# Patient Record
Sex: Female | Born: 1945 | Race: White | Hispanic: No | Marital: Married | Smoking: Never smoker
Health system: Southern US, Community
[De-identification: ages and names within clinical notes are randomized; demographics above are authoritative.]

## PROBLEM LIST (undated history)

## (undated) DIAGNOSIS — C50919 Malignant neoplasm of unspecified site of unspecified female breast: Secondary | ICD-10-CM

## (undated) DIAGNOSIS — Z803 Family history of malignant neoplasm of breast: Secondary | ICD-10-CM

## (undated) DIAGNOSIS — K219 Gastro-esophageal reflux disease without esophagitis: Secondary | ICD-10-CM

## (undated) DIAGNOSIS — M25569 Pain in unspecified knee: Secondary | ICD-10-CM

## (undated) DIAGNOSIS — Z8042 Family history of malignant neoplasm of prostate: Secondary | ICD-10-CM

## (undated) DIAGNOSIS — E785 Hyperlipidemia, unspecified: Secondary | ICD-10-CM

## (undated) DIAGNOSIS — E039 Hypothyroidism, unspecified: Secondary | ICD-10-CM

## (undated) DIAGNOSIS — C801 Malignant (primary) neoplasm, unspecified: Secondary | ICD-10-CM

## (undated) DIAGNOSIS — E559 Vitamin D deficiency, unspecified: Secondary | ICD-10-CM

## (undated) DIAGNOSIS — C4491 Basal cell carcinoma of skin, unspecified: Secondary | ICD-10-CM

## (undated) DIAGNOSIS — H269 Unspecified cataract: Secondary | ICD-10-CM

## (undated) DIAGNOSIS — D649 Anemia, unspecified: Secondary | ICD-10-CM

## (undated) DIAGNOSIS — M545 Low back pain: Secondary | ICD-10-CM

## (undated) DIAGNOSIS — C439 Malignant melanoma of skin, unspecified: Secondary | ICD-10-CM

## (undated) DIAGNOSIS — E079 Disorder of thyroid, unspecified: Secondary | ICD-10-CM

## (undated) DIAGNOSIS — Z0000 Encounter for general adult medical examination without abnormal findings: Secondary | ICD-10-CM

## (undated) DIAGNOSIS — R112 Nausea with vomiting, unspecified: Secondary | ICD-10-CM

## (undated) DIAGNOSIS — T7840XA Allergy, unspecified, initial encounter: Secondary | ICD-10-CM

## (undated) DIAGNOSIS — Z8052 Family history of malignant neoplasm of bladder: Secondary | ICD-10-CM

## (undated) DIAGNOSIS — M5030 Other cervical disc degeneration, unspecified cervical region: Secondary | ICD-10-CM

## (undated) DIAGNOSIS — Z9889 Other specified postprocedural states: Secondary | ICD-10-CM

## (undated) DIAGNOSIS — Z800 Family history of malignant neoplasm of digestive organs: Secondary | ICD-10-CM

## (undated) DIAGNOSIS — R739 Hyperglycemia, unspecified: Secondary | ICD-10-CM

## (undated) DIAGNOSIS — Z8041 Family history of malignant neoplasm of ovary: Secondary | ICD-10-CM

## (undated) DIAGNOSIS — I10 Essential (primary) hypertension: Principal | ICD-10-CM

## (undated) DIAGNOSIS — Z124 Encounter for screening for malignant neoplasm of cervix: Secondary | ICD-10-CM

## (undated) HISTORY — DX: Family history of malignant neoplasm of digestive organs: Z80.0

## (undated) HISTORY — DX: Encounter for general adult medical examination without abnormal findings: Z00.00

## (undated) HISTORY — DX: Malignant melanoma of skin, unspecified: C43.9

## (undated) HISTORY — DX: Anemia, unspecified: D64.9

## (undated) HISTORY — DX: Family history of malignant neoplasm of prostate: Z80.42

## (undated) HISTORY — DX: Hyperlipidemia, unspecified: E78.5

## (undated) HISTORY — DX: Disorder of thyroid, unspecified: E07.9

## (undated) HISTORY — DX: Malignant neoplasm of unspecified site of unspecified female breast: C50.919

## (undated) HISTORY — DX: Pain in unspecified knee: M25.569

## (undated) HISTORY — DX: Family history of malignant neoplasm of ovary: Z80.41

## (undated) HISTORY — DX: Other cervical disc degeneration, unspecified cervical region: M50.30

## (undated) HISTORY — DX: Low back pain: M54.5

## (undated) HISTORY — DX: Malignant (primary) neoplasm, unspecified: C80.1

## (undated) HISTORY — DX: Family history of malignant neoplasm of breast: Z80.3

## (undated) HISTORY — DX: Family history of malignant neoplasm of bladder: Z80.52

## (undated) HISTORY — DX: Allergy, unspecified, initial encounter: T78.40XA

## (undated) HISTORY — DX: Hyperglycemia, unspecified: R73.9

## (undated) HISTORY — DX: Encounter for screening for malignant neoplasm of cervix: Z12.4

## (undated) HISTORY — DX: Essential (primary) hypertension: I10

## (undated) HISTORY — DX: Vitamin D deficiency, unspecified: E55.9

## (undated) HISTORY — DX: Unspecified cataract: H26.9

## (undated) HISTORY — DX: Basal cell carcinoma of skin, unspecified: C44.91

## (undated) HISTORY — DX: Gastro-esophageal reflux disease without esophagitis: K21.9

## (undated) HISTORY — PX: COLONOSCOPY: SHX174

## (undated) HISTORY — PX: BASAL CELL CARCINOMA EXCISION: SHX1214

## (undated) HISTORY — PX: ABDOMINAL HYSTERECTOMY: SHX81

## (undated) HISTORY — PX: BREAST SURGERY: SHX581

---
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
## 2000-07-18 ENCOUNTER — Other Ambulatory Visit: Admission: RE | Admit: 2000-07-18 | Discharge: 2000-07-18 | Payer: Self-pay | Admitting: Obstetrics and Gynecology

## 2000-10-12 ENCOUNTER — Other Ambulatory Visit: Admission: RE | Admit: 2000-10-12 | Discharge: 2000-10-12 | Payer: Self-pay | Admitting: Obstetrics and Gynecology

## 2000-11-18 ENCOUNTER — Other Ambulatory Visit: Admission: RE | Admit: 2000-11-18 | Discharge: 2000-11-18 | Payer: Self-pay | Admitting: Obstetrics and Gynecology

## 2001-02-14 ENCOUNTER — Other Ambulatory Visit: Admission: RE | Admit: 2001-02-14 | Discharge: 2001-02-14 | Payer: Self-pay | Admitting: Obstetrics and Gynecology

## 2001-08-01 ENCOUNTER — Other Ambulatory Visit: Admission: RE | Admit: 2001-08-01 | Discharge: 2001-08-01 | Payer: Self-pay | Admitting: Obstetrics and Gynecology

## 2001-11-21 ENCOUNTER — Other Ambulatory Visit: Admission: RE | Admit: 2001-11-21 | Discharge: 2001-11-21 | Payer: Self-pay | Admitting: Obstetrics and Gynecology

## 2002-02-22 ENCOUNTER — Other Ambulatory Visit: Admission: RE | Admit: 2002-02-22 | Discharge: 2002-02-22 | Payer: Self-pay | Admitting: Obstetrics and Gynecology

## 2002-10-11 ENCOUNTER — Other Ambulatory Visit: Admission: RE | Admit: 2002-10-11 | Discharge: 2002-10-11 | Payer: Self-pay | Admitting: Obstetrics and Gynecology

## 2004-11-18 ENCOUNTER — Other Ambulatory Visit: Admission: RE | Admit: 2004-11-18 | Discharge: 2004-11-18 | Payer: Self-pay | Admitting: Obstetrics and Gynecology

## 2005-11-08 DIAGNOSIS — C801 Malignant (primary) neoplasm, unspecified: Secondary | ICD-10-CM | POA: Insufficient documentation

## 2005-11-08 HISTORY — DX: Malignant (primary) neoplasm, unspecified: C80.1

## 2005-11-08 HISTORY — PX: MELANOMA EXCISION: SHX5266

## 2005-11-08 LAB — HM COLONOSCOPY: HM Colonoscopy: NORMAL

## 2006-08-10 ENCOUNTER — Other Ambulatory Visit: Admission: RE | Admit: 2006-08-10 | Discharge: 2006-08-10 | Payer: Self-pay | Admitting: Obstetrics and Gynecology

## 2006-09-05 ENCOUNTER — Ambulatory Visit: Payer: Self-pay | Admitting: Internal Medicine

## 2006-09-19 ENCOUNTER — Ambulatory Visit: Payer: Self-pay | Admitting: Internal Medicine

## 2009-11-08 LAB — HM PAP SMEAR: HM Pap smear: NORMAL

## 2012-03-13 LAB — HM MAMMOGRAPHY: HM Mammogram: NORMAL

## 2012-03-30 ENCOUNTER — Encounter: Payer: Self-pay | Admitting: Family Medicine

## 2012-03-30 ENCOUNTER — Ambulatory Visit (INDEPENDENT_AMBULATORY_CARE_PROVIDER_SITE_OTHER): Payer: BC Managed Care – PPO | Admitting: Family Medicine

## 2012-03-30 VITALS — BP 146/86 | HR 95 | Temp 98.0°F | Ht 60.75 in | Wt 138.8 lb

## 2012-03-30 DIAGNOSIS — K219 Gastro-esophageal reflux disease without esophagitis: Secondary | ICD-10-CM | POA: Insufficient documentation

## 2012-03-30 DIAGNOSIS — M25569 Pain in unspecified knee: Secondary | ICD-10-CM | POA: Insufficient documentation

## 2012-03-30 DIAGNOSIS — Z0000 Encounter for general adult medical examination without abnormal findings: Secondary | ICD-10-CM | POA: Insufficient documentation

## 2012-03-30 DIAGNOSIS — D649 Anemia, unspecified: Secondary | ICD-10-CM | POA: Insufficient documentation

## 2012-03-30 DIAGNOSIS — I10 Essential (primary) hypertension: Secondary | ICD-10-CM | POA: Insufficient documentation

## 2012-03-30 DIAGNOSIS — C801 Malignant (primary) neoplasm, unspecified: Secondary | ICD-10-CM | POA: Insufficient documentation

## 2012-03-30 DIAGNOSIS — E079 Disorder of thyroid, unspecified: Secondary | ICD-10-CM

## 2012-03-30 DIAGNOSIS — E785 Hyperlipidemia, unspecified: Secondary | ICD-10-CM

## 2012-03-30 DIAGNOSIS — T7840XA Allergy, unspecified, initial encounter: Secondary | ICD-10-CM | POA: Insufficient documentation

## 2012-03-30 HISTORY — DX: Pain in unspecified knee: M25.569

## 2012-03-30 NOTE — Progress Notes (Signed)
Patient ID: Tammy Grimes, female   DOB: 1945/12/09, 66 y.o.   MRN: [id number] Tammy Grimes [id number] 1946-05-21 03/30/2012      Progress Note New Patient  Subjective  Chief Complaint  Chief Complaint  Patient presents with  . Establish Care    new patient    HPI  Patient is a 66 year old Caucasian female who is in today for new patient appointment. 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 She notes blood pressures have been in the 120 130/70-90 range. She did have an eye infection in her lid earlier dysuria but that is resolved it was in the right eye. 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 She had colonoscopy back in 2007. No recent illness, fevers, chills, chest pain, palpitations, shortness of breath, GI or GU complaints are noted at today's visit.  Past Medical History  Diagnosis Date  . GERD (gastroesophageal reflux disease)   . Hypertension 66  . Cancer 66    calf of right leg- melanoma  . Anemia     prior to hysterectomy  . Allergy   . Hyperlipidemia   . Thyroid disease 66  . Preventative health care 03/30/2012  . Knee pain 03/30/2012    L>R    Past Surgical History  Procedure Date  . Breast surgery early 70's    fibroid tumors removed- benign  . Abdominal hysterectomy 1990's    total, for heavy bleeding and fibroids    Family History  Problem Relation Age of Onset  . Hypertension Mother   . Alzheimer's disease Mother   . Dementia Mother     alzheimer's  . Aortic aneurysm Father   . Hypertension Father   . COPD Father     smoker  . Heart disease Father     s/p bypass, aortic aneurysm, carotid artery disease  . Cancer Sister 33    breast- remission  . Diabetes Son 22    type 1  . Alzheimer's disease Maternal Grandmother   . Cancer Maternal Grandfather     prostate  . Stroke Paternal Grandfather     History   Social History  . Marital  Status: Married    Spouse Name: N/A    Number of Children: N/A  . Years of Education: N/A   Occupational History  . Not on file.   Social History Main Topics  . Smoking status: Never Smoker   . Smokeless tobacco: Never Used  . Alcohol Use: No  . Drug Use: No  . Sexually Active: Not Currently   Other Topics Concern  . Not on file   Social History Narrative  . No narrative on file    Current Outpatient Prescriptions on File Prior to Visit  Medication Sig Dispense Refill  . amLODipine (NORVASC) 5 MG tablet Take 5 mg by mouth daily.      Marland Kitchen esomeprazole (NEXIUM) 40 MG capsule Take 40 mg by mouth daily before breakfast.      . levothyroxine (SYNTHROID, LEVOTHROID) 75 MCG tablet Take 75 mcg by mouth daily.        Allergies  Allergen Reactions  . Penicillins Rash    Review of Systems  Review of Systems  Constitutional: Negative for fever and malaise/fatigue.  HENT: Negative for congestion.   Eyes: Negative for discharge.  Respiratory: Negative for shortness of breath.   Cardiovascular: Negative for chest pain,  palpitations and leg swelling.  Gastrointestinal: Negative for nausea, abdominal pain and diarrhea.  Genitourinary: Negative for dysuria.  Musculoskeletal: Positive for joint pain. Negative for falls.       Knee pain  Skin: Negative for rash.  Neurological: Negative for loss of consciousness and headaches.  Endo/Heme/Allergies: Negative for polydipsia.  Psychiatric/Behavioral: Negative for depression and suicidal ideas. The patient is not nervous/anxious and does not have insomnia.     Objective  BP 146/86  Pulse 95  Temp(Src) 98 F (36.7 C) (Temporal)  Ht 5' 0.75" (1.543 m)  Wt 138 lb 12.8 oz (62.959 kg)  BMI 26.44 kg/m2  SpO2 97%  Physical Exam  Physical Exam  Constitutional: She is oriented to person, place, and time and well-developed, well-nourished, and in no distress. No distress.  HENT:  Head: Normocephalic and atraumatic.  Eyes: Conjunctivae  are normal.  Neck: Neck supple. No thyromegaly present.  Cardiovascular: Normal rate, regular rhythm and normal heart sounds.   No murmur heard. Pulmonary/Chest: Effort normal and breath sounds normal. She has no wheezes.  Abdominal: She exhibits no distension and no mass.  Musculoskeletal: She exhibits no edema.  Lymphadenopathy:    She has no cervical adenopathy.  Neurological: She is alert and oriented to person, place, and time.  Skin: Skin is warm and dry. No rash noted. She is not diaphoretic.  Psychiatric: Memory, affect and judgment normal.       Assessment & Plan  Hypertension improved on repeat, given handout on DASH diet, encouraged minimal sodium and reassess at next visit.  GERD (gastroesophageal reflux disease) Well controlled on Nexium. Avoid offending foods.  Thyroid disease On Synthroid, has her lab work done at her place of employment. She agrees to supply a copy of her most recent labs so we can further evaluate.  Preventative health care Patient agrees to return for gyn visit in 2 months, encouraged to maintain adequate sleep, exercise and a heart  Healthy diet  Hyperlipidemia Check labs from patient's work place. Encouraged MegaRed daily  Cancer Follows with dermatology  Knee pain enouraged Naproxen 220 mg daily and Aspercreme bid. Call if worsens and wants a referral.

## 2012-03-30 NOTE — Patient Instructions (Addendum)
Preventive Care for Adults, Female A healthy lifestyle and preventive care can promote health and wellness. Preventive health guidelines for women include the following key practices.  A routine yearly physical is a good way to check with your caregiver about your health and preventive screening. It is a chance to share any concerns and updates on your health, and to receive a thorough exam.   Visit your dentist for a routine exam and preventive care every 6 months. Brush your teeth twice a day and floss once a day. Good oral hygiene prevents tooth decay and gum disease.   The frequency of eye exams is based on your age, health, family medical history, use of contact lenses, and other factors. Follow your caregiver's recommendations for frequency of eye exams.   Eat a healthy diet. Foods like vegetables, fruits, whole grains, low-fat dairy products, and lean protein foods contain the nutrients you need without too many calories. Decrease your intake of foods high in solid fats, added sugars, and salt. Eat the right amount of calories for you.Get information about a proper diet from your caregiver, if necessary.   Regular physical exercise is one of the most important things you can do for your health. Most adults should get at least 150 minutes of moderate-intensity exercise (any activity that increases your heart rate and causes you to sweat) each week. In addition, most adults need muscle-strengthening exercises on 2 or more days a week.   Maintain a healthy weight. The body mass index (BMI) is a screening tool to identify possible weight problems. It provides an estimate of body fat based on height and weight. Your caregiver can help determine your BMI, and can help you achieve or maintain a healthy weight.For adults 20 years and older:   A BMI below 18.5 is considered underweight.   A BMI of 18.5 to 24.9 is normal.   A BMI of 25 to 29.9 is considered overweight.   A BMI of 30 and above is  considered obese.   Maintain normal blood lipids and cholesterol levels by exercising and minimizing your intake of saturated fat. Eat a balanced diet with plenty of fruit and vegetables. Blood tests for lipids and cholesterol should begin at age 20 and be repeated every 5 years. If your lipid or cholesterol levels are high, you are over 50, or you are at high risk for heart disease, you may need your cholesterol levels checked more frequently.Ongoing high lipid and cholesterol levels should be treated with medicines if diet and exercise are not effective.   If you smoke, find out from your caregiver how to quit. If you do not use tobacco, do not start.   If you are pregnant, do not drink alcohol. If you are breastfeeding, be very cautious about drinking alcohol. If you are not pregnant and choose to drink alcohol, do not exceed 1 drink per day. One drink is considered to be 12 ounces (355 mL) of beer, 5 ounces (148 mL) of wine, or 1.5 ounces (44 mL) of liquor.   Avoid use of street drugs. Do not share needles with anyone. Ask for help if you need support or instructions about stopping the use of drugs.   High blood pressure causes heart disease and increases the risk of stroke. Your blood pressure should be checked at least every 1 to 2 years. Ongoing high blood pressure should be treated with medicines if weight loss and exercise are not effective.   If you are 55 to 64   years old, ask your caregiver if you should take aspirin to prevent strokes.   Diabetes screening involves taking a blood sample to check your fasting blood sugar level. This should be done once every 3 years, after age 45, if you are within normal weight and without risk factors for diabetes. Testing should be considered at a younger age or be carried out more frequently if you are overweight and have at least 1 risk factor for diabetes.   Breast cancer screening is essential preventive care for women. You should practice "breast  self-awareness." This means understanding the normal appearance and feel of your breasts and may include breast self-examination. Any changes detected, no matter how small, should be reported to a caregiver. Women in their 20s and 30s should have a clinical breast exam (CBE) by a caregiver as part of a regular health exam every 1 to 3 years. After age 40, women should have a CBE every year. Starting at age 40, women should consider having a mammography (breast X-ray test) every year. Women who have a family history of breast cancer should talk to their caregiver about genetic screening. Women at a high risk of breast cancer should talk to their caregivers about having magnetic resonance imaging (MRI) and a mammography every year.   The Pap test is a screening test for cervical cancer. A Pap test can show cell changes on the cervix that might become cervical cancer if left untreated. A Pap test is a procedure in which cells are obtained and examined from the lower end of the uterus (cervix).   Women should have a Pap test starting at age 21.   Between ages 21 and 29, Pap tests should be repeated every 2 years.   Beginning at age 30, you should have a Pap test every 3 years as long as the past 3 Pap tests have been normal.   Some women have medical problems that increase the chance of getting cervical cancer. Talk to your caregiver about these problems. It is especially important to talk to your caregiver if a new problem develops soon after your last Pap test. In these cases, your caregiver may recommend more frequent screening and Pap tests.   The above recommendations are the same for women who have or have not gotten the vaccine for human papillomavirus (HPV).   If you had a hysterectomy for a problem that was not cancer or a condition that could lead to cancer, then you no longer need Pap tests. Even if you no longer need a Pap test, a regular exam is a good idea to make sure no other problems are  starting.   If you are between ages 65 and 70, and you have had normal Pap tests going back 10 years, you no longer need Pap tests. Even if you no longer need a Pap test, a regular exam is a good idea to make sure no other problems are starting.   If you have had past treatment for cervical cancer or a condition that could lead to cancer, you need Pap tests and screening for cancer for at least 20 years after your treatment.   If Pap tests have been discontinued, risk factors (such as a new sexual partner) need to be reassessed to determine if screening should be resumed.   The HPV test is an additional test that may be used for cervical cancer screening. The HPV test looks for the virus that can cause the cell changes on the cervix.   The cells collected during the Pap test can be tested for HPV. The HPV test could be used to screen women aged 30 years and older, and should be used in women of any age who have unclear Pap test results. After the age of 30, women should have HPV testing at the same frequency as a Pap test.   Colorectal cancer can be detected and often prevented. Most routine colorectal cancer screening begins at the age of 50 and continues through age 75. However, your caregiver may recommend screening at an earlier age if you have risk factors for colon cancer. On a yearly basis, your caregiver may provide home test kits to check for hidden blood in the stool. Use of a small camera at the end of a tube, to directly examine the colon (sigmoidoscopy or colonoscopy), can detect the earliest forms of colorectal cancer. Talk to your caregiver about this at age 50, when routine screening begins. Direct examination of the colon should be repeated every 5 to 10 years through age 75, unless early forms of pre-cancerous polyps or small growths are found.   Hepatitis C blood testing is recommended for all people born from 1945 through 1965 and any individual with known risks for hepatitis C.    Practice safe sex. Use condoms and avoid high-risk sexual practices to reduce the spread of sexually transmitted infections (STIs). STIs include gonorrhea, chlamydia, syphilis, trichomonas, herpes, HPV, and human immunodeficiency virus (HIV). Herpes, HIV, and HPV are viral illnesses that have no cure. They can result in disability, cancer, and death. Sexually active women aged 25 and younger should be checked for chlamydia. Older women with new or multiple partners should also be tested for chlamydia. Testing for other STIs is recommended if you are sexually active and at increased risk.   Osteoporosis is a disease in which the bones lose minerals and strength with aging. This can result in serious bone fractures. The risk of osteoporosis can be identified using a bone density scan. Women ages 65 and over and women at risk for fractures or osteoporosis should discuss screening with their caregivers. Ask your caregiver whether you should take a calcium supplement or vitamin D to reduce the rate of osteoporosis.   Menopause can be associated with physical symptoms and risks. Hormone replacement therapy is available to decrease symptoms and risks. You should talk to your caregiver about whether hormone replacement therapy is right for you.   Use sunscreen with sun protection factor (SPF) of 30 or more. Apply sunscreen liberally and repeatedly throughout the day. You should seek shade when your shadow is shorter than you. Protect yourself by wearing long sleeves, pants, a wide-brimmed hat, and sunglasses year round, whenever you are outdoors.   Once a month, do a whole body skin exam, using a mirror to look at the skin on your back. Notify your caregiver of new moles, moles that have irregular borders, moles that are larger than a pencil eraser, or moles that have changed in shape or color.   Stay current with required immunizations.   Influenza. You need a dose every fall (or winter). The composition of  the flu vaccine changes each year, so being vaccinated once is not enough.   Pneumococcal polysaccharide. You need 1 to 2 doses if you smoke cigarettes or if you have certain chronic medical conditions. You need 1 dose at age 65 (or older) if you have never been vaccinated.   Tetanus, diphtheria, pertussis (Tdap, Td). Get 1 dose of   Tdap vaccine if you are younger than age 65, are over 65 and have contact with an infant, are a healthcare worker, are pregnant, or simply want to be protected from whooping cough. After that, you need a Td booster dose every 10 years. Consult your caregiver if you have not had at least 3 tetanus and diphtheria-containing shots sometime in your life or have a deep or dirty wound.   HPV. You need this vaccine if you are a woman age 26 or younger. The vaccine is given in 3 doses over 6 months.   Measles, mumps, rubella (MMR). You need at least 1 dose of MMR if you were born in 1957 or later. You may also need a second dose.   Meningococcal. If you are age 19 to 21 and a first-year college student living in a residence hall, or have one of several medical conditions, you need to get vaccinated against meningococcal disease. You may also need additional booster doses.   Zoster (shingles). If you are age 60 or older, you should get this vaccine.   Varicella (chickenpox). If you have never had chickenpox or you were vaccinated but received only 1 dose, talk to your caregiver to find out if you need this vaccine.   Hepatitis A. You need this vaccine if you have a specific risk factor for hepatitis A virus infection or you simply wish to be protected from this disease. The vaccine is usually given as 2 doses, 6 to 18 months apart.   Hepatitis B. You need this vaccine if you have a specific risk factor for hepatitis B virus infection or you simply wish to be protected from this disease. The vaccine is given in 3 doses, usually over 6 months.  Preventive Services /  Frequency Ages 19 to 39  Blood pressure check.** / Every 1 to 2 years.   Lipid and cholesterol check.** / Every 5 years beginning at age 20.   Clinical breast exam.** / Every 3 years for women in their 20s and 30s.   Pap test.** / Every 2 years from ages 21 through 29. Every 3 years starting at age 30 through age 65 or 70 with a history of 3 consecutive normal Pap tests.   HPV screening.** / Every 3 years from ages 30 through ages 65 to 70 with a history of 3 consecutive normal Pap tests.   Hepatitis C blood test.** / For any individual with known risks for hepatitis C.   Skin self-exam. / Monthly.   Influenza immunization.** / Every year.   Pneumococcal polysaccharide immunization.** / 1 to 2 doses if you smoke cigarettes or if you have certain chronic medical conditions.   Tetanus, diphtheria, pertussis (Tdap, Td) immunization. / A one-time dose of Tdap vaccine. After that, you need a Td booster dose every 10 years.   HPV immunization. / 3 doses over 6 months, if you are 26 and younger.   Measles, mumps, rubella (MMR) immunization. / You need at least 1 dose of MMR if you were born in 1957 or later. You may also need a second dose.   Meningococcal immunization. / 1 dose if you are age 19 to 21 and a first-year college student living in a residence hall, or have one of several medical conditions, you need to get vaccinated against meningococcal disease. You may also need additional booster doses.   Varicella immunization.** / Consult your caregiver.   Hepatitis A immunization.** / Consult your caregiver. 2 doses, 6 to 18 months   apart.   Hepatitis B immunization.** / Consult your caregiver. 3 doses usually over 6 months.  Ages 40 to 64  Blood pressure check.** / Every 1 to 2 years.   Lipid and cholesterol check.** / Every 5 years beginning at age 20.   Clinical breast exam.** / Every year after age 40.   Mammogram.** / Every year beginning at age 40 and continuing for as  long as you are in good health. Consult with your caregiver.   Pap test.** / Every 3 years starting at age 30 through age 65 or 70 with a history of 3 consecutive normal Pap tests.   HPV screening.** / Every 3 years from ages 30 through ages 65 to 70 with a history of 3 consecutive normal Pap tests.   Fecal occult blood test (FOBT) of stool. / Every year beginning at age 50 and continuing until age 75. You may not need to do this test if you get a colonoscopy every 10 years.   Flexible sigmoidoscopy or colonoscopy.** / Every 5 years for a flexible sigmoidoscopy or every 10 years for a colonoscopy beginning at age 50 and continuing until age 75.   Hepatitis C blood test.** / For all people born from 1945 through 1965 and any individual with known risks for hepatitis C.   Skin self-exam. / Monthly.   Influenza immunization.** / Every year.   Pneumococcal polysaccharide immunization.** / 1 to 2 doses if you smoke cigarettes or if you have certain chronic medical conditions.   Tetanus, diphtheria, pertussis (Tdap, Td) immunization.** / A one-time dose of Tdap vaccine. After that, you need a Td booster dose every 10 years.   Measles, mumps, rubella (MMR) immunization. / You need at least 1 dose of MMR if you were born in 1957 or later. You may also need a second dose.   Varicella immunization.** / Consult your caregiver.   Meningococcal immunization.** / Consult your caregiver.   Hepatitis A immunization.** / Consult your caregiver. 2 doses, 6 to 18 months apart.   Hepatitis B immunization.** / Consult your caregiver. 3 doses, usually over 6 months.  Ages 65 and over  Blood pressure check.** / Every 1 to 2 years.   Lipid and cholesterol check.** / Every 5 years beginning at age 20.   Clinical breast exam.** / Every year after age 40.   Mammogram.** / Every year beginning at age 40 and continuing for as long as you are in good health. Consult with your caregiver.   Pap test.** /  Every 3 years starting at age 30 through age 65 or 70 with a 3 consecutive normal Pap tests. Testing can be stopped between 65 and 70 with 3 consecutive normal Pap tests and no abnormal Pap or HPV tests in the past 10 years.   HPV screening.** / Every 3 years from ages 30 through ages 65 or 70 with a history of 3 consecutive normal Pap tests. Testing can be stopped between 65 and 70 with 3 consecutive normal Pap tests and no abnormal Pap or HPV tests in the past 10 years.   Fecal occult blood test (FOBT) of stool. / Every year beginning at age 50 and continuing until age 75. You may not need to do this test if you get a colonoscopy every 10 years.   Flexible sigmoidoscopy or colonoscopy.** / Every 5 years for a flexible sigmoidoscopy or every 10 years for a colonoscopy beginning at age 50 and continuing until age 75.   Hepatitis   C blood test.** / For all people born from [birth year] through 1965 and any individual with known risks for hepatitis C.   Osteoporosis screening.** / A one-time screening for women ages 37 and over and women at risk for fractures or osteoporosis.   Skin self-exam. / Monthly.   Influenza immunization.** / Every year.   Pneumococcal polysaccharide immunization.** / 1 dose at age 80 (or older) if you have never been vaccinated.   Tetanus, diphtheria, pertussis (Tdap, Td) immunization. / A one-time dose of Tdap vaccine if you are over 65 and have contact with an infant, are a Research scientist (physical sciences), or simply want to be protected from whooping cough. After that, you need a Td booster dose every 10 years.   Varicella immunization.** / Consult your caregiver.   Meningococcal immunization.** / Consult your caregiver.   Hepatitis A immunization.** / Consult your caregiver. 2 doses, 6 to 18 months apart.   Hepatitis B immunization.** / Check with your caregiver. 3 doses, usually over 6 months.  ** Family history and personal history of risk and conditions may change your caregiver's  recommendations. Document Released: 12/21/2001 Document Revised: 10/14/2011 Document Reviewed: 03/22/2011 [facility] Patient Information 2012 [place], [place]. Start MegaRed caps daily, it is a Scientist, forensic by [employer] and/or Aspercreme for pain

## 2012-03-30 NOTE — Assessment & Plan Note (Signed)
Well controlled on Nexium. Avoid offending foods.

## 2012-03-30 NOTE — Assessment & Plan Note (Signed)
Follows with dermatology 

## 2012-03-30 NOTE — Assessment & Plan Note (Signed)
enouraged Naproxen 220 mg daily and Aspercreme bid. Call if worsens and wants a referral.

## 2012-03-30 NOTE — Assessment & Plan Note (Signed)
Check labs from patient's work place. Encouraged MegaRed daily

## 2012-03-30 NOTE — Assessment & Plan Note (Signed)
improved on repeat, given handout on DASH diet, encouraged minimal sodium and reassess at next visit.

## 2012-03-30 NOTE — Assessment & Plan Note (Signed)
On Synthroid, has her lab work done at her place of employment. She agrees to supply a copy of her most recent labs so we can further evaluate.

## 2012-03-30 NOTE — Assessment & Plan Note (Signed)
Patient agrees to return for gyn visit in 2 months, encouraged to maintain adequate sleep, exercise and a heart  Healthy diet

## 2012-05-30 ENCOUNTER — Ambulatory Visit: Payer: BC Managed Care – PPO | Admitting: Family Medicine

## 2012-06-06 ENCOUNTER — Telehealth: Payer: Self-pay | Admitting: Family Medicine

## 2012-06-06 ENCOUNTER — Other Ambulatory Visit (HOSPITAL_COMMUNITY)
Admission: RE | Admit: 2012-06-06 | Discharge: 2012-06-06 | Disposition: A | Discharge status: Home / Self Care | Payer: BC Managed Care – PPO | Source: Ambulatory Visit | Attending: Family Medicine | Admitting: Family Medicine

## 2012-06-06 ENCOUNTER — Ambulatory Visit (INDEPENDENT_AMBULATORY_CARE_PROVIDER_SITE_OTHER): Payer: BC Managed Care – PPO | Admitting: Family Medicine

## 2012-06-06 ENCOUNTER — Encounter: Payer: Self-pay | Admitting: Family Medicine

## 2012-06-06 VITALS — BP 138/82 | HR 84 | Temp 97.0°F | Ht 60.75 in | Wt 143.1 lb

## 2012-06-06 DIAGNOSIS — M8580 Other specified disorders of bone density and structure, unspecified site: Secondary | ICD-10-CM

## 2012-06-06 DIAGNOSIS — M899 Disorder of bone, unspecified: Secondary | ICD-10-CM

## 2012-06-06 DIAGNOSIS — E559 Vitamin D deficiency, unspecified: Secondary | ICD-10-CM | POA: Insufficient documentation

## 2012-06-06 DIAGNOSIS — Z23 Encounter for immunization: Secondary | ICD-10-CM

## 2012-06-06 DIAGNOSIS — E785 Hyperlipidemia, unspecified: Secondary | ICD-10-CM

## 2012-06-06 DIAGNOSIS — Z0000 Encounter for general adult medical examination without abnormal findings: Secondary | ICD-10-CM

## 2012-06-06 DIAGNOSIS — Z124 Encounter for screening for malignant neoplasm of cervix: Secondary | ICD-10-CM

## 2012-06-06 DIAGNOSIS — E079 Disorder of thyroid, unspecified: Secondary | ICD-10-CM

## 2012-06-06 DIAGNOSIS — I10 Essential (primary) hypertension: Secondary | ICD-10-CM

## 2012-06-06 DIAGNOSIS — M810 Age-related osteoporosis without current pathological fracture: Secondary | ICD-10-CM | POA: Insufficient documentation

## 2012-06-06 DIAGNOSIS — R9431 Abnormal electrocardiogram [ECG] [EKG]: Secondary | ICD-10-CM

## 2012-06-06 DIAGNOSIS — Z01419 Encounter for gynecological examination (general) (routine) without abnormal findings: Secondary | ICD-10-CM | POA: Insufficient documentation

## 2012-06-06 HISTORY — DX: Vitamin D deficiency, unspecified: E55.9

## 2012-06-06 HISTORY — DX: Encounter for screening for malignant neoplasm of cervix: Z12.4

## 2012-06-06 MED ORDER — ZOSTER VACCINE LIVE 19400 UNT/0.65ML ~~LOC~~ SOLR: 0.6500 mL | Freq: Once | SUBCUTANEOUS | Status: DC

## 2012-06-06 NOTE — Patient Instructions (Addendum)
Preventive Care for Adults, Female A healthy lifestyle and preventive care can promote health and wellness. Preventive health guidelines for women include the following key practices.  A routine yearly physical is a good way to check with your caregiver about your health and preventive screening. It is a chance to share any concerns and updates on your health, and to receive a thorough exam.   Visit your dentist for a routine exam and preventive care every 6 months. Brush your teeth twice a day and floss once a day. Good oral hygiene prevents tooth decay and gum disease.   The frequency of eye exams is based on your age, health, family medical history, use of contact lenses, and other factors. Follow your caregiver's recommendations for frequency of eye exams.   Eat a healthy diet. Foods like vegetables, fruits, whole grains, low-fat dairy products, and lean protein foods contain the nutrients you need without too many calories. Decrease your intake of foods high in solid fats, added sugars, and salt. Eat the right amount of calories for you.Get information about a proper diet from your caregiver, if necessary.   Regular physical exercise is one of the most important things you can do for your health. Most adults should get at least 150 minutes of moderate-intensity exercise (any activity that increases your heart rate and causes you to sweat) each week. In addition, most adults need muscle-strengthening exercises on 2 or more days a week.   Maintain a healthy weight. The body mass index (BMI) is a screening tool to identify possible weight problems. It provides an estimate of body fat based on height and weight. Your caregiver can help determine your BMI, and can help you achieve or maintain a healthy weight.For adults 20 years and older:   A BMI below 18.5 is considered underweight.   A BMI of 18.5 to 24.9 is normal.   A BMI of 25 to 29.9 is considered overweight.   A BMI of 30 and above is  considered obese.   Maintain normal blood lipids and cholesterol levels by exercising and minimizing your intake of saturated fat. Eat a balanced diet with plenty of fruit and vegetables. Blood tests for lipids and cholesterol should begin at age 20 and be repeated every 5 years. If your lipid or cholesterol levels are high, you are over 50, or you are at high risk for heart disease, you may need your cholesterol levels checked more frequently.Ongoing high lipid and cholesterol levels should be treated with medicines if diet and exercise are not effective.   If you smoke, find out from your caregiver how to quit. If you do not use tobacco, do not start.   If you are pregnant, do not drink alcohol. If you are breastfeeding, be very cautious about drinking alcohol. If you are not pregnant and choose to drink alcohol, do not exceed 1 drink per day. One drink is considered to be 12 ounces (355 mL) of beer, 5 ounces (148 mL) of wine, or 1.5 ounces (44 mL) of liquor.   Avoid use of street drugs. Do not share needles with anyone. Ask for help if you need support or instructions about stopping the use of drugs.   High blood pressure causes heart disease and increases the risk of stroke. Your blood pressure should be checked at least every 1 to 2 years. Ongoing high blood pressure should be treated with medicines if weight loss and exercise are not effective.   If you are 55 to 64   years old, ask your caregiver if you should take aspirin to prevent strokes.   Diabetes screening involves taking a blood sample to check your fasting blood sugar level. This should be done once every 3 years, after age 45, if you are within normal weight and without risk factors for diabetes. Testing should be considered at a younger age or be carried out more frequently if you are overweight and have at least 1 risk factor for diabetes.   Breast cancer screening is essential preventive care for women. You should practice "breast  self-awareness." This means understanding the normal appearance and feel of your breasts and may include breast self-examination. Any changes detected, no matter how small, should be reported to a caregiver. Women in their 20s and 30s should have a clinical breast exam (CBE) by a caregiver as part of a regular health exam every 1 to 3 years. After age 40, women should have a CBE every year. Starting at age 40, women should consider having a mammography (breast X-ray test) every year. Women who have a family history of breast cancer should talk to their caregiver about genetic screening. Women at a high risk of breast cancer should talk to their caregivers about having magnetic resonance imaging (MRI) and a mammography every year.   The Pap test is a screening test for cervical cancer. A Pap test can show cell changes on the cervix that might become cervical cancer if left untreated. A Pap test is a procedure in which cells are obtained and examined from the lower end of the uterus (cervix).   Women should have a Pap test starting at age 21.   Between ages 21 and 29, Pap tests should be repeated every 2 years.   Beginning at age 30, you should have a Pap test every 3 years as long as the past 3 Pap tests have been normal.   Some women have medical problems that increase the chance of getting cervical cancer. Talk to your caregiver about these problems. It is especially important to talk to your caregiver if a new problem develops soon after your last Pap test. In these cases, your caregiver may recommend more frequent screening and Pap tests.   The above recommendations are the same for women who have or have not gotten the vaccine for human papillomavirus (HPV).   If you had a hysterectomy for a problem that was not cancer or a condition that could lead to cancer, then you no longer need Pap tests. Even if you no longer need a Pap test, a regular exam is a good idea to make sure no other problems are  starting.   If you are between ages 65 and 70, and you have had normal Pap tests going back 10 years, you no longer need Pap tests. Even if you no longer need a Pap test, a regular exam is a good idea to make sure no other problems are starting.   If you have had past treatment for cervical cancer or a condition that could lead to cancer, you need Pap tests and screening for cancer for at least 20 years after your treatment.   If Pap tests have been discontinued, risk factors (such as a new sexual partner) need to be reassessed to determine if screening should be resumed.   The HPV test is an additional test that may be used for cervical cancer screening. The HPV test looks for the virus that can cause the cell changes on the cervix.   The cells collected during the Pap test can be tested for HPV. The HPV test could be used to screen women aged 30 years and older, and should be used in women of any age who have unclear Pap test results. After the age of 30, women should have HPV testing at the same frequency as a Pap test.   Colorectal cancer can be detected and often prevented. Most routine colorectal cancer screening begins at the age of 50 and continues through age 75. However, your caregiver may recommend screening at an earlier age if you have risk factors for colon cancer. On a yearly basis, your caregiver may provide home test kits to check for hidden blood in the stool. Use of a small camera at the end of a tube, to directly examine the colon (sigmoidoscopy or colonoscopy), can detect the earliest forms of colorectal cancer. Talk to your caregiver about this at age 50, when routine screening begins. Direct examination of the colon should be repeated every 5 to 10 years through age 75, unless early forms of pre-cancerous polyps or small growths are found.   Hepatitis C blood testing is recommended for all people born from 1945 through 1965 and any individual with known risks for hepatitis C.    Practice safe sex. Use condoms and avoid high-risk sexual practices to reduce the spread of sexually transmitted infections (STIs). STIs include gonorrhea, chlamydia, syphilis, trichomonas, herpes, HPV, and human immunodeficiency virus (HIV). Herpes, HIV, and HPV are viral illnesses that have no cure. They can result in disability, cancer, and death. Sexually active women aged 25 and younger should be checked for chlamydia. Older women with new or multiple partners should also be tested for chlamydia. Testing for other STIs is recommended if you are sexually active and at increased risk.   Osteoporosis is a disease in which the bones lose minerals and strength with aging. This can result in serious bone fractures. The risk of osteoporosis can be identified using a bone density scan. Women ages 65 and over and women at risk for fractures or osteoporosis should discuss screening with their caregivers. Ask your caregiver whether you should take a calcium supplement or vitamin D to reduce the rate of osteoporosis.   Menopause can be associated with physical symptoms and risks. Hormone replacement therapy is available to decrease symptoms and risks. You should talk to your caregiver about whether hormone replacement therapy is right for you.   Use sunscreen with sun protection factor (SPF) of 30 or more. Apply sunscreen liberally and repeatedly throughout the day. You should seek shade when your shadow is shorter than you. Protect yourself by wearing long sleeves, pants, a wide-brimmed hat, and sunglasses year round, whenever you are outdoors.   Once a month, do a whole body skin exam, using a mirror to look at the skin on your back. Notify your caregiver of new moles, moles that have irregular borders, moles that are larger than a pencil eraser, or moles that have changed in shape or color.   Stay current with required immunizations.   Influenza. You need a dose every fall (or winter). The composition of  the flu vaccine changes each year, so being vaccinated once is not enough.   Pneumococcal polysaccharide. You need 1 to 2 doses if you smoke cigarettes or if you have certain chronic medical conditions. You need 1 dose at age 65 (or older) if you have never been vaccinated.   Tetanus, diphtheria, pertussis (Tdap, Td). Get 1 dose of   Tdap vaccine if you are younger than age 65, are over 65 and have contact with an infant, are a healthcare worker, are pregnant, or simply want to be protected from whooping cough. After that, you need a Td booster dose every 10 years. Consult your caregiver if you have not had at least 3 tetanus and diphtheria-containing shots sometime in your life or have a deep or dirty wound.   HPV. You need this vaccine if you are a woman age 26 or younger. The vaccine is given in 3 doses over 6 months.   Measles, mumps, rubella (MMR). You need at least 1 dose of MMR if you were born in 1957 or later. You may also need a second dose.   Meningococcal. If you are age 19 to 21 and a first-year college student living in a residence hall, or have one of several medical conditions, you need to get vaccinated against meningococcal disease. You may also need additional booster doses.   Zoster (shingles). If you are age 60 or older, you should get this vaccine.   Varicella (chickenpox). If you have never had chickenpox or you were vaccinated but received only 1 dose, talk to your caregiver to find out if you need this vaccine.   Hepatitis A. You need this vaccine if you have a specific risk factor for hepatitis A virus infection or you simply wish to be protected from this disease. The vaccine is usually given as 2 doses, 6 to 18 months apart.   Hepatitis B. You need this vaccine if you have a specific risk factor for hepatitis B virus infection or you simply wish to be protected from this disease. The vaccine is given in 3 doses, usually over 6 months.  Preventive Services /  Frequency Ages 19 to 39  Blood pressure check.** / Every 1 to 2 years.   Lipid and cholesterol check.** / Every 5 years beginning at age 20.   Clinical breast exam.** / Every 3 years for women in their 20s and 30s.   Pap test.** / Every 2 years from ages 21 through 29. Every 3 years starting at age 30 through age 65 or 70 with a history of 3 consecutive normal Pap tests.   HPV screening.** / Every 3 years from ages 30 through ages 65 to 70 with a history of 3 consecutive normal Pap tests.   Hepatitis C blood test.** / For any individual with known risks for hepatitis C.   Skin self-exam. / Monthly.   Influenza immunization.** / Every year.   Pneumococcal polysaccharide immunization.** / 1 to 2 doses if you smoke cigarettes or if you have certain chronic medical conditions.   Tetanus, diphtheria, pertussis (Tdap, Td) immunization. / A one-time dose of Tdap vaccine. After that, you need a Td booster dose every 10 years.   HPV immunization. / 3 doses over 6 months, if you are 26 and younger.   Measles, mumps, rubella (MMR) immunization. / You need at least 1 dose of MMR if you were born in 1957 or later. You may also need a second dose.   Meningococcal immunization. / 1 dose if you are age 19 to 21 and a first-year college student living in a residence hall, or have one of several medical conditions, you need to get vaccinated against meningococcal disease. You may also need additional booster doses.   Varicella immunization.** / Consult your caregiver.   Hepatitis A immunization.** / Consult your caregiver. 2 doses, 6 to 18 months   apart.   Hepatitis B immunization.** / Consult your caregiver. 3 doses usually over 6 months.  Ages 40 to 64  Blood pressure check.** / Every 1 to 2 years.   Lipid and cholesterol check.** / Every 5 years beginning at age 20.   Clinical breast exam.** / Every year after age 40.   Mammogram.** / Every year beginning at age 40 and continuing for as  long as you are in good health. Consult with your caregiver.   Pap test.** / Every 3 years starting at age 30 through age 65 or 70 with a history of 3 consecutive normal Pap tests.   HPV screening.** / Every 3 years from ages 30 through ages 65 to 70 with a history of 3 consecutive normal Pap tests.   Fecal occult blood test (FOBT) of stool. / Every year beginning at age 50 and continuing until age 75. You may not need to do this test if you get a colonoscopy every 10 years.   Flexible sigmoidoscopy or colonoscopy.** / Every 5 years for a flexible sigmoidoscopy or every 10 years for a colonoscopy beginning at age 50 and continuing until age 75.   Hepatitis C blood test.** / For all people born from 1945 through 1965 and any individual with known risks for hepatitis C.   Skin self-exam. / Monthly.   Influenza immunization.** / Every year.   Pneumococcal polysaccharide immunization.** / 1 to 2 doses if you smoke cigarettes or if you have certain chronic medical conditions.   Tetanus, diphtheria, pertussis (Tdap, Td) immunization.** / A one-time dose of Tdap vaccine. After that, you need a Td booster dose every 10 years.   Measles, mumps, rubella (MMR) immunization. / You need at least 1 dose of MMR if you were born in 1957 or later. You may also need a second dose.   Varicella immunization.** / Consult your caregiver.   Meningococcal immunization.** / Consult your caregiver.   Hepatitis A immunization.** / Consult your caregiver. 2 doses, 6 to 18 months apart.   Hepatitis B immunization.** / Consult your caregiver. 3 doses, usually over 6 months.  Ages 65 and over  Blood pressure check.** / Every 1 to 2 years.   Lipid and cholesterol check.** / Every 5 years beginning at age 20.   Clinical breast exam.** / Every year after age 40.   Mammogram.** / Every year beginning at age 40 and continuing for as long as you are in good health. Consult with your caregiver.   Pap test.** /  Every 3 years starting at age 30 through age 65 or 70 with a 3 consecutive normal Pap tests. Testing can be stopped between 65 and 70 with 3 consecutive normal Pap tests and no abnormal Pap or HPV tests in the past 10 years.   HPV screening.** / Every 3 years from ages 30 through ages 65 or 70 with a history of 3 consecutive normal Pap tests. Testing can be stopped between 65 and 70 with 3 consecutive normal Pap tests and no abnormal Pap or HPV tests in the past 10 years.   Fecal occult blood test (FOBT) of stool. / Every year beginning at age 50 and continuing until age 75. You may not need to do this test if you get a colonoscopy every 10 years.   Flexible sigmoidoscopy or colonoscopy.** / Every 5 years for a flexible sigmoidoscopy or every 10 years for a colonoscopy beginning at age 50 and continuing until age 75.   Hepatitis   C blood test.** / For all people born from 1945 through 1965 and any individual with known risks for hepatitis C.   Osteoporosis screening.** / A one-time screening for women ages 65 and over and women at risk for fractures or osteoporosis.   Skin self-exam. / Monthly.   Influenza immunization.** / Every year.   Pneumococcal polysaccharide immunization.** / 1 dose at age 65 (or older) if you have never been vaccinated.   Tetanus, diphtheria, pertussis (Tdap, Td) immunization. / A one-time dose of Tdap vaccine if you are over 65 and have contact with an infant, are a healthcare worker, or simply want to be protected from whooping cough. After that, you need a Td booster dose every 10 years.   Varicella immunization.** / Consult your caregiver.   Meningococcal immunization.** / Consult your caregiver.   Hepatitis A immunization.** / Consult your caregiver. 2 doses, 6 to 18 months apart.   Hepatitis B immunization.** / Check with your caregiver. 3 doses, usually over 6 months.  ** Family history and personal history of risk and conditions may change your caregiver's  recommendations. Document Released: 12/21/2001 Document Revised: 10/14/2011 Document Reviewed: 03/22/2011 [employer] Patient Information 2012 [employer]. 

## 2012-06-06 NOTE — Assessment & Plan Note (Signed)
Mild, avoid trans fats, continue Krill oil

## 2012-06-06 NOTE — Telephone Encounter (Signed)
Please contact patient at work # with PAP results when avail. Do not leave a detailed mess, only a mess to CB.

## 2012-06-06 NOTE — Assessment & Plan Note (Signed)
Patient reports previous diagnosis, has low vitamin d is encouraged to start Citracal bid and she will have her vitamin d rechecked with her exit check up from work

## 2012-06-06 NOTE — Assessment & Plan Note (Signed)
Stable on current dose of levothyroxine.

## 2012-06-06 NOTE — Assessment & Plan Note (Addendum)
Pap today, no concerns identified

## 2012-06-06 NOTE — Assessment & Plan Note (Signed)
Well controlled on repeat check, continue current meds

## 2012-06-06 NOTE — Progress Notes (Signed)
Patient ID: Tammy Grimes, female   DOB: 1946/06/12, 66 y.o.   MRN: [id number] Tammy Grimes [place]areno [id number] 1946-06-20 06/06/2012      Progress Note-Follow Up  Subjective  Chief Complaint  Chief Complaint  Patient presents with  . Gynecologic Exam    pap  . Injections    shingles    HPI  Patient is a 66 year old Caucasian female who is in today for GYN exam. She is just given her notice today work and is retiring after 40 years of working. She is excited and nervous. Physically she reports she feels well. She denies any recent illness, fevers, chills, chest pain, palpitations, shortness of breath, GI or GU complaints.  Past Medical History  Diagnosis Date  . GERD (gastroesophageal reflux disease)   . Hypertension 65  . Cancer 2007    calf of right leg- melanoma  . Anemia     prior to hysterectomy  . Allergy   . Hyperlipidemia   . Thyroid disease 64  . Preventative health care 03/30/2012  . Knee pain 03/30/2012    L>R  . Cervical cancer screening 06/06/2012  . Vitamin d deficiency 06/06/2012    Past Surgical History  Procedure Date  . Breast surgery early 70's    fibroid tumors removed- benign  . Abdominal hysterectomy 1990's    total, for heavy bleeding and fibroids    Family History  Problem Relation Age of Onset  . Hypertension Mother   . Alzheimer's disease Mother   . Dementia Mother     alzheimer's  . Aortic aneurysm Father   . Hypertension Father   . COPD Father     smoker  . Heart disease Father     s/p bypass, aortic aneurysm, carotid artery disease  . Cancer Sister 44    breast- remission  . Diabetes Son 22    type 1  . Alzheimer's disease Maternal Grandmother   . Cancer Maternal Grandfather     prostate  . Stroke Paternal Grandfather     History   Social History  . Marital Status: Married    Spouse Name: N/A    Number of Children: N/A  . Years of Education: N/A   Occupational History  . Not on file.   Social History Main Topics  .  Smoking status: Never Smoker   . Smokeless tobacco: Never Used  . Alcohol Use: No  . Drug Use: No  . Sexually Active: Not Currently   Other Topics Concern  . Not on file   Social History Narrative  . No narrative on file    Current Outpatient Prescriptions on File Prior to Visit  Medication Sig Dispense Refill  . amLODipine (NORVASC) 5 MG tablet Take 5 mg by mouth daily.      Marland Kitchen aspirin 81 MG tablet Take 81 mg by mouth daily.      Marland Kitchen esomeprazole (NEXIUM) 40 MG capsule Take 40 mg by mouth daily before breakfast.      . levothyroxine (SYNTHROID, LEVOTHROID) 75 MCG tablet Take 75 mcg by mouth daily.       No current facility-administered medications on file prior to visit.    Allergies  Allergen Reactions  . Penicillins Rash    Review of Systems  Review of Systems  Constitutional: Negative for fever and malaise/fatigue.  HENT: Negative for congestion.   Eyes: Negative for discharge.  Respiratory: Negative for shortness of breath.   Cardiovascular: Negative for chest pain, palpitations and leg swelling.  Gastrointestinal: Negative  for nausea, abdominal pain and diarrhea.  Genitourinary: Negative for dysuria.  Musculoskeletal: Negative for falls.  Skin: Negative for rash.  Neurological: Negative for loss of consciousness and headaches.  Endo/Heme/Allergies: Negative for polydipsia.  Psychiatric/Behavioral: Negative for depression and suicidal ideas. The patient is not nervous/anxious and does not have insomnia.     Objective  BP 138/82  Pulse 84  Temp 97 F (36.1 C) (Temporal)  Ht 5' 0.75" (1.543 m)  Wt 143 lb 1.9 oz (64.919 kg)  BMI 27.27 kg/m2  SpO2 96%  Physical Exam  Physical Exam  Constitutional: She is oriented to person, place, and time and well-developed, well-nourished, and in no distress. No distress.  HENT:  Head: Normocephalic and atraumatic.  Right Ear: External ear normal.  Left Ear: External ear normal.  Nose: Nose normal.  Mouth/Throat:  Oropharynx is clear and moist. No oropharyngeal exudate.  Eyes: Conjunctivae are normal. Pupils are equal, round, and reactive to light. Right eye exhibits no discharge. Left eye exhibits no discharge. No scleral icterus.  Neck: Normal range of motion. Neck supple. No thyromegaly present.  Cardiovascular: Normal rate, regular rhythm, normal heart sounds and intact distal pulses.   No murmur heard. Pulmonary/Chest: Effort normal and breath sounds normal. No respiratory distress. She has no wheezes. She has no rales.  Abdominal: Soft. Bowel sounds are normal. She exhibits no distension and no mass. There is no tenderness.  Genitourinary: Vagina normal, right adnexa normal and left adnexa normal. No vaginal discharge found.       Cervix surgically absent. Breast exam unremarkable. No lesions, masses, discharge  Musculoskeletal: Normal range of motion. She exhibits no edema and no tenderness.  Lymphadenopathy:    She has no cervical adenopathy.  Neurological: She is alert and oriented to person, place, and time. She has normal reflexes. No cranial nerve deficit. Coordination normal.  Skin: Skin is warm and dry. No rash noted. She is not diaphoretic.  Psychiatric: Mood, memory and affect normal.     Assessment & Plan  Cervical cancer screening Pap today, no concerns identified  Hypertension Well controlled on repeat check, continue current meds  Hyperlipidemia Mild, avoid trans fats, continue Krill oil  Osteopenia Patient reports previous diagnosis, has low vitamin d is encouraged to start Citracal bid and she will have her vitamin d rechecked with her exit check up from work  Thyroid disease Stable on current dose of levothyroxine   2

## 2012-06-12 NOTE — Progress Notes (Signed)
Quick Note:  Patient Informed and voiced understanding ______ 

## 2012-06-13 NOTE — Telephone Encounter (Signed)
Patient was given results 06/12/12

## 2012-08-31 ENCOUNTER — Ambulatory Visit (INDEPENDENT_AMBULATORY_CARE_PROVIDER_SITE_OTHER): Payer: BC Managed Care – PPO

## 2012-08-31 DIAGNOSIS — Z23 Encounter for immunization: Secondary | ICD-10-CM

## 2012-11-27 ENCOUNTER — Other Ambulatory Visit: Payer: Self-pay | Admitting: Family Medicine

## 2012-11-27 MED ORDER — LEVOTHYROXINE SODIUM 75 MCG PO TABS: 75.0000 ug | ORAL_TABLET | Freq: Every day | ORAL | Status: DC

## 2012-11-27 NOTE — Telephone Encounter (Signed)
10 tabs of synthroid sent to local pharmacy. I will print other RX's Wed while at [facility]

## 2012-11-27 NOTE — Telephone Encounter (Signed)
Patient also needs to pu a 10 day Rx for Synthroid that she is going to fill locally. She doesn't have enough to wait for mail order company to send her some. Her Rx insurance has changed so she needs to switch pharmacy.

## 2012-11-29 MED ORDER — AMLODIPINE BESYLATE 5 MG PO TABS: 5.0000 mg | ORAL_TABLET | Freq: Every day | ORAL | Status: DC

## 2012-11-29 MED ORDER — ESOMEPRAZOLE MAGNESIUM 40 MG PO CPDR: 40.0000 mg | DELAYED_RELEASE_CAPSULE | Freq: Every day | ORAL | Status: DC

## 2012-11-29 MED ORDER — LEVOTHYROXINE SODIUM 75 MCG PO TABS: 75.0000 ug | ORAL_TABLET | Freq: Every day | ORAL | Status: DC

## 2012-11-29 NOTE — Telephone Encounter (Signed)
RX's printed and put up front

## 2012-11-30 DIAGNOSIS — H26019 Infantile and juvenile cortical, lamellar, or zonular cataract, unspecified eye: Secondary | ICD-10-CM | POA: Diagnosis not present

## 2012-11-30 DIAGNOSIS — H2510 Age-related nuclear cataract, unspecified eye: Secondary | ICD-10-CM | POA: Diagnosis not present

## 2012-12-09 HISTORY — PX: CATARACT EXTRACTION: SUR2

## 2012-12-20 DIAGNOSIS — H2510 Age-related nuclear cataract, unspecified eye: Secondary | ICD-10-CM | POA: Diagnosis not present

## 2012-12-27 DIAGNOSIS — Z961 Presence of intraocular lens: Secondary | ICD-10-CM | POA: Diagnosis not present

## 2012-12-27 DIAGNOSIS — H2510 Age-related nuclear cataract, unspecified eye: Secondary | ICD-10-CM | POA: Diagnosis not present

## 2012-12-27 DIAGNOSIS — H26019 Infantile and juvenile cortical, lamellar, or zonular cataract, unspecified eye: Secondary | ICD-10-CM | POA: Diagnosis not present

## 2013-01-03 DIAGNOSIS — H26019 Infantile and juvenile cortical, lamellar, or zonular cataract, unspecified eye: Secondary | ICD-10-CM | POA: Diagnosis not present

## 2013-01-03 DIAGNOSIS — H2510 Age-related nuclear cataract, unspecified eye: Secondary | ICD-10-CM | POA: Diagnosis not present

## 2013-02-19 DIAGNOSIS — I8310 Varicose veins of unspecified lower extremity with inflammation: Secondary | ICD-10-CM | POA: Diagnosis not present

## 2013-02-19 DIAGNOSIS — D1801 Hemangioma of skin and subcutaneous tissue: Secondary | ICD-10-CM | POA: Diagnosis not present

## 2013-02-19 DIAGNOSIS — L821 Other seborrheic keratosis: Secondary | ICD-10-CM | POA: Diagnosis not present

## 2013-02-19 DIAGNOSIS — D235 Other benign neoplasm of skin of trunk: Secondary | ICD-10-CM | POA: Diagnosis not present

## 2013-02-19 DIAGNOSIS — Z85820 Personal history of malignant melanoma of skin: Secondary | ICD-10-CM | POA: Diagnosis not present

## 2013-03-12 DIAGNOSIS — H00029 Hordeolum internum unspecified eye, unspecified eyelid: Secondary | ICD-10-CM | POA: Diagnosis not present

## 2013-03-14 DIAGNOSIS — Z1231 Encounter for screening mammogram for malignant neoplasm of breast: Secondary | ICD-10-CM | POA: Diagnosis not present

## 2013-03-19 DIAGNOSIS — H00029 Hordeolum internum unspecified eye, unspecified eyelid: Secondary | ICD-10-CM | POA: Diagnosis not present

## 2013-05-28 DIAGNOSIS — Z961 Presence of intraocular lens: Secondary | ICD-10-CM | POA: Diagnosis not present

## 2013-06-11 ENCOUNTER — Telehealth: Payer: Self-pay

## 2013-06-11 NOTE — Telephone Encounter (Signed)
OK to send in a 30 day supply of current strenth of Synthroid

## 2013-06-11 NOTE — Telephone Encounter (Signed)
Patient left a message that she has an appt on 07-24-13 but will run out of her Synthroid by then? Pt would like enough sent in until her September appt to [facility] in [place]?  Pt hasn't been seen since 06-06-12, please advise if refill can be done or if pt needs to schedule an appt sooner?

## 2013-06-12 MED ORDER — LEVOTHYROXINE SODIUM 75 MCG PO TABS: 75.0000 ug | ORAL_TABLET | Freq: Every day | ORAL | Status: DC

## 2013-07-13 ENCOUNTER — Other Ambulatory Visit: Payer: Self-pay | Admitting: Family Medicine

## 2013-07-24 ENCOUNTER — Ambulatory Visit (INDEPENDENT_AMBULATORY_CARE_PROVIDER_SITE_OTHER): Payer: Medicare Other | Admitting: Family Medicine

## 2013-07-24 ENCOUNTER — Encounter: Payer: Self-pay | Admitting: Family Medicine

## 2013-07-24 VITALS — BP 130/88 | HR 82 | Temp 98.4°F | Ht 60.75 in | Wt 131.0 lb

## 2013-07-24 DIAGNOSIS — Z23 Encounter for immunization: Secondary | ICD-10-CM

## 2013-07-24 DIAGNOSIS — M899 Disorder of bone, unspecified: Secondary | ICD-10-CM | POA: Diagnosis not present

## 2013-07-24 DIAGNOSIS — D649 Anemia, unspecified: Secondary | ICD-10-CM | POA: Diagnosis not present

## 2013-07-24 DIAGNOSIS — M25512 Pain in left shoulder: Secondary | ICD-10-CM

## 2013-07-24 DIAGNOSIS — Z8249 Family history of ischemic heart disease and other diseases of the circulatory system: Secondary | ICD-10-CM | POA: Diagnosis not present

## 2013-07-24 DIAGNOSIS — Z0000 Encounter for general adult medical examination without abnormal findings: Secondary | ICD-10-CM | POA: Diagnosis not present

## 2013-07-24 DIAGNOSIS — E785 Hyperlipidemia, unspecified: Secondary | ICD-10-CM | POA: Diagnosis not present

## 2013-07-24 DIAGNOSIS — G8929 Other chronic pain: Secondary | ICD-10-CM

## 2013-07-24 DIAGNOSIS — E079 Disorder of thyroid, unspecified: Secondary | ICD-10-CM

## 2013-07-24 DIAGNOSIS — M25519 Pain in unspecified shoulder: Secondary | ICD-10-CM

## 2013-07-24 DIAGNOSIS — I10 Essential (primary) hypertension: Secondary | ICD-10-CM | POA: Diagnosis not present

## 2013-07-24 DIAGNOSIS — M25559 Pain in unspecified hip: Secondary | ICD-10-CM

## 2013-07-24 DIAGNOSIS — M25569 Pain in unspecified knee: Secondary | ICD-10-CM

## 2013-07-24 DIAGNOSIS — M8580 Other specified disorders of bone density and structure, unspecified site: Secondary | ICD-10-CM

## 2013-07-24 DIAGNOSIS — E559 Vitamin D deficiency, unspecified: Secondary | ICD-10-CM

## 2013-07-24 LAB — LIPID PANEL
LDL Cholesterol: 172 mg/dL — ABNORMAL HIGH (ref 0–99)
Total CHOL/HDL Ratio: 5.3 Ratio
VLDL: 28 mg/dL (ref 0–40)

## 2013-07-24 LAB — HEPATIC FUNCTION PANEL
ALT: 25 U/L (ref 0–35)
Alkaline Phosphatase: 83 U/L (ref 39–117)
Indirect Bilirubin: 0.4 mg/dL (ref 0.0–0.9)
Total Protein: 7.2 g/dL (ref 6.0–8.3)

## 2013-07-24 LAB — RENAL FUNCTION PANEL
BUN: 14 mg/dL (ref 6–23)
Calcium: 9.7 mg/dL (ref 8.4–10.5)
Chloride: 107 mEq/L (ref 96–112)
Glucose, Bld: 99 mg/dL (ref 70–99)
Potassium: 4.5 mEq/L (ref 3.5–5.3)

## 2013-07-24 LAB — CBC
HCT: 39.2 % (ref 36.0–46.0)
Hemoglobin: 13.4 g/dL (ref 12.0–15.0)
MCHC: 34.2 g/dL (ref 30.0–36.0)

## 2013-07-24 MED ORDER — LEVOTHYROXINE SODIUM 75 MCG PO TABS: 75.0000 ug | ORAL_TABLET | Freq: Every day | ORAL | Status: DC

## 2013-07-24 MED ORDER — AMLODIPINE BESYLATE 5 MG PO TABS: 5.0000 mg | ORAL_TABLET | Freq: Every day | ORAL | Status: DC

## 2013-07-24 MED ORDER — ESOMEPRAZOLE MAGNESIUM 40 MG PO CPDR: 40.0000 mg | DELAYED_RELEASE_CAPSULE | Freq: Every day | ORAL | Status: DC

## 2013-07-24 NOTE — Patient Instructions (Addendum)
Try Salon Pas patches or cream   Preventive Care for Adults, Female A healthy lifestyle and preventive care can promote health and wellness. Preventive health guidelines for women include the following key practices.  A routine yearly physical is a good way to check with your caregiver about your health and preventive screening. It is a chance to share any concerns and updates on your health, and to receive a thorough exam.  Visit your dentist for a routine exam and preventive care every 6 months. Brush your teeth twice a day and floss once a day. Good oral hygiene prevents tooth decay and gum disease.  The frequency of eye exams is based on your age, health, family medical history, use of contact lenses, and other factors. Follow your caregiver's recommendations for frequency of eye exams.  Eat a healthy diet. Foods like vegetables, fruits, whole grains, low-fat dairy products, and lean protein foods contain the nutrients you need without too many calories. Decrease your intake of foods high in solid fats, added sugars, and salt. Eat the right amount of calories for you.Get information about a proper diet from your caregiver, if necessary.  Regular physical exercise is one of the most important things you can do for your health. Most adults should get at least 150 minutes of moderate-intensity exercise (any activity that increases your heart rate and causes you to sweat) each week. In addition, most adults need muscle-strengthening exercises on 2 or more days a week.  Maintain a healthy weight. The body mass index (BMI) is a screening tool to identify possible weight problems. It provides an estimate of body fat based on height and weight. Your caregiver can help determine your BMI, and can help you achieve or maintain a healthy weight.For adults 20 years and older:  A BMI below 18.5 is considered underweight.  A BMI of 18.5 to 24.9 is normal.  A BMI of 25 to 29.9 is considered  overweight.  A BMI of 30 and above is considered obese.  Maintain normal blood lipids and cholesterol levels by exercising and minimizing your intake of saturated fat. Eat a balanced diet with plenty of fruit and vegetables. Blood tests for lipids and cholesterol should begin at age 68 and be repeated every 5 years. If your lipid or cholesterol levels are high, you are over 50, or you are at high risk for heart disease, you may need your cholesterol levels checked more frequently.Ongoing high lipid and cholesterol levels should be treated with medicines if diet and exercise are not effective.  If you smoke, find out from your caregiver how to quit. If you do not use tobacco, do not start.  If you are pregnant, do not drink alcohol. If you are breastfeeding, be very cautious about drinking alcohol. If you are not pregnant and choose to drink alcohol, do not exceed 1 drink per day. One drink is considered to be 12 ounces (355 mL) of beer, 5 ounces (148 mL) of wine, or 1.5 ounces (44 mL) of liquor.  Avoid use of street drugs. Do not share needles with anyone. Ask for help if you need support or instructions about stopping the use of drugs.  High blood pressure causes heart disease and increases the risk of stroke. Your blood pressure should be checked at least every 1 to 2 years. Ongoing high blood pressure should be treated with medicines if weight loss and exercise are not effective.  If you are 20 to 65 years old, ask your caregiver if you  should take aspirin to prevent strokes.  Diabetes screening involves taking a blood sample to check your fasting blood sugar level. This should be done once every 3 years, after age 60, if you are within normal weight and without risk factors for diabetes. Testing should be considered at a younger age or be carried out more frequently if you are overweight and have at least 1 risk factor for diabetes.  Breast cancer screening is essential preventive care for  women. You should practice "breast self-awareness." This means understanding the normal appearance and feel of your breasts and may include breast self-examination. Any changes detected, no matter how small, should be reported to a caregiver. Women in their 31s and 30s should have a clinical breast exam (CBE) by a caregiver as part of a regular health exam every 1 to 3 years. After age 43, women should have a CBE every year. Starting at age 37, women should consider having a mammography (breast X-ray test) every year. Women who have a family history of breast cancer should talk to their caregiver about genetic screening. Women at a high risk of breast cancer should talk to their caregivers about having magnetic resonance imaging (MRI) and a mammography every year.  The Pap test is a screening test for cervical cancer. A Pap test can show cell changes on the cervix that might become cervical cancer if left untreated. A Pap test is a procedure in which cells are obtained and examined from the lower end of the uterus (cervix).  Women should have a Pap test starting at age 57.  Between ages 38 and 15, Pap tests should be repeated every 2 years.  Beginning at age 34, you should have a Pap test every 3 years as long as the past 3 Pap tests have been normal.  Some women have medical problems that increase the chance of getting cervical cancer. Talk to your caregiver about these problems. It is especially important to talk to your caregiver if a new problem develops soon after your last Pap test. In these cases, your caregiver may recommend more frequent screening and Pap tests.  The above recommendations are the same for women who have or have not gotten the vaccine for human papillomavirus (HPV).  If you had a hysterectomy for a problem that was not cancer or a condition that could lead to cancer, then you no longer need Pap tests. Even if you no longer need a Pap test, a regular exam is a good idea to make  sure no other problems are starting.  If you are between ages 65 and 66, and you have had normal Pap tests going back 10 years, you no longer need Pap tests. Even if you no longer need a Pap test, a regular exam is a good idea to make sure no other problems are starting.  If you have had past treatment for cervical cancer or a condition that could lead to cancer, you need Pap tests and screening for cancer for at least 20 years after your treatment.  If Pap tests have been discontinued, risk factors (such as a new sexual partner) need to be reassessed to determine if screening should be resumed.  The HPV test is an additional test that may be used for cervical cancer screening. The HPV test looks for the virus that can cause the cell changes on the cervix. The cells collected during the Pap test can be tested for HPV. The HPV test could be used to screen  women aged 52 years and older, and should be used in women of any age who have unclear Pap test results. After the age of 55, women should have HPV testing at the same frequency as a Pap test.  Colorectal cancer can be detected and often prevented. Most routine colorectal cancer screening begins at the age of 64 and continues through age 82. However, your caregiver may recommend screening at an earlier age if you have risk factors for colon cancer. On a yearly basis, your caregiver may provide home test kits to check for hidden blood in the stool. Use of a small camera at the end of a tube, to directly examine the colon (sigmoidoscopy or colonoscopy), can detect the earliest forms of colorectal cancer. Talk to your caregiver about this at age 59, when routine screening begins. Direct examination of the colon should be repeated every 5 to 10 years through age 88, unless early forms of pre-cancerous polyps or small growths are found.  Hepatitis C blood testing is recommended for all people born from [birth year] through 1965 and any individual with known risks  for hepatitis C.  Practice safe sex. Use condoms and avoid high-risk sexual practices to reduce the spread of sexually transmitted infections (STIs). STIs include gonorrhea, chlamydia, syphilis, trichomonas, herpes, HPV, and human immunodeficiency virus (HIV). Herpes, HIV, and HPV are viral illnesses that have no cure. They can result in disability, cancer, and death. Sexually active women aged 22 and younger should be checked for chlamydia. Older women with new or multiple partners should also be tested for chlamydia. Testing for other STIs is recommended if you are sexually active and at increased risk.  Osteoporosis is a disease in which the bones lose minerals and strength with aging. This can result in serious bone fractures. The risk of osteoporosis can be identified using a bone density scan. Women ages 50 and over and women at risk for fractures or osteoporosis should discuss screening with their caregivers. Ask your caregiver whether you should take a calcium supplement or vitamin D to reduce the rate of osteoporosis.  Menopause can be associated with physical symptoms and risks. Hormone replacement therapy is available to decrease symptoms and risks. You should talk to your caregiver about whether hormone replacement therapy is right for you.  Use sunscreen with sun protection factor (SPF) of 30 or more. Apply sunscreen liberally and repeatedly throughout the day. You should seek shade when your shadow is shorter than you. Protect yourself by wearing long sleeves, pants, a wide-brimmed hat, and sunglasses year round, whenever you are outdoors.  Once a month, do a whole body skin exam, using a mirror to look at the skin on your back. Notify your caregiver of new moles, moles that have irregular borders, moles that are larger than a pencil eraser, or moles that have changed in shape or color.  Stay current with required immunizations.  Influenza. You need a dose every fall (or winter). The  composition of the flu vaccine changes each year, so being vaccinated once is not enough.  Pneumococcal polysaccharide. You need 1 to 2 doses if you smoke cigarettes or if you have certain chronic medical conditions. You need 1 dose at age 66 (or older) if you have never been vaccinated.  Tetanus, diphtheria, pertussis (Tdap, Td). Get 1 dose of Tdap vaccine if you are younger than age 43, are over 5 and have contact with an infant, are a Research scientist (physical sciences), are pregnant, or simply want to be protected from  whooping cough. After that, you need a Td booster dose every 10 years. Consult your caregiver if you have not had at least 3 tetanus and diphtheria-containing shots sometime in your life or have a deep or dirty wound.  HPV. You need this vaccine if you are a woman age 53 or younger. The vaccine is given in 3 doses over 6 months.  Measles, mumps, rubella (MMR). You need at least 1 dose of MMR if you were born in 1957 or later. You may also need a second dose.  Meningococcal. If you are age 18 to 80 and a first-year college student living in a residence hall, or have one of several medical conditions, you need to get vaccinated against meningococcal disease. You may also need additional booster doses.  Zoster (shingles). If you are age 94 or older, you should get this vaccine.  Varicella (chickenpox). If you have never had chickenpox or you were vaccinated but received only 1 dose, talk to your caregiver to find out if you need this vaccine.  Hepatitis A. You need this vaccine if you have a specific risk factor for hepatitis A virus infection or you simply wish to be protected from this disease. The vaccine is usually given as 2 doses, 6 to 18 months apart.  Hepatitis B. You need this vaccine if you have a specific risk factor for hepatitis B virus infection or you simply wish to be protected from this disease. The vaccine is given in 3 doses, usually over 6 months. Preventive Services /  Frequency Ages 42 to 41  Blood pressure check.** / Every 1 to 2 years.  Lipid and cholesterol check.** / Every 5 years beginning at age 64.  Clinical breast exam.** / Every 3 years for women in their 61s and 30s.  Pap test.** / Every 2 years from ages 77 through 6. Every 3 years starting at age 39 through age 31 or 77 with a history of 3 consecutive normal Pap tests.  HPV screening.** / Every 3 years from ages 66 through ages 26 to 61 with a history of 3 consecutive normal Pap tests.  Hepatitis C blood test.** / For any individual with known risks for hepatitis C.  Skin self-exam. / Monthly.  Influenza immunization.** / Every year.  Pneumococcal polysaccharide immunization.** / 1 to 2 doses if you smoke cigarettes or if you have certain chronic medical conditions.  Tetanus, diphtheria, pertussis (Tdap, Td) immunization. / A one-time dose of Tdap vaccine. After that, you need a Td booster dose every 10 years.  HPV immunization. / 3 doses over 6 months, if you are 42 and younger.  Measles, mumps, rubella (MMR) immunization. / You need at least 1 dose of MMR if you were born in 1957 or later. You may also need a second dose.  Meningococcal immunization. / 1 dose if you are age 73 to 57 and a first-year college student living in a residence hall, or have one of several medical conditions, you need to get vaccinated against meningococcal disease. You may also need additional booster doses.  Varicella immunization.** / Consult your caregiver.  Hepatitis A immunization.** / Consult your caregiver. 2 doses, 6 to 18 months apart.  Hepatitis B immunization.** / Consult your caregiver. 3 doses usually over 6 months. Ages 63 to 25  Blood pressure check.** / Every 1 to 2 years.  Lipid and cholesterol check.** / Every 5 years beginning at age 80.  Clinical breast exam.** / Every year after age 62.  Mammogram.** / Every year beginning at age 24 and continuing for as long as you are in  good health. Consult with your caregiver.  Pap test.** / Every 3 years starting at age 44 through age 28 or 66 with a history of 3 consecutive normal Pap tests.  HPV screening.** / Every 3 years from ages 9 through ages 19 to 30 with a history of 3 consecutive normal Pap tests.  Fecal occult blood test (FOBT) of stool. / Every year beginning at age 29 and continuing until age 65. You may not need to do this test if you get a colonoscopy every 10 years.  Flexible sigmoidoscopy or colonoscopy.** / Every 5 years for a flexible sigmoidoscopy or every 10 years for a colonoscopy beginning at age 66 and continuing until age 47.  Hepatitis C blood test.** / For all people born from [birth year] through 1963 and any individual with known risks for hepatitis C.  Skin self-exam. / Monthly.  Influenza immunization.** / Every year.  Pneumococcal polysaccharide immunization.** / 1 to 2 doses if you smoke cigarettes or if you have certain chronic medical conditions.  Tetanus, diphtheria, pertussis (Tdap, Td) immunization.** / A one-time dose of Tdap vaccine. After that, you need a Td booster dose every 10 years.  Measles, mumps, rubella (MMR) immunization. / You need at least 1 dose of MMR if you were born in 1957 or later. You may also need a second dose.  Varicella immunization.** / Consult your caregiver.  Meningococcal immunization.** / Consult your caregiver.  Hepatitis A immunization.** / Consult your caregiver. 2 doses, 6 to 18 months apart.  Hepatitis B immunization.** / Consult your caregiver. 3 doses, usually over 6 months. Ages 57 and over  Blood pressure check.** / Every 1 to 2 years.  Lipid and cholesterol check.** / Every 5 years beginning at age 107.  Clinical breast exam.** / Every year after age 39.  Mammogram.** / Every year beginning at age 105 and continuing for as long as you are in good health. Consult with your caregiver.  Pap test.** / Every 3 years starting at age 57 through  age 21 or 26 with a 3 consecutive normal Pap tests. Testing can be stopped between 65 and 70 with 3 consecutive normal Pap tests and no abnormal Pap or HPV tests in the past 10 years.  HPV screening.** / Every 3 years from ages 15 through ages 71 or 76 with a history of 3 consecutive normal Pap tests. Testing can be stopped between 65 and 70 with 3 consecutive normal Pap tests and no abnormal Pap or HPV tests in the past 10 years.  Fecal occult blood test (FOBT) of stool. / Every year beginning at age 2 and continuing until age 39. You may not need to do this test if you get a colonoscopy every 10 years.  Flexible sigmoidoscopy or colonoscopy.** / Every 5 years for a flexible sigmoidoscopy or every 10 years for a colonoscopy beginning at age 29 and continuing until age 88.  Hepatitis C blood test.** / For all people born from [birth year] through 1963 and any individual with known risks for hepatitis C.  Osteoporosis screening.** / A one-time screening for women ages 19 and over and women at risk for fractures or osteoporosis.  Skin self-exam. / Monthly.  Influenza immunization.** / Every year.  Pneumococcal polysaccharide immunization.** / 1 dose at age 16 (or older) if you have never been vaccinated.  Tetanus, diphtheria, pertussis (Tdap, Td) immunization. / A one-time  dose of Tdap vaccine if you are over 65 and have contact with an infant, are a Research scientist (physical sciences), or simply want to be protected from whooping cough. After that, you need a Td booster dose every 10 years.  Varicella immunization.** / Consult your caregiver.  Meningococcal immunization.** / Consult your caregiver.  Hepatitis A immunization.** / Consult your caregiver. 2 doses, 6 to 18 months apart.  Hepatitis B immunization.** / Check with your caregiver. 3 doses, usually over 6 months. ** Family history and personal history of risk and conditions may change your caregiver's recommendations. Document Released: 12/21/2001 Document  Revised: 01/17/2012 Document Reviewed: 03/22/2011 [facility] Patient Information 2014 [place], [place].

## 2013-07-24 NOTE — Progress Notes (Signed)
Patient ID: Janace Aris Rovner, female   DOB: 1946-11-03, 67 y.o.   MRN: [id number] Adamae Ricklefs Mccamy [id number] 15-Jan-1946 07/24/2013      Progress Note New Patient  Subjective  Chief Complaint  Chief Complaint  Patient presents with  . medicare wellness  . Injections    flu and pneumonia    HPI  Patient is a 67 year old Caucasian female who is in today for wellness exam. Stooling well. Standard head removed in February and he feels much better. She is struggling with chronic pain. Most notably in her left shoulder and left hip. Has trouble in with a foot. Has noted that some orthotics in her shoes out some. Has had some trouble with reflux but it is minimal. His following with Dr. [place] dermatology but no concerning lesions have been identified. No fevers or chills. No chest pain, palpitations, shortness of breath, GI or GU complaints at this time  Past Medical History  Diagnosis Date  . GERD (gastroesophageal reflux disease)   . Hypertension 67  . Cancer 2007    calf of right leg- melanoma  . Anemia     prior to hysterectomy  . Allergy   . Hyperlipidemia   . Thyroid disease 67  . Preventative health care 65/23/2013  . Knee pain 65/23/2013    L>R  . Cervical cancer screening 65/30/2013  . Vitamin D deficiency 65/30/2013    Past Surgical History  Procedure Laterality Date  . Breast surgery  early 70's    fibroid tumors removed- benign  . Abdominal hysterectomy  1990's    total, for heavy bleeding and fibroids    Family History  Problem Relation Age of Onset  . Hypertension Mother   . Alzheimer's disease Mother   . Dementia Mother     alzheimer's  . Aortic aneurysm Father   . Hypertension Father   . COPD Father     smoker  . Heart disease Father     s/p bypass, aortic aneurysm, carotid artery disease  . Cancer Sister 14    breast- remission  . Diabetes Son 22    type 1  . Alzheimer's disease Maternal Grandmother   . Cancer Maternal Grandfather     prostate  .  Stroke Paternal Grandfather     History   Social History  . Marital Status: Married    Spouse Name: N/A    Number of Children: N/A  . Years of Education: N/A   Occupational History  . Not on file.   Social History Main Topics  . Smoking status: Never Smoker   . Smokeless tobacco: Never Used  . Alcohol Use: No  . Drug Use: No  . Sexual Activity: Not Currently   Other Topics Concern  . Not on file   Social History Narrative  . No narrative on file    Current Outpatient Prescriptions on File Prior to Visit  Medication Sig Dispense Refill  . aspirin 81 MG tablet Take 81 mg by mouth daily.      Marland Kitchen KRILL OIL PO Take 1 capsule by mouth daily.       No current facility-administered medications on file prior to visit.    Allergies  Allergen Reactions  . Penicillins Rash    Review of Systems  Review of Systems  Constitutional: Negative for fever, chills and malaise/fatigue.  HENT: Negative for hearing loss, nosebleeds and congestion.   Eyes: Negative for discharge.  Respiratory: Negative for cough, sputum production, shortness of breath and wheezing.  Cardiovascular: Negative for chest pain, palpitations and leg swelling.  Gastrointestinal: Positive for heartburn. Negative for nausea, vomiting, abdominal pain, diarrhea, constipation and blood in stool.  Genitourinary: Negative for dysuria, urgency, frequency and hematuria.  Musculoskeletal: Positive for back pain and joint pain. Negative for myalgias and falls.  Skin: Negative for rash.  Neurological: Negative for dizziness, tremors, sensory change, focal weakness, loss of consciousness, weakness and headaches.  Endo/Heme/Allergies: Negative for polydipsia. Does not bruise/bleed easily.  Psychiatric/Behavioral: Negative for depression and suicidal ideas. The patient is not nervous/anxious and does not have insomnia.     Objective  BP 130/88  Pulse 82  Temp(Src) 98.4 F (36.9 C) (Oral)  Ht 5' 0.75" (1.543 m)  Wt  131 lb (59.421 kg)  BMI 24.96 kg/m2  SpO2 96%  Physical Exam  Physical Exam  Constitutional: She is oriented to person, place, and time and well-developed, well-nourished, and in no distress. No distress.  HENT:  Head: Normocephalic and atraumatic.  Right Ear: External ear normal.  Left Ear: External ear normal.  Nose: Nose normal.  Mouth/Throat: Oropharynx is clear and moist. No oropharyngeal exudate.  Eyes: Conjunctivae are normal. Pupils are equal, round, and reactive to light. Right eye exhibits no discharge. Left eye exhibits no discharge. No scleral icterus.  Neck: Normal range of motion. Neck supple. No thyromegaly present.  Cardiovascular: Normal rate, regular rhythm, normal heart sounds and intact distal pulses.   No murmur heard. Pulmonary/Chest: Effort normal and breath sounds normal. No respiratory distress. She has no wheezes. She has no rales.  Abdominal: Soft. Bowel sounds are normal. She exhibits no distension and no mass. There is no tenderness.  Musculoskeletal: Normal range of motion. She exhibits no edema and no tenderness.  Lymphadenopathy:    She has no cervical adenopathy.  Neurological: She is alert and oriented to person, place, and time. She has normal reflexes. No cranial nerve deficit. Coordination normal.  Skin: Skin is warm and dry. No rash noted. She is not diaphoretic.  Psychiatric: Mood, memory and affect normal.       Assessment & Plan  Hypertension Well controlled, no changes. Refills given on meds today  Preventative health care Doing well, no recent hearing or vision changes. Doing well with ADLs no depression or falls, encouraged regular exercise and DASH diet. Refills given on meds.  Hyperlipidemia Patient hesitant to start statins at this time, avoid trans fats, continue krill oil, consider Red Yeast Rice and CoQ10  Anemia resolved  Vitamin D deficiency wnl today.  Thyroid disease Stable on current dose of Levothyroxine given  refills today.

## 2013-07-28 NOTE — Assessment & Plan Note (Signed)
Patient hesitant to start statins at this time, avoid trans fats, continue krill oil, consider Red Yeast Rice and CoQ10

## 2013-07-28 NOTE — Assessment & Plan Note (Signed)
wnl today 

## 2013-07-28 NOTE — Assessment & Plan Note (Addendum)
Well controlled, no changes. Refills given on meds today

## 2013-07-28 NOTE — Assessment & Plan Note (Signed)
Stable on current dose of Levothyroxine given refills today.

## 2013-07-28 NOTE — Assessment & Plan Note (Signed)
resolved 

## 2013-07-28 NOTE — Assessment & Plan Note (Signed)
Doing well, no recent hearing or vision changes. Doing well with ADLs no depression or falls, encouraged regular exercise and DASH diet. Refills given on meds.

## 2013-08-06 ENCOUNTER — Ambulatory Visit (HOSPITAL_BASED_OUTPATIENT_CLINIC_OR_DEPARTMENT_OTHER)
Admission: RE | Admit: 2013-08-06 | Discharge: 2013-08-06 | Disposition: A | Discharge status: Home / Self Care | Payer: Medicare Other | Source: Ambulatory Visit | Attending: Family Medicine | Admitting: Family Medicine

## 2013-08-06 DIAGNOSIS — Z8249 Family history of ischemic heart disease and other diseases of the circulatory system: Secondary | ICD-10-CM

## 2013-08-06 DIAGNOSIS — Z136 Encounter for screening for cardiovascular disorders: Secondary | ICD-10-CM | POA: Diagnosis not present

## 2013-08-06 DIAGNOSIS — I10 Essential (primary) hypertension: Secondary | ICD-10-CM | POA: Insufficient documentation

## 2013-08-06 DIAGNOSIS — Z0000 Encounter for general adult medical examination without abnormal findings: Secondary | ICD-10-CM

## 2013-08-06 DIAGNOSIS — Z1389 Encounter for screening for other disorder: Secondary | ICD-10-CM | POA: Insufficient documentation

## 2013-08-06 NOTE — Progress Notes (Signed)
Quick Note:  Called pt but call got dropped ______

## 2013-08-06 NOTE — Progress Notes (Signed)
Quick Note:  Patient Informed and voiced understanding ______ 

## 2013-09-25 DIAGNOSIS — M1710 Unilateral primary osteoarthritis, unspecified knee: Secondary | ICD-10-CM | POA: Diagnosis not present

## 2013-09-25 DIAGNOSIS — M5137 Other intervertebral disc degeneration, lumbosacral region: Secondary | ICD-10-CM | POA: Diagnosis not present

## 2013-10-02 DIAGNOSIS — M5137 Other intervertebral disc degeneration, lumbosacral region: Secondary | ICD-10-CM | POA: Diagnosis not present

## 2013-10-08 DIAGNOSIS — M5137 Other intervertebral disc degeneration, lumbosacral region: Secondary | ICD-10-CM | POA: Diagnosis not present

## 2013-10-12 DIAGNOSIS — M5137 Other intervertebral disc degeneration, lumbosacral region: Secondary | ICD-10-CM | POA: Diagnosis not present

## 2013-10-16 DIAGNOSIS — M5137 Other intervertebral disc degeneration, lumbosacral region: Secondary | ICD-10-CM | POA: Diagnosis not present

## 2013-10-23 DIAGNOSIS — M5137 Other intervertebral disc degeneration, lumbosacral region: Secondary | ICD-10-CM | POA: Diagnosis not present

## 2013-10-25 DIAGNOSIS — M5137 Other intervertebral disc degeneration, lumbosacral region: Secondary | ICD-10-CM | POA: Diagnosis not present

## 2013-10-30 DIAGNOSIS — M5137 Other intervertebral disc degeneration, lumbosacral region: Secondary | ICD-10-CM | POA: Diagnosis not present

## 2013-11-06 DIAGNOSIS — M5137 Other intervertebral disc degeneration, lumbosacral region: Secondary | ICD-10-CM | POA: Diagnosis not present

## 2013-11-08 HISTORY — PX: EYE SURGERY: SHX253

## 2013-11-09 DIAGNOSIS — M1710 Unilateral primary osteoarthritis, unspecified knee: Secondary | ICD-10-CM | POA: Diagnosis not present

## 2013-11-15 DIAGNOSIS — M1710 Unilateral primary osteoarthritis, unspecified knee: Secondary | ICD-10-CM | POA: Diagnosis not present

## 2013-11-19 ENCOUNTER — Encounter: Payer: Self-pay | Admitting: Family Medicine

## 2013-11-19 ENCOUNTER — Telehealth: Payer: Self-pay | Admitting: Family Medicine

## 2013-11-19 DIAGNOSIS — M5137 Other intervertebral disc degeneration, lumbosacral region: Secondary | ICD-10-CM | POA: Diagnosis not present

## 2013-11-19 NOTE — Telephone Encounter (Signed)
drug change request  nexium  Message: patient is requesting a less expensive alternative to nexium. Please fax back with approval along with strength, directions, quantity, and refills.

## 2013-11-20 MED ORDER — OMEPRAZOLE 20 MG PO CPDR: 20.0000 mg | DELAYED_RELEASE_CAPSULE | Freq: Every day | ORAL | Status: DC

## 2013-11-20 NOTE — Telephone Encounter (Signed)
Already done

## 2013-11-21 DIAGNOSIS — M5137 Other intervertebral disc degeneration, lumbosacral region: Secondary | ICD-10-CM | POA: Diagnosis not present

## 2013-11-26 DIAGNOSIS — M5137 Other intervertebral disc degeneration, lumbosacral region: Secondary | ICD-10-CM | POA: Diagnosis not present

## 2014-01-18 DIAGNOSIS — M899 Disorder of bone, unspecified: Secondary | ICD-10-CM | POA: Diagnosis not present

## 2014-01-18 DIAGNOSIS — M949 Disorder of cartilage, unspecified: Secondary | ICD-10-CM | POA: Diagnosis not present

## 2014-01-18 LAB — HM DEXA SCAN

## 2014-02-04 ENCOUNTER — Encounter: Payer: Self-pay | Admitting: Family Medicine

## 2014-02-05 ENCOUNTER — Telehealth: Payer: Self-pay

## 2014-02-05 NOTE — Telephone Encounter (Signed)
Per MD- notify stable osteopenia, no change

## 2014-02-05 NOTE — Telephone Encounter (Signed)
Notified pt. 

## 2014-02-20 DIAGNOSIS — L919 Hypertrophic disorder of the skin, unspecified: Secondary | ICD-10-CM | POA: Diagnosis not present

## 2014-02-20 DIAGNOSIS — D1801 Hemangioma of skin and subcutaneous tissue: Secondary | ICD-10-CM | POA: Diagnosis not present

## 2014-02-20 DIAGNOSIS — Z85820 Personal history of malignant melanoma of skin: Secondary | ICD-10-CM | POA: Diagnosis not present

## 2014-02-20 DIAGNOSIS — D239 Other benign neoplasm of skin, unspecified: Secondary | ICD-10-CM | POA: Diagnosis not present

## 2014-02-20 DIAGNOSIS — L821 Other seborrheic keratosis: Secondary | ICD-10-CM | POA: Diagnosis not present

## 2014-02-20 DIAGNOSIS — L909 Atrophic disorder of skin, unspecified: Secondary | ICD-10-CM | POA: Diagnosis not present

## 2014-02-20 DIAGNOSIS — L819 Disorder of pigmentation, unspecified: Secondary | ICD-10-CM | POA: Diagnosis not present

## 2014-02-20 DIAGNOSIS — D692 Other nonthrombocytopenic purpura: Secondary | ICD-10-CM | POA: Diagnosis not present

## 2014-02-21 ENCOUNTER — Encounter: Payer: Self-pay | Admitting: Family Medicine

## 2014-06-07 DIAGNOSIS — Z1231 Encounter for screening mammogram for malignant neoplasm of breast: Secondary | ICD-10-CM | POA: Diagnosis not present

## 2014-06-07 DIAGNOSIS — Z803 Family history of malignant neoplasm of breast: Secondary | ICD-10-CM | POA: Diagnosis not present

## 2014-06-07 LAB — HM MAMMOGRAPHY

## 2014-06-13 ENCOUNTER — Encounter: Payer: Self-pay | Admitting: Family Medicine

## 2014-07-24 ENCOUNTER — Other Ambulatory Visit: Payer: Self-pay | Admitting: Family Medicine

## 2014-07-24 DIAGNOSIS — Z23 Encounter for immunization: Secondary | ICD-10-CM | POA: Diagnosis not present

## 2014-07-24 NOTE — Telephone Encounter (Signed)
Refills sent to pharmacy for amlodipine and synthroid. Pt is due for fasting medicare wellness exam. Please call pt to arrange appt.

## 2014-10-10 DIAGNOSIS — Z961 Presence of intraocular lens: Secondary | ICD-10-CM | POA: Diagnosis not present

## 2015-01-22 ENCOUNTER — Other Ambulatory Visit: Payer: Self-pay | Admitting: Family Medicine

## 2015-01-28 ENCOUNTER — Ambulatory Visit (HOSPITAL_BASED_OUTPATIENT_CLINIC_OR_DEPARTMENT_OTHER)
Admission: RE | Admit: 2015-01-28 | Discharge: 2015-01-28 | Disposition: A | Discharge status: Home / Self Care | Payer: Medicare Other | Source: Ambulatory Visit | Attending: Family Medicine | Admitting: Family Medicine

## 2015-01-28 ENCOUNTER — Ambulatory Visit (INDEPENDENT_AMBULATORY_CARE_PROVIDER_SITE_OTHER): Payer: Medicare Other | Admitting: Family Medicine

## 2015-01-28 ENCOUNTER — Encounter: Payer: Self-pay | Admitting: Family Medicine

## 2015-01-28 VITALS — BP 124/82 | HR 80 | Temp 97.9°F | Resp 16 | Ht 62.0 in | Wt 137.4 lb

## 2015-01-28 DIAGNOSIS — E785 Hyperlipidemia, unspecified: Secondary | ICD-10-CM | POA: Diagnosis not present

## 2015-01-28 DIAGNOSIS — I10 Essential (primary) hypertension: Secondary | ICD-10-CM

## 2015-01-28 DIAGNOSIS — E559 Vitamin D deficiency, unspecified: Secondary | ICD-10-CM

## 2015-01-28 DIAGNOSIS — M8580 Other specified disorders of bone density and structure, unspecified site: Secondary | ICD-10-CM | POA: Diagnosis not present

## 2015-01-28 DIAGNOSIS — D649 Anemia, unspecified: Secondary | ICD-10-CM | POA: Diagnosis not present

## 2015-01-28 DIAGNOSIS — K2100 Gastro-esophageal reflux disease with esophagitis, without bleeding: Secondary | ICD-10-CM

## 2015-01-28 DIAGNOSIS — M542 Cervicalgia: Secondary | ICD-10-CM | POA: Diagnosis not present

## 2015-01-28 DIAGNOSIS — M5032 Other cervical disc degeneration, mid-cervical region: Secondary | ICD-10-CM | POA: Diagnosis not present

## 2015-01-28 DIAGNOSIS — E079 Disorder of thyroid, unspecified: Secondary | ICD-10-CM

## 2015-01-28 DIAGNOSIS — Z0000 Encounter for general adult medical examination without abnormal findings: Secondary | ICD-10-CM | POA: Diagnosis not present

## 2015-01-28 DIAGNOSIS — M47812 Spondylosis without myelopathy or radiculopathy, cervical region: Secondary | ICD-10-CM | POA: Diagnosis not present

## 2015-01-28 LAB — CBC
HCT: 40.8 % (ref 36.0–46.0)
HEMOGLOBIN: 13.6 g/dL (ref 12.0–15.0)
MCHC: 33.3 g/dL (ref 30.0–36.0)
MCV: 88.1 fl (ref 78.0–100.0)
Platelets: 249 10*3/uL (ref 150.0–400.0)
RBC: 4.63 Mil/uL (ref 3.87–5.11)
RDW: 14 % (ref 11.5–15.5)
WBC: 8.2 10*3/uL (ref 4.0–10.5)

## 2015-01-28 LAB — LIPID PANEL
CHOLESTEROL: 231 mg/dL — AB (ref 0–200)
HDL: 47.3 mg/dL (ref >39.00)
LDL CALC: 147 mg/dL — AB (ref 0–99)
NonHDL: 183.7
TRIGLYCERIDES: 186 mg/dL — AB (ref 0.0–149.0)
Total CHOL/HDL Ratio: 5
VLDL: 37.2 mg/dL (ref 0.0–40.0)

## 2015-01-28 LAB — VITAMIN D 25 HYDROXY (VIT D DEFICIENCY, FRACTURES): VITD: 24.64 ng/mL — ABNORMAL LOW (ref 30.00–100.00)

## 2015-01-28 LAB — COMPREHENSIVE METABOLIC PANEL
ALT: 25 U/L (ref 0–35)
AST: 22 U/L (ref 0–37)
Albumin: 4.7 g/dL (ref 3.5–5.2)
Alkaline Phosphatase: 75 U/L (ref 39–117)
BILIRUBIN TOTAL: 0.6 mg/dL (ref 0.2–1.2)
BUN: 13 mg/dL (ref 6–23)
CHLORIDE: 106 meq/L (ref 96–112)
CO2: 27 meq/L (ref 19–32)
Calcium: 9.8 mg/dL (ref 8.4–10.5)
Creatinine, Ser: 0.67 mg/dL (ref 0.40–1.20)
GFR: 93.26 mL/min (ref >60.00)
Glucose, Bld: 98 mg/dL (ref 70–99)
POTASSIUM: 4 meq/L (ref 3.5–5.1)
Sodium: 141 mEq/L (ref 135–145)
Total Protein: 7.5 g/dL (ref 6.0–8.3)

## 2015-01-28 LAB — TSH: TSH: 2.05 u[IU]/mL (ref 0.35–4.50)

## 2015-01-28 MED ORDER — RANITIDINE HCL 300 MG PO TABS: 300.0000 mg | ORAL_TABLET | Freq: Every day | ORAL | Status: DC

## 2015-01-28 MED ORDER — OMEPRAZOLE 20 MG PO CPDR: 20.0000 mg | DELAYED_RELEASE_CAPSULE | Freq: Every day | ORAL | Status: DC

## 2015-01-28 NOTE — Patient Instructions (Signed)
Alternate Ranitidine and Omeprazole  Preventive Care for Adults A healthy lifestyle and preventive care can promote health and wellness. Preventive health guidelines for women include the following key practices.  A routine yearly physical is a good way to check with your health care provider about your health and preventive screening. It is a chance to share any concerns and updates on your health and to receive a thorough exam.  Visit your dentist for a routine exam and preventive care every 6 months. Brush your teeth twice a day and floss once a day. Good oral hygiene prevents tooth decay and gum disease.  The frequency of eye exams is based on your age, health, family medical history, use of contact lenses, and other factors. Follow your health care provider's recommendations for frequency of eye exams.  Eat a healthy diet. Foods like vegetables, fruits, whole grains, low-fat dairy products, and lean protein foods contain the nutrients you need without too many calories. Decrease your intake of foods high in solid fats, added sugars, and salt. Eat the right amount of calories for you.Get information about a proper diet from your health care provider, if necessary.  Regular physical exercise is one of the most important things you can do for your health. Most adults should get at least 150 minutes of moderate-intensity exercise (any activity that increases your heart rate and causes you to sweat) each week. In addition, most adults need muscle-strengthening exercises on 2 or more days a week.  Maintain a healthy weight. The body mass index (BMI) is a screening tool to identify possible weight problems. It provides an estimate of body fat based on height and weight. Your health care provider can find your BMI and can help you achieve or maintain a healthy weight.For adults 20 years and older:  A BMI below 18.5 is considered underweight.  A BMI of 18.5 to 24.9 is normal.  A BMI of 25 to 29.9  is considered overweight.  A BMI of 30 and above is considered obese.  Maintain normal blood lipids and cholesterol levels by exercising and minimizing your intake of saturated fat. Eat a balanced diet with plenty of fruit and vegetables. Blood tests for lipids and cholesterol should begin at age 70 and be repeated every 5 years. If your lipid or cholesterol levels are high, you are over 50, or you are at high risk for heart disease, you may need your cholesterol levels checked more frequently.Ongoing high lipid and cholesterol levels should be treated with medicines if diet and exercise are not working.  If you smoke, find out from your health care provider how to quit. If you do not use tobacco, do not start.  Lung cancer screening is recommended for adults aged 44-80 years who are at high risk for developing lung cancer because of a history of smoking. A yearly low-dose CT scan of the lungs is recommended for people who have at least a 30-pack-year history of smoking and are a current smoker or have quit within the past 15 years. A pack year of smoking is smoking an average of 1 pack of cigarettes a day for 1 year (for example: 1 pack a day for 30 years or 2 packs a day for 15 years). Yearly screening should continue until the smoker has stopped smoking for at least 15 years. Yearly screening should be stopped for people who develop a health problem that would prevent them from having lung cancer treatment.  If you are pregnant, do not drink  alcohol. If you are breastfeeding, be very cautious about drinking alcohol. If you are not pregnant and choose to drink alcohol, do not have more than 1 drink per day. One drink is considered to be 12 ounces (355 mL) of beer, 5 ounces (148 mL) of wine, or 1.5 ounces (44 mL) of liquor.  Avoid use of street drugs. Do not share needles with anyone. Ask for help if you need support or instructions about stopping the use of drugs.  High blood pressure causes heart  disease and increases the risk of stroke. Your blood pressure should be checked at least every 1 to 2 years. Ongoing high blood pressure should be treated with medicines if weight loss and exercise do not work.  If you are 52-76 years old, ask your health care provider if you should take aspirin to prevent strokes.  Diabetes screening involves taking a blood sample to check your fasting blood sugar level. This should be done once every 3 years, after age 66, if you are within normal weight and without risk factors for diabetes. Testing should be considered at a younger age or be carried out more frequently if you are overweight and have at least 1 risk factor for diabetes.  Breast cancer screening is essential preventive care for women. You should practice "breast self-awareness." This means understanding the normal appearance and feel of your breasts and may include breast self-examination. Any changes detected, no matter how small, should be reported to a health care provider. Women in their 57s and 30s should have a clinical breast exam (CBE) by a health care provider as part of a regular health exam every 1 to 3 years. After age 6, women should have a CBE every year. Starting at age 79, women should consider having a mammogram (breast X-ray test) every year. Women who have a family history of breast cancer should talk to their health care provider about genetic screening. Women at a high risk of breast cancer should talk to their health care providers about having an MRI and a mammogram every year.  Breast cancer gene (BRCA)-related cancer risk assessment is recommended for women who have family members with BRCA-related cancers. BRCA-related cancers include breast, ovarian, tubal, and peritoneal cancers. Having family members with these cancers may be associated with an increased risk for harmful changes (mutations) in the breast cancer genes BRCA1 and BRCA2. Results of the assessment will determine  the need for genetic counseling and BRCA1 and BRCA2 testing.  Routine pelvic exams to screen for cancer are no longer recommended for nonpregnant women who are considered low risk for cancer of the pelvic organs (ovaries, uterus, and vagina) and who do not have symptoms. Ask your health care provider if a screening pelvic exam is right for you.  If you have had past treatment for cervical cancer or a condition that could lead to cancer, you need Pap tests and screening for cancer for at least 20 years after your treatment. If Pap tests have been discontinued, your risk factors (such as having a new sexual partner) need to be reassessed to determine if screening should be resumed. Some women have medical problems that increase the chance of getting cervical cancer. In these cases, your health care provider may recommend more frequent screening and Pap tests.  The HPV test is an additional test that may be used for cervical cancer screening. The HPV test looks for the virus that can cause the cell changes on the cervix. The cells collected during  the Pap test can be tested for HPV. The HPV test could be used to screen women aged 38 years and older, and should be used in women of any age who have unclear Pap test results. After the age of 59, women should have HPV testing at the same frequency as a Pap test.  Colorectal cancer can be detected and often prevented. Most routine colorectal cancer screening begins at the age of 35 years and continues through age 38 years. However, your health care provider may recommend screening at an earlier age if you have risk factors for colon cancer. On a yearly basis, your health care provider may provide home test kits to check for hidden blood in the stool. Use of a small camera at the end of a tube, to directly examine the colon (sigmoidoscopy or colonoscopy), can detect the earliest forms of colorectal cancer. Talk to your health care provider about this at age 64, when  routine screening begins. Direct exam of the colon should be repeated every 5-10 years through age 18 years, unless early forms of pre-cancerous polyps or small growths are found.  People who are at an increased risk for hepatitis B should be screened for this virus. You are considered at high risk for hepatitis B if:  You were born in a country where hepatitis B occurs often. Talk with your health care provider about which countries are considered high risk.  Your parents were born in a high-risk country and you have not received a shot to protect against hepatitis B (hepatitis B vaccine).  You have HIV or AIDS.  You use needles to inject street drugs.  You live with, or have sex with, someone who has hepatitis B.  You get hemodialysis treatment.  You take certain medicines for conditions like cancer, organ transplantation, and autoimmune conditions.  Hepatitis C blood testing is recommended for all people born from [birth year] through 1965 and any individual with known risks for hepatitis C.  Practice safe sex. Use condoms and avoid high-risk sexual practices to reduce the spread of sexually transmitted infections (STIs). STIs include gonorrhea, chlamydia, syphilis, trichomonas, herpes, HPV, and human immunodeficiency virus (HIV). Herpes, HIV, and HPV are viral illnesses that have no cure. They can result in disability, cancer, and death.  You should be screened for sexually transmitted illnesses (STIs) including gonorrhea and chlamydia if:  You are sexually active and are younger than 24 years.  You are older than 24 years and your health care provider tells you that you are at risk for this type of infection.  Your sexual activity has changed since you were last screened and you are at an increased risk for chlamydia or gonorrhea. Ask your health care provider if you are at risk.  If you are at risk of being infected with HIV, it is recommended that you take a prescription medicine daily  to prevent HIV infection. This is called preexposure prophylaxis (PrEP). You are considered at risk if:  You are a heterosexual woman, are sexually active, and are at increased risk for HIV infection.  You take drugs by injection.  You are sexually active with a partner who has HIV.  Talk with your health care provider about whether you are at high risk of being infected with HIV. If you choose to begin PrEP, you should first be tested for HIV. You should then be tested every 3 months for as long as you are taking PrEP.  Osteoporosis is a disease in which the  bones lose minerals and strength with aging. This can result in serious bone fractures or breaks. The risk of osteoporosis can be identified using a bone density scan. Women ages 8 years and over and women at risk for fractures or osteoporosis should discuss screening with their health care providers. Ask your health care provider whether you should take a calcium supplement or vitamin D to reduce the rate of osteoporosis.  Menopause can be associated with physical symptoms and risks. Hormone replacement therapy is available to decrease symptoms and risks. You should talk to your health care provider about whether hormone replacement therapy is right for you.  Use sunscreen. Apply sunscreen liberally and repeatedly throughout the day. You should seek shade when your shadow is shorter than you. Protect yourself by wearing long sleeves, pants, a wide-brimmed hat, and sunglasses year round, whenever you are outdoors.  Once a month, do a whole body skin exam, using a mirror to look at the skin on your back. Tell your health care provider of new moles, moles that have irregular borders, moles that are larger than a pencil eraser, or moles that have changed in shape or color.  Stay current with required vaccines (immunizations).  Influenza vaccine. All adults should be immunized every year.  Tetanus, diphtheria, and acellular pertussis (Td,  Tdap) vaccine. Pregnant women should receive 1 dose of Tdap vaccine during each pregnancy. The dose should be obtained regardless of the length of time since the last dose. Immunization is preferred during the 27th-36th week of gestation. An adult who has not previously received Tdap or who does not know her vaccine status should receive 1 dose of Tdap. This initial dose should be followed by tetanus and diphtheria toxoids (Td) booster doses every 10 years. Adults with an unknown or incomplete history of completing a 3-dose immunization series with Td-containing vaccines should begin or complete a primary immunization series including a Tdap dose. Adults should receive a Td booster every 10 years.  Varicella vaccine. An adult without evidence of immunity to varicella should receive 2 doses or a second dose if she has previously received 1 dose. Pregnant females who do not have evidence of immunity should receive the first dose after pregnancy. This first dose should be obtained before leaving the health care facility. The second dose should be obtained 4-8 weeks after the first dose.  Human papillomavirus (HPV) vaccine. Females aged 13-26 years who have not received the vaccine previously should obtain the 3-dose series. The vaccine is not recommended for use in pregnant females. However, pregnancy testing is not needed before receiving a dose. If a female is found to be pregnant after receiving a dose, no treatment is needed. In that case, the remaining doses should be delayed until after the pregnancy. Immunization is recommended for any person with an immunocompromised condition through the age of 78 years if she did not get any or all doses earlier. During the 3-dose series, the second dose should be obtained 4-8 weeks after the first dose. The third dose should be obtained 24 weeks after the first dose and 16 weeks after the second dose.  Zoster vaccine. One dose is recommended for adults aged 44 years or  older unless certain conditions are present.  Measles, mumps, and rubella (MMR) vaccine. Adults born before [birth year] generally are considered immune to measles and mumps. Adults born in [birth year] or later should have 1 or more doses of MMR vaccine unless there is a contraindication to the vaccine or there is  laboratory evidence of immunity to each of the three diseases. A routine second dose of MMR vaccine should be obtained at least 28 days after the first dose for students attending postsecondary schools, health care workers, or international travelers. People who received inactivated measles vaccine or an unknown type of measles vaccine during 1963-1967 should receive 2 doses of MMR vaccine. People who received inactivated mumps vaccine or an unknown type of mumps vaccine before 1979 and are at high risk for mumps infection should consider immunization with 2 doses of MMR vaccine. For females of childbearing age, rubella immunity should be determined. If there is no evidence of immunity, females who are not pregnant should be vaccinated. If there is no evidence of immunity, females who are pregnant should delay immunization until after pregnancy. Unvaccinated health care workers born before [birth year] who lack laboratory evidence of measles, mumps, or rubella immunity or laboratory confirmation of disease should consider measles and mumps immunization with 2 doses of MMR vaccine or rubella immunization with 1 dose of MMR vaccine.  Pneumococcal 13-valent conjugate (PCV13) vaccine. When indicated, a person who is uncertain of her immunization history and has no record of immunization should receive the PCV13 vaccine. An adult aged 51 years or older who has certain medical conditions and has not been previously immunized should receive 1 dose of PCV13 vaccine. This PCV13 should be followed with a dose of pneumococcal polysaccharide (PPSV23) vaccine. The PPSV23 vaccine dose should be obtained at least 8 weeks after the dose of  PCV13 vaccine. An adult aged 36 years or older who has certain medical conditions and previously received 1 or more doses of PPSV23 vaccine should receive 1 dose of PCV13. The PCV13 vaccine dose should be obtained 1 or more years after the last PPSV23 vaccine dose.  Pneumococcal polysaccharide (PPSV23) vaccine. When PCV13 is also indicated, PCV13 should be obtained first. All adults aged 63 years and older should be immunized. An adult younger than age 58 years who has certain medical conditions should be immunized. Any person who resides in a nursing home or long-term care facility should be immunized. An adult smoker should be immunized. People with an immunocompromised condition and certain other conditions should receive both PCV13 and PPSV23 vaccines. People with human immunodeficiency virus (HIV) infection should be immunized as soon as possible after diagnosis. Immunization during chemotherapy or radiation therapy should be avoided. Routine use of PPSV23 vaccine is not recommended for American Indians, [place] Natives, or people younger than 65 years unless there are medical conditions that require PPSV23 vaccine. When indicated, people who have unknown immunization and have no record of immunization should receive PPSV23 vaccine. One-time revaccination 5 years after the first dose of PPSV23 is recommended for people aged 19-64 years who have chronic kidney failure, nephrotic syndrome, asplenia, or immunocompromised conditions. People who received 1-2 doses of PPSV23 before age 37 years should receive another dose of PPSV23 vaccine at age 67 years or later if at least 5 years have passed since the previous dose. Doses of PPSV23 are not needed for people immunized with PPSV23 at or after age 54 years.  Meningococcal vaccine. Adults with asplenia or persistent complement component deficiencies should receive 2 doses of quadrivalent meningococcal conjugate (MenACWY-D) vaccine. The doses should be obtained at  least 2 months apart. Microbiologists working with certain meningococcal bacteria, [employer] recruits, people at risk during an outbreak, and people who travel to or live in countries with a high rate of meningitis should be immunized. A first-year  college student up through age 41 years who is living in a residence hall should receive a dose if she did not receive a dose on or after her 16th birthday. Adults who have certain high-risk conditions should receive one or more doses of vaccine.  Hepatitis A vaccine. Adults who wish to be protected from this disease, have certain high-risk conditions, work with hepatitis A-infected animals, work in hepatitis A research labs, or travel to or work in countries with a high rate of hepatitis A should be immunized. Adults who were previously unvaccinated and who anticipate close contact with an international adoptee during the first 60 days after arrival in the [place] from a country with a high rate of hepatitis A should be immunized.  Hepatitis B vaccine. Adults who wish to be protected from this disease, have certain high-risk conditions, may be exposed to blood or other infectious body fluids, are household contacts or sex partners of hepatitis B positive people, are clients or workers in certain care facilities, or travel to or work in countries with a high rate of hepatitis B should be immunized.  Haemophilus influenzae type b (Hib) vaccine. A previously unvaccinated person with asplenia or sickle cell disease or having a scheduled splenectomy should receive 1 dose of Hib vaccine. Regardless of previous immunization, a recipient of a hematopoietic stem cell transplant should receive a 3-dose series 6-12 months after her successful transplant. Hib vaccine is not recommended for adults with HIV infection. Preventive Services / Frequency Ages 69 to 31 years  Blood pressure check.** / Every 1 to 2 years.  Lipid and cholesterol check.** / Every 5 years  beginning at age 28.  Clinical breast exam.** / Every 3 years for women in their 67s and 68s.  BRCA-related cancer risk assessment.** / For women who have family members with a BRCA-related cancer (breast, ovarian, tubal, or peritoneal cancers).  Pap test.** / Every 2 years from ages 58 through 81. Every 3 years starting at age 20 through age 22 or 73 with a history of 3 consecutive normal Pap tests.  HPV screening.** / Every 3 years from ages 9 through ages 24 to 29 with a history of 3 consecutive normal Pap tests.  Hepatitis C blood test.** / For any individual with known risks for hepatitis C.  Skin self-exam. / Monthly.  Influenza vaccine. / Every year.  Tetanus, diphtheria, and acellular pertussis (Tdap, Td) vaccine.** / Consult your health care provider. Pregnant women should receive 1 dose of Tdap vaccine during each pregnancy. 1 dose of Td every 10 years.  Varicella vaccine.** / Consult your health care provider. Pregnant females who do not have evidence of immunity should receive the first dose after pregnancy.  HPV vaccine. / 3 doses over 6 months, if 27 and younger. The vaccine is not recommended for use in pregnant females. However, pregnancy testing is not needed before receiving a dose.  Measles, mumps, rubella (MMR) vaccine.** / You need at least 1 dose of MMR if you were born in 1957 or later. You may also need a 2nd dose. For females of childbearing age, rubella immunity should be determined. If there is no evidence of immunity, females who are not pregnant should be vaccinated. If there is no evidence of immunity, females who are pregnant should delay immunization until after pregnancy.  Pneumococcal 13-valent conjugate (PCV13) vaccine.** / Consult your health care provider.  Pneumococcal polysaccharide (PPSV23) vaccine.** / 1 to 2 doses if you smoke cigarettes or if you have  certain conditions.  Meningococcal vaccine.** / 1 dose if you are age 75 to 13 years and a  Market researcher living in a residence hall, or have one of several medical conditions, you need to get vaccinated against meningococcal disease. You may also need additional booster doses.  Hepatitis A vaccine.** / Consult your health care provider.  Hepatitis B vaccine.** / Consult your health care provider.  Haemophilus influenzae type b (Hib) vaccine.** / Consult your health care provider. Ages 70 to 75 years  Blood pressure check.** / Every 1 to 2 years.  Lipid and cholesterol check.** / Every 5 years beginning at age 62 years.  Lung cancer screening. / Every year if you are aged 33-80 years and have a 30-pack-year history of smoking and currently smoke or have quit within the past 15 years. Yearly screening is stopped once you have quit smoking for at least 15 years or develop a health problem that would prevent you from having lung cancer treatment.  Clinical breast exam.** / Every year after age 33 years.  BRCA-related cancer risk assessment.** / For women who have family members with a BRCA-related cancer (breast, ovarian, tubal, or peritoneal cancers).  Mammogram.** / Every year beginning at age 39 years and continuing for as long as you are in good health. Consult with your health care provider.  Pap test.** / Every 3 years starting at age 65 years through age 29 or 42 years with a history of 3 consecutive normal Pap tests.  HPV screening.** / Every 3 years from ages 70 years through ages 66 to 42 years with a history of 3 consecutive normal Pap tests.  Fecal occult blood test (FOBT) of stool. / Every year beginning at age 14 years and continuing until age 13 years. You may not need to do this test if you get a colonoscopy every 10 years.  Flexible sigmoidoscopy or colonoscopy.** / Every 5 years for a flexible sigmoidoscopy or every 10 years for a colonoscopy beginning at age 79 years and continuing until age 83 years.  Hepatitis C blood test.** / For all people  born from [birth year] through 1963 and any individual with known risks for hepatitis C.  Skin self-exam. / Monthly.  Influenza vaccine. / Every year.  Tetanus, diphtheria, and acellular pertussis (Tdap/Td) vaccine.** / Consult your health care provider. Pregnant women should receive 1 dose of Tdap vaccine during each pregnancy. 1 dose of Td every 10 years.  Varicella vaccine.** / Consult your health care provider. Pregnant females who do not have evidence of immunity should receive the first dose after pregnancy.  Zoster vaccine.** / 1 dose for adults aged 78 years or older.  Measles, mumps, rubella (MMR) vaccine.** / You need at least 1 dose of MMR if you were born in 1957 or later. You may also need a 2nd dose. For females of childbearing age, rubella immunity should be determined. If there is no evidence of immunity, females who are not pregnant should be vaccinated. If there is no evidence of immunity, females who are pregnant should delay immunization until after pregnancy.  Pneumococcal 13-valent conjugate (PCV13) vaccine.** / Consult your health care provider.  Pneumococcal polysaccharide (PPSV23) vaccine.** / 1 to 2 doses if you smoke cigarettes or if you have certain conditions.  Meningococcal vaccine.** / Consult your health care provider.  Hepatitis A vaccine.** / Consult your health care provider.  Hepatitis B vaccine.** / Consult your health care provider.  Haemophilus influenzae type b (Hib) vaccine.** / Consult  your health care provider. Ages 74 years and over  Blood pressure check.** / Every 1 to 2 years.  Lipid and cholesterol check.** / Every 5 years beginning at age 87 years.  Lung cancer screening. / Every year if you are aged 49-80 years and have a 30-pack-year history of smoking and currently smoke or have quit within the past 15 years. Yearly screening is stopped once you have quit smoking for at least 15 years or develop a health problem that would prevent you from  having lung cancer treatment.  Clinical breast exam.** / Every year after age 48 years.  BRCA-related cancer risk assessment.** / For women who have family members with a BRCA-related cancer (breast, ovarian, tubal, or peritoneal cancers).  Mammogram.** / Every year beginning at age 39 years and continuing for as long as you are in good health. Consult with your health care provider.  Pap test.** / Every 3 years starting at age 36 years through age 56 or 75 years with 3 consecutive normal Pap tests. Testing can be stopped between 65 and 70 years with 3 consecutive normal Pap tests and no abnormal Pap or HPV tests in the past 10 years.  HPV screening.** / Every 3 years from ages 58 years through ages 93 or 64 years with a history of 3 consecutive normal Pap tests. Testing can be stopped between 65 and 70 years with 3 consecutive normal Pap tests and no abnormal Pap or HPV tests in the past 10 years.  Fecal occult blood test (FOBT) of stool. / Every year beginning at age 18 years and continuing until age 2 years. You may not need to do this test if you get a colonoscopy every 10 years.  Flexible sigmoidoscopy or colonoscopy.** / Every 5 years for a flexible sigmoidoscopy or every 10 years for a colonoscopy beginning at age 46 years and continuing until age 68 years.  Hepatitis C blood test.** / For all people born from [birth year] through 1963 and any individual with known risks for hepatitis C.  Osteoporosis screening.** / A one-time screening for women ages 54 years and over and women at risk for fractures or osteoporosis.  Skin self-exam. / Monthly.  Influenza vaccine. / Every year.  Tetanus, diphtheria, and acellular pertussis (Tdap/Td) vaccine.** / 1 dose of Td every 10 years.  Varicella vaccine.** / Consult your health care provider.  Zoster vaccine.** / 1 dose for adults aged 108 years or older.  Pneumococcal 13-valent conjugate (PCV13) vaccine.** / Consult your health care  provider.  Pneumococcal polysaccharide (PPSV23) vaccine.** / 1 dose for all adults aged 55 years and older.  Meningococcal vaccine.** / Consult your health care provider.  Hepatitis A vaccine.** / Consult your health care provider.  Hepatitis B vaccine.** / Consult your health care provider.  Haemophilus influenzae type b (Hib) vaccine.** / Consult your health care provider. ** Family history and personal history of risk and conditions may change your health care provider's recommendations. Document Released: 12/21/2001 Document Revised: 03/11/2014 Document Reviewed: 03/22/2011 [employer] Patient Information 2015 [place], [place]. This information is not intended to replace advice given to you by your health care provider. Make sure you discuss any questions you have with your health care provider.

## 2015-01-28 NOTE — Progress Notes (Signed)
Pre visit review using our clinic review tool, if applicable. No additional management support is needed unless otherwise documented below in the visit note. 

## 2015-01-28 NOTE — Assessment & Plan Note (Addendum)
Is taking Omeprazole and it helps some at 20 mg but still struggles. Avoid offending foods, start probiotics. Do not eat large meals in late evening and consider raising head of bed. Try alternating Ranitidine and Omeprazole and may need referral if symptoms do not improve

## 2015-01-28 NOTE — Assessment & Plan Note (Signed)
Well controlled, no changes to meds. Encouraged heart healthy diet such as the DASH diet and exercise as tolerated.  °

## 2015-01-29 ENCOUNTER — Other Ambulatory Visit: Payer: Self-pay | Admitting: Family Medicine

## 2015-01-29 ENCOUNTER — Other Ambulatory Visit: Payer: Self-pay | Admitting: General Practice

## 2015-01-29 DIAGNOSIS — E559 Vitamin D deficiency, unspecified: Secondary | ICD-10-CM

## 2015-01-29 DIAGNOSIS — M542 Cervicalgia: Secondary | ICD-10-CM

## 2015-01-29 MED ORDER — VITAMIN D (ERGOCALCIFEROL) 1.25 MG (50000 UNIT) PO CAPS: 50000.0000 [IU] | ORAL_CAPSULE | ORAL | Status: DC

## 2015-01-30 ENCOUNTER — Encounter: Payer: Self-pay | Admitting: Family Medicine

## 2015-02-03 ENCOUNTER — Ambulatory Visit (INDEPENDENT_AMBULATORY_CARE_PROVIDER_SITE_OTHER): Payer: Medicare Other

## 2015-02-03 DIAGNOSIS — M542 Cervicalgia: Secondary | ICD-10-CM

## 2015-02-03 DIAGNOSIS — M5032 Other cervical disc degeneration, mid-cervical region: Secondary | ICD-10-CM | POA: Diagnosis not present

## 2015-02-03 DIAGNOSIS — M47812 Spondylosis without myelopathy or radiculopathy, cervical region: Secondary | ICD-10-CM | POA: Diagnosis not present

## 2015-02-03 DIAGNOSIS — M5022 Other cervical disc displacement, mid-cervical region: Secondary | ICD-10-CM | POA: Diagnosis not present

## 2015-02-03 DIAGNOSIS — M47892 Other spondylosis, cervical region: Secondary | ICD-10-CM | POA: Diagnosis not present

## 2015-02-09 ENCOUNTER — Encounter: Payer: Self-pay | Admitting: Family Medicine

## 2015-02-09 DIAGNOSIS — Z0000 Encounter for general adult medical examination without abnormal findings: Secondary | ICD-10-CM | POA: Insufficient documentation

## 2015-02-09 NOTE — Assessment & Plan Note (Signed)
On Levothyroxine, continue to monitor 

## 2015-02-09 NOTE — Assessment & Plan Note (Addendum)
Patient denies any difficulties at home. No trouble with ADLs, depression or falls. No recent changes to vision or hearing. Is UTD with immunizations. Is UTD with screening. Discussed Advanced Directives, patient agrees to bring [place] copies of documents if can. Encouraged heart healthy diet, exercise as tolerated and adequate sleep. MGM on 05/28/2014 Dexa scan in 2015 Immunizations UTD Colonoscopy in 2007, repeat needed in 2017 Pap in 2015, repeat in 3 years. Sees Dr Jenne Pane of dermatology She will check with her insurance to make sure the will cover Pneumovax and will return for shot if they will

## 2015-02-09 NOTE — Assessment & Plan Note (Signed)
Encouraged heart healthy diet, increase exercise, avoid trans fats, consider a krill oil cap daily 

## 2015-02-09 NOTE — Assessment & Plan Note (Signed)
Noted to be low on labwork, started on Vitamin D 50000 IU weekly and recheck in 12 weeks.

## 2015-02-09 NOTE — Progress Notes (Signed)
Tammy Grimes  [id number] [birth year] 02/09/2015      Progress Note-Follow Up  Subjective  Chief Complaint  Chief Complaint  Patient presents with  . Medicare Wellness    HPI  Patient is a 70 y.o. female in today for routine medical care. Patient is in today for annual exam. Continues to struggle with some shoulder pain and back pain but is using Tylenol with some relief. Has seen orthopedics and had physical therapy with some results as well. No recent injury or fall. Has had trouble with the right shoulder for years and recently is struggling with the left shoulder as well. Reports her last colonoscopy was in 2007 and was to be repeated in 2017. Is having a slight increase in heartburn lately with symptoms several times a week. No bloody or tarry stool. No constipation or diarrhea. No sore throat or cough. Denies CP/palp/SOB/HA/congestion/fevers or GU c/o. Taking meds as prescribed  Past Medical History  Diagnosis Date  . GERD (gastroesophageal reflux disease)   . Hypertension 65  . Cancer 2007    calf of right leg- melanoma  . Anemia     prior to hysterectomy  . Allergy   . Hyperlipidemia   . Thyroid disease 64  . Preventative health care 03/30/2012  . Knee pain 03/30/2012    L>R  . Cervical cancer screening 06/06/2012  . Vitamin D deficiency 06/06/2012  . Medicare annual wellness visit, subsequent 02/09/2015    Past Surgical History  Procedure Laterality Date  . Breast surgery  early 70's    fibroid tumors removed- benign  . Abdominal hysterectomy  1990's    total, for heavy bleeding and fibroids  . Cataract extraction Bilateral 12/09/12    Family History  Problem Relation Age of Onset  . Hypertension Mother   . Alzheimer's disease Mother   . Dementia Mother     alzheimer's  . Aortic aneurysm Father   . Hypertension Father   . COPD Father     smoker  . Heart disease Father     s/p bypass, aortic aneurysm, carotid artery disease  . Cancer Sister 4    breast-  remission  . Diabetes Son 22    type 1  . Alzheimer's disease Maternal Grandmother   . Cancer Maternal Grandfather     prostate  . Stroke Paternal Grandfather     History   Social History  . Marital Status: Married    Spouse Name: N/A  . Number of Children: N/A  . Years of Education: N/A   Occupational History  . Not on file.   Social History Main Topics  . Smoking status: Never Smoker   . Smokeless tobacco: Never Used  . Alcohol Use: No  . Drug Use: No  . Sexual Activity: Not Currently   Other Topics Concern  . Not on file   Social History Narrative    Current Outpatient Prescriptions on File Prior to Visit  Medication Sig Dispense Refill  . amLODipine (NORVASC) 5 MG tablet TAKE 1 TABLET BY MOUTH EVERY DAY 30 tablet 0  . aspirin 81 MG tablet Take 81 mg by mouth daily.    Marland Kitchen KRILL OIL PO Take 1 capsule by mouth daily.    Marland Kitchen SYNTHROID 75 MCG tablet TAKE 1 TABLET BY MOUTH EVERY DAY 30 tablet 0  . calcium-vitamin D 250-100 MG-UNIT per tablet Take 1 tablet by mouth daily.     No current facility-administered medications on file prior to visit.  Allergies  Allergen Reactions  . Penicillins Rash    Review of Systems  Review of Systems  Constitutional: Negative for fever, chills and malaise/fatigue.  HENT: Negative for congestion, hearing loss and nosebleeds.   Eyes: Negative for discharge.  Respiratory: Negative for cough, sputum production, shortness of breath and wheezing.   Cardiovascular: Negative for chest pain, palpitations and leg swelling.  Gastrointestinal: Positive for heartburn. Negative for nausea, vomiting, abdominal pain, diarrhea, constipation and blood in stool.  Genitourinary: Negative for dysuria, urgency, frequency and hematuria.  Musculoskeletal: Positive for joint pain. Negative for myalgias, back pain and falls.       Right should discomfort intermittent, has been following with Dr Maureen Ralphs and PT with some improvement, pain worse with  rotation  Skin: Negative for rash.  Neurological: Negative for dizziness, tremors, sensory change, focal weakness, loss of consciousness, weakness and headaches.  Endo/Heme/Allergies: Negative for polydipsia. Does not bruise/bleed easily.  Psychiatric/Behavioral: Negative for depression and suicidal ideas. The patient is not nervous/anxious and does not have insomnia.     Objective  BP 124/82 mmHg  Pulse 80  Temp(Src) 97.9 F (36.6 C) (Oral)  Resp 16  Ht 5\' 2"  (1.575 m)  Wt 137 lb 6 oz (62.313 kg)  BMI 25.12 kg/m2  SpO2 98%  Physical Exam  Physical Exam  Constitutional: She is oriented to person, place, and time and well-developed, well-nourished, and in no distress. No distress.  HENT:  Head: Normocephalic and atraumatic.  Eyes: Conjunctivae are normal.  Neck: Neck supple. No thyromegaly present.  Cardiovascular: Normal rate, regular rhythm and normal heart sounds.   No murmur heard. Pulmonary/Chest: Effort normal and breath sounds normal. She has no wheezes.  Abdominal: Soft. Bowel sounds are normal. She exhibits no distension and no mass. There is no tenderness. There is no rebound and no guarding.  Musculoskeletal: She exhibits no edema or tenderness.  Lymphadenopathy:    She has no cervical adenopathy.  Neurological: She is alert and oriented to person, place, and time. She displays normal reflexes. No cranial nerve deficit.  Skin: Skin is warm and dry. No rash noted. She is not diaphoretic.  Psychiatric: Memory, affect and judgment normal.    Lab Results  Component Value Date   TSH 2.05 01/28/2015   Lab Results  Component Value Date   WBC 8.2 01/28/2015   HGB 13.6 01/28/2015   HCT 40.8 01/28/2015   MCV 88.1 01/28/2015   PLT 249.0 01/28/2015   Lab Results  Component Value Date   CREATININE 0.67 01/28/2015   BUN 13 01/28/2015   NA 141 01/28/2015   K 4.0 01/28/2015   CL 106 01/28/2015   CO2 27 01/28/2015   Lab Results  Component Value Date   ALT 25  01/28/2015   AST 22 01/28/2015   ALKPHOS 75 01/28/2015   BILITOT 0.6 01/28/2015   Lab Results  Component Value Date   CHOL 231* 01/28/2015   Lab Results  Component Value Date   HDL 47.30 01/28/2015   Lab Results  Component Value Date   LDLCALC 147* 01/28/2015   Lab Results  Component Value Date   TRIG 186.0* 01/28/2015   Lab Results  Component Value Date   CHOLHDL 5 01/28/2015     Assessment & Plan  Hypertension Well controlled, no changes to meds. Encouraged heart healthy diet such as the DASH diet and exercise as tolerated.    GERD (gastroesophageal reflux disease) Is taking Omeprazole and it helps some at 20 mg but still  struggles. Avoid offending foods, start probiotics. Do not eat large meals in late evening and consider raising head of bed. Try alternating Ranitidine and Omeprazole and may need referral if symptoms do not improve   Hyperlipidemia Encouraged heart healthy diet, increase exercise, avoid trans fats, consider a krill oil cap daily   Thyroid disease On Levothyroxine, continue to monitor   Vitamin D deficiency Noted to be low on labwork, started on Vitamin D 50000 IU weekly and recheck in 12 weeks.   Medicare annual wellness visit, subsequent Patient denies any difficulties at home. No trouble with ADLs, depression or falls. No recent changes to vision or hearing. Is UTD with immunizations. Is UTD with screening. Discussed Advanced Directives, patient agrees to bring [place] copies of documents if can. Encouraged heart healthy diet, exercise as tolerated and adequate sleep. MGM on 05/28/2014 Dexa scan in 2015 Immunizations UTD Colonoscopy in 2007, repeat needed in 2017 Pap in 2015, repeat in 3 years. Sees Dr Jenne Pane of dermatology She will check with her insurance to make sure the will cover Pneumovax and will return for shot if they will

## 2015-02-27 ENCOUNTER — Other Ambulatory Visit: Payer: Self-pay | Admitting: Family Medicine

## 2015-04-01 DIAGNOSIS — L821 Other seborrheic keratosis: Secondary | ICD-10-CM | POA: Diagnosis not present

## 2015-04-01 DIAGNOSIS — L819 Disorder of pigmentation, unspecified: Secondary | ICD-10-CM | POA: Diagnosis not present

## 2015-04-01 DIAGNOSIS — C44319 Basal cell carcinoma of skin of other parts of face: Secondary | ICD-10-CM | POA: Diagnosis not present

## 2015-04-01 DIAGNOSIS — D2262 Melanocytic nevi of left upper limb, including shoulder: Secondary | ICD-10-CM | POA: Diagnosis not present

## 2015-04-01 DIAGNOSIS — L570 Actinic keratosis: Secondary | ICD-10-CM | POA: Diagnosis not present

## 2015-04-01 DIAGNOSIS — D485 Neoplasm of uncertain behavior of skin: Secondary | ICD-10-CM | POA: Diagnosis not present

## 2015-04-01 DIAGNOSIS — D2239 Melanocytic nevi of other parts of face: Secondary | ICD-10-CM | POA: Diagnosis not present

## 2015-04-01 DIAGNOSIS — C4441 Basal cell carcinoma of skin of scalp and neck: Secondary | ICD-10-CM | POA: Diagnosis not present

## 2015-04-16 DIAGNOSIS — Z85828 Personal history of other malignant neoplasm of skin: Secondary | ICD-10-CM | POA: Diagnosis not present

## 2015-04-16 DIAGNOSIS — C44319 Basal cell carcinoma of skin of other parts of face: Secondary | ICD-10-CM | POA: Diagnosis not present

## 2015-04-21 ENCOUNTER — Other Ambulatory Visit (INDEPENDENT_AMBULATORY_CARE_PROVIDER_SITE_OTHER): Payer: Medicare Other

## 2015-04-21 ENCOUNTER — Other Ambulatory Visit: Payer: Self-pay | Admitting: Family Medicine

## 2015-04-21 DIAGNOSIS — E559 Vitamin D deficiency, unspecified: Secondary | ICD-10-CM

## 2015-04-21 LAB — VITAMIN D 25 HYDROXY (VIT D DEFICIENCY, FRACTURES): VITD: 37.8 ng/mL (ref 30.00–100.00)

## 2015-05-21 ENCOUNTER — Emergency Department (HOSPITAL_COMMUNITY)
Admission: EM | Admit: 2015-05-21 | Discharge: 2015-05-22 | Disposition: A | Discharge status: Home / Self Care | Payer: Medicare Other | Attending: Emergency Medicine | Admitting: Emergency Medicine

## 2015-05-21 ENCOUNTER — Encounter (HOSPITAL_COMMUNITY): Payer: Self-pay | Admitting: Emergency Medicine

## 2015-05-21 ENCOUNTER — Emergency Department (HOSPITAL_COMMUNITY): Payer: Medicare Other

## 2015-05-21 DIAGNOSIS — E079 Disorder of thyroid, unspecified: Secondary | ICD-10-CM | POA: Insufficient documentation

## 2015-05-21 DIAGNOSIS — Y929 Unspecified place or not applicable: Secondary | ICD-10-CM | POA: Insufficient documentation

## 2015-05-21 DIAGNOSIS — S83014A Lateral dislocation of right patella, initial encounter: Secondary | ICD-10-CM | POA: Diagnosis not present

## 2015-05-21 DIAGNOSIS — I10 Essential (primary) hypertension: Secondary | ICD-10-CM | POA: Diagnosis not present

## 2015-05-21 DIAGNOSIS — Z862 Personal history of diseases of the blood and blood-forming organs and certain disorders involving the immune mechanism: Secondary | ICD-10-CM | POA: Diagnosis not present

## 2015-05-21 DIAGNOSIS — Z859 Personal history of malignant neoplasm, unspecified: Secondary | ICD-10-CM | POA: Diagnosis not present

## 2015-05-21 DIAGNOSIS — S81009A Unspecified open wound, unspecified knee, initial encounter: Secondary | ICD-10-CM | POA: Diagnosis not present

## 2015-05-21 DIAGNOSIS — E559 Vitamin D deficiency, unspecified: Secondary | ICD-10-CM | POA: Insufficient documentation

## 2015-05-21 DIAGNOSIS — K219 Gastro-esophageal reflux disease without esophagitis: Secondary | ICD-10-CM | POA: Insufficient documentation

## 2015-05-21 DIAGNOSIS — Y9389 Activity, other specified: Secondary | ICD-10-CM | POA: Insufficient documentation

## 2015-05-21 DIAGNOSIS — Z7982 Long term (current) use of aspirin: Secondary | ICD-10-CM | POA: Diagnosis not present

## 2015-05-21 DIAGNOSIS — X58XXXA Exposure to other specified factors, initial encounter: Secondary | ICD-10-CM | POA: Diagnosis not present

## 2015-05-21 DIAGNOSIS — S83004A Unspecified dislocation of right patella, initial encounter: Secondary | ICD-10-CM

## 2015-05-21 DIAGNOSIS — Z880 Allergy status to penicillin: Secondary | ICD-10-CM | POA: Diagnosis not present

## 2015-05-21 DIAGNOSIS — Y998 Other external cause status: Secondary | ICD-10-CM | POA: Insufficient documentation

## 2015-05-21 DIAGNOSIS — Z79899 Other long term (current) drug therapy: Secondary | ICD-10-CM | POA: Diagnosis not present

## 2015-05-21 DIAGNOSIS — S83104A Unspecified dislocation of right knee, initial encounter: Secondary | ICD-10-CM | POA: Diagnosis present

## 2015-05-21 DIAGNOSIS — T148 Other injury of unspecified body region: Secondary | ICD-10-CM | POA: Diagnosis not present

## 2015-05-21 MED ORDER — HYDROCODONE-ACETAMINOPHEN 5-325 MG PO TABS: 2.0000 | ORAL_TABLET | ORAL | Status: DC | PRN

## 2015-05-21 MED ORDER — MIDAZOLAM HCL 2 MG/2ML IJ SOLN
2.0000 mg | Freq: Once | INTRAMUSCULAR | Status: AC
  Administered 2015-05-21: 2 mg via INTRAVENOUS
  Filled 2015-05-21: qty 2

## 2015-05-21 MED ORDER — FENTANYL CITRATE (PF) 100 MCG/2ML IJ SOLN
100.0000 ug | Freq: Once | INTRAMUSCULAR | Status: AC
  Administered 2015-05-21: 100 ug via INTRAVENOUS
  Filled 2015-05-21: qty 2

## 2015-05-21 MED ORDER — NAPROXEN 500 MG PO TABS: 500.0000 mg | ORAL_TABLET | Freq: Two times a day (BID) | ORAL | Status: DC

## 2015-05-21 NOTE — Discharge Instructions (Signed)
Please call your doctor for a followup appointment within 24-48 hours. When you talk to your doctor please let them know that you were seen in the emergency department and have them acquire all of your records so that they can discuss the findings with you and formulate a treatment plan to fully care for your new and ongoing problems. ° °

## 2015-05-21 NOTE — ED Provider Notes (Signed)
CSN: [id number]     Arrival date & time 05/21/15  2202 History   First MD Initiated Contact with Patient 05/21/15 2204     Chief Complaint  Patient presents with  . 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 She was unable to self reduce this, paramedics gave her 100 g of fentanyl, she is in significant amount of pain with any range of motion. No numbness or weakness, unable to ambulate  The history is provided by the patient.    Past Medical History  Diagnosis Date  . GERD (gastroesophageal reflux disease)   . Hypertension 65  . Cancer 2007    calf of right leg- melanoma  . Anemia     prior to hysterectomy  . Allergy   . Hyperlipidemia   . Thyroid disease 64  . Preventative health care 03/30/2012  . Knee pain 03/30/2012    L>R  . Cervical cancer screening 06/06/2012  . Vitamin D deficiency 06/06/2012  . Medicare annual wellness visit, subsequent 02/09/2015   Past Surgical History  Procedure Laterality Date  . Breast surgery  early 70's    fibroid tumors removed- benign  . Abdominal hysterectomy  1990's    total, for heavy bleeding and fibroids  . Cataract extraction Bilateral 12/09/12   Family History  Problem Relation Age of Onset  . Hypertension Mother   . Alzheimer's disease Mother   . Dementia Mother     alzheimer's  . Aortic aneurysm Father   . Hypertension Father   . COPD Father     smoker  . Heart disease Father     s/p bypass, aortic aneurysm, carotid artery disease  . Cancer Sister 53    breast- remission  . Diabetes Son 22    type 1  . Alzheimer's disease  Maternal Grandmother   . Cancer Maternal Grandfather     prostate  . Stroke Paternal Grandfather    History  Substance Use Topics  . Smoking status: Never Smoker   . Smokeless tobacco: Never Used  . Alcohol Use: No   OB History    No data available     Review of Systems  All other systems reviewed and are negative.     Allergies  Penicillins  Home Medications   Prior to Admission medications   Medication Sig Start Date End Date Taking? Authorizing Provider  amLODipine (NORVASC) 5 MG tablet TAKE 1 TABLET BY MOUTH EVERY DAY 01/22/15  Yes [name], MD  aspirin 81 MG tablet Take 81 mg by mouth daily.   Yes Historical Provider, MD  calcium-vitamin D 250-100 MG-UNIT per tablet Take 1 tablet by mouth daily.   Yes Historical Provider, MD  Cholecalciferol (VITAMIN D3) 5000 UNITS CAPS Take 5,000 Units by mouth daily.   Yes Historical Provider, MD  KRILL OIL PO Take 1 capsule by mouth daily.   Yes Historical Provider, MD  omeprazole (PRILOSEC) 20 MG capsule Take 1 capsule (20 mg total) by mouth daily. 01/28/15  Yes [name], MD  Red Yeast Rice Extract (RED YEAST RICE PO) Take 1 capsule by mouth daily.    Yes Historical Provider, MD  Wilmer Floor  75 MCG tablet TAKE 1 TABLET BY MOUTH EVERY DAY 01/22/15  Yes [name], MD  amLODipine (NORVASC) 5 MG tablet TAKE 1 TABLET BY MOUTH EVERY DAY Patient not taking: Reported on 05/21/2015 02/27/15   [name], MD  HYDROcodone-acetaminophen (NORCO/VICODIN) 5-325 MG per tablet Take 2 tablets by mouth every 4 (four) hours as needed. 05/21/15   Noemi Chapel, MD  naproxen (NAPROSYN) 500 MG tablet Take 1 tablet (500 mg total) by mouth 2 (two) times daily with a meal. 05/21/15   Noemi Chapel, MD  ranitidine (ZANTAC) 300 MG tablet Take 1 tablet (300 mg total) by mouth at bedtime. Patient not taking: Reported on 05/21/2015 01/28/15   [name], MD  SYNTHROID 75 MCG tablet TAKE 1 TABLET BY MOUTH EVERY DAY Patient not taking: Reported on  05/21/2015 02/27/15   [name], MD   BP 126/67 mmHg  Pulse 84  Temp(Src) 97.7 F (36.5 C) (Oral)  Resp 16  SpO2 94% Physical Exam  Constitutional: She appears well-developed and well-nourished. No distress.  HENT:  Head: Normocephalic and atraumatic.  Mouth/Throat: Oropharynx is clear and moist. No oropharyngeal exudate.  Eyes: Conjunctivae and EOM are normal. Pupils are equal, round, and reactive to light. Right eye exhibits no discharge. Left eye exhibits no discharge. No scleral icterus.  Neck: Normal range of motion. Neck supple. No JVD present. No thyromegaly present.  Cardiovascular: Normal rate, regular rhythm, normal heart sounds and intact distal pulses.  Exam reveals no gallop and no friction rub.   No murmur heard. Pulmonary/Chest: Effort normal and breath sounds normal. No respiratory distress. She has no wheezes. She has no rales.  Abdominal: Soft. Bowel sounds are normal. She exhibits no distension and no mass. There is no tenderness.  Musculoskeletal: Normal range of motion. She exhibits no edema or tenderness.  Lymphadenopathy:    She has no cervical adenopathy.  Neurological: She is alert. Coordination normal.  Skin: Skin is warm and dry. No rash noted. No erythema.  Psychiatric: She has a normal mood and affect. Her behavior is normal.  Nursing note and vitals reviewed.   ED Course  Procedures (including critical care time) Labs Review Labs Reviewed - No data to display  Imaging Review Dg Knee Complete 4 Views Right  05/21/2015   CLINICAL DATA:  Patient was standing in church and knee gave out with patellar dislocation. This is a recurrent problem. Right knee pain.  EXAM: RIGHT KNEE - COMPLETE 4+ VIEW  COMPARISON:  None.  FINDINGS: Lateral dislocation of the patella. No acute fracture is demonstrated. No focal bone lesion or bone destruction. No significant effusion.  IMPRESSION: Lateral dislocation of the patella.   Electronically Signed   By: Lucienne Capers M.D.   On: 05/21/2015 23:11    Procedure:  Dislocation Reduction of right knee  Consent:  Description of the procedures as well as Risks of procedure as well as the alternatives and risks of each were explained to the (patient/caregiver).  who has verbally expressed their understanding.   verbal consent given by  the patient.  Imaging reviewed, anatomic site of dislocation identified and marked, Patient identity confirmed by arm band and with hospital identification number as well as verbally with patient.  Time Out performed.  Patient was prepped and draped in the usual sterile fashion. Sedation used No..  Sedation type: Moderate.  Type of Sedation used: none.  Reduction method: lateral pressure and knee hyperextension.  Vitals were monitored through the procedure.  Complications: none.  Pt tolerated the procedure without complaints and returned to baseline without difficulty.  Post reduction images were obtained confirming successful reduction of dislocation.  Immobilization applied, Neurovascular status reevaluated and is good with normal pulses, sensation and good capillary refill of the affected extremity.   MDM   Final diagnoses:  Patellar dislocation, right, initial encounter    Initially the patient had significant difficulty with reduction of her patella, after fentanyl and a small dose of Versed the patient was significantly relaxed and was able to easily reduce the patellar dislocation. X-ray report shows no signs of fracture, just a lateral patellar dislocation. After clinical reduction and complete improvement of the patient's pain there is no indication for repeat imaging as she has a good clinical reduction. She was placed in a knee immobilizer by orthopedic technician, I checked pulses and neurovascular sensation after it was placed and they're normal. The patient will be given pain medication for home, encouraged to follow-up with orthopedics.  Meds given in  ED:  Medications  midazolam (VERSED) injection 2 mg (2 mg Intravenous Given 05/21/15 2324)  fentaNYL (SUBLIMAZE) injection 100 mcg (100 mcg Intravenous Given 05/21/15 2323)    New Prescriptions   HYDROCODONE-ACETAMINOPHEN (NORCO/VICODIN) 5-325 MG PER TABLET    Take 2 tablets by mouth every 4 (four) hours as needed.   NAPROXEN (NAPROSYN) 500 MG TABLET    Take 1 tablet (500 mg total) by mouth 2 (two) times daily with a meal.      Noemi Chapel, MD 05/21/15 2333

## 2015-05-21 NOTE — ED Notes (Signed)
Patient placed on SPO2 monitor.

## 2015-05-21 NOTE — ED Notes (Signed)
Patient placed on [facility] for O2 support. MD at bedside, right knee reduction. Ortho tech at bedside.

## 2015-05-21 NOTE — ED Notes (Signed)
Per ems pt from church, pt was getting in car when right knee got twisted and dislocated. Hx of dislocation . Pt received 100 mcg Fentanyl IV by Ems.

## 2015-05-31 ENCOUNTER — Other Ambulatory Visit: Payer: Self-pay | Admitting: Family Medicine

## 2015-06-16 DIAGNOSIS — Z803 Family history of malignant neoplasm of breast: Secondary | ICD-10-CM | POA: Diagnosis not present

## 2015-06-16 DIAGNOSIS — Z1231 Encounter for screening mammogram for malignant neoplasm of breast: Secondary | ICD-10-CM | POA: Diagnosis not present

## 2015-06-16 LAB — HM MAMMOGRAPHY

## 2015-06-19 ENCOUNTER — Encounter: Payer: Self-pay | Admitting: Family Medicine

## 2015-08-01 ENCOUNTER — Ambulatory Visit (INDEPENDENT_AMBULATORY_CARE_PROVIDER_SITE_OTHER): Payer: Medicare Other | Admitting: Family Medicine

## 2015-08-01 ENCOUNTER — Encounter: Payer: Self-pay | Admitting: Family Medicine

## 2015-08-01 VITALS — BP 132/82 | HR 72 | Temp 98.4°F | Ht 62.0 in | Wt 144.2 lb

## 2015-08-01 DIAGNOSIS — E559 Vitamin D deficiency, unspecified: Secondary | ICD-10-CM | POA: Diagnosis not present

## 2015-08-01 DIAGNOSIS — D649 Anemia, unspecified: Secondary | ICD-10-CM | POA: Diagnosis not present

## 2015-08-01 DIAGNOSIS — I10 Essential (primary) hypertension: Secondary | ICD-10-CM

## 2015-08-01 DIAGNOSIS — M5030 Other cervical disc degeneration, unspecified cervical region: Secondary | ICD-10-CM | POA: Insufficient documentation

## 2015-08-01 DIAGNOSIS — Z23 Encounter for immunization: Secondary | ICD-10-CM | POA: Diagnosis not present

## 2015-08-01 DIAGNOSIS — E079 Disorder of thyroid, unspecified: Secondary | ICD-10-CM

## 2015-08-01 DIAGNOSIS — M8580 Other specified disorders of bone density and structure, unspecified site: Secondary | ICD-10-CM | POA: Diagnosis not present

## 2015-08-01 DIAGNOSIS — E785 Hyperlipidemia, unspecified: Secondary | ICD-10-CM | POA: Diagnosis not present

## 2015-08-01 DIAGNOSIS — C4491 Basal cell carcinoma of skin, unspecified: Secondary | ICD-10-CM

## 2015-08-01 DIAGNOSIS — S83104D Unspecified dislocation of right knee, subsequent encounter: Secondary | ICD-10-CM

## 2015-08-01 DIAGNOSIS — M25561 Pain in right knee: Secondary | ICD-10-CM

## 2015-08-01 HISTORY — DX: Other cervical disc degeneration, unspecified cervical region: M50.30

## 2015-08-01 HISTORY — DX: Basal cell carcinoma of skin, unspecified: C44.91

## 2015-08-01 LAB — CBC
HEMATOCRIT: 40.4 % (ref 36.0–46.0)
HEMOGLOBIN: 13.3 g/dL (ref 12.0–15.0)
MCHC: 33 g/dL (ref 30.0–36.0)
MCV: 88.6 fl (ref 78.0–100.0)
Platelets: 206 10*3/uL (ref 150.0–400.0)
RBC: 4.56 Mil/uL (ref 3.87–5.11)
RDW: 13.6 % (ref 11.5–15.5)
WBC: 8 10*3/uL (ref 4.0–10.5)

## 2015-08-01 LAB — LIPID PANEL
Cholesterol: 213 mg/dL — ABNORMAL HIGH (ref 0–200)
HDL: 42.1 mg/dL (ref >39.00)
NonHDL: 171.15
Total CHOL/HDL Ratio: 5
Triglycerides: 207 mg/dL — ABNORMAL HIGH (ref 0.0–149.0)
VLDL: 41.4 mg/dL — ABNORMAL HIGH (ref 0.0–40.0)

## 2015-08-01 LAB — TSH: TSH: 2.5 u[IU]/mL (ref 0.35–4.50)

## 2015-08-01 LAB — LDL CHOLESTEROL, DIRECT: Direct LDL: 152 mg/dL

## 2015-08-01 LAB — VITAMIN D 25 HYDROXY (VIT D DEFICIENCY, FRACTURES): VITD: 78.53 ng/mL (ref 30.00–100.00)

## 2015-08-01 NOTE — Progress Notes (Signed)
Pre visit review using our clinic review tool, if applicable. No additional management support is needed unless otherwise documented below in the visit note. 

## 2015-08-01 NOTE — Assessment & Plan Note (Signed)
On forehead, Dr Sarajane Jews removed via Mohs procecure. Sees Dr Amy Martinique

## 2015-08-01 NOTE — Assessment & Plan Note (Signed)
Check vitamin d level today, calcium tid and increase exercise

## 2015-08-01 NOTE — Progress Notes (Signed)
Patient ID: Tammy Grimes, female   DOB: [birth year], 69 y.o.   MRN: [id number]   Subjective:    Patient ID: Tammy Grimes, female    DOB: [birth year], 69 y.o.   MRN: [id number]  Chief Complaint  Patient presents with  . Follow-up    6 month    HPI Patient is in today for follow-up. Feels well. Denies polyuria or polydipsia. Has been eating well. No palpitations or acute concerns. Denies CP/SOB/HA/congestion/fevers/GI or GU c/o. Taking meds as prescribed  Past Medical History  Diagnosis Date  . GERD (gastroesophageal reflux disease)   . Hypertension 65  . Cancer 2007    calf of right leg- melanoma  . Anemia     prior to hysterectomy  . Allergy   . Hyperlipidemia   . Thyroid disease 64  . Preventative health care 03/30/2012  . Knee pain 03/30/2012    L>R  . Cervical cancer screening 06/06/2012  . Vitamin D deficiency 06/06/2012  . Medicare annual wellness visit, subsequent 02/09/2015  . BCC (basal cell carcinoma) 08/01/2015  . DDD (degenerative disc disease), cervical 08/01/2015    Past Surgical History  Procedure Laterality Date  . Breast surgery  early 70's    fibroid tumors removed- benign  . Abdominal hysterectomy  1990's    total, for heavy bleeding and fibroids  . Cataract extraction Bilateral 12/09/12    Family History  Problem Relation Age of Onset  . Hypertension Mother   . Alzheimer's disease Mother   . Dementia Mother     alzheimer's  . Aortic aneurysm Father   . Hypertension Father   . COPD Father     smoker  . Heart disease Father     s/p bypass, aortic aneurysm, carotid artery disease  . Cancer Sister 70    breast- remission  . Diabetes Son 22    type 1  . Alzheimer's disease Maternal Grandmother   . Cancer Maternal Grandfather     prostate  . Stroke Paternal Grandfather     Social History   Social History  . Marital Status: Married    Spouse Name: N/A  . Number of Children: N/A  . Years of Education: N/A   Occupational History  . Not on  file.   Social History Main Topics  . Smoking status: Never Smoker   . Smokeless tobacco: Never Used  . Alcohol Use: No  . Drug Use: No  . Sexual Activity: Not Currently   Other Topics Concern  . Not on file   Social History Narrative    Outpatient Prescriptions Prior to Visit  Medication Sig Dispense Refill  . amLODipine (NORVASC) 5 MG tablet TAKE 1 TABLET BY MOUTH EVERY DAY 30 tablet 0  . aspirin 81 MG tablet Take 81 mg by mouth daily.    . calcium-vitamin D 250-100 MG-UNIT per tablet Take 1 tablet by mouth daily.    . Cholecalciferol (VITAMIN D3) 5000 UNITS CAPS Take 5,000 Units by mouth daily.    Marland Kitchen HYDROcodone-acetaminophen (NORCO/VICODIN) 5-325 MG per tablet Take 2 tablets by mouth every 4 (four) hours as needed. 10 tablet 0  . KRILL OIL PO Take 1 capsule by mouth daily.    . naproxen (NAPROSYN) 500 MG tablet Take 1 tablet (500 mg total) by mouth 2 (two) times daily with a meal. 30 tablet 0  . Red Yeast Rice Extract (RED YEAST RICE PO) Take 1 capsule by mouth daily.     Marland Kitchen SYNTHROID 75 MCG tablet TAKE  1 TABLET BY MOUTH EVERY DAY 30 tablet 0  . omeprazole (PRILOSEC) 20 MG capsule Take 1 capsule (20 mg total) by mouth daily. 90 capsule 1  . ranitidine (ZANTAC) 300 MG tablet Take 1 tablet (300 mg total) by mouth at bedtime. (Patient not taking: Reported on 05/21/2015) 90 tablet 1  . amLODipine (NORVASC) 5 MG tablet TAKE 1 TABLET BY MOUTH EVERY DAY 90 tablet 0  . SYNTHROID 75 MCG tablet TAKE 1 TABLET BY MOUTH EVERY DAY 90 tablet 0   No facility-administered medications prior to visit.    Allergies  Allergen Reactions  . Penicillins Rash    Review of Systems  Constitutional: Negative for fever and malaise/fatigue.  HENT: Negative for congestion.   Eyes: Negative for discharge.  Respiratory: Negative for shortness of breath.   Cardiovascular: Negative for chest pain, palpitations and leg swelling.  Gastrointestinal: Negative for nausea and abdominal pain.  Genitourinary:  Negative for dysuria.  Musculoskeletal: Negative for falls.  Skin: Negative for rash.  Neurological: Negative for loss of consciousness and headaches.  Endo/Heme/Allergies: Negative for environmental allergies.  Psychiatric/Behavioral: Negative for depression. The patient is not nervous/anxious.        Objective:    Physical Exam  BP 132/82 mmHg  Pulse 72  Temp(Src) 98.4 F (36.9 C)  Ht 5\' 2"  (1.575 m)  Wt 144 lb 3.2 oz (65.409 kg)  BMI 26.37 kg/m2  SpO2 96% Wt Readings from Last 3 Encounters:  08/01/15 144 lb 3.2 oz (65.409 kg)  02/03/15 137 lb (62.143 kg)  01/28/15 137 lb 6 oz (62.313 kg)     Lab Results  Component Value Date   WBC 8.0 08/01/2015   HGB 13.3 08/01/2015   HCT 40.4 08/01/2015   PLT 206.0 08/01/2015   GLUCOSE 110* 08/08/2015   CHOL 213* 08/01/2015   TRIG 207.0* 08/01/2015   HDL 42.10 08/01/2015   LDLDIRECT 152.0 08/01/2015   LDLCALC 147* 01/28/2015   ALT 38* 08/08/2015   AST 25 08/08/2015   NA 142 08/08/2015   K 4.2 08/08/2015   CL 107 08/08/2015   CREATININE 0.63 08/08/2015   BUN 17 08/08/2015   CO2 25 08/08/2015   TSH 2.50 08/01/2015    Lab Results  Component Value Date   TSH 2.50 08/01/2015   Lab Results  Component Value Date   WBC 8.0 08/01/2015   HGB 13.3 08/01/2015   HCT 40.4 08/01/2015   MCV 88.6 08/01/2015   PLT 206.0 08/01/2015   Lab Results  Component Value Date   NA 142 08/08/2015   K 4.2 08/08/2015   CO2 25 08/08/2015   GLUCOSE 110* 08/08/2015   BUN 17 08/08/2015   CREATININE 0.63 08/08/2015   BILITOT 0.5 08/08/2015   ALKPHOS 61 08/08/2015   AST 25 08/08/2015   ALT 38* 08/08/2015   PROT 7.1 08/08/2015   ALBUMIN 4.4 08/08/2015   CALCIUM 9.5 08/08/2015   GFR 99.97 08/08/2015   Lab Results  Component Value Date   CHOL 213* 08/01/2015   Lab Results  Component Value Date   HDL 42.10 08/01/2015   Lab Results  Component Value Date   LDLCALC 147* 01/28/2015   Lab Results  Component Value Date   TRIG  207.0* 08/01/2015   Lab Results  Component Value Date   CHOLHDL 5 08/01/2015   No results found for: HGBA1C     Assessment & Plan:   Problem List Items Addressed This Visit    Vitamin D deficiency   Relevant Orders  Vit D  25 hydroxy (rtn osteoporosis monitoring) (Completed)   Thyroid disease    On Levothyroxine, continue to monitor      Relevant Orders   TSH (Completed)   Osteopenia    Check vitamin d level today, calcium tid and increase exercise      Relevant Orders   CBC (Completed)   Vit D  25 hydroxy (rtn osteoporosis monitoring) (Completed)   Knee pain    Right knee injury this summer, she reports many years of this happening intermittently but this episode was very painful and difficult. She continues to struggles, in ER on 7/13/2016they had to sedate her to have it replaced. She continues to have trouble with ADLs and back pain as a relief, has set up with chiropractic, [place], starting next week. Will reestablish with Dr Maureen Ralphs at [facility] ortho.       Relevant Orders   Ambulatory referral to Orthopedic Surgery   Hypertension    Well controlled, no changes to meds. Encouraged heart healthy diet such as the DASH diet and exercise as tolerated.       Relevant Orders   CBC (Completed)   TSH (Completed)   Hyperlipidemia    Encouraged heart healthy diet, increase exercise, avoid trans fats, consider a krill oil cap daily      Relevant Orders   Lipid panel (Completed)   Anemia   Relevant Orders   CBC (Completed)    Other Visit Diagnoses    Need for prophylactic vaccination against Streptococcus pneumoniae (pneumococcus)    -  Primary    Relevant Orders    Pneumococcal polysaccharide vaccine 23-valent greater than or equal to 2yo subcutaneous/IM (Completed)    Dislocation closed, knee, right, subsequent encounter        Relevant Orders    Ambulatory referral to Orthopedic Surgery    Encounter for immunization           I am having Ms. Figge  maintain her aspirin, KRILL OIL PO, calcium-vitamin D, amLODipine, SYNTHROID, Red Yeast Rice Extract (RED YEAST RICE PO), ranitidine, Vitamin D3, HYDROcodone-acetaminophen, and naproxen.  No orders of the defined types were placed in this encounter.     Penni Homans, MD

## 2015-08-01 NOTE — Patient Instructions (Signed)

## 2015-08-01 NOTE — Assessment & Plan Note (Signed)
Pain persistent but tolerable most days Tylenol qhs prn works well is establishing care with chiropractor this week.

## 2015-08-01 NOTE — Assessment & Plan Note (Signed)
Right knee injury this summer, she reports many years of this happening intermittently but this episode was very painful and difficult. She continues to struggles, in ER on 7/13/2016they had to sedate her to have it replaced. She continues to have trouble with ADLs and back pain as a relief, has set up with chiropractic, [place], starting next week. Will reestablish with Dr Maureen Ralphs at [facility] ortho.

## 2015-08-04 DIAGNOSIS — M9903 Segmental and somatic dysfunction of lumbar region: Secondary | ICD-10-CM | POA: Diagnosis not present

## 2015-08-04 DIAGNOSIS — M5432 Sciatica, left side: Secondary | ICD-10-CM | POA: Diagnosis not present

## 2015-08-05 ENCOUNTER — Telehealth: Payer: Self-pay | Admitting: *Deleted

## 2015-08-05 ENCOUNTER — Other Ambulatory Visit: Payer: Self-pay | Admitting: Family Medicine

## 2015-08-05 DIAGNOSIS — M9903 Segmental and somatic dysfunction of lumbar region: Secondary | ICD-10-CM | POA: Diagnosis not present

## 2015-08-05 DIAGNOSIS — M5432 Sciatica, left side: Secondary | ICD-10-CM | POA: Diagnosis not present

## 2015-08-05 DIAGNOSIS — E785 Hyperlipidemia, unspecified: Secondary | ICD-10-CM

## 2015-08-05 DIAGNOSIS — K2100 Gastro-esophageal reflux disease with esophagitis, without bleeding: Secondary | ICD-10-CM

## 2015-08-05 DIAGNOSIS — I10 Essential (primary) hypertension: Secondary | ICD-10-CM

## 2015-08-05 MED ORDER — OMEPRAZOLE 20 MG PO CPDR: 20.0000 mg | DELAYED_RELEASE_CAPSULE | Freq: Every day | ORAL | Status: DC

## 2015-08-05 NOTE — Telephone Encounter (Signed)
Called and [facility] @ 2:40pm @ [phone]) asking the pt to RTC regarding lab test.//AB/CMA

## 2015-08-05 NOTE — Telephone Encounter (Signed)
-----   Message from [name], MD sent at 08/04/2015  8:34 PM EDT ----- Regarding: RE: Repeat blood test Thanks for letting me know ----- Message -----    From: Harl Bowie, CMA    Sent: 08/04/2015   2:07 PM      To: [name], MD Subject: Repeat blood test                              Received call from the lab.  Something happen with the tube of blood and they were unable to run the CMP on this pt.  I will give the pt a call and ask her to come back to have the lab repeated.  Sorry!  Angie

## 2015-08-05 NOTE — Telephone Encounter (Signed)
Spoke with the pt and informed her that we will need to repeat CMP.  Pt agreed and will schedule appt.  Future lab ordered and sent.//AB/CMA

## 2015-08-06 DIAGNOSIS — M9903 Segmental and somatic dysfunction of lumbar region: Secondary | ICD-10-CM | POA: Diagnosis not present

## 2015-08-06 DIAGNOSIS — M5432 Sciatica, left side: Secondary | ICD-10-CM | POA: Diagnosis not present

## 2015-08-07 DIAGNOSIS — M9903 Segmental and somatic dysfunction of lumbar region: Secondary | ICD-10-CM | POA: Diagnosis not present

## 2015-08-07 DIAGNOSIS — M5432 Sciatica, left side: Secondary | ICD-10-CM | POA: Diagnosis not present

## 2015-08-08 ENCOUNTER — Other Ambulatory Visit (INDEPENDENT_AMBULATORY_CARE_PROVIDER_SITE_OTHER): Payer: Medicare Other

## 2015-08-08 DIAGNOSIS — I10 Essential (primary) hypertension: Secondary | ICD-10-CM

## 2015-08-08 DIAGNOSIS — E785 Hyperlipidemia, unspecified: Secondary | ICD-10-CM | POA: Diagnosis not present

## 2015-08-08 LAB — COMPREHENSIVE METABOLIC PANEL
ALT: 38 U/L — ABNORMAL HIGH (ref 0–35)
AST: 25 U/L (ref 0–37)
Albumin: 4.4 g/dL (ref 3.5–5.2)
Alkaline Phosphatase: 61 U/L (ref 39–117)
BUN: 17 mg/dL (ref 6–23)
CALCIUM: 9.5 mg/dL (ref 8.4–10.5)
CO2: 25 meq/L (ref 19–32)
CREATININE: 0.63 mg/dL (ref 0.40–1.20)
Chloride: 107 mEq/L (ref 96–112)
GFR: 99.97 mL/min (ref >60.00)
GLUCOSE: 110 mg/dL — AB (ref 70–99)
Potassium: 4.2 mEq/L (ref 3.5–5.1)
Sodium: 142 mEq/L (ref 135–145)
Total Bilirubin: 0.5 mg/dL (ref 0.2–1.2)
Total Protein: 7.1 g/dL (ref 6.0–8.3)

## 2015-08-08 NOTE — Assessment & Plan Note (Signed)
Encouraged heart healthy diet, increase exercise, avoid trans fats, consider a krill oil cap daily 

## 2015-08-08 NOTE — Assessment & Plan Note (Signed)
On Levothyroxine, continue to monitor 

## 2015-08-08 NOTE — Assessment & Plan Note (Signed)
Well controlled, no changes to meds. Encouraged heart healthy diet such as the DASH diet and exercise as tolerated.  °

## 2015-08-11 DIAGNOSIS — M9903 Segmental and somatic dysfunction of lumbar region: Secondary | ICD-10-CM | POA: Diagnosis not present

## 2015-08-11 DIAGNOSIS — M5432 Sciatica, left side: Secondary | ICD-10-CM | POA: Diagnosis not present

## 2015-08-12 DIAGNOSIS — M9903 Segmental and somatic dysfunction of lumbar region: Secondary | ICD-10-CM | POA: Diagnosis not present

## 2015-08-12 DIAGNOSIS — M5432 Sciatica, left side: Secondary | ICD-10-CM | POA: Diagnosis not present

## 2015-08-14 DIAGNOSIS — M9903 Segmental and somatic dysfunction of lumbar region: Secondary | ICD-10-CM | POA: Diagnosis not present

## 2015-08-14 DIAGNOSIS — M5432 Sciatica, left side: Secondary | ICD-10-CM | POA: Diagnosis not present

## 2015-08-18 DIAGNOSIS — M5432 Sciatica, left side: Secondary | ICD-10-CM | POA: Diagnosis not present

## 2015-08-18 DIAGNOSIS — M9903 Segmental and somatic dysfunction of lumbar region: Secondary | ICD-10-CM | POA: Diagnosis not present

## 2015-08-20 DIAGNOSIS — M9903 Segmental and somatic dysfunction of lumbar region: Secondary | ICD-10-CM | POA: Diagnosis not present

## 2015-08-20 DIAGNOSIS — M5432 Sciatica, left side: Secondary | ICD-10-CM | POA: Diagnosis not present

## 2015-08-25 DIAGNOSIS — M5432 Sciatica, left side: Secondary | ICD-10-CM | POA: Diagnosis not present

## 2015-08-25 DIAGNOSIS — M9903 Segmental and somatic dysfunction of lumbar region: Secondary | ICD-10-CM | POA: Diagnosis not present

## 2015-09-01 DIAGNOSIS — M5432 Sciatica, left side: Secondary | ICD-10-CM | POA: Diagnosis not present

## 2015-09-01 DIAGNOSIS — M9903 Segmental and somatic dysfunction of lumbar region: Secondary | ICD-10-CM | POA: Diagnosis not present

## 2015-09-03 DIAGNOSIS — M5432 Sciatica, left side: Secondary | ICD-10-CM | POA: Diagnosis not present

## 2015-09-03 DIAGNOSIS — M9903 Segmental and somatic dysfunction of lumbar region: Secondary | ICD-10-CM | POA: Diagnosis not present

## 2015-09-04 ENCOUNTER — Other Ambulatory Visit: Payer: Self-pay | Admitting: Family Medicine

## 2015-09-04 MED ORDER — SYNTHROID 75 MCG PO TABS: 75.0000 ug | ORAL_TABLET | Freq: Every day | ORAL | Status: DC

## 2015-09-04 MED ORDER — AMLODIPINE BESYLATE 5 MG PO TABS: 5.0000 mg | ORAL_TABLET | Freq: Every day | ORAL | Status: DC

## 2015-09-15 DIAGNOSIS — M5432 Sciatica, left side: Secondary | ICD-10-CM | POA: Diagnosis not present

## 2015-09-15 DIAGNOSIS — M9903 Segmental and somatic dysfunction of lumbar region: Secondary | ICD-10-CM | POA: Diagnosis not present

## 2015-09-22 DIAGNOSIS — M5432 Sciatica, left side: Secondary | ICD-10-CM | POA: Diagnosis not present

## 2015-09-22 DIAGNOSIS — M9903 Segmental and somatic dysfunction of lumbar region: Secondary | ICD-10-CM | POA: Diagnosis not present

## 2015-10-06 DIAGNOSIS — M9903 Segmental and somatic dysfunction of lumbar region: Secondary | ICD-10-CM | POA: Diagnosis not present

## 2015-10-06 DIAGNOSIS — M5432 Sciatica, left side: Secondary | ICD-10-CM | POA: Diagnosis not present

## 2015-10-27 DIAGNOSIS — M9903 Segmental and somatic dysfunction of lumbar region: Secondary | ICD-10-CM | POA: Diagnosis not present

## 2015-10-27 DIAGNOSIS — M5432 Sciatica, left side: Secondary | ICD-10-CM | POA: Diagnosis not present

## 2015-11-24 DIAGNOSIS — M5432 Sciatica, left side: Secondary | ICD-10-CM | POA: Diagnosis not present

## 2015-11-24 DIAGNOSIS — M9903 Segmental and somatic dysfunction of lumbar region: Secondary | ICD-10-CM | POA: Diagnosis not present

## 2015-12-11 DIAGNOSIS — Z961 Presence of intraocular lens: Secondary | ICD-10-CM | POA: Diagnosis not present

## 2015-12-22 DIAGNOSIS — M9903 Segmental and somatic dysfunction of lumbar region: Secondary | ICD-10-CM | POA: Diagnosis not present

## 2015-12-22 DIAGNOSIS — M5432 Sciatica, left side: Secondary | ICD-10-CM | POA: Diagnosis not present

## 2016-01-05 DIAGNOSIS — M5432 Sciatica, left side: Secondary | ICD-10-CM | POA: Diagnosis not present

## 2016-01-05 DIAGNOSIS — M9903 Segmental and somatic dysfunction of lumbar region: Secondary | ICD-10-CM | POA: Diagnosis not present

## 2016-01-26 DIAGNOSIS — M9903 Segmental and somatic dysfunction of lumbar region: Secondary | ICD-10-CM | POA: Diagnosis not present

## 2016-01-26 DIAGNOSIS — M5432 Sciatica, left side: Secondary | ICD-10-CM | POA: Diagnosis not present

## 2016-01-29 ENCOUNTER — Telehealth: Payer: Self-pay

## 2016-01-30 ENCOUNTER — Encounter: Payer: Self-pay | Admitting: Family Medicine

## 2016-01-30 ENCOUNTER — Ambulatory Visit (INDEPENDENT_AMBULATORY_CARE_PROVIDER_SITE_OTHER): Payer: Medicare Other | Admitting: Family Medicine

## 2016-01-30 VITALS — BP 130/82 | HR 89 | Temp 98.1°F | Ht 62.0 in | Wt 144.5 lb

## 2016-01-30 DIAGNOSIS — I10 Essential (primary) hypertension: Secondary | ICD-10-CM | POA: Diagnosis not present

## 2016-01-30 DIAGNOSIS — E559 Vitamin D deficiency, unspecified: Secondary | ICD-10-CM

## 2016-01-30 DIAGNOSIS — Z0000 Encounter for general adult medical examination without abnormal findings: Secondary | ICD-10-CM

## 2016-01-30 DIAGNOSIS — D649 Anemia, unspecified: Secondary | ICD-10-CM

## 2016-01-30 DIAGNOSIS — E079 Disorder of thyroid, unspecified: Secondary | ICD-10-CM

## 2016-01-30 DIAGNOSIS — M8580 Other specified disorders of bone density and structure, unspecified site: Secondary | ICD-10-CM | POA: Diagnosis not present

## 2016-01-30 DIAGNOSIS — Z23 Encounter for immunization: Secondary | ICD-10-CM | POA: Diagnosis not present

## 2016-01-30 DIAGNOSIS — K2100 Gastro-esophageal reflux disease with esophagitis, without bleeding: Secondary | ICD-10-CM

## 2016-01-30 DIAGNOSIS — E785 Hyperlipidemia, unspecified: Secondary | ICD-10-CM | POA: Diagnosis not present

## 2016-01-30 DIAGNOSIS — Z780 Asymptomatic menopausal state: Secondary | ICD-10-CM

## 2016-01-30 LAB — LIPID PANEL
CHOL/HDL RATIO: 5
Cholesterol: 205 mg/dL — ABNORMAL HIGH (ref 0–200)
HDL: 43.2 mg/dL (ref >39.00)
LDL CALC: 135 mg/dL — AB (ref 0–99)
NonHDL: 161.69
Triglycerides: 135 mg/dL (ref 0.0–149.0)
VLDL: 27 mg/dL (ref 0.0–40.0)

## 2016-01-30 LAB — CBC
HEMATOCRIT: 38.7 % (ref 36.0–46.0)
Hemoglobin: 13 g/dL (ref 12.0–15.0)
MCHC: 33.6 g/dL (ref 30.0–36.0)
MCV: 87.5 fl (ref 78.0–100.0)
Platelets: 235 10*3/uL (ref 150.0–400.0)
RBC: 4.43 Mil/uL (ref 3.87–5.11)
RDW: 13.9 % (ref 11.5–15.5)
WBC: 7.3 10*3/uL (ref 4.0–10.5)

## 2016-01-30 LAB — COMPREHENSIVE METABOLIC PANEL
ALT: 33 U/L (ref 0–35)
AST: 24 U/L (ref 0–37)
Albumin: 4.6 g/dL (ref 3.5–5.2)
Alkaline Phosphatase: 65 U/L (ref 39–117)
BUN: 10 mg/dL (ref 6–23)
CHLORIDE: 107 meq/L (ref 96–112)
CO2: 25 mEq/L (ref 19–32)
Calcium: 9.4 mg/dL (ref 8.4–10.5)
Creatinine, Ser: 0.61 mg/dL (ref 0.40–1.20)
GFR: 103.61 mL/min (ref >60.00)
GLUCOSE: 108 mg/dL — AB (ref 70–99)
POTASSIUM: 4 meq/L (ref 3.5–5.1)
SODIUM: 141 meq/L (ref 135–145)
Total Bilirubin: 0.5 mg/dL (ref 0.2–1.2)
Total Protein: 7.4 g/dL (ref 6.0–8.3)

## 2016-01-30 LAB — VITAMIN D 25 HYDROXY (VIT D DEFICIENCY, FRACTURES): VITD: 66.76 ng/mL (ref 30.00–100.00)

## 2016-01-30 LAB — TSH: TSH: 2.37 u[IU]/mL (ref 0.35–4.50)

## 2016-01-30 NOTE — Assessment & Plan Note (Signed)
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 No episodes of dislocation since July before that it had been 15 years. Is at gym several days a week now and that is helping

## 2016-01-30 NOTE — Progress Notes (Signed)
Subjective:    Patient ID: Tammy Grimes, female    DOB: 04-15-1946, 70 y.o.   MRN: [id number]  Chief Complaint  Patient presents with  . Annual Exam    HPI Patient is in today for Annual Physical Exam.  Patient has been doing well seeing a chiropractor, and exercising.  Patient wears braces on knees as needed when take long walks or has long car rides.  Patient reports having bilateral cataracts in 2015. Is working to stay active and eat a heart healthy diet. Doing well with ADLs at home. Denies CP/palp/SOB/HA/congestion/fevers/GI or GU c/o. Taking meds as prescribed.    Past Medical History  Diagnosis Date  . GERD (gastroesophageal reflux disease)   . Hypertension 65  . Cancer ([place]) 2007    calf of right leg- melanoma  . Anemia     prior to hysterectomy  . Allergy   . Hyperlipidemia   . Thyroid disease 64  . Preventative health care 03/30/2012  . Knee pain 03/30/2012    L>R  . Cervical cancer screening 06/06/2012  . Vitamin D deficiency 06/06/2012  . Medicare annual wellness visit, subsequent 02/09/2015  . BCC (basal cell carcinoma) 08/01/2015  . DDD (degenerative disc disease), cervical 08/01/2015    Past Surgical History  Procedure Laterality Date  . Breast surgery  early 70's    fibroid tumors removed- benign  . Abdominal hysterectomy  1990's    total, for heavy bleeding and fibroids  . Cataract extraction Bilateral 12/09/12    Family History  Problem Relation Age of Onset  . Hypertension Mother   . Alzheimer's disease Mother   . Dementia Mother     alzheimer's  . Aortic aneurysm Father   . Hypertension Father   . COPD Father     smoker  . Heart disease Father     s/p bypass, aortic aneurysm, carotid artery disease  . Cancer Sister 30    breast- remission  . Diabetes Son 22    type 1  . Alzheimer's disease Maternal Grandmother   . Cancer Maternal Grandfather     prostate  . Stroke Paternal Grandfather     Social History   Social History  . Marital  Status: Married    Spouse Name: N/A  . Number of Children: N/A  . Years of Education: N/A   Occupational History  . Not on file.   Social History Main Topics  . Smoking status: Never Smoker   . Smokeless tobacco: Never Used  . Alcohol Use: No  . Drug Use: No  . Sexual Activity: Not Currently   Other Topics Concern  . Not on file   Social History Narrative    Outpatient Prescriptions Prior to Visit  Medication Sig Dispense Refill  . amLODipine (NORVASC) 5 MG tablet Take 1 tablet (5 mg total) by mouth daily. 90 tablet 1  . aspirin 81 MG tablet Take 81 mg by mouth daily.    . calcium-vitamin D 250-100 MG-UNIT per tablet Take 1 tablet by mouth daily.    . Cholecalciferol (VITAMIN D3) 5000 UNITS CAPS Take 5,000 Units by mouth daily.    Marland Kitchen KRILL OIL PO Take 1 capsule by mouth daily.    . naproxen (NAPROSYN) 500 MG tablet Take 1 tablet (500 mg total) by mouth 2 (two) times daily with a meal. 30 tablet 0  . omeprazole (PRILOSEC) 20 MG capsule Take 1 capsule (20 mg total) by mouth daily. 90 capsule 1  . ranitidine (ZANTAC) 300  MG tablet Take 1 tablet (300 mg total) by mouth at bedtime. 90 tablet 1  . Red Yeast Rice Extract (RED YEAST RICE PO) Take 1 capsule by mouth daily.     Marland Kitchen SYNTHROID 75 MCG tablet Take 1 tablet (75 mcg total) by mouth daily. 90 tablet 1  . HYDROcodone-acetaminophen (NORCO/VICODIN) 5-325 MG per tablet Take 2 tablets by mouth every 4 (four) hours as needed. (Patient not taking: Reported on 01/29/2016) 10 tablet 0   No facility-administered medications prior to visit.    Allergies  Allergen Reactions  . Penicillins Rash    Review of Systems  Constitutional: Negative for fever and malaise/fatigue.  HENT: Negative for congestion.   Eyes: Negative for blurred vision.  Respiratory: Negative for shortness of breath.   Cardiovascular: Negative for chest pain, palpitations and leg swelling.  Gastrointestinal: Negative for nausea, abdominal pain and blood in stool.    Genitourinary: Negative for dysuria and frequency.  Musculoskeletal: Positive for back pain and joint pain. Negative for falls.  Skin: Negative for rash.  Neurological: Negative for dizziness, loss of consciousness and headaches.  Endo/Heme/Allergies: Negative for environmental allergies.  Psychiatric/Behavioral: Negative for depression. The patient is not nervous/anxious.        Objective:    Physical Exam  Constitutional: She is oriented to person, place, and time. She appears well-developed and well-nourished. No distress.  HENT:  Head: Normocephalic and atraumatic.  Eyes: Conjunctivae are normal.  Neck: Neck supple. No thyromegaly present.  Cardiovascular: Normal rate, regular rhythm and normal heart sounds.   No murmur heard. Pulmonary/Chest: Effort normal and breath sounds normal. No respiratory distress.  Abdominal: Soft. Bowel sounds are normal. She exhibits no distension and no mass. There is no tenderness.  Musculoskeletal: She exhibits no edema.  Lymphadenopathy:    She has no cervical adenopathy.  Neurological: She is alert and oriented to person, place, and time.  Skin: Skin is warm and dry.  Psychiatric: She has a normal mood and affect. Her behavior is normal.    BP 130/82 mmHg  Pulse 89  Temp(Src) 98.1 F (36.7 C) (Oral)  Ht 5\' 2"  (1.575 m)  Wt 144 lb 8 oz (65.545 kg)  BMI 26.42 kg/m2  SpO2 96% Wt Readings from Last 3 Encounters:  01/30/16 144 lb 8 oz (65.545 kg)  08/01/15 144 lb 3.2 oz (65.409 kg)  02/03/15 137 lb (62.143 kg)     Lab Results  Component Value Date   WBC 8.0 08/01/2015   HGB 13.3 08/01/2015   HCT 40.4 08/01/2015   PLT 206.0 08/01/2015   GLUCOSE 110* 08/08/2015   CHOL 213* 08/01/2015   TRIG 207.0* 08/01/2015   HDL 42.10 08/01/2015   LDLDIRECT 152.0 08/01/2015   LDLCALC 147* 01/28/2015   ALT 38* 08/08/2015   AST 25 08/08/2015   NA 142 08/08/2015   K 4.2 08/08/2015   CL 107 08/08/2015   CREATININE 0.63 08/08/2015   BUN 17  08/08/2015   CO2 25 08/08/2015   TSH 2.50 08/01/2015    Lab Results  Component Value Date   TSH 2.50 08/01/2015   Lab Results  Component Value Date   WBC 8.0 08/01/2015   HGB 13.3 08/01/2015   HCT 40.4 08/01/2015   MCV 88.6 08/01/2015   PLT 206.0 08/01/2015   Lab Results  Component Value Date   NA 142 08/08/2015   K 4.2 08/08/2015   CO2 25 08/08/2015   GLUCOSE 110* 08/08/2015   BUN 17 08/08/2015   CREATININE 0.63  08/08/2015   BILITOT 0.5 08/08/2015   ALKPHOS 61 08/08/2015   AST 25 08/08/2015   ALT 38* 08/08/2015   PROT 7.1 08/08/2015   ALBUMIN 4.4 08/08/2015   CALCIUM 9.5 08/08/2015   GFR 99.97 08/08/2015   Lab Results  Component Value Date   CHOL 213* 08/01/2015   Lab Results  Component Value Date   HDL 42.10 08/01/2015   Lab Results  Component Value Date   LDLCALC 147* 01/28/2015   Lab Results  Component Value Date   TRIG 207.0* 08/01/2015   Lab Results  Component Value Date   CHOLHDL 5 08/01/2015   No results found for: HGBA1C     Assessment & Plan:   Problem List Items Addressed This Visit    None      I have discontinued Ms. Knoles's HYDROcodone-acetaminophen. I am also having her maintain her aspirin, KRILL OIL PO, calcium-vitamin D, Red Yeast Rice Extract (RED YEAST RICE PO), ranitidine, Vitamin D3, naproxen, omeprazole, amLODipine, and SYNTHROID.  No orders of the defined types were placed in this encounter.     Jan Fireman, [place]

## 2016-01-30 NOTE — Assessment & Plan Note (Addendum)
Patient encouraged to maintain heart healthy diet, regular exercise, adequate sleep. Consider daily probiotics. Take medications as prescribed. Given and reviewed copy of ACP documents from [place] Secretary of State and encouraged to complete and return 

## 2016-01-30 NOTE — Assessment & Plan Note (Signed)
Encouraged heart healthy diet, increase exercise, avoid trans fats, consider a krill oil cap daily 

## 2016-01-30 NOTE — Patient Instructions (Addendum)
JOBST Compression Holes, Light weight knee highs.  Preventive Care for Adults, Female A healthy lifestyle and preventive care can promote health and wellness. Preventive health guidelines for women include the following key practices.  A routine yearly physical is a good way to check with your health care provider about your health and preventive screening. It is a chance to share any concerns and updates on your health and to receive a thorough exam.  Visit your dentist for a routine exam and preventive care every 6 months. Brush your teeth twice a day and floss once a day. Good oral hygiene prevents tooth decay and gum disease.  The frequency of eye exams is based on your age, health, family medical history, use of contact lenses, and other factors. Follow your health care provider's recommendations for frequency of eye exams.  Eat a healthy diet. Foods like vegetables, fruits, whole grains, low-fat dairy products, and lean protein foods contain the nutrients you need without too many calories. Decrease your intake of foods high in solid fats, added sugars, and salt. Eat the right amount of calories for you.Get information about a proper diet from your health care provider, if necessary.  Regular physical exercise is one of the most important things you can do for your health. Most adults should get at least 150 minutes of moderate-intensity exercise (any activity that increases your heart rate and causes you to sweat) each week. In addition, most adults need muscle-strengthening exercises on 2 or more days a week.  Maintain a healthy weight. The body mass index (BMI) is a screening tool to identify possible weight problems. It provides an estimate of body fat based on height and weight. Your health care provider can find your BMI and can help you achieve or maintain a healthy weight.For adults 20 years and older:  A BMI below 18.5 is considered underweight.  A BMI of 18.5 to 24.9 is  normal.  A BMI of 25 to 29.9 is considered overweight.  A BMI of 30 and above is considered obese.  Maintain normal blood lipids and cholesterol levels by exercising and minimizing your intake of saturated fat. Eat a balanced diet with plenty of fruit and vegetables. Blood tests for lipids and cholesterol should begin at age 83 and be repeated every 5 years. If your lipid or cholesterol levels are high, you are over 50, or you are at high risk for heart disease, you may need your cholesterol levels checked more frequently.Ongoing high lipid and cholesterol levels should be treated with medicines if diet and exercise are not working.  If you smoke, find out from your health care provider how to quit. If you do not use tobacco, do not start.  Lung cancer screening is recommended for adults aged 55-80 years who are at high risk for developing lung cancer because of a history of smoking. A yearly low-dose CT scan of the lungs is recommended for people who have at least a 30-pack-year history of smoking and are a current smoker or have quit within the past 15 years. A pack year of smoking is smoking an average of 1 pack of cigarettes a day for 1 year (for example: 1 pack a day for 30 years or 2 packs a day for 15 years). Yearly screening should continue until the smoker has stopped smoking for at least 15 years. Yearly screening should be stopped for people who develop a health problem that would prevent them from having lung cancer treatment.  If you are  pregnant, do not drink alcohol. If you are breastfeeding, be very cautious about drinking alcohol. If you are not pregnant and choose to drink alcohol, do not have more than 1 drink per day. One drink is considered to be 12 ounces (355 mL) of beer, 5 ounces (148 mL) of wine, or 1.5 ounces (44 mL) of liquor.  Avoid use of street drugs. Do not share needles with anyone. Ask for help if you need support or instructions about stopping the use of  drugs.  High blood pressure causes heart disease and increases the risk of stroke. Your blood pressure should be checked at least every 1 to 2 years. Ongoing high blood pressure should be treated with medicines if weight loss and exercise do not work.  If you are 88-84 years old, ask your health care provider if you should take aspirin to prevent strokes.  Diabetes screening is done by taking a blood sample to check your blood glucose level after you have not eaten for a certain period of time (fasting). If you are not overweight and you do not have risk factors for diabetes, you should be screened once every 3 years starting at age 34. If you are overweight or obese and you are 62-54 years of age, you should be screened for diabetes every year as part of your cardiovascular risk assessment.  Breast cancer screening is essential preventive care for women. You should practice "breast self-awareness." This means understanding the normal appearance and feel of your breasts and may include breast self-examination. Any changes detected, no matter how small, should be reported to a health care provider. Women in their 44s and 30s should have a clinical breast exam (CBE) by a health care provider as part of a regular health exam every 1 to 3 years. After age 63, women should have a CBE every year. Starting at age 73, women should consider having a mammogram (breast X-ray test) every year. Women who have a family history of breast cancer should talk to their health care provider about genetic screening. Women at a high risk of breast cancer should talk to their health care providers about having an MRI and a mammogram every year.  Breast cancer gene (BRCA)-related cancer risk assessment is recommended for women who have family members with BRCA-related cancers. BRCA-related cancers include breast, ovarian, tubal, and peritoneal cancers. Having family members with these cancers may be associated with an increased  risk for harmful changes (mutations) in the breast cancer genes BRCA1 and BRCA2. Results of the assessment will determine the need for genetic counseling and BRCA1 and BRCA2 testing.  Your health care provider may recommend that you be screened regularly for cancer of the pelvic organs (ovaries, uterus, and vagina). This screening involves a pelvic examination, including checking for microscopic changes to the surface of your cervix (Pap test). You may be encouraged to have this screening done every 3 years, beginning at age 74.  For women ages 70-65, health care providers may recommend pelvic exams and Pap testing every 3 years, or they may recommend the Pap and pelvic exam, combined with testing for human papilloma virus (HPV), every 5 years. Some types of HPV increase your risk of cervical cancer. Testing for HPV may also be done on women of any age with unclear Pap test results.  Other health care providers may not recommend any screening for nonpregnant women who are considered low risk for pelvic cancer and who do not have symptoms. Ask your health care provider  if a screening pelvic exam is right for you.  If you have had past treatment for cervical cancer or a condition that could lead to cancer, you need Pap tests and screening for cancer for at least 20 years after your treatment. If Pap tests have been discontinued, your risk factors (such as having a new sexual partner) need to be reassessed to determine if screening should resume. Some women have medical problems that increase the chance of getting cervical cancer. In these cases, your health care provider may recommend more frequent screening and Pap tests.  Colorectal cancer can be detected and often prevented. Most routine colorectal cancer screening begins at the age of 37 years and continues through age 14 years. However, your health care provider may recommend screening at an earlier age if you have risk factors for colon cancer. On a  yearly basis, your health care provider may provide home test kits to check for hidden blood in the stool. Use of a small camera at the end of a tube, to directly examine the colon (sigmoidoscopy or colonoscopy), can detect the earliest forms of colorectal cancer. Talk to your health care provider about this at age 23, when routine screening begins. Direct exam of the colon should be repeated every 5-10 years through age 66 years, unless early forms of precancerous polyps or small growths are found.  People who are at an increased risk for hepatitis B should be screened for this virus. You are considered at high risk for hepatitis B if:  You were born in a country where hepatitis B occurs often. Talk with your health care provider about which countries are considered high risk.  Your parents were born in a high-risk country and you have not received a shot to protect against hepatitis B (hepatitis B vaccine).  You have HIV or AIDS.  You use needles to inject street drugs.  You live with, or have sex with, someone who has hepatitis B.  You get hemodialysis treatment.  You take certain medicines for conditions like cancer, organ transplantation, and autoimmune conditions.  Hepatitis C blood testing is recommended for all people born from [birth year] through 1965 and any individual with known risks for hepatitis C.  Practice safe sex. Use condoms and avoid high-risk sexual practices to reduce the spread of sexually transmitted infections (STIs). STIs include gonorrhea, chlamydia, syphilis, trichomonas, herpes, HPV, and human immunodeficiency virus (HIV). Herpes, HIV, and HPV are viral illnesses that have no cure. They can result in disability, cancer, and death.  You should be screened for sexually transmitted illnesses (STIs) including gonorrhea and chlamydia if:  You are sexually active and are younger than 24 years.  You are older than 24 years and your health care provider tells you that you are  at risk for this type of infection.  Your sexual activity has changed since you were last screened and you are at an increased risk for chlamydia or gonorrhea. Ask your health care provider if you are at risk.  If you are at risk of being infected with HIV, it is recommended that you take a prescription medicine daily to prevent HIV infection. This is called preexposure prophylaxis (PrEP). You are considered at risk if:  You are sexually active and do not regularly use condoms or know the HIV status of your partner(s).  You take drugs by injection.  You are sexually active with a partner who has HIV.  Talk with your health care provider about whether you are at  high risk of being infected with HIV. If you choose to begin PrEP, you should first be tested for HIV. You should then be tested every 3 months for as long as you are taking PrEP.  Osteoporosis is a disease in which the bones lose minerals and strength with aging. This can result in serious bone fractures or breaks. The risk of osteoporosis can be identified using a bone density scan. Women ages 56 years and over and women at risk for fractures or osteoporosis should discuss screening with their health care providers. Ask your health care provider whether you should take a calcium supplement or vitamin D to reduce the rate of osteoporosis.  Menopause can be associated with physical symptoms and risks. Hormone replacement therapy is available to decrease symptoms and risks. You should talk to your health care provider about whether hormone replacement therapy is right for you.  Use sunscreen. Apply sunscreen liberally and repeatedly throughout the day. You should seek shade when your shadow is shorter than you. Protect yourself by wearing long sleeves, pants, a wide-brimmed hat, and sunglasses year round, whenever you are outdoors.  Once a month, do a whole body skin exam, using a mirror to look at the skin on your back. Tell your health  care provider of new moles, moles that have irregular borders, moles that are larger than a pencil eraser, or moles that have changed in shape or color.  Stay current with required vaccines (immunizations).  Influenza vaccine. All adults should be immunized every year.  Tetanus, diphtheria, and acellular pertussis (Td, Tdap) vaccine. Pregnant women should receive 1 dose of Tdap vaccine during each pregnancy. The dose should be obtained regardless of the length of time since the last dose. Immunization is preferred during the 27th-36th week of gestation. An adult who has not previously received Tdap or who does not know her vaccine status should receive 1 dose of Tdap. This initial dose should be followed by tetanus and diphtheria toxoids (Td) booster doses every 10 years. Adults with an unknown or incomplete history of completing a 3-dose immunization series with Td-containing vaccines should begin or complete a primary immunization series including a Tdap dose. Adults should receive a Td booster every 10 years.  Varicella vaccine. An adult without evidence of immunity to varicella should receive 2 doses or a second dose if she has previously received 1 dose. Pregnant females who do not have evidence of immunity should receive the first dose after pregnancy. This first dose should be obtained before leaving the health care facility. The second dose should be obtained 4-8 weeks after the first dose.  Human papillomavirus (HPV) vaccine. Females aged 13-26 years who have not received the vaccine previously should obtain the 3-dose series. The vaccine is not recommended for use in pregnant females. However, pregnancy testing is not needed before receiving a dose. If a female is found to be pregnant after receiving a dose, no treatment is needed. In that case, the remaining doses should be delayed until after the pregnancy. Immunization is recommended for any person with an immunocompromised condition through  the age of 42 years if she did not get any or all doses earlier. During the 3-dose series, the second dose should be obtained 4-8 weeks after the first dose. The third dose should be obtained 24 weeks after the first dose and 16 weeks after the second dose.  Zoster vaccine. One dose is recommended for adults aged 105 years or older unless certain conditions are present.  Measles, mumps, and rubella (MMR) vaccine. Adults born before [birth year] generally are considered immune to measles and mumps. Adults born in [birth year] or later should have 1 or more doses of MMR vaccine unless there is a contraindication to the vaccine or there is laboratory evidence of immunity to each of the three diseases. A routine second dose of MMR vaccine should be obtained at least 28 days after the first dose for students attending postsecondary schools, health care workers, or international travelers. People who received inactivated measles vaccine or an unknown type of measles vaccine during 1963-1967 should receive 2 doses of MMR vaccine. People who received inactivated mumps vaccine or an unknown type of mumps vaccine before 1979 and are at high risk for mumps infection should consider immunization with 2 doses of MMR vaccine. For females of childbearing age, rubella immunity should be determined. If there is no evidence of immunity, females who are not pregnant should be vaccinated. If there is no evidence of immunity, females who are pregnant should delay immunization until after pregnancy. Unvaccinated health care workers born before [birth year] who lack laboratory evidence of measles, mumps, or rubella immunity or laboratory confirmation of disease should consider measles and mumps immunization with 2 doses of MMR vaccine or rubella immunization with 1 dose of MMR vaccine.  Pneumococcal 13-valent conjugate (PCV13) vaccine. When indicated, a person who is uncertain of his immunization history and has no record of immunization should receive the  PCV13 vaccine. All adults 53 years of age and older should receive this vaccine. An adult aged 37 years or older who has certain medical conditions and has not been previously immunized should receive 1 dose of PCV13 vaccine. This PCV13 should be followed with a dose of pneumococcal polysaccharide (PPSV23) vaccine. Adults who are at high risk for pneumococcal disease should obtain the PPSV23 vaccine at least 8 weeks after the dose of PCV13 vaccine. Adults older than 68 years of age who have normal immune system function should obtain the PPSV23 vaccine dose at least 1 year after the dose of PCV13 vaccine.  Pneumococcal polysaccharide (PPSV23) vaccine. When PCV13 is also indicated, PCV13 should be obtained first. All adults aged 54 years and older should be immunized. An adult younger than age 65 years who has certain medical conditions should be immunized. Any person who resides in a nursing home or long-term care facility should be immunized. An adult smoker should be immunized. People with an immunocompromised condition and certain other conditions should receive both PCV13 and PPSV23 vaccines. People with human immunodeficiency virus (HIV) infection should be immunized as soon as possible after diagnosis. Immunization during chemotherapy or radiation therapy should be avoided. Routine use of PPSV23 vaccine is not recommended for American Indians, [place] Natives, or people younger than 65 years unless there are medical conditions that require PPSV23 vaccine. When indicated, people who have unknown immunization and have no record of immunization should receive PPSV23 vaccine. One-time revaccination 5 years after the first dose of PPSV23 is recommended for people aged 19-64 years who have chronic kidney failure, nephrotic syndrome, asplenia, or immunocompromised conditions. People who received 1-2 doses of PPSV23 before age 40 years should receive another dose of PPSV23 vaccine at age 64 years or later if at least  5 years have passed since the previous dose. Doses of PPSV23 are not needed for people immunized with PPSV23 at or after age 102 years.  Meningococcal vaccine. Adults with asplenia or persistent complement component deficiencies should receive 2 doses of quadrivalent meningococcal  conjugate (MenACWY-D) vaccine. The doses should be obtained at least 2 months apart. Microbiologists working with certain meningococcal bacteria, [employer] recruits, people at risk during an outbreak, and people who travel to or live in countries with a high rate of meningitis should be immunized. A first-year college student up through age 65 years who is living in a residence hall should receive a dose if she did not receive a dose on or after her 16th birthday. Adults who have certain high-risk conditions should receive one or more doses of vaccine.  Hepatitis A vaccine. Adults who wish to be protected from this disease, have certain high-risk conditions, work with hepatitis A-infected animals, work in hepatitis A research labs, or travel to or work in countries with a high rate of hepatitis A should be immunized. Adults who were previously unvaccinated and who anticipate close contact with an international adoptee during the first 60 days after arrival in the [place] from a country with a high rate of hepatitis A should be immunized.  Hepatitis B vaccine. Adults who wish to be protected from this disease, have certain high-risk conditions, may be exposed to blood or other infectious body fluids, are household contacts or sex partners of hepatitis B positive people, are clients or workers in certain care facilities, or travel to or work in countries with a high rate of hepatitis B should be immunized.  Haemophilus influenzae type b (Hib) vaccine. A previously unvaccinated person with asplenia or sickle cell disease or having a scheduled splenectomy should receive 1 dose of Hib vaccine. Regardless of previous immunization, a  recipient of a hematopoietic stem cell transplant should receive a 3-dose series 6-12 months after her successful transplant. Hib vaccine is not recommended for adults with HIV infection. Preventive Services / Frequency Ages 85 to 67 years  Blood pressure check.** / Every 3-5 years.  Lipid and cholesterol check.** / Every 5 years beginning at age 77.  Clinical breast exam.** / Every 3 years for women in their 38s and 44s.  BRCA-related cancer risk assessment.** / For women who have family members with a BRCA-related cancer (breast, ovarian, tubal, or peritoneal cancers).  Pap test.** / Every 2 years from ages 60 through 5. Every 3 years starting at age 37 through age 53 or 71 with a history of 3 consecutive normal Pap tests.  HPV screening.** / Every 3 years from ages 66 through ages 77 to 38 with a history of 3 consecutive normal Pap tests.  Hepatitis C blood test.** / For any individual with known risks for hepatitis C.  Skin self-exam. / Monthly.  Influenza vaccine. / Every year.  Tetanus, diphtheria, and acellular pertussis (Tdap, Td) vaccine.** / Consult your health care provider. Pregnant women should receive 1 dose of Tdap vaccine during each pregnancy. 1 dose of Td every 10 years.  Varicella vaccine.** / Consult your health care provider. Pregnant females who do not have evidence of immunity should receive the first dose after pregnancy.  HPV vaccine. / 3 doses over 6 months, if 75 and younger. The vaccine is not recommended for use in pregnant females. However, pregnancy testing is not needed before receiving a dose.  Measles, mumps, rubella (MMR) vaccine.** / You need at least 1 dose of MMR if you were born in 1957 or later. You may also need a 2nd dose. For females of childbearing age, rubella immunity should be determined. If there is no evidence of immunity, females who are not pregnant should be vaccinated. If there  is no evidence of immunity, females who are pregnant  should delay immunization until after pregnancy.  Pneumococcal 13-valent conjugate (PCV13) vaccine.** / Consult your health care provider.  Pneumococcal polysaccharide (PPSV23) vaccine.** / 1 to 2 doses if you smoke cigarettes or if you have certain conditions.  Meningococcal vaccine.** / 1 dose if you are age 42 to 34 years and a Market researcher living in a residence hall, or have one of several medical conditions, you need to get vaccinated against meningococcal disease. You may also need additional booster doses.  Hepatitis A vaccine.** / Consult your health care provider.  Hepatitis B vaccine.** / Consult your health care provider.  Haemophilus influenzae type b (Hib) vaccine.** / Consult your health care provider. Ages 59 to 89 years  Blood pressure check.** / Every year.  Lipid and cholesterol check.** / Every 5 years beginning at age 45 years.  Lung cancer screening. / Every year if you are aged 4-80 years and have a 30-pack-year history of smoking and currently smoke or have quit within the past 15 years. Yearly screening is stopped once you have quit smoking for at least 15 years or develop a health problem that would prevent you from having lung cancer treatment.  Clinical breast exam.** / Every year after age 25 years.  BRCA-related cancer risk assessment.** / For women who have family members with a BRCA-related cancer (breast, ovarian, tubal, or peritoneal cancers).  Mammogram.** / Every year beginning at age 58 years and continuing for as long as you are in good health. Consult with your health care provider.  Pap test.** / Every 3 years starting at age 51 years through age 33 or 56 years with a history of 3 consecutive normal Pap tests.  HPV screening.** / Every 3 years from ages 36 years through ages 34 to 70 years with a history of 3 consecutive normal Pap tests.  Fecal occult blood test (FOBT) of stool. / Every year beginning at age 64 years and  continuing until age 57 years. You may not need to do this test if you get a colonoscopy every 10 years.  Flexible sigmoidoscopy or colonoscopy.** / Every 5 years for a flexible sigmoidoscopy or every 10 years for a colonoscopy beginning at age 21 years and continuing until age 31 years.  Hepatitis C blood test.** / For all people born from [birth year] through 1965 and any individual with known risks for hepatitis C.  Skin self-exam. / Monthly.  Influenza vaccine. / Every year.  Tetanus, diphtheria, and acellular pertussis (Tdap/Td) vaccine.** / Consult your health care provider. Pregnant women should receive 1 dose of Tdap vaccine during each pregnancy. 1 dose of Td every 10 years.  Varicella vaccine.** / Consult your health care provider. Pregnant females who do not have evidence of immunity should receive the first dose after pregnancy.  Zoster vaccine.** / 1 dose for adults aged 40 years or older.  Measles, mumps, rubella (MMR) vaccine.** / You need at least 1 dose of MMR if you were born in 1957 or later. You may also need a second dose. For females of childbearing age, rubella immunity should be determined. If there is no evidence of immunity, females who are not pregnant should be vaccinated. If there is no evidence of immunity, females who are pregnant should delay immunization until after pregnancy.  Pneumococcal 13-valent conjugate (PCV13) vaccine.** / Consult your health care provider.  Pneumococcal polysaccharide (PPSV23) vaccine.** / 1 to 2 doses if you smoke cigarettes or if  you have certain conditions.  Meningococcal vaccine.** / Consult your health care provider.  Hepatitis A vaccine.** / Consult your health care provider.  Hepatitis B vaccine.** / Consult your health care provider.  Haemophilus influenzae type b (Hib) vaccine.** / Consult your health care provider. Ages 27 years and over  Blood pressure check.** / Every year.  Lipid and cholesterol check.** / Every 5 years  beginning at age 53 years.  Lung cancer screening. / Every year if you are aged 7-80 years and have a 30-pack-year history of smoking and currently smoke or have quit within the past 15 years. Yearly screening is stopped once you have quit smoking for at least 15 years or develop a health problem that would prevent you from having lung cancer treatment.  Clinical breast exam.** / Every year after age 97 years.  BRCA-related cancer risk assessment.** / For women who have family members with a BRCA-related cancer (breast, ovarian, tubal, or peritoneal cancers).  Mammogram.** / Every year beginning at age 47 years and continuing for as long as you are in good health. Consult with your health care provider.  Pap test.** / Every 3 years starting at age 81 years through age 34 or 84 years with 3 consecutive normal Pap tests. Testing can be stopped between 65 and 70 years with 3 consecutive normal Pap tests and no abnormal Pap or HPV tests in the past 10 years.  HPV screening.** / Every 3 years from ages 51 years through ages 50 or 58 years with a history of 3 consecutive normal Pap tests. Testing can be stopped between 65 and 70 years with 3 consecutive normal Pap tests and no abnormal Pap or HPV tests in the past 10 years.  Fecal occult blood test (FOBT) of stool. / Every year beginning at age 53 years and continuing until age 1 years. You may not need to do this test if you get a colonoscopy every 10 years.  Flexible sigmoidoscopy or colonoscopy.** / Every 5 years for a flexible sigmoidoscopy or every 10 years for a colonoscopy beginning at age 57 years and continuing until age 34 years.  Hepatitis C blood test.** / For all people born from [birth year] through 1963 and any individual with known risks for hepatitis C.  Osteoporosis screening.** / A one-time screening for women ages 2 years and over and women at risk for fractures or osteoporosis.  Skin self-exam. / Monthly.  Influenza vaccine. /  Every year.  Tetanus, diphtheria, and acellular pertussis (Tdap/Td) vaccine.** / 1 dose of Td every 10 years.  Varicella vaccine.** / Consult your health care provider.  Zoster vaccine.** / 1 dose for adults aged 1 years or older.  Pneumococcal 13-valent conjugate (PCV13) vaccine.** / Consult your health care provider.  Pneumococcal polysaccharide (PPSV23) vaccine.** / 1 dose for all adults aged 26 years and older.  Meningococcal vaccine.** / Consult your health care provider.  Hepatitis A vaccine.** / Consult your health care provider.  Hepatitis B vaccine.** / Consult your health care provider.  Haemophilus influenzae type b (Hib) vaccine.** / Consult your health care provider. ** Family history and personal history of risk and conditions may change your health care provider's recommendations.   This information is not intended to replace advice given to you by your health care provider. Make sure you discuss any questions you have with your health care provider.   Document Released: 12/21/2001 Document Revised: 11/15/2014 Document Reviewed: 03/22/2011 Elsevier Interactive Patient Education [employer].

## 2016-01-30 NOTE — Assessment & Plan Note (Signed)
Well controlled, no changes to meds. Encouraged heart healthy diet such as the DASH diet and exercise as tolerated.  °

## 2016-01-30 NOTE — Progress Notes (Signed)
Pre visit review using our clinic review tool, if applicable. No additional management support is needed unless otherwise documented below in the visit note. 

## 2016-02-01 NOTE — Assessment & Plan Note (Signed)
Take daily supplements 

## 2016-02-01 NOTE — Assessment & Plan Note (Signed)
Encouraged to get adequate exercise, calcium and vitamin d intake 

## 2016-02-01 NOTE — Assessment & Plan Note (Addendum)
On Levothyroxine, continue to monitor 

## 2016-02-01 NOTE — Assessment & Plan Note (Signed)
Avoid offending foods, take probiotics. Do not eat large meals in late evening and consider raising head of bed. Omeprazole prn

## 2016-02-04 DIAGNOSIS — M5432 Sciatica, left side: Secondary | ICD-10-CM | POA: Diagnosis not present

## 2016-02-04 DIAGNOSIS — M9903 Segmental and somatic dysfunction of lumbar region: Secondary | ICD-10-CM | POA: Diagnosis not present

## 2016-02-05 DIAGNOSIS — M8589 Other specified disorders of bone density and structure, multiple sites: Secondary | ICD-10-CM | POA: Diagnosis not present

## 2016-02-09 DIAGNOSIS — M9903 Segmental and somatic dysfunction of lumbar region: Secondary | ICD-10-CM | POA: Diagnosis not present

## 2016-02-09 DIAGNOSIS — M5432 Sciatica, left side: Secondary | ICD-10-CM | POA: Diagnosis not present

## 2016-02-17 ENCOUNTER — Other Ambulatory Visit: Payer: Self-pay | Admitting: Family Medicine

## 2016-02-23 ENCOUNTER — Encounter: Payer: Self-pay | Admitting: Family Medicine

## 2016-02-23 DIAGNOSIS — M9903 Segmental and somatic dysfunction of lumbar region: Secondary | ICD-10-CM | POA: Diagnosis not present

## 2016-02-23 DIAGNOSIS — M5432 Sciatica, left side: Secondary | ICD-10-CM | POA: Diagnosis not present

## 2016-02-24 NOTE — Telephone Encounter (Signed)
Bone Density report has been faxed to our office.  PCP reviewed. Instructions to patient per PCP to continue Calcium 1200 to 1500 mg daily in supplements/diet and also vitamin D 2000 IU's daily.  The patient did verbally understand and agreed to all instructions.

## 2016-03-08 DIAGNOSIS — M9903 Segmental and somatic dysfunction of lumbar region: Secondary | ICD-10-CM | POA: Diagnosis not present

## 2016-03-08 DIAGNOSIS — M5432 Sciatica, left side: Secondary | ICD-10-CM | POA: Diagnosis not present

## 2016-03-09 ENCOUNTER — Other Ambulatory Visit: Payer: Self-pay | Admitting: Family Medicine

## 2016-03-18 DIAGNOSIS — M5432 Sciatica, left side: Secondary | ICD-10-CM | POA: Diagnosis not present

## 2016-03-18 DIAGNOSIS — M9903 Segmental and somatic dysfunction of lumbar region: Secondary | ICD-10-CM | POA: Diagnosis not present

## 2016-03-25 ENCOUNTER — Encounter: Payer: Self-pay | Admitting: Family Medicine

## 2016-04-06 ENCOUNTER — Ambulatory Visit (HOSPITAL_BASED_OUTPATIENT_CLINIC_OR_DEPARTMENT_OTHER)
Admission: RE | Admit: 2016-04-06 | Discharge: 2016-04-06 | Disposition: A | Discharge status: Home / Self Care | Payer: Medicare Other | Source: Ambulatory Visit | Attending: Physician Assistant | Admitting: Physician Assistant

## 2016-04-06 ENCOUNTER — Encounter: Payer: Self-pay | Admitting: Physician Assistant

## 2016-04-06 ENCOUNTER — Ambulatory Visit (INDEPENDENT_AMBULATORY_CARE_PROVIDER_SITE_OTHER): Payer: Medicare Other | Admitting: Physician Assistant

## 2016-04-06 VITALS — BP 128/82 | HR 78 | Temp 97.8°F | Resp 16 | Ht 62.0 in | Wt 139.5 lb

## 2016-04-06 DIAGNOSIS — R0989 Other specified symptoms and signs involving the circulatory and respiratory systems: Secondary | ICD-10-CM | POA: Diagnosis not present

## 2016-04-06 DIAGNOSIS — J019 Acute sinusitis, unspecified: Secondary | ICD-10-CM | POA: Diagnosis not present

## 2016-04-06 DIAGNOSIS — M5432 Sciatica, left side: Secondary | ICD-10-CM | POA: Diagnosis not present

## 2016-04-06 DIAGNOSIS — R05 Cough: Secondary | ICD-10-CM | POA: Diagnosis not present

## 2016-04-06 DIAGNOSIS — M9903 Segmental and somatic dysfunction of lumbar region: Secondary | ICD-10-CM | POA: Diagnosis not present

## 2016-04-06 DIAGNOSIS — B9689 Other specified bacterial agents as the cause of diseases classified elsewhere: Secondary | ICD-10-CM

## 2016-04-06 MED ORDER — LEVOFLOXACIN 500 MG PO TABS: 500.0000 mg | ORAL_TABLET | Freq: Every day | ORAL | Status: DC

## 2016-04-06 NOTE — Progress Notes (Signed)
Patient presents to clinic today c/o 2 weeks of sinus pressure, sinus pain, ear pressure with thick purulent sinus drainage. Has also noted chest congestion and cough. Denies fever, chills, chest pain or SOB. Denies recent travel or sick contact. Has tried OTC antihistamine and cough suppressant with little relief in symptoms.  Past Medical History  Diagnosis Date  . GERD (gastroesophageal reflux disease)   . Hypertension 65  . Cancer ([place]) 2007    calf of right leg- melanoma  . Anemia     prior to hysterectomy  . Allergy   . Hyperlipidemia   . Thyroid disease 64  . Preventative health care 03/30/2012  . Knee pain 03/30/2012    L>R  . Cervical cancer screening 06/06/2012  . Vitamin D deficiency 06/06/2012  . Medicare annual wellness visit, subsequent 02/09/2015  . BCC (basal cell carcinoma) 08/01/2015  . DDD (degenerative disc disease), cervical 08/01/2015    Current Outpatient Prescriptions on File Prior to Visit  Medication Sig Dispense Refill  . amLODipine (NORVASC) 5 MG tablet TAKE 1 TABLET(5 MG) BY MOUTH DAILY 90 tablet 0  . aspirin 81 MG tablet Take 81 mg by mouth daily.    . calcium-vitamin D 250-100 MG-UNIT per tablet Take 1 tablet by mouth daily.    . Cholecalciferol (VITAMIN D3) 5000 UNITS CAPS Take 5,000 Units by mouth daily.    Marland Kitchen KRILL OIL PO Take 1 capsule by mouth daily.    . naproxen (NAPROSYN) 500 MG tablet Take 1 tablet (500 mg total) by mouth 2 (two) times daily with a meal. (Patient taking differently: Take 500 mg by mouth as needed. ) 30 tablet 0  . omeprazole (PRILOSEC) 20 MG capsule Take 1 capsule (20 mg total) by mouth daily. 90 capsule 0  . ranitidine (ZANTAC) 300 MG tablet Take 1 tablet (300 mg total) by mouth at bedtime. 90 tablet 1  . Red Yeast Rice Extract (RED YEAST RICE PO) Take 1 capsule by mouth daily.     Marland Kitchen SYNTHROID 75 MCG tablet TAKE 1 TABLET(75 MCG) BY MOUTH DAILY 90 tablet 0   No current facility-administered medications on file prior to visit.     Allergies  Allergen Reactions  . Penicillins Rash    Family History  Problem Relation Age of Onset  . Hypertension Mother   . Alzheimer's disease Mother   . Dementia Mother     alzheimer's  . Aortic aneurysm Father   . Hypertension Father   . COPD Father     smoker  . Heart disease Father     s/p bypass, aortic aneurysm, carotid artery disease  . Cancer Sister 22    breast- remission  . Diabetes Son 22    type 1  . Alzheimer's disease Maternal Grandmother   . Cancer Maternal Grandfather     prostate  . Stroke Paternal Grandfather     Social History   Social History  . Marital Status: Married    Spouse Name: N/A  . Number of Children: N/A  . Years of Education: N/A   Social History Main Topics  . Smoking status: Never Smoker   . Smokeless tobacco: Never Used  . Alcohol Use: No  . Drug Use: No  . Sexual Activity: Not Currently     Comment: lives with husband, retired from [employer] work, no dietary restrictions   Other Topics Concern  . Not on file   Social History Narrative   Review of Systems - See HPI.  All  other ROS are negative.  BP 128/82 mmHg  Pulse 78  Temp(Src) 97.8 F (36.6 C) (Oral)  Resp 16  Ht 5\' 2"  (1.575 m)  Wt 139 lb 8 oz (63.277 kg)  BMI 25.51 kg/m2  SpO2 97%  Physical Exam  Constitutional: She is oriented to person, place, and time and well-developed, well-nourished, and in no distress.  HENT:  Head: Normocephalic and atraumatic.  Right Ear: Tympanic membrane normal.  Left Ear: Tympanic membrane normal.  Nose: Mucosal edema present. Right sinus exhibits maxillary sinus tenderness. Left sinus exhibits maxillary sinus tenderness.  Mouth/Throat: Uvula is midline, oropharynx is clear and moist and mucous membranes are normal.  Eyes: Conjunctivae are normal.  Neck: Neck supple.  Cardiovascular: Normal rate, regular rhythm, normal heart sounds and intact distal pulses.   Pulmonary/Chest: Effort normal. No respiratory  distress. She has no wheezes. She has rales in the left lower field. She exhibits no tenderness.  Neurological: She is alert and oriented to person, place, and time.  Skin: Skin is warm and dry. No rash noted.  Psychiatric: Affect normal.  Vitals reviewed.   Recent Results (from the past 2160 hour(s))  VITAMIN D 25 Hydroxy (Vit-D Deficiency, Fractures)     Status: None   Collection Time: 01/30/16 10:53 AM  Result Value Ref Range   VITD 66.76 30.00 - 100.00 ng/mL  Lipid panel     Status: Abnormal   Collection Time: 01/30/16 10:53 AM  Result Value Ref Range   Cholesterol 205 (H) 0 - 200 mg/dL    Comment: ATP III Classification       Desirable:  < 200 mg/dL               Borderline High:  200 - 239 mg/dL          High:  > = 240 mg/dL   Triglycerides 135.0 0.0 - 149.0 mg/dL    Comment: Normal:  <150 mg/dLBorderline High:  150 - 199 mg/dL   HDL 43.20 >39.00 mg/dL   VLDL 27.0 0.0 - 40.0 mg/dL   LDL Cholesterol 135 (H) 0 - 99 mg/dL   Total CHOL/HDL Ratio 5     Comment:                Men          Women1/2 Average Risk     3.4          3.3Average Risk          5.0          4.42X Average Risk          9.6          7.13X Average Risk          15.0          11.0                       NonHDL 161.69     Comment: NOTE:  Non-HDL goal should be 30 mg/dL higher than patient's LDL goal (i.e. LDL goal of < 70 mg/dL, would have non-HDL goal of < 100 mg/dL)  TSH     Status: None   Collection Time: 01/30/16 10:53 AM  Result Value Ref Range   TSH 2.37 0.35 - 4.50 uIU/mL  CBC     Status: None   Collection Time: 01/30/16 10:53 AM  Result Value Ref Range   WBC 7.3 4.0 - 10.5 K/uL   RBC 4.43 3.87 -  5.11 Mil/uL   Platelets 235.0 150.0 - 400.0 K/uL   Hemoglobin 13.0 12.0 - 15.0 g/dL   HCT 38.7 36.0 - 46.0 %   MCV 87.5 78.0 - 100.0 fl   MCHC 33.6 30.0 - 36.0 g/dL   RDW 13.9 11.5 - 15.5 %  Comprehensive metabolic panel     Status: Abnormal   Collection Time: 01/30/16 10:53 AM  Result Value Ref Range    Sodium 141 135 - 145 mEq/L   Potassium 4.0 3.5 - 5.1 mEq/L   Chloride 107 96 - 112 mEq/L   CO2 25 19 - 32 mEq/L   Glucose, Bld 108 (H) 70 - 99 mg/dL   BUN 10 6 - 23 mg/dL   Creatinine, Ser 0.61 0.40 - 1.20 mg/dL   Total Bilirubin 0.5 0.2 - 1.2 mg/dL   Alkaline Phosphatase 65 39 - 117 U/L   AST 24 0 - 37 U/L   ALT 33 0 - 35 U/L   Total Protein 7.4 6.0 - 8.3 g/dL   Albumin 4.6 3.5 - 5.2 g/dL   Calcium 9.4 8.4 - 10.5 mg/dL   GFR 103.61 >60.00 mL/min    Assessment/Plan: 1. Acute bacterial sinusitis Penicillin-allergic. Rx Levaquin to take as directed. Supportive measures reviewed and OTC medications discussed.  - levofloxacin (LEVAQUIN) 500 MG tablet; Take 1 tablet (500 mg total) by mouth daily.  Dispense: 7 tablet; Refill: 0  2. Chest congestion Giving rales noted, will check CXR to r/o CAP. Levaquin has been started for sinusitis.  - DG Chest 2 View; Future - levofloxacin (LEVAQUIN) 500 MG tablet; Take 1 tablet (500 mg total) by mouth daily.  Dispense: 7 tablet; Refill: 0  3. Rales CXR ordered. Will alter regimen based on results. - DG Chest 2 View; Future

## 2016-04-06 NOTE — Patient Instructions (Signed)
Please go downstairs for x-ray. I will call with results. I want to rule out a walking pneumonia.  Please take antibiotic as directed.  Increase fluid intake.  Use Saline nasal spray.  Take a daily multivitamin. Delsym for cough.  Place a humidifier in the bedroom.    Follow-up will be based on x-ray results and your response to treatment.  Sinusitis Sinusitis is redness, soreness, and swelling (inflammation) of the paranasal sinuses. Paranasal sinuses are air pockets within the bones of your face (beneath the eyes, the middle of the forehead, or above the eyes). In healthy paranasal sinuses, mucus is able to drain out, and air is able to circulate through them by way of your nose. However, when your paranasal sinuses are inflamed, mucus and air can become trapped. This can allow bacteria and other germs to grow and cause infection. Sinusitis can develop quickly and last only a short time (acute) or continue over a long period (chronic). Sinusitis that lasts for more than 12 weeks is considered chronic.  CAUSES  Causes of sinusitis include:  Allergies.  Structural abnormalities, such as displacement of the cartilage that separates your nostrils (deviated septum), which can decrease the air flow through your nose and sinuses and affect sinus drainage.  Functional abnormalities, such as when the small hairs (cilia) that line your sinuses and help remove mucus do not work properly or are not present. SYMPTOMS  Symptoms of acute and chronic sinusitis are the same. The primary symptoms are pain and pressure around the affected sinuses. Other symptoms include:  Upper toothache.  Earache.  Headache.  Bad breath.  Decreased sense of smell and taste.  A cough, which worsens when you are lying flat.  Fatigue.  Fever.  Thick drainage from your nose, which often is green and may contain pus (purulent).  Swelling and warmth over the affected sinuses. DIAGNOSIS  Your caregiver will perform  a physical exam. During the exam, your caregiver may:  Look in your nose for signs of abnormal growths in your nostrils (nasal polyps).  Tap over the affected sinus to check for signs of infection.  View the inside of your sinuses (endoscopy) with a special imaging device with a light attached (endoscope), which is inserted into your sinuses. If your caregiver suspects that you have chronic sinusitis, one or more of the following tests may be recommended:  Allergy tests.  Nasal culture A sample of mucus is taken from your nose and sent to a lab and screened for bacteria.  Nasal cytology A sample of mucus is taken from your nose and examined by your caregiver to determine if your sinusitis is related to an allergy. TREATMENT  Most cases of acute sinusitis are related to a viral infection and will resolve on their own within 10 days. Sometimes medicines are prescribed to help relieve symptoms (pain medicine, decongestants, nasal steroid sprays, or saline sprays).  However, for sinusitis related to a bacterial infection, your caregiver will prescribe antibiotic medicines. These are medicines that will help kill the bacteria causing the infection.  Rarely, sinusitis is caused by a fungal infection. In theses cases, your caregiver will prescribe antifungal medicine. For some cases of chronic sinusitis, surgery is needed. Generally, these are cases in which sinusitis recurs more than 3 times per year, despite other treatments. HOME CARE INSTRUCTIONS   Drink plenty of water. Water helps thin the mucus so your sinuses can drain more easily.  Use a humidifier.  Inhale steam 3 to 4 times a  day (for example, sit in the bathroom with the shower running).  Apply a warm, moist washcloth to your face 3 to 4 times a day, or as directed by your caregiver.  Use saline nasal sprays to help moisten and clean your sinuses.  Take over-the-counter or prescription medicines for pain, discomfort, or fever only  as directed by your caregiver. SEEK IMMEDIATE MEDICAL CARE IF:  You have increasing pain or severe headaches.  You have nausea, vomiting, or drowsiness.  You have swelling around your face.  You have vision problems.  You have a stiff neck.  You have difficulty breathing. MAKE SURE YOU:   Understand these instructions.  Will watch your condition.  Will get help right away if you are not doing well or get worse. Document Released: 10/25/2005 Document Revised: 01/17/2012 Document Reviewed: 11/09/2011 [facility] Patient Information 2014 [place], [place].

## 2016-04-09 NOTE — Telephone Encounter (Signed)
Pre Visit Call. 

## 2016-04-22 DIAGNOSIS — M5432 Sciatica, left side: Secondary | ICD-10-CM | POA: Diagnosis not present

## 2016-04-22 DIAGNOSIS — M9903 Segmental and somatic dysfunction of lumbar region: Secondary | ICD-10-CM | POA: Diagnosis not present

## 2016-05-13 DIAGNOSIS — M9903 Segmental and somatic dysfunction of lumbar region: Secondary | ICD-10-CM | POA: Diagnosis not present

## 2016-05-13 DIAGNOSIS — M5432 Sciatica, left side: Secondary | ICD-10-CM | POA: Diagnosis not present

## 2016-05-18 ENCOUNTER — Other Ambulatory Visit: Payer: Self-pay | Admitting: Family Medicine

## 2016-05-20 DIAGNOSIS — M9903 Segmental and somatic dysfunction of lumbar region: Secondary | ICD-10-CM | POA: Diagnosis not present

## 2016-05-20 DIAGNOSIS — M5432 Sciatica, left side: Secondary | ICD-10-CM | POA: Diagnosis not present

## 2016-05-26 DIAGNOSIS — M5432 Sciatica, left side: Secondary | ICD-10-CM | POA: Diagnosis not present

## 2016-05-26 DIAGNOSIS — M9903 Segmental and somatic dysfunction of lumbar region: Secondary | ICD-10-CM | POA: Diagnosis not present

## 2016-06-01 ENCOUNTER — Other Ambulatory Visit: Payer: Self-pay | Admitting: Family Medicine

## 2016-06-08 DIAGNOSIS — L814 Other melanin hyperpigmentation: Secondary | ICD-10-CM | POA: Diagnosis not present

## 2016-06-08 DIAGNOSIS — Z85820 Personal history of malignant melanoma of skin: Secondary | ICD-10-CM | POA: Diagnosis not present

## 2016-06-08 DIAGNOSIS — Z85828 Personal history of other malignant neoplasm of skin: Secondary | ICD-10-CM | POA: Diagnosis not present

## 2016-06-08 DIAGNOSIS — D1801 Hemangioma of skin and subcutaneous tissue: Secondary | ICD-10-CM | POA: Diagnosis not present

## 2016-06-08 DIAGNOSIS — L821 Other seborrheic keratosis: Secondary | ICD-10-CM | POA: Diagnosis not present

## 2016-06-08 DIAGNOSIS — D2262 Melanocytic nevi of left upper limb, including shoulder: Secondary | ICD-10-CM | POA: Diagnosis not present

## 2016-06-09 DIAGNOSIS — M5432 Sciatica, left side: Secondary | ICD-10-CM | POA: Diagnosis not present

## 2016-06-09 DIAGNOSIS — M9903 Segmental and somatic dysfunction of lumbar region: Secondary | ICD-10-CM | POA: Diagnosis not present

## 2016-06-29 DIAGNOSIS — M9903 Segmental and somatic dysfunction of lumbar region: Secondary | ICD-10-CM | POA: Diagnosis not present

## 2016-06-29 DIAGNOSIS — M5432 Sciatica, left side: Secondary | ICD-10-CM | POA: Diagnosis not present

## 2016-06-30 DIAGNOSIS — Z803 Family history of malignant neoplasm of breast: Secondary | ICD-10-CM | POA: Diagnosis not present

## 2016-06-30 DIAGNOSIS — Z1231 Encounter for screening mammogram for malignant neoplasm of breast: Secondary | ICD-10-CM | POA: Diagnosis not present

## 2016-06-30 LAB — HM MAMMOGRAPHY

## 2016-07-06 ENCOUNTER — Encounter: Payer: Self-pay | Admitting: Family Medicine

## 2016-07-19 DIAGNOSIS — M5432 Sciatica, left side: Secondary | ICD-10-CM | POA: Diagnosis not present

## 2016-07-19 DIAGNOSIS — M9903 Segmental and somatic dysfunction of lumbar region: Secondary | ICD-10-CM | POA: Diagnosis not present

## 2016-08-02 ENCOUNTER — Ambulatory Visit: Payer: Medicare Other | Admitting: Family Medicine

## 2016-08-05 DIAGNOSIS — M5432 Sciatica, left side: Secondary | ICD-10-CM | POA: Diagnosis not present

## 2016-08-05 DIAGNOSIS — M9903 Segmental and somatic dysfunction of lumbar region: Secondary | ICD-10-CM | POA: Diagnosis not present

## 2016-08-19 ENCOUNTER — Ambulatory Visit (INDEPENDENT_AMBULATORY_CARE_PROVIDER_SITE_OTHER): Payer: Medicare Other | Admitting: Family Medicine

## 2016-08-19 ENCOUNTER — Encounter: Payer: Self-pay | Admitting: Family Medicine

## 2016-08-19 VITALS — BP 130/76 | HR 84 | Temp 98.1°F | Ht 62.0 in | Wt 133.2 lb

## 2016-08-19 DIAGNOSIS — K2100 Gastro-esophageal reflux disease with esophagitis, without bleeding: Secondary | ICD-10-CM

## 2016-08-19 DIAGNOSIS — E079 Disorder of thyroid, unspecified: Secondary | ICD-10-CM | POA: Diagnosis not present

## 2016-08-19 DIAGNOSIS — Z0000 Encounter for general adult medical examination without abnormal findings: Secondary | ICD-10-CM

## 2016-08-19 DIAGNOSIS — Z23 Encounter for immunization: Secondary | ICD-10-CM

## 2016-08-19 DIAGNOSIS — R739 Hyperglycemia, unspecified: Secondary | ICD-10-CM

## 2016-08-19 DIAGNOSIS — M8580 Other specified disorders of bone density and structure, unspecified site: Secondary | ICD-10-CM | POA: Diagnosis not present

## 2016-08-19 DIAGNOSIS — E559 Vitamin D deficiency, unspecified: Secondary | ICD-10-CM | POA: Diagnosis not present

## 2016-08-19 DIAGNOSIS — E785 Hyperlipidemia, unspecified: Secondary | ICD-10-CM

## 2016-08-19 DIAGNOSIS — I10 Essential (primary) hypertension: Secondary | ICD-10-CM

## 2016-08-19 DIAGNOSIS — C439 Malignant melanoma of skin, unspecified: Secondary | ICD-10-CM

## 2016-08-19 HISTORY — DX: Malignant melanoma of skin, unspecified: C43.9

## 2016-08-19 MED ORDER — SYNTHROID 75 MCG PO TABS: 75.0000 ug | ORAL_TABLET | Freq: Every day | ORAL | 2 refills | Status: DC

## 2016-08-19 MED ORDER — AMLODIPINE BESYLATE 5 MG PO TABS: ORAL_TABLET | ORAL | 2 refills | Status: DC

## 2016-08-19 MED ORDER — OMEPRAZOLE 20 MG PO CPDR: 20.0000 mg | DELAYED_RELEASE_CAPSULE | Freq: Every day | ORAL | 2 refills | Status: DC

## 2016-08-19 NOTE — Progress Notes (Signed)
Pre visit review using our clinic review tool, if applicable. No additional management support is needed unless otherwise documented below in the visit note. 

## 2016-08-19 NOTE — Patient Instructions (Signed)
Hypertension Hypertension, commonly called high blood pressure, is when the force of blood pumping through your arteries is too strong. Your arteries are the blood vessels that carry blood from your heart throughout your body. A blood pressure reading consists of a higher number over a lower number, such as 110/72. The higher number (systolic) is the pressure inside your arteries when your heart pumps. The lower number (diastolic) is the pressure inside your arteries when your heart relaxes. Ideally you want your blood pressure below 120/80. Hypertension forces your heart to work harder to pump blood. Your arteries may become narrow or stiff. Having untreated or uncontrolled hypertension can cause heart attack, stroke, kidney disease, and other problems. RISK FACTORS Some risk factors for high blood pressure are controllable. Others are not.  Risk factors you cannot control include:   Race. You may be at higher risk if you are African American.  Age. Risk increases with age.  Gender. Men are at higher risk than women before age 45 years. After age 65, women are at higher risk than men. Risk factors you can control include:  Not getting enough exercise or physical activity.  Being overweight.  Getting too much fat, sugar, calories, or salt in your diet.  Drinking too much alcohol. SIGNS AND SYMPTOMS Hypertension does not usually cause signs or symptoms. Extremely high blood pressure (hypertensive crisis) may cause headache, anxiety, shortness of breath, and nosebleed. DIAGNOSIS To check if you have hypertension, your health care provider will measure your blood pressure while you are seated, with your arm held at the level of your heart. It should be measured at least twice using the same arm. Certain conditions can cause a difference in blood pressure between your right and left arms. A blood pressure reading that is higher than normal on one occasion does not mean that you need treatment. If  it is not clear whether you have high blood pressure, you may be asked to return on a different day to have your blood pressure checked again. Or, you may be asked to monitor your blood pressure at home for 1 or more weeks. TREATMENT Treating high blood pressure includes making lifestyle changes and possibly taking medicine. Living a healthy lifestyle can help lower high blood pressure. You may need to change some of your habits. Lifestyle changes may include:  Following the DASH diet. This diet is high in fruits, vegetables, and whole grains. It is low in salt, red meat, and added sugars.  Keep your sodium intake below 2,300 mg per day.  Getting at least 30-45 minutes of aerobic exercise at least 4 times per week.  Losing weight if necessary.  Not smoking.  Limiting alcoholic beverages.  Learning ways to reduce stress. Your health care provider may prescribe medicine if lifestyle changes are not enough to get your blood pressure under control, and if one of the following is true:  You are 18-59 years of age and your systolic blood pressure is above 140.  You are 60 years of age or older, and your systolic blood pressure is above 150.  Your diastolic blood pressure is above 90.  You have diabetes, and your systolic blood pressure is over 140 or your diastolic blood pressure is over 90.  You have kidney disease and your blood pressure is above 140/90.  You have heart disease and your blood pressure is above 140/90. Your personal target blood pressure may vary depending on your medical conditions, your age, and other factors. HOME CARE INSTRUCTIONS    Have your blood pressure rechecked as directed by your health care provider.   Take medicines only as directed by your health care provider. Follow the directions carefully. Blood pressure medicines must be taken as prescribed. The medicine does not work as well when you skip doses. Skipping doses also puts you at risk for  problems.  Do not smoke.   Monitor your blood pressure at home as directed by your health care provider. SEEK MEDICAL CARE IF:   You think you are having a reaction to medicines taken.  You have recurrent headaches or feel dizzy.  You have swelling in your ankles.  You have trouble with your vision. SEEK IMMEDIATE MEDICAL CARE IF:  You develop a severe headache or confusion.  You have unusual weakness, numbness, or feel faint.  You have severe chest or abdominal pain.  You vomit repeatedly.  You have trouble breathing. MAKE SURE YOU:   Understand these instructions.  Will watch your condition.  Will get help right away if you are not doing well or get worse.   This information is not intended to replace advice given to you by your health care provider. Make sure you discuss any questions you have with your health care provider.   Document Released: 10/25/2005 Document Revised: 03/11/2015 Document Reviewed: 08/17/2013 Elsevier Interactive Patient Education 2016 [employer]  

## 2016-08-23 DIAGNOSIS — M9903 Segmental and somatic dysfunction of lumbar region: Secondary | ICD-10-CM | POA: Diagnosis not present

## 2016-08-23 DIAGNOSIS — M5432 Sciatica, left side: Secondary | ICD-10-CM | POA: Diagnosis not present

## 2016-08-25 NOTE — Assessment & Plan Note (Signed)
Encouraged heart healthy diet, increase exercise, avoid trans fats, consider a krill oil cap daily 

## 2016-08-25 NOTE — Progress Notes (Signed)
Patient ID: Tammy Grimes, female   DOB: [birth year], 70 y.o.   MRN: [id number]   Subjective:    Patient ID: Tammy Grimes, female    DOB: 21-Mar-1946, 70 y.o.   MRN: [id number]  Chief Complaint  Patient presents with  . Follow-up    HPI Patient is in today for follow up. She denies any recent illness or acute concerns. She took her flu shot today and tolerated it well. No recent febrile illness. She continues to try and maintain a heart healthy diet and regular exercise. Denies CP/palp/SOB/HA/congestion/fevers/GI or GU c/o. Taking meds as prescribed  Past Medical History:  Diagnosis Date  . Allergy   . Anemia    prior to hysterectomy  . BCC (basal cell carcinoma) 08/01/2015  . Cancer ([place]) 2007   calf of right leg- melanoma  . Cervical cancer screening 06/06/2012  . DDD (degenerative disc disease), cervical 08/01/2015  . GERD (gastroesophageal reflux disease)   . Hyperlipidemia   . Hypertension 70  . Knee pain 03/30/2012   L>R  . Medicare annual wellness visit, subsequent 02/09/2015  . Melanoma of skin ([place]) 08/19/2016  . Preventative health care 03/30/2012  . Thyroid disease 70  . Vitamin D deficiency 06/06/2012    Past Surgical History:  Procedure Laterality Date  . ABDOMINAL HYSTERECTOMY  1990's   total, for heavy bleeding and fibroids  . BREAST SURGERY  early 70's   fibroid tumors removed- benign  . CATARACT EXTRACTION Bilateral 12/09/12  . EYE SURGERY Bilateral 2015   cataracts    Family History  Problem Relation Age of Onset  . Hypertension Mother   . Alzheimer's disease Mother   . Dementia Mother     alzheimer's  . Aortic aneurysm Father   . Hypertension Father   . COPD Father     smoker  . Heart disease Father     s/p bypass, aortic aneurysm, carotid artery disease  . Cancer Sister 21    breast- remission  . Diabetes Son 22    type 1  . Alzheimer's disease Maternal Grandmother   . Cancer Maternal Grandfather     prostate  . Stroke Paternal Grandfather      Social History   Social History  . Marital status: Married    Spouse name: N/A  . Number of children: N/A  . Years of education: N/A   Occupational History  . Not on file.   Social History Main Topics  . Smoking status: Never Smoker  . Smokeless tobacco: Never Used  . Alcohol use No  . Drug use: No  . Sexual activity: Not Currently     Comment: lives with husband, retired from [employer] work, no dietary restrictions   Other Topics Concern  . Not on file   Social History Narrative  . No narrative on file    Outpatient Medications Prior to Visit  Medication Sig Dispense Refill  . aspirin 81 MG tablet Take 81 mg by mouth daily.    . calcium-vitamin D 250-100 MG-UNIT per tablet Take 1 tablet by mouth daily.    . Cholecalciferol (VITAMIN D3) 5000 UNITS CAPS Take 5,000 Units by mouth daily.    Marland Kitchen KRILL OIL PO Take 1 capsule by mouth daily.    . ranitidine (ZANTAC) 300 MG tablet Take 1 tablet (300 mg total) by mouth at bedtime. 90 tablet 1  . Red Yeast Rice Extract (RED YEAST RICE PO) Take 1 capsule by mouth daily.     Marland Kitchen  amLODipine (NORVASC) 5 MG tablet TAKE 1 TABLET(5 MG) BY MOUTH DAILY 90 tablet 0  . omeprazole (PRILOSEC) 20 MG capsule TAKE 1 CAPSULE(20 MG) BY MOUTH DAILY 90 capsule 0  . SYNTHROID 75 MCG tablet TAKE 1 TABLET(75 MCG) BY MOUTH DAILY 90 tablet 0  . naproxen (NAPROSYN) 500 MG tablet Take 1 tablet (500 mg total) by mouth 2 (two) times daily with a meal. (Patient not taking: Reported on 08/19/2016) 30 tablet 0  . levofloxacin (LEVAQUIN) 500 MG tablet Take 1 tablet (500 mg total) by mouth daily. 7 tablet 0   No facility-administered medications prior to visit.     Allergies  Allergen Reactions  . Penicillins Rash    Review of Systems  Constitutional: Negative for fever and malaise/fatigue.  HENT: Negative for congestion.   Eyes: Negative for blurred vision.  Respiratory: Negative for shortness of breath.   Cardiovascular: Negative for chest pain,  palpitations and leg swelling.  Gastrointestinal: Negative for abdominal pain, blood in stool and nausea.  Genitourinary: Negative for dysuria and frequency.  Musculoskeletal: Negative for falls.  Skin: Negative for rash.  Neurological: Negative for dizziness and headaches.  Endo/Heme/Allergies: Negative for environmental allergies.  Psychiatric/Behavioral: The patient is not nervous/anxious.        Objective:    Physical Exam  Constitutional: She is oriented to person, place, and time. She appears well-developed and well-nourished. No distress.  HENT:  Head: Normocephalic and atraumatic.  Nose: Nose normal.  Eyes: Right eye exhibits no discharge. Left eye exhibits no discharge.  Neck: Normal range of motion. Neck supple.  Cardiovascular: Normal rate and regular rhythm.   No murmur heard. Pulmonary/Chest: Effort normal and breath sounds normal.  Abdominal: Soft. Bowel sounds are normal. There is no tenderness.  Musculoskeletal: She exhibits no edema.  Neurological: She is alert and oriented to person, place, and time.  Skin: Skin is warm and dry.  Psychiatric: She has a normal mood and affect.  Nursing note and vitals reviewed.   BP 130/76   Pulse 84   Temp 98.1 F (36.7 C) (Oral)   Ht 5\' 2"  (1.575 m)   Wt 133 lb 4 oz (60.4 kg)   SpO2 99%   BMI 24.37 kg/m  Wt Readings from Last 3 Encounters:  08/19/16 133 lb 4 oz (60.4 kg)  04/06/16 139 lb 8 oz (63.3 kg)  01/30/16 144 lb 8 oz (65.5 kg)     Lab Results  Component Value Date   WBC 7.3 01/30/2016   HGB 13.0 01/30/2016   HCT 38.7 01/30/2016   PLT 235.0 01/30/2016   GLUCOSE 108 (H) 01/30/2016   CHOL 205 (H) 01/30/2016   TRIG 135.0 01/30/2016   HDL 43.20 01/30/2016   LDLDIRECT 152.0 08/01/2015   LDLCALC 135 (H) 01/30/2016   ALT 33 01/30/2016   AST 24 01/30/2016   NA 141 01/30/2016   K 4.0 01/30/2016   CL 107 01/30/2016   CREATININE 0.61 01/30/2016   BUN 10 01/30/2016   CO2 25 01/30/2016   TSH 2.37  01/30/2016    Lab Results  Component Value Date   TSH 2.37 01/30/2016   Lab Results  Component Value Date   WBC 7.3 01/30/2016   HGB 13.0 01/30/2016   HCT 38.7 01/30/2016   MCV 87.5 01/30/2016   PLT 235.0 01/30/2016   Lab Results  Component Value Date   NA 141 01/30/2016   K 4.0 01/30/2016   CO2 25 01/30/2016   GLUCOSE 108 (H) 01/30/2016  BUN 10 01/30/2016   CREATININE 0.61 01/30/2016   BILITOT 0.5 01/30/2016   ALKPHOS 65 01/30/2016   AST 24 01/30/2016   ALT 33 01/30/2016   PROT 7.4 01/30/2016   ALBUMIN 4.6 01/30/2016   CALCIUM 9.4 01/30/2016   GFR 103.61 01/30/2016   Lab Results  Component Value Date   CHOL 205 (H) 01/30/2016   Lab Results  Component Value Date   HDL 43.20 01/30/2016   Lab Results  Component Value Date   LDLCALC 135 (H) 01/30/2016   Lab Results  Component Value Date   TRIG 135.0 01/30/2016   Lab Results  Component Value Date   CHOLHDL 5 01/30/2016   No results found for: HGBA1C     Assessment & Plan:   Problem List Items Addressed This Visit    Thyroid disease    On Levothyroxine, continue to monitor      Relevant Medications   SYNTHROID 75 MCG tablet   Other Relevant Orders   TSH   GERD (gastroesophageal reflux disease)   Relevant Medications   omeprazole (PRILOSEC) 20 MG capsule   Hypertension    Well controlled, no changes to meds. Encouraged heart healthy diet such as the DASH diet and exercise as tolerated.       Relevant Medications   amLODipine (NORVASC) 5 MG tablet   Other Relevant Orders   TSH   CBC   Comprehensive metabolic panel   Hyperlipidemia    Encouraged heart healthy diet, increase exercise, avoid trans fats, consider a krill oil cap daily      Relevant Medications   amLODipine (NORVASC) 5 MG tablet   Other Relevant Orders   Lipid panel   Preventative health care   Relevant Orders   Hepatitis C Antibody   Osteopenia    Encouraged to get adequate exercise, calcium and vitamin d intake       Vitamin D deficiency - Primary   Relevant Orders   VITAMIN D 25 Hydroxy (Vit-D Deficiency, Fractures)   Melanoma of skin ([place])    Follows with dermatology and has no new recent lesions, continue to avoid the sun       Other Visit Diagnoses    Encounter for immunization       Relevant Medications   amLODipine (NORVASC) 5 MG tablet   SYNTHROID 75 MCG tablet   omeprazole (PRILOSEC) 20 MG capsule   Other Relevant Orders   Flu vaccine HIGH DOSE PF (Completed)   Hyperglycemia       Relevant Orders   Hemoglobin A1c      I have discontinued Ms. Micalizzi's levofloxacin. I have also changed her SYNTHROID and omeprazole. Additionally, I am having her maintain her aspirin, KRILL OIL PO, calcium-vitamin D, Red Yeast Rice Extract (RED YEAST RICE PO), ranitidine, Vitamin D3, naproxen, and amLODipine.  Meds ordered this encounter  Medications  . amLODipine (NORVASC) 5 MG tablet    Sig: TAKE 1 TABLET(5 MG) BY MOUTH DAILY    Dispense:  90 tablet    Refill:  2  . SYNTHROID 75 MCG tablet    Sig: Take 1 tablet (75 mcg total) by mouth daily before breakfast.    Dispense:  90 tablet    Refill:  2  . omeprazole (PRILOSEC) 20 MG capsule    Sig: Take 1 capsule (20 mg total) by mouth daily.    Dispense:  90 capsule    Refill:  2     Penni Homans, MD

## 2016-08-25 NOTE — Assessment & Plan Note (Signed)
Well controlled, no changes to meds. Encouraged heart healthy diet such as the DASH diet and exercise as tolerated.  °

## 2016-08-25 NOTE — Assessment & Plan Note (Signed)
On Levothyroxine, continue to monitor 

## 2016-08-25 NOTE — Assessment & Plan Note (Signed)
Encouraged to get adequate exercise, calcium and vitamin d intake 

## 2016-08-25 NOTE — Assessment & Plan Note (Signed)
Follows with dermatology and has no new recent lesions, continue to avoid the sun

## 2016-09-13 DIAGNOSIS — M5432 Sciatica, left side: Secondary | ICD-10-CM | POA: Diagnosis not present

## 2016-09-13 DIAGNOSIS — M9903 Segmental and somatic dysfunction of lumbar region: Secondary | ICD-10-CM | POA: Diagnosis not present

## 2016-10-04 DIAGNOSIS — M9903 Segmental and somatic dysfunction of lumbar region: Secondary | ICD-10-CM | POA: Diagnosis not present

## 2016-10-04 DIAGNOSIS — M5432 Sciatica, left side: Secondary | ICD-10-CM | POA: Diagnosis not present

## 2016-10-08 ENCOUNTER — Encounter: Payer: Self-pay | Admitting: Internal Medicine

## 2016-10-12 ENCOUNTER — Encounter: Payer: Self-pay | Admitting: *Deleted

## 2016-10-12 ENCOUNTER — Telehealth: Payer: Self-pay | Admitting: *Deleted

## 2016-10-12 NOTE — Telephone Encounter (Signed)
Called patient and left message to return call to schedule AWV w/ [facility].  MyChart message sent to pt as well. 

## 2016-10-21 ENCOUNTER — Telehealth: Payer: Self-pay | Admitting: Family Medicine

## 2016-10-21 NOTE — Telephone Encounter (Signed)
Left message for patient to call office to schedule annual wellness appointment.

## 2016-10-27 DIAGNOSIS — M9903 Segmental and somatic dysfunction of lumbar region: Secondary | ICD-10-CM | POA: Diagnosis not present

## 2016-10-27 DIAGNOSIS — M5432 Sciatica, left side: Secondary | ICD-10-CM | POA: Diagnosis not present

## 2016-11-17 DIAGNOSIS — M9903 Segmental and somatic dysfunction of lumbar region: Secondary | ICD-10-CM | POA: Diagnosis not present

## 2016-11-17 DIAGNOSIS — M5432 Sciatica, left side: Secondary | ICD-10-CM | POA: Diagnosis not present

## 2016-11-29 ENCOUNTER — Encounter: Payer: Self-pay | Admitting: Family Medicine

## 2016-11-29 NOTE — Telephone Encounter (Signed)
error:[id number] ° °

## 2016-11-29 NOTE — Telephone Encounter (Signed)
Patient scheduled medicare wellness appointment with [facility] for 03/28/2017 at [place] and follow up with PCP at 10am

## 2016-12-15 DIAGNOSIS — M5432 Sciatica, left side: Secondary | ICD-10-CM | POA: Diagnosis not present

## 2016-12-15 DIAGNOSIS — M9903 Segmental and somatic dysfunction of lumbar region: Secondary | ICD-10-CM | POA: Diagnosis not present

## 2017-01-05 DIAGNOSIS — M9903 Segmental and somatic dysfunction of lumbar region: Secondary | ICD-10-CM | POA: Diagnosis not present

## 2017-01-05 DIAGNOSIS — M5432 Sciatica, left side: Secondary | ICD-10-CM | POA: Diagnosis not present

## 2017-01-12 DIAGNOSIS — M5432 Sciatica, left side: Secondary | ICD-10-CM | POA: Diagnosis not present

## 2017-01-12 DIAGNOSIS — M9903 Segmental and somatic dysfunction of lumbar region: Secondary | ICD-10-CM | POA: Diagnosis not present

## 2017-02-02 DIAGNOSIS — M5432 Sciatica, left side: Secondary | ICD-10-CM | POA: Diagnosis not present

## 2017-02-02 DIAGNOSIS — M9903 Segmental and somatic dysfunction of lumbar region: Secondary | ICD-10-CM | POA: Diagnosis not present

## 2017-02-23 DIAGNOSIS — M5432 Sciatica, left side: Secondary | ICD-10-CM | POA: Diagnosis not present

## 2017-02-23 DIAGNOSIS — M9903 Segmental and somatic dysfunction of lumbar region: Secondary | ICD-10-CM | POA: Diagnosis not present

## 2017-03-09 DIAGNOSIS — M9903 Segmental and somatic dysfunction of lumbar region: Secondary | ICD-10-CM | POA: Diagnosis not present

## 2017-03-09 DIAGNOSIS — M5432 Sciatica, left side: Secondary | ICD-10-CM | POA: Diagnosis not present

## 2017-03-23 ENCOUNTER — Encounter: Payer: Self-pay | Admitting: Internal Medicine

## 2017-03-25 NOTE — Progress Notes (Signed)
Subjective:   Tammy Grimes is a 71 y.o. female who presents for Medicare Annual (Subsequent) preventive examination.  Review of Systems:  No ROS.  Medicare Wellness Visit.  Cardiac Risk Factors include: advanced age (>29men, >75 women);dyslipidemia;hypertension;sedentary lifestyle Sleep patterns: Sleeps 7-8 hrs/ night. Wakes 1x to urinate. Go back to sleep easily. Home Safety/Smoke Alarms:  Feels safe in home. Smoke alarms in place.  Living environment; residence and Firearm Safety: Lives with husband in 1 story home.Guns safely stored. Seat Belt Safety/Bike Helmet: Wears seat belt.   Counseling:   Eye Exam- Hx cataract sx. Wears reading gasses. Dr.Shapiro yearly. Dental- Dr.Booth every 6 months.  Female:   Pap-Last 06/06/12-negative   Hysterectomy  Mammo- Last 06/30/16: BI-RADS Category 2:benign       Dexa scan- Last 02/05/16: osteopenia        CCS-Last 09/19/06: normal. Has appt scheduled July 2018.     Objective:     Vitals: BP 140/80 (BP Location: Right Arm, Patient Position: Sitting, Cuff Size: Normal)   Pulse 83   Ht 5\' 2"  (2.951 m)   Wt 138 lb 3.2 oz (62.7 kg)   SpO2 97%   BMI 25.28 kg/m   Body mass index is 25.28 kg/m.   Tobacco History  Smoking Status  . Never Smoker  Smokeless Tobacco  . Never Used     Counseling given: Not Answered   Past Medical History:  Diagnosis Date  . Allergy   . Anemia    prior to hysterectomy  . BCC (basal cell carcinoma) 08/01/2015  . Cancer ([place]) 2007   calf of right leg- melanoma  . Cervical cancer screening 06/06/2012  . DDD (degenerative disc disease), cervical 08/01/2015  . GERD (gastroesophageal reflux disease)   . Hyperlipidemia   . Hypertension 65  . Knee pain 03/30/2012   L>R  . Medicare annual wellness visit, subsequent 02/09/2015  . Melanoma of skin ([place]) 08/19/2016  . Preventative health care 03/30/2012  . Thyroid disease 64  . Vitamin D deficiency 06/06/2012   Past Surgical History:  Procedure  Laterality Date  . ABDOMINAL HYSTERECTOMY  1990's   total, for heavy bleeding and fibroids  . BREAST SURGERY  early 70's   fibroid tumors removed- benign  . CATARACT EXTRACTION Bilateral 12/09/12  . EYE SURGERY Bilateral 2015   cataracts   Family History  Problem Relation Age of Onset  . Hypertension Mother   . Alzheimer's disease Mother   . Dementia Mother        alzheimer's  . Aortic aneurysm Father   . Hypertension Father   . COPD Father        smoker  . Heart disease Father        s/p bypass, aortic aneurysm, carotid artery disease  . Cancer Sister 70       breast- remission  . Diabetes Son 22       type 1  . Alzheimer's disease Maternal Grandmother   . Cancer Maternal Grandfather        prostate  . Stroke Paternal Grandfather    History  Sexual Activity  . Sexual activity: Yes    Comment: lives with husband, retired from [employer] work, no dietary restrictions    Outpatient Encounter Prescriptions as of 03/28/2017  Medication Sig  . amLODipine (NORVASC) 5 MG tablet TAKE 1 TABLET(5 MG) BY MOUTH DAILY  . aspirin 81 MG tablet Take 81 mg by mouth daily.  . calcium-vitamin D 250-100 MG-UNIT per tablet Take  1 tablet by mouth daily.  . Cholecalciferol (VITAMIN D3) 5000 UNITS CAPS Take 5,000 Units by mouth daily.  Marland Kitchen KRILL OIL PO Take 1 capsule by mouth daily.  Marland Kitchen omeprazole (PRILOSEC) 20 MG capsule Take 1 capsule (20 mg total) by mouth daily.  . ranitidine (ZANTAC) 300 MG tablet Take 1 tablet (300 mg total) by mouth at bedtime.  . Red Yeast Rice Extract (RED YEAST RICE PO) Take 1 capsule by mouth daily.   Marland Kitchen SYNTHROID 75 MCG tablet Take 1 tablet (75 mcg total) by mouth daily before breakfast.  . naproxen (NAPROSYN) 500 MG tablet Take 1 tablet (500 mg total) by mouth 2 (two) times daily with a meal. (Patient not taking: Reported on 08/19/2016)   No facility-administered encounter medications on file as of 03/28/2017.     Activities of Daily Living In your present  state of health, do you have any difficulty performing the following activities: 03/28/2017  Hearing? N  Vision? N  Difficulty concentrating or making decisions? N  Walking or climbing stairs? Y  Dressing or bathing? N  Doing errands, shopping? N  Preparing Food and eating ? N  Using the Toilet? N  In the past six months, have you accidently leaked urine? N  Do you have problems with loss of bowel control? N  Managing your Medications? N  Managing your Finances? N  Housekeeping or managing your Housekeeping? N  Some recent data might be hidden    Patient Care Team: [name], MD as PCP - General (Family Medicine) Martinique, Amy, MD as Consulting Physician (Dermatology) Rutherford Guys, MD as Consulting Physician (Ophthalmology)    Assessment:    Physical assessment deferred to PCP.  Exercise Activities and Dietary recommendations Current Exercise Habits: The patient does not participate in regular exercise at present, Exercise limited by: None identified   Diet (meal preparation, eat out, water intake, caffeinated beverages, dairy products, fruits and vegetables): in general, a "healthy" diet    Goals      Patient Stated   . Begin exercising again at least 2x/week (pt-stated)      Other   . Weight (lb) < 130 lb (59 kg)      Fall Risk Fall Risk  03/28/2017 01/30/2016 01/28/2015 07/28/2013  Falls in the past year? No Yes No No  Number falls in past yr: - 1 - -  Injury with Fall? - Yes - -  Follow up - Falls evaluation completed;Follow up appointment - -   Depression Screen PHQ 2/9 Scores 03/28/2017 01/30/2016 01/28/2015 07/28/2013  PHQ - 2 Score 0 0 0 0     Cognitive Function Ad8 score reviewed for issues:  Issues making decisions:no  Less interest in hobbies / activities:no  Repeats questions, stories (family complaining):no  Trouble using ordinary gadgets (microwave, computer, phone):no  Forgets the month or year: no  Mismanaging finances: no  Remembering  appts:no Daily problems with thinking and/or memory:no Ad8 score is=0           Immunization History  Administered Date(s) Administered  . Influenza Split 08/31/2012  . Influenza Whole 08/09/2011  . Influenza, High Dose Seasonal PF 08/19/2016  . Influenza,inj,Quad PF,36+ Mos 07/24/2013, 08/01/2015  . Influenza-Unspecified 07/24/2014  . Pneumococcal Conjugate-13 07/24/2013  . Pneumococcal Polysaccharide-23 08/01/2015  . Td 11/08/2005  . Tdap 01/30/2016  . Zoster 06/07/2012   Screening Tests Health Maintenance  Topic Date Due  . Hepatitis C Screening  08-14-1946  . COLONOSCOPY  09/19/2016  . INFLUENZA VACCINE  06/08/2017  . MAMMOGRAM  06/30/2017  . TETANUS/TDAP  01/29/2026  . DEXA SCAN  Completed  . PNA vac Low Risk Adult  Completed      Plan:     Follow up with PCP today as scheduled  Continue to eat heart healthy diet (full of fruits, vegetables, whole grains, lean protein, water--limit salt, fat, and sugar intake) and increase physical activity as tolerated.  Continue doing brain stimulating activities (puzzles, reading, adult coloring books, staying active) to keep memory sharp.   I have personally reviewed and noted the following in the patient's chart:   . Medical and social history . Use of alcohol, tobacco or illicit drugs  . Current medications and supplements . Functional ability and status . Nutritional status . Physical activity . Advanced directives . List of other physicians . Hospitalizations, surgeries, and ER visits in previous 12 months . Vitals . Screenings to include cognitive, depression, and falls . Referrals and appointments  In addition, I have reviewed and discussed with patient certain preventive protocols, quality metrics, and best practice recommendations. A written personalized care plan for preventive services as well as general preventive health recommendations were provided to patient.     Shela Nevin,  [place]  03/28/2017

## 2017-03-28 ENCOUNTER — Ambulatory Visit (INDEPENDENT_AMBULATORY_CARE_PROVIDER_SITE_OTHER): Payer: Medicare Other | Admitting: Family Medicine

## 2017-03-28 ENCOUNTER — Encounter: Payer: Self-pay | Admitting: Family Medicine

## 2017-03-28 VITALS — BP 140/80 | HR 83 | Ht 62.0 in | Wt 138.2 lb

## 2017-03-28 DIAGNOSIS — E079 Disorder of thyroid, unspecified: Secondary | ICD-10-CM | POA: Diagnosis not present

## 2017-03-28 DIAGNOSIS — R0602 Shortness of breath: Secondary | ICD-10-CM | POA: Diagnosis not present

## 2017-03-28 DIAGNOSIS — M8580 Other specified disorders of bone density and structure, unspecified site: Secondary | ICD-10-CM | POA: Diagnosis not present

## 2017-03-28 DIAGNOSIS — Z7289 Other problems related to lifestyle: Secondary | ICD-10-CM | POA: Diagnosis not present

## 2017-03-28 DIAGNOSIS — Z0000 Encounter for general adult medical examination without abnormal findings: Secondary | ICD-10-CM

## 2017-03-28 DIAGNOSIS — E785 Hyperlipidemia, unspecified: Secondary | ICD-10-CM | POA: Diagnosis not present

## 2017-03-28 DIAGNOSIS — E559 Vitamin D deficiency, unspecified: Secondary | ICD-10-CM | POA: Diagnosis not present

## 2017-03-28 DIAGNOSIS — I10 Essential (primary) hypertension: Secondary | ICD-10-CM

## 2017-03-28 DIAGNOSIS — R739 Hyperglycemia, unspecified: Secondary | ICD-10-CM

## 2017-03-28 HISTORY — DX: Hyperglycemia, unspecified: R73.9

## 2017-03-28 LAB — LIPID PANEL
CHOL/HDL RATIO: 5
Cholesterol: 239 mg/dL — ABNORMAL HIGH (ref 0–200)
HDL: 45.4 mg/dL (ref >39.00)
LDL Cholesterol: 158 mg/dL — ABNORMAL HIGH (ref 0–99)
NonHDL: 193.45
TRIGLYCERIDES: 177 mg/dL — AB (ref 0.0–149.0)
VLDL: 35.4 mg/dL (ref 0.0–40.0)

## 2017-03-28 LAB — COMPREHENSIVE METABOLIC PANEL
ALT: 22 U/L (ref 0–35)
AST: 20 U/L (ref 0–37)
Albumin: 5 g/dL (ref 3.5–5.2)
Alkaline Phosphatase: 73 U/L (ref 39–117)
BILIRUBIN TOTAL: 0.5 mg/dL (ref 0.2–1.2)
BUN: 13 mg/dL (ref 6–23)
CALCIUM: 9.8 mg/dL (ref 8.4–10.5)
CO2: 27 meq/L (ref 19–32)
Chloride: 105 mEq/L (ref 96–112)
Creatinine, Ser: 0.64 mg/dL (ref 0.40–1.20)
GFR: 97.69 mL/min (ref >60.00)
Glucose, Bld: 114 mg/dL — ABNORMAL HIGH (ref 70–99)
POTASSIUM: 4.7 meq/L (ref 3.5–5.1)
Sodium: 141 mEq/L (ref 135–145)
Total Protein: 7.6 g/dL (ref 6.0–8.3)

## 2017-03-28 LAB — TSH: TSH: 2.47 u[IU]/mL (ref 0.35–4.50)

## 2017-03-28 LAB — VITAMIN D 25 HYDROXY (VIT D DEFICIENCY, FRACTURES): VITD: 76.62 ng/mL (ref 30.00–100.00)

## 2017-03-28 LAB — CBC
HEMATOCRIT: 42 % (ref 36.0–46.0)
HEMOGLOBIN: 13.8 g/dL (ref 12.0–15.0)
MCHC: 32.7 g/dL (ref 30.0–36.0)
MCV: 91.5 fl (ref 78.0–100.0)
PLATELETS: 218 10*3/uL (ref 150.0–400.0)
RBC: 4.59 Mil/uL (ref 3.87–5.11)
RDW: 13.5 % (ref 11.5–15.5)
WBC: 7.3 10*3/uL (ref 4.0–10.5)

## 2017-03-28 LAB — HEMOGLOBIN A1C: Hgb A1c MFr Bld: 5.9 % (ref 4.6–6.5)

## 2017-03-28 NOTE — Assessment & Plan Note (Signed)
Encouraged to get adequate exercise, calcium and vitamin d intake 

## 2017-03-28 NOTE — Assessment & Plan Note (Signed)
hgba1c acceptable, minimize simple carbs. Increase exercise as tolerated.  

## 2017-03-28 NOTE — Progress Notes (Signed)
Pre visit review using our clinic review tool, if applicable. No additional management support is needed unless otherwise documented below in the visit note. 

## 2017-03-28 NOTE — Assessment & Plan Note (Signed)
Well controlled, no changes to meds. Encouraged heart healthy diet such as the DASH diet and exercise as tolerated.  °

## 2017-03-28 NOTE — Assessment & Plan Note (Signed)
Encouraged heart healthy diet, increase exercise, avoid trans fats, consider a krill oil cap daily 

## 2017-03-28 NOTE — Assessment & Plan Note (Signed)
On Levothyroxine, continue to monitor 

## 2017-03-28 NOTE — Patient Instructions (Addendum)
Consider Lidocaine patches or gel, for hip and neck pain    Tammy Grimes , Thank you for taking time to come for your Medicare Wellness Visit. I appreciate your ongoing commitment to your health goals. Please review the following plan we discussed and let me know if I can assist you in the future.   These are the goals we discussed: Goals      Patient Stated   . Begin exercising again at least 2x/week (pt-stated)      Other   . Weight (lb) < 130 lb (59 kg)       This is a list of the screening recommended for you and due dates:  Health Maintenance  Topic Date Due  .  Hepatitis C: One time screening is recommended by Center for Disease Control  (CDC) for  adults born from [birth year] through 1963.   Mar 21, 1946  . Colon Cancer Screening  09/19/2016  . Flu Shot  06/08/2017  . Mammogram  06/30/2017  . Tetanus Vaccine  01/29/2026  . DEXA scan (bone density measurement)  Completed  . Pneumonia vaccines  Completed   Continue to eat heart healthy diet (full of fruits, vegetables, whole grains, lean protein, water--limit salt, fat, and sugar intake) and increase physical activity as tolerated.  Continue doing brain stimulating activities (puzzles, reading, adult coloring books, staying active) to keep memory sharp.   Health Maintenance for Postmenopausal Women Menopause is a normal process in which your reproductive ability comes to an end. This process happens gradually over a span of months to years, usually between the ages of 75 and 53. Menopause is complete when you have missed 12 consecutive menstrual periods. It is important to talk with your health care provider about some of the most common conditions that affect postmenopausal women, such as heart disease, cancer, and bone loss (osteoporosis). Adopting a healthy lifestyle and getting preventive care can help to promote your health and wellness. Those actions can also lower your chances of developing some of these common conditions. What  should I know about menopause? During menopause, you may experience a number of symptoms, such as:  Moderate-to-severe hot flashes.  Night sweats.  Decrease in sex drive.  Mood swings.  Headaches.  Tiredness.  Irritability.  Memory problems.  Insomnia. Choosing to treat or not to treat menopausal changes is an individual decision that you make with your health care provider. What should I know about hormone replacement therapy and supplements? Hormone therapy products are effective for treating symptoms that are associated with menopause, such as hot flashes and night sweats. Hormone replacement carries certain risks, especially as you become older. If you are thinking about using estrogen or estrogen with progestin treatments, discuss the benefits and risks with your health care provider. What should I know about heart disease and stroke? Heart disease, heart attack, and stroke become more likely as you age. This may be due, in part, to the hormonal changes that your body experiences during menopause. These can affect how your body processes dietary fats, triglycerides, and cholesterol. Heart attack and stroke are both medical emergencies. There are many things that you can do to help prevent heart disease and stroke:  Have your blood pressure checked at least every 1-2 years. High blood pressure causes heart disease and increases the risk of stroke.  If you are 13-11 years old, ask your health care provider if you should take aspirin to prevent a heart attack or a stroke.  Do not use any tobacco  products, including cigarettes, chewing tobacco, or electronic cigarettes. If you need help quitting, ask your health care provider.  It is important to eat a healthy diet and maintain a healthy weight.  Be sure to include plenty of vegetables, fruits, low-fat dairy products, and lean protein.  Avoid eating foods that are high in solid fats, added sugars, or salt (sodium).  Get  regular exercise. This is one of the most important things that you can do for your health.  Try to exercise for at least 150 minutes each week. The type of exercise that you do should increase your heart rate and make you sweat. This is known as moderate-intensity exercise.  Try to do strengthening exercises at least twice each week. Do these in addition to the moderate-intensity exercise.  Know your numbers.Ask your health care provider to check your cholesterol and your blood glucose. Continue to have your blood tested as directed by your health care provider. What should I know about cancer screening? There are several types of cancer. Take the following steps to reduce your risk and to catch any cancer development as early as possible. Breast Cancer  Practice breast self-awareness.  This means understanding how your breasts normally appear and feel.  It also means doing regular breast self-exams. Let your health care provider know about any changes, no matter how small.  If you are 62 or older, have a clinician do a breast exam (clinical breast exam or CBE) every year. Depending on your age, family history, and medical history, it may be recommended that you also have a yearly breast X-ray (mammogram).  If you have a family history of breast cancer, talk with your health care provider about genetic screening.  If you are at high risk for breast cancer, talk with your health care provider about having an MRI and a mammogram every year.  Breast cancer (BRCA) gene test is recommended for women who have family members with BRCA-related cancers. Results of the assessment will determine the need for genetic counseling and BRCA1 and for BRCA2 testing. BRCA-related cancers include these types:  Breast. This occurs in males or females.  Ovarian.  Tubal. This may also be called fallopian tube cancer.  Cancer of the abdominal or pelvic lining (peritoneal  cancer).  Prostate.  Pancreatic. Cervical, Uterine, and Ovarian Cancer  Your health care provider may recommend that you be screened regularly for cancer of the pelvic organs. These include your ovaries, uterus, and vagina. This screening involves a pelvic exam, which includes checking for microscopic changes to the surface of your cervix (Pap test).  For women ages 21-65, health care providers may recommend a pelvic exam and a Pap test every three years. For women ages 78-65, they may recommend the Pap test and pelvic exam, combined with testing for human papilloma virus (HPV), every five years. Some types of HPV increase your risk of cervical cancer. Testing for HPV may also be done on women of any age who have unclear Pap test results.  Other health care providers may not recommend any screening for nonpregnant women who are considered low risk for pelvic cancer and have no symptoms. Ask your health care provider if a screening pelvic exam is right for you.  If you have had past treatment for cervical cancer or a condition that could lead to cancer, you need Pap tests and screening for cancer for at least 20 years after your treatment. If Pap tests have been discontinued for you, your risk factors (such  as having a new sexual partner) need to be reassessed to determine if you should start having screenings again. Some women have medical problems that increase the chance of getting cervical cancer. In these cases, your health care provider may recommend that you have screening and Pap tests more often.  If you have a family history of uterine cancer or ovarian cancer, talk with your health care provider about genetic screening.  If you have vaginal bleeding after reaching menopause, tell your health care provider.  There are currently no reliable tests available to screen for ovarian cancer. Lung Cancer  Lung cancer screening is recommended for adults 93-27 years old who are at high risk for  lung cancer because of a history of smoking. A yearly low-dose CT scan of the lungs is recommended if you:  Currently smoke.  Have a history of at least 30 pack-years of smoking and you currently smoke or have quit within the past 15 years. A pack-year is smoking an average of one pack of cigarettes per day for one year. Yearly screening should:  Continue until it has been 15 years since you quit.  Stop if you develop a health problem that would prevent you from having lung cancer treatment. Colorectal Cancer  This type of cancer can be detected and can often be prevented.  Routine colorectal cancer screening usually begins at age 24 and continues through age 48.  If you have risk factors for colon cancer, your health care provider may recommend that you be screened at an earlier age.  If you have a family history of colorectal cancer, talk with your health care provider about genetic screening.  Your health care provider may also recommend using home test kits to check for hidden blood in your stool.  A small camera at the end of a tube can be used to examine your colon directly (sigmoidoscopy or colonoscopy). This is done to check for the earliest forms of colorectal cancer.  Direct examination of the colon should be repeated every 5-10 years until age 58. However, if early forms of precancerous polyps or small growths are found or if you have a family history or genetic risk for colorectal cancer, you may need to be screened more often. Skin Cancer  Check your skin from head to toe regularly.  Monitor any moles. Be sure to tell your health care provider:  About any new moles or changes in moles, especially if there is a change in a mole's shape or color.  If you have a mole that is larger than the size of a pencil eraser.  If any of your family members has a history of skin cancer, especially at a young age, talk with your health care provider about genetic screening.  Always  use sunscreen. Apply sunscreen liberally and repeatedly throughout the day.  Whenever you are outside, protect yourself by wearing long sleeves, pants, a wide-brimmed hat, and sunglasses. What should I know about osteoporosis? Osteoporosis is a condition in which bone destruction happens more quickly than new bone creation. After menopause, you may be at an increased risk for osteoporosis. To help prevent osteoporosis or the bone fractures that can happen because of osteoporosis, the following is recommended:  If you are 83-70 years old, get at least 1,000 mg of calcium and at least 600 mg of vitamin D per day.  If you are older than age 14 but younger than age 67, get at least 1,200 mg of calcium and at least 600  mg of vitamin D per day.  If you are older than age 21, get at least 1,200 mg of calcium and at least 800 mg of vitamin D per day. Smoking and excessive alcohol intake increase the risk of osteoporosis. Eat foods that are rich in calcium and vitamin D, and do weight-bearing exercises several times each week as directed by your health care provider. What should I know about how menopause affects my mental health? Depression may occur at any age, but it is more common as you become older. Common symptoms of depression include:  Low or sad mood.  Changes in sleep patterns.  Changes in appetite or eating patterns.  Feeling an overall lack of motivation or enjoyment of activities that you previously enjoyed.  Frequent crying spells. Talk with your health care provider if you think that you are experiencing depression. What should I know about immunizations? It is important that you get and maintain your immunizations. These include:  Tetanus, diphtheria, and pertussis (Tdap) booster vaccine.  Influenza every year before the flu season begins.  Pneumonia vaccine.  Shingles vaccine. Your health care provider may also recommend other immunizations. This information is not  intended to replace advice given to you by your health care provider. Make sure you discuss any questions you have with your health care provider. Document Released: 12/17/2005 Document Revised: 05/14/2016 Document Reviewed: 07/29/2015 Elsevier Interactive Patient Education  2017 [employer].

## 2017-03-28 NOTE — Assessment & Plan Note (Signed)
Daily supplements, check level today

## 2017-03-28 NOTE — Progress Notes (Signed)
Patient ID: Tammy Grimes, female   DOB: 04-19-1946, 71 y.o.   MRN: [id number]   Subjective:  I acted as a Education administrator for Penni Homans, [place], [place]   Patient ID: Tammy Grimes, female    DOB: [birth year], 71 y.o.   MRN: [id number]  Chief Complaint  Patient presents with  . Medicare Wellness    with RN  . Follow-up    with Provider    Back Pain  This is a chronic problem. The problem occurs intermittently. The problem is unchanged. Pertinent negatives include no chest pain, fever or headaches.    Patient is in today for an Annual Wellness Visit with the Health Coach to be follow up with the Provider. Patient is still experiencing ongoing back pain. Patient has a Hx of HTN, GERD, chronic back pain, anemia, hyperlipidemia,osteopenia, DDD. Patient has no additional acute concerns noted at this time. No recent febrile illness or hospitalizations. Has some pain into right hip. No falls or trauma. Is trying to maintain a heart healthy diet and stay active. Denies CP/palp/SOB/HA/congestion/fevers/GI or GU c/o. Taking meds as prescribed  Patient Care Team: [name], MD as PCP - General (Family Medicine) Martinique, Amy, MD as Consulting Physician (Dermatology) Rutherford Guys, MD as Consulting Physician (Ophthalmology)   Past Medical History:  Diagnosis Date  . Allergy   . Anemia    prior to hysterectomy  . BCC (basal cell carcinoma) 08/01/2015  . Cancer ([facility]) 2007   calf of right leg- melanoma  . Cervical cancer screening 06/06/2012  . DDD (degenerative disc disease), cervical 08/01/2015  . GERD (gastroesophageal reflux disease)   . Hyperglycemia 03/28/2017  . Hyperlipidemia   . Hypertension 65  . Knee pain 03/30/2012   L>R  . Medicare annual wellness visit, subsequent 02/09/2015  . Melanoma of skin ([facility]) 08/19/2016  . Preventative health care 03/30/2012  . Thyroid disease 64  . Vitamin D deficiency 06/06/2012    Past Surgical History:  Procedure Laterality Date  . ABDOMINAL  HYSTERECTOMY  1990's   total, for heavy bleeding and fibroids  . BREAST SURGERY  early 70's   fibroid tumors removed- benign  . CATARACT EXTRACTION Bilateral 12/09/12  . EYE SURGERY Bilateral 2015   cataracts    Family History  Problem Relation Age of Onset  . Hypertension Mother   . Alzheimer's disease Mother   . Dementia Mother        alzheimer's  . Aortic aneurysm Father   . Hypertension Father   . COPD Father        smoker  . Heart disease Father        s/p bypass, aortic aneurysm, carotid artery disease  . Cancer Sister 74       breast- remission  . Diabetes Son 22       type 1  . Alzheimer's disease Maternal Grandmother   . Cancer Maternal Grandfather        prostate  . Stroke Paternal Grandfather     Social History   Social History  . Marital status: Married    Spouse name: N/A  . Number of children: N/A  . Years of education: N/A   Occupational History  . Not on file.   Social History Main Topics  . Smoking status: Never Smoker  . Smokeless tobacco: Never Used  . Alcohol use No  . Drug use: No  . Sexual activity: Yes     Comment: lives with husband, retired from [employer] work,  no dietary restrictions   Other Topics Concern  . Not on file   Social History Narrative  . No narrative on file    Outpatient Medications Prior to Visit  Medication Sig Dispense Refill  . amLODipine (NORVASC) 5 MG tablet TAKE 1 TABLET(5 MG) BY MOUTH DAILY 90 tablet 2  . aspirin 81 MG tablet Take 81 mg by mouth daily.    . calcium-vitamin D 250-100 MG-UNIT per tablet Take 1 tablet by mouth daily.    . Cholecalciferol (VITAMIN D3) 5000 UNITS CAPS Take 5,000 Units by mouth daily.    Marland Kitchen KRILL OIL PO Take 1 capsule by mouth daily.    Marland Kitchen omeprazole (PRILOSEC) 20 MG capsule Take 1 capsule (20 mg total) by mouth daily. 90 capsule 2  . ranitidine (ZANTAC) 300 MG tablet Take 1 tablet (300 mg total) by mouth at bedtime. 90 tablet 1  . Red Yeast Rice Extract (RED YEAST RICE PO)  Take 1 capsule by mouth daily.     Marland Kitchen SYNTHROID 75 MCG tablet Take 1 tablet (75 mcg total) by mouth daily before breakfast. 90 tablet 2  . naproxen (NAPROSYN) 500 MG tablet Take 1 tablet (500 mg total) by mouth 2 (two) times daily with a meal. (Patient not taking: Reported on 08/19/2016) 30 tablet 0   No facility-administered medications prior to visit.     Allergies  Allergen Reactions  . Penicillins Rash    Review of Systems  Constitutional: Negative for fever and malaise/fatigue.  HENT: Negative for congestion.   Eyes: Negative for blurred vision.  Respiratory: Positive for shortness of breath. Negative for cough.   Cardiovascular: Negative for chest pain, palpitations and leg swelling.  Gastrointestinal: Negative for vomiting.  Genitourinary: Negative for flank pain.  Musculoskeletal: Positive for back pain and joint pain.  Skin: Negative for rash.  Neurological: Negative for loss of consciousness and headaches.       Objective:    Physical Exam  Constitutional: She is oriented to person, place, and time. She appears well-developed and well-nourished. No distress.  HENT:  Head: Normocephalic and atraumatic.  Eyes: Conjunctivae are normal.  Neck: Normal range of motion. No thyromegaly present.  Cardiovascular: Normal rate and regular rhythm.   Pulmonary/Chest: Effort normal and breath sounds normal. She has no wheezes.  Abdominal: Soft. Bowel sounds are normal. There is no tenderness.  Musculoskeletal: She exhibits no edema or deformity.  Neurological: She is alert and oriented to person, place, and time.  Skin: Skin is warm and dry. She is not diaphoretic.  Psychiatric: She has a normal mood and affect.    BP 140/80 (BP Location: Right Arm, Patient Position: Sitting, Cuff Size: Normal)   Pulse 83   Ht 5\' 2"  (1.575 m)   Wt 138 lb 3.2 oz (62.7 kg)   SpO2 97%   BMI 25.28 kg/m  Wt Readings from Last 3 Encounters:  03/28/17 138 lb 3.2 oz (62.7 kg)  08/19/16 133 lb 4  oz (60.4 kg)  04/06/16 139 lb 8 oz (63.3 kg)   BP Readings from Last 3 Encounters:  03/28/17 140/80  08/25/16 130/76  04/06/16 128/82     Immunization History  Administered Date(s) Administered  . Influenza Split 08/31/2012  . Influenza Whole 08/09/2011  . Influenza, High Dose Seasonal PF 08/19/2016  . Influenza,inj,Quad PF,36+ Mos 07/24/2013, 08/01/2015  . Influenza-Unspecified 07/24/2014  . Pneumococcal Conjugate-13 07/24/2013  . Pneumococcal Polysaccharide-23 08/01/2015  . Td 11/08/2005  . Tdap 01/30/2016  . Zoster 06/07/2012  Health Maintenance  Topic Date Due  . Hepatitis C Screening  24-Sep-1946  . COLONOSCOPY  09/19/2016  . INFLUENZA VACCINE  06/08/2017  . MAMMOGRAM  06/30/2017  . TETANUS/TDAP  01/29/2026  . DEXA SCAN  Completed  . PNA vac Low Risk Adult  Completed    Lab Results  Component Value Date   WBC 7.3 03/28/2017   HGB 13.8 03/28/2017   HCT 42.0 03/28/2017   PLT 218.0 03/28/2017   GLUCOSE 114 (H) 03/28/2017   CHOL 239 (H) 03/28/2017   TRIG 177.0 (H) 03/28/2017   HDL 45.40 03/28/2017   LDLDIRECT 152.0 08/01/2015   LDLCALC 158 (H) 03/28/2017   ALT 22 03/28/2017   AST 20 03/28/2017   NA 141 03/28/2017   K 4.7 03/28/2017   CL 105 03/28/2017   CREATININE 0.64 03/28/2017   BUN 13 03/28/2017   CO2 27 03/28/2017   TSH 2.47 03/28/2017   HGBA1C 5.9 03/28/2017    Lab Results  Component Value Date   TSH 2.47 03/28/2017   Lab Results  Component Value Date   WBC 7.3 03/28/2017   HGB 13.8 03/28/2017   HCT 42.0 03/28/2017   MCV 91.5 03/28/2017   PLT 218.0 03/28/2017   Lab Results  Component Value Date   NA 141 03/28/2017   K 4.7 03/28/2017   CO2 27 03/28/2017   GLUCOSE 114 (H) 03/28/2017   BUN 13 03/28/2017   CREATININE 0.64 03/28/2017   BILITOT 0.5 03/28/2017   ALKPHOS 73 03/28/2017   AST 20 03/28/2017   ALT 22 03/28/2017   PROT 7.6 03/28/2017   ALBUMIN 5.0 03/28/2017   CALCIUM 9.8 03/28/2017   GFR 97.69 03/28/2017   Lab Results    Component Value Date   CHOL 239 (H) 03/28/2017   Lab Results  Component Value Date   HDL 45.40 03/28/2017   Lab Results  Component Value Date   LDLCALC 158 (H) 03/28/2017   Lab Results  Component Value Date   TRIG 177.0 (H) 03/28/2017   Lab Results  Component Value Date   CHOLHDL 5 03/28/2017   Lab Results  Component Value Date   HGBA1C 5.9 03/28/2017         Assessment & Plan:   Problem List Items Addressed This Visit    Thyroid disease    On Levothyroxine, continue to monitor      Relevant Orders   Ambulatory referral to Cardiology   Hypertension    Well controlled, no changes to meds. Encouraged heart healthy diet such as the DASH diet and exercise as tolerated.       Relevant Orders   CBC (Completed)   Comprehensive metabolic panel (Completed)   TSH (Completed)   CBC   Comprehensive metabolic panel   TSH   Ambulatory referral to Cardiology   Hyperlipidemia    Encouraged heart healthy diet, increase exercise, avoid trans fats, consider a krill oil cap daily      Relevant Orders   Lipid panel (Completed)   Lipid panel   Ambulatory referral to Cardiology   Osteopenia    Encouraged to get adequate exercise, calcium and vitamin d intake      Vitamin D deficiency    Daily supplements, check level today      Relevant Orders   VITAMIN D 25 Hydroxy (Vit-D Deficiency, Fractures) (Completed)   VITAMIN D 25 Hydroxy (Vit-D Deficiency, Fractures)   Hyperglycemia    hgba1c acceptable, minimize simple carbs. Increase exercise as tolerated.  Relevant Orders   Hemoglobin A1c (Completed)   Hemoglobin A1c   Ambulatory referral to Cardiology   SOB (shortness of breath)    With some jaw pain while exerting herself while walking up stairs. Also some EKG changes noted will refer to cardiology.       Relevant Orders   EKG 12-Lead (Completed)   Ambulatory referral to Cardiology    Other Visit Diagnoses    Encounter for Medicare annual wellness exam     -  Primary   Other problems related to lifestyle       Relevant Orders   Hepatitis C Antibody      I am having Ms. Xu maintain her aspirin, KRILL OIL PO, calcium-vitamin D, Red Yeast Rice Extract (RED YEAST RICE PO), ranitidine, Vitamin D3, naproxen, amLODipine, SYNTHROID, and omeprazole.  No orders of the defined types were placed in this encounter.   CMA served as Education administrator during this visit. History, Physical and Plan performed by medical provider. Documentation and orders reviewed and attested to.  Penni Homans, MD

## 2017-03-28 NOTE — Assessment & Plan Note (Signed)
With some jaw pain while exerting herself while walking up stairs. Also some EKG changes noted will refer to cardiology.

## 2017-03-29 LAB — HEPATITIS C ANTIBODY: HCV Ab: NEGATIVE

## 2017-03-30 DIAGNOSIS — M5432 Sciatica, left side: Secondary | ICD-10-CM | POA: Diagnosis not present

## 2017-03-30 DIAGNOSIS — M9903 Segmental and somatic dysfunction of lumbar region: Secondary | ICD-10-CM | POA: Diagnosis not present

## 2017-04-28 DIAGNOSIS — M5432 Sciatica, left side: Secondary | ICD-10-CM | POA: Diagnosis not present

## 2017-04-28 DIAGNOSIS — M9903 Segmental and somatic dysfunction of lumbar region: Secondary | ICD-10-CM | POA: Diagnosis not present

## 2017-05-05 ENCOUNTER — Encounter: Payer: Self-pay | Admitting: Internal Medicine

## 2017-05-05 ENCOUNTER — Ambulatory Visit (INDEPENDENT_AMBULATORY_CARE_PROVIDER_SITE_OTHER): Payer: Medicare Other | Admitting: Internal Medicine

## 2017-05-05 VITALS — BP 132/82 | HR 84 | Ht 62.0 in | Wt 139.0 lb

## 2017-05-05 DIAGNOSIS — R0602 Shortness of breath: Secondary | ICD-10-CM

## 2017-05-05 DIAGNOSIS — I10 Essential (primary) hypertension: Secondary | ICD-10-CM | POA: Diagnosis not present

## 2017-05-05 DIAGNOSIS — E7800 Pure hypercholesterolemia, unspecified: Secondary | ICD-10-CM | POA: Diagnosis not present

## 2017-05-05 DIAGNOSIS — R0789 Other chest pain: Secondary | ICD-10-CM

## 2017-05-05 NOTE — Patient Instructions (Signed)
Medication Instructions:  Your physician recommends that you continue on your current medications as directed. Please refer to the Current Medication list given to you today.   Labwork: none  Testing/Procedures: Your physician has requested that you have an exercise stress myoview. For further information please visit [email or web address]. Please follow instruction sheet, as given.  Follow-Up: Your physician recommends that you schedule a follow-up appointment in: about one month with Dr. Saunders Revel    Any Other Special Instructions Will Be Listed Below (If Applicable).     If you need a refill on your cardiac medications before your next appointment, please call your pharmacy.

## 2017-05-05 NOTE — Progress Notes (Signed)
New Outpatient Visit Date: 05/05/2017  Referring Provider: [name], MD [address] [place], [place] [address]  Chief Complaint: Shortness of breath and neck pain  HPI:  Ms. Chea is a 71 y.o. female who is being seen today for the evaluation of shortness of breath and neck pain concerning for anginal equivalent at the request of Dr. Charlett Blake. She has a history of hypertension, hyperlipidemia, melanoma, and thyroid disease. Last month, Ms. Memmer was visiting [place], where she did a lot of walking. While going up a flight of stairs, she became fatigued and short of breath and felt as though her legs were giving out. She also noted a sharp pain along the left side of her neck that lasted for a few minutes. She sat down to rest with resolution of these symptoms. She has not had any recurrence of the neck pain nor has she experienced any chest pain. She has had different pain in her neck and shoulder for which she sees a Restaurant manager, fast food. Her exertional dyspnea has been relatively stable for more than one to 2 years. She notes occasional dependent leg edema but has not had any orthopnea or PND. She also denies lightheadedness and palpitations.  Ms. Hurtubise does not have a history of cardiovascular disease, nor has she undergone previous cardiac testing. When she was recently evaluated by Dr. Charlett Blake, EKG was abnormal with possible inferior Q waves. She was recently prescribed a statin by Dr. Charlett Blake, but has yet to begin this. She is concerned about myalgias, given that she already has muscle aches and pains on a regular basis.  --------------------------------------------------------------------------------------------------  Cardiovascular History & Procedures: Cardiovascular Problems:  Dyspnea on exertion  Abnormal EKG  Risk Factors:  Hypertension, hyperlipidemia, and age greater than 18  Cath/PCI:  None  CV Surgery:  None  EP Procedures and  Devices:  None  Non-Invasive Evaluation(s):  None  Recent CV Pertinent Labs: Lab Results  Component Value Date   CHOL 239 (H) 03/28/2017   HDL 45.40 03/28/2017   LDLCALC 158 (H) 03/28/2017   LDLDIRECT 152.0 08/01/2015   TRIG 177.0 (H) 03/28/2017   CHOLHDL 5 03/28/2017   K 4.7 03/28/2017   BUN 13 03/28/2017   CREATININE 0.64 03/28/2017   CREATININE 0.56 07/24/2013    --------------------------------------------------------------------------------------------------  Past Medical History:  Diagnosis Date  . Allergy   . Anemia    prior to hysterectomy  . BCC (basal cell carcinoma) 08/01/2015  . Cancer ([facility]) 2007   calf of right leg- melanoma  . Cervical cancer screening 06/06/2012  . DDD (degenerative disc disease), cervical 08/01/2015  . GERD (gastroesophageal reflux disease)   . Hyperglycemia 03/28/2017  . Hyperlipidemia   . Hypertension 65  . Knee pain 03/30/2012   L>R  . Medicare annual wellness visit, subsequent 02/09/2015  . Melanoma of skin ([facility]) 08/19/2016  . Preventative health care 03/30/2012  . Thyroid disease 64  . Vitamin D deficiency 06/06/2012    Past Surgical History:  Procedure Laterality Date  . ABDOMINAL HYSTERECTOMY  1990's   total, for heavy bleeding and fibroids  . BREAST SURGERY  early 70's   fibroid tumors removed- benign  . CATARACT EXTRACTION Bilateral 12/09/12  . EYE SURGERY Bilateral 2015   cataracts    Current Meds  Medication Sig  . amLODipine (NORVASC) 5 MG tablet TAKE 1 TABLET(5 MG) BY MOUTH DAILY  . aspirin 81 MG tablet Take 81 mg by mouth daily.  Raelyn Ensign Pollen 550 MG CAPS  Take by mouth daily.  . calcium-vitamin D 250-100 MG-UNIT per tablet Take 1 tablet by mouth daily.  . Cholecalciferol (VITAMIN D3) 5000 UNITS CAPS Take 5,000 Units by mouth daily.  Marland Kitchen KRILL OIL PO Take 1 capsule by mouth daily.  Marland Kitchen omeprazole (PRILOSEC) 20 MG capsule Take 1 capsule (20 mg total) by mouth daily.  . ranitidine (ZANTAC) 300 MG tablet Take 1 tablet (300  mg total) by mouth at bedtime.  . Red Yeast Rice Extract (RED YEAST RICE PO) Take 1 capsule by mouth daily.   Marland Kitchen SYNTHROID 75 MCG tablet Take 1 tablet (75 mcg total) by mouth daily before breakfast.    Allergies: Penicillins  Social History   Social History  . Marital status: Married    Spouse name: N/A  . Number of children: N/A  . Years of education: N/A   Occupational History  . Not on file.   Social History Main Topics  . Smoking status: Never Smoker  . Smokeless tobacco: Never Used  . Alcohol use No  . Drug use: No  . Sexual activity: Yes     Comment: lives with husband, retired from [employer] work, no dietary restrictions   Other Topics Concern  . Not on file   Social History Narrative  . No narrative on file    Family History  Problem Relation Age of Onset  . Hypertension Mother   . Alzheimer's disease Mother   . Dementia Mother        alzheimer's  . Aortic aneurysm Father   . Hypertension Father   . COPD Father        smoker  . Heart disease Father        s/p bypass, aortic aneurysm, carotid artery disease  . Cancer Sister 71       breast- remission  . Diabetes Son 22       type 1  . Alzheimer's disease Maternal Grandmother   . Cancer Maternal Grandfather        prostate  . Stroke Paternal Grandfather     Review of Systems: A 12-system review of systems was performed and was negative except as noted in the HPI.  --------------------------------------------------------------------------------------------------  Physical Exam: BP 132/82   Pulse 84   Ht 5\' 2"  (1.575 m)   Wt 63 kg (139 lb)   BMI 25.42 kg/m   General:  Well-developed, well-nourished woman, seated comfortably in the exam room. HEENT: No conjunctival pallor or scleral icterus. Moist mucous membranes. OP clear. Neck: Supple without lymphadenopathy, thyromegaly, JVD, or HJR. No carotid bruit. Lungs: Normal work of breathing. Clear to auscultation bilaterally without wheezes  or crackles. Heart: Regular rate and rhythm without murmurs, rubs, or gallops. Non-displaced PMI. Abd: Bowel sounds present. Soft, NT/ND without hepatosplenomegaly Ext: No lower extremity edema. Radial, PT, and DP pulses are 2+ bilaterally Skin: Warm and dry without rash. Neuro: CNIII-XII intact. Strength and fine-touch sensation intact in upper and lower extremities bilaterally. Psych: Normal mood and affect.  EKG:  Normal sinus rhythm with poor R-wave progression that could reflect lead placement or prior anterior infarct. Compared with prior tracing from 03/28/17, inferior MIs no longer present.  Lab Results  Component Value Date   WBC 7.3 03/28/2017   HGB 13.8 03/28/2017   HCT 42.0 03/28/2017   MCV 91.5 03/28/2017   PLT 218.0 03/28/2017    Lab Results  Component Value Date   NA 141 03/28/2017   K 4.7 03/28/2017   CL 105 03/28/2017  CO2 27 03/28/2017   BUN 13 03/28/2017   CREATININE 0.64 03/28/2017   GLUCOSE 114 (H) 03/28/2017   ALT 22 03/28/2017    Lab Results  Component Value Date   CHOL 239 (H) 03/28/2017   HDL 45.40 03/28/2017   LDLCALC 158 (H) 03/28/2017   LDLDIRECT 152.0 08/01/2015   TRIG 177.0 (H) 03/28/2017   CHOLHDL 5 03/28/2017    --------------------------------------------------------------------------------------------------  ASSESSMENT AND PLAN: Neck pain (question atypical chest pain/anginal equivalent) and dyspnea on exertion Ms. Novack has chronic exertional dyspnea but experienced an episode of left-sided neck pain with activity last month. She has not had a recurrence. EKG in Dr. Frederik Pear office last month was concerning for possible prior inferior MI. However, that is not evident on today's tracing. Poor R-wave progression is noted, however. In light of her EKG changes and aforementioned symptoms and risk factors, we have agreed to proceed with noninvasive ischemia evaluation. We will obtain an exercise myocardial perfusion stress test. If this is  normal, I think it is reasonable to defer further cardiovascular workup and to continue with primary prevention.  Hypertension Blood pressure is upper normal to borderline elevated today. I will defer medication changes to Dr. Maryellen Pile.  Hyperlipidemia LDL last month was elevated at 158. We discussed lifestyle modifications. I think it would be reasonable to begin a statin. However, we have agreed to defer this until after completion of the aforementioned stress test.  Follow-up: Return to clinic in 1 month.  Nelva Bush, MD 05/05/2017 1:57 PM

## 2017-05-07 DIAGNOSIS — E7800 Pure hypercholesterolemia, unspecified: Secondary | ICD-10-CM | POA: Insufficient documentation

## 2017-05-07 DIAGNOSIS — R0789 Other chest pain: Secondary | ICD-10-CM | POA: Insufficient documentation

## 2017-05-10 ENCOUNTER — Telehealth (HOSPITAL_COMMUNITY): Payer: Self-pay | Admitting: *Deleted

## 2017-05-10 NOTE — Telephone Encounter (Signed)
Patient given detailed instructions per Myocardial Perfusion Study Information Sheet for the test on 05/16/17. Patient notified to arrive 15 minutes early and that it is imperative to arrive on time for appointment to keep from having the test rescheduled.  If you need to cancel or reschedule your appointment, please call the office within 24 hours of your appointment. . Patient verbalized understanding. Kirstie Peri

## 2017-05-16 ENCOUNTER — Ambulatory Visit (HOSPITAL_COMMUNITY): Payer: Medicare Other | Attending: Cardiovascular Disease

## 2017-05-16 DIAGNOSIS — R0789 Other chest pain: Secondary | ICD-10-CM

## 2017-05-16 DIAGNOSIS — R0602 Shortness of breath: Secondary | ICD-10-CM | POA: Diagnosis not present

## 2017-05-16 LAB — MYOCARDIAL PERFUSION IMAGING
CHL CUP MPHR: 151 {beats}/min
CHL CUP NUCLEAR SDS: 2
CHL CUP NUCLEAR SRS: 13
CHL CUP NUCLEAR SSS: 15
CSEPED: 3 min
CSEPEW: 4.8 METS
CSEPHR: 95 %
CSEPPHR: 144 {beats}/min
Exercise duration (sec): 30 s
LHR: 0.32
LV dias vol: 41 mL (ref 46–106)
LV sys vol: 11 mL
RPE: 19
Rest HR: 63 {beats}/min
TID: 1.08

## 2017-05-16 MED ORDER — TECHNETIUM TC 99M TETROFOSMIN IV KIT
10.7000 | PACK | Freq: Once | INTRAVENOUS | Status: AC | PRN
  Administered 2017-05-16: 10.7 via INTRAVENOUS
  Filled 2017-05-16: qty 11

## 2017-05-16 MED ORDER — TECHNETIUM TC 99M TETROFOSMIN IV KIT
32.7000 | PACK | Freq: Once | INTRAVENOUS | Status: AC | PRN
  Administered 2017-05-16: 32.7 via INTRAVENOUS
  Filled 2017-05-16: qty 33

## 2017-05-18 ENCOUNTER — Telehealth: Payer: Self-pay | Admitting: *Deleted

## 2017-05-18 MED ORDER — METOPROLOL TARTRATE 25 MG PO TABS: ORAL_TABLET | ORAL | 3 refills | Status: DC

## 2017-05-18 NOTE — Telephone Encounter (Signed)
Notes recorded by Nelva Bush, MD on 05/16/2017 at 9:21 PM EDT Please let Tammy Grimes know that her stress test shows a small area of abnormality, which could represent an artifact or small area of blockage. Overall, the study was low risk. I suggest that we start metoprolol tartrate 12.5 mg BID and reassess her symptoms when she returns for follow-up on 7/27.

## 2017-05-23 ENCOUNTER — Encounter: Payer: Self-pay | Admitting: *Deleted

## 2017-05-24 ENCOUNTER — Ambulatory Visit (AMBULATORY_SURGERY_CENTER): Payer: Self-pay

## 2017-05-24 VITALS — Ht 62.0 in | Wt 140.2 lb

## 2017-05-24 DIAGNOSIS — Z1211 Encounter for screening for malignant neoplasm of colon: Secondary | ICD-10-CM

## 2017-05-24 NOTE — Progress Notes (Signed)
Denies allergies to eggs or soy products. Denies complication of anesthesia or sedation. Denies use of weight loss medication. Denies use of O2.   Emmi instructions declined.  

## 2017-05-26 DIAGNOSIS — M9903 Segmental and somatic dysfunction of lumbar region: Secondary | ICD-10-CM | POA: Diagnosis not present

## 2017-05-26 DIAGNOSIS — M5432 Sciatica, left side: Secondary | ICD-10-CM | POA: Diagnosis not present

## 2017-06-01 DIAGNOSIS — M5432 Sciatica, left side: Secondary | ICD-10-CM | POA: Diagnosis not present

## 2017-06-01 DIAGNOSIS — M9903 Segmental and somatic dysfunction of lumbar region: Secondary | ICD-10-CM | POA: Diagnosis not present

## 2017-06-03 ENCOUNTER — Encounter: Payer: Self-pay | Admitting: Internal Medicine

## 2017-06-03 ENCOUNTER — Ambulatory Visit (INDEPENDENT_AMBULATORY_CARE_PROVIDER_SITE_OTHER): Payer: Medicare Other | Admitting: Internal Medicine

## 2017-06-03 VITALS — BP 126/72 | HR 70 | Ht 62.0 in | Wt 138.0 lb

## 2017-06-03 DIAGNOSIS — I10 Essential (primary) hypertension: Secondary | ICD-10-CM | POA: Diagnosis not present

## 2017-06-03 DIAGNOSIS — E782 Mixed hyperlipidemia: Secondary | ICD-10-CM | POA: Diagnosis not present

## 2017-06-03 DIAGNOSIS — R0789 Other chest pain: Secondary | ICD-10-CM

## 2017-06-03 MED ORDER — METOPROLOL TARTRATE 25 MG PO TABS: ORAL_TABLET | ORAL | 3 refills | Status: DC

## 2017-06-03 MED ORDER — ROSUVASTATIN CALCIUM 5 MG PO TABS: 5.0000 mg | ORAL_TABLET | Freq: Every day | ORAL | 1 refills | Status: DC

## 2017-06-03 NOTE — Progress Notes (Signed)
Follow-up Outpatient Visit Date: 06/03/2017  Primary Care Provider: [name], MD [address] [place] [address]  Chief Complaint: Follow-up chest and neck pain  HPI:  Ms. Hellwig is a 71 y.o. year-old female with history of hypertension, hyperlipidemia, melanoma, and thyroid disease, who presents for follow-up of chest and neck pain. I last saw her in 05/05/17, at which time she reported an episode of chest and neck pain accompanied by fatigue while walking up steps at the more [place]. The symptoms resolved with rest and had not recurred. However, given her multiple cardiac risk factors, we agreed to perform a myocardial perfusion stress test that was low risk with small apical defect most likely representing shifting breast attenuation versus small area of ischemia. We agreed to add low-dose metoprolol, which Ms. Silvernail has been tolerating well. She initially noted a faint sinus headache after starting metoprolol, though this has since resolved. She has not had any further episodes of neck or chest pain. She also denies shortness of breath, palpitations, lightheadedness, and edema. She has stable low back and leg pain.  --------------------------------------------------------------------------------------------------  Cardiovascular History & Procedures: Cardiovascular Problems:  Atypical chest/neck pain  Risk Factors:  Hypertension, hyperlipidemia, and age greater than 65  Cath/PCI:  None  CV Surgery:  None  EP Procedures and Devices:  None  Non-Invasive Evaluation(s):  Exercise myocardial perfusion stress test (05/16/17): Low risk study with small in size, moderate in severity, apical defect noted at stress that may represent shifting breast attenuation versus a small area of ischemia. LVEF 74%. No EKG changes with stress.  Recent CV Pertinent Labs: Lab Results  Component Value Date   CHOL 239 (H) 03/28/2017   HDL 45.40 03/28/2017   LDLCALC 158 (H) 03/28/2017   LDLDIRECT 152.0 08/01/2015   TRIG 177.0 (H) 03/28/2017   CHOLHDL 5 03/28/2017   K 4.7 03/28/2017   BUN 13 03/28/2017   CREATININE 0.64 03/28/2017   CREATININE 0.56 07/24/2013    Past medical and surgical history were reviewed and updated in EPIC.  Current Meds  Medication Sig  . amLODipine (NORVASC) 5 MG tablet TAKE 1 TABLET(5 MG) BY MOUTH DAILY  . aspirin 81 MG tablet Take 81 mg by mouth daily.  . calcium-vitamin D 250-100 MG-UNIT per tablet Take 1 tablet by mouth daily.  . Cholecalciferol (VITAMIN D3) 5000 UNITS CAPS Take 5,000 Units by mouth daily.  Marland Kitchen KRILL OIL PO Take 1 capsule by mouth daily.  . metoprolol tartrate (LOPRESSOR) 25 MG tablet Take 1/2 tablet (12.5mg  ) by mouth two times a day  . omeprazole (PRILOSEC) 20 MG capsule Take 1 capsule (20 mg total) by mouth daily.  . polyethylene glycol powder (GLYCOLAX/MIRALAX) powder Take 1 Container by mouth once.  . ranitidine (ZANTAC) 300 MG tablet Take 1 tablet (300 mg total) by mouth at bedtime.  . Red Yeast Rice Extract (RED YEAST RICE PO) Take 1 capsule by mouth daily.   Marland Kitchen SYNTHROID 75 MCG tablet Take 1 tablet (75 mcg total) by mouth daily before breakfast.    Allergies: Penicillins  Social History   Social History  . Marital status: Married    Spouse name: N/A  . Number of children: N/A  . Years of education: N/A   Occupational History  . Not on file.   Social History Main Topics  . Smoking status: Never Smoker  . Smokeless tobacco: Never Used  . Alcohol use No  . Drug use: No  . Sexual activity:  Yes     Comment: lives with husband, retired from [employer] work, no dietary restrictions   Other Topics Concern  . Not on file   Social History Narrative  . No narrative on file    Family History  Problem Relation Age of Onset  . Hypertension Mother   . Alzheimer's disease Mother   . Dementia Mother        alzheimer's  . Aortic aneurysm Father   . Hypertension Father   .  COPD Father        smoker  . Heart disease Father        s/p bypass, aortic aneurysm, carotid artery disease  . Cancer Sister 81       breast- remission  . Diabetes Son 22       type 1  . Alzheimer's disease Maternal Grandmother   . Cancer Maternal Grandfather        prostate  . Stroke Paternal Grandfather   . Colon cancer Neg Hx   . Esophageal cancer Neg Hx   . Pancreatic cancer Neg Hx   . Rectal cancer Neg Hx   . Stomach cancer Neg Hx     Review of Systems: A 12-system review of systems was performed and was negative except as noted in the HPI.  --------------------------------------------------------------------------------------------------  Physical Exam: BP 126/72   Pulse 70   Ht 5\' 2"  (1.575 m)   Wt 138 lb (62.6 kg)   SpO2 96%   BMI 25.24 kg/m   General:  Well-developed, well-nourished woman, seated comfortably in the exam room. HEENT: No conjunctival pallor or scleral icterus. Moist mucous membranes.  OP clear. Neck: Supple without lymphadenopathy, thyromegaly, JVD, or HJR. No carotid bruit. Lungs: Normal work of breathing. Clear to auscultation bilaterally without wheezes or crackles. Heart: Regular rate and rhythm without murmurs, rubs, or gallops. Non-displaced PMI. Abd: Bowel sounds present. Soft, NT/ND without hepatosplenomegaly Ext: No lower extremity edema. Radial, PT, and DP pulses are 2+ bilaterally. Skin: Warm and dry without rash.   Lab Results  Component Value Date   WBC 7.3 03/28/2017   HGB 13.8 03/28/2017   HCT 42.0 03/28/2017   MCV 91.5 03/28/2017   PLT 218.0 03/28/2017    Lab Results  Component Value Date   NA 141 03/28/2017   K 4.7 03/28/2017   CL 105 03/28/2017   CO2 27 03/28/2017   BUN 13 03/28/2017   CREATININE 0.64 03/28/2017   GLUCOSE 114 (H) 03/28/2017   ALT 22 03/28/2017    Lab Results  Component Value Date   CHOL 239 (H) 03/28/2017   HDL 45.40 03/28/2017   LDLCALC 158 (H) 03/28/2017   LDLDIRECT 152.0 08/01/2015    TRIG 177.0 (H) 03/28/2017   CHOLHDL 5 03/28/2017    --------------------------------------------------------------------------------------------------  ASSESSMENT AND PLAN: Atypical chestneck pain Symptoms have resolved. Stress test was low risk with small area of apical shifting breast attenuation versus ischemia. Ms. Delpizzo is tolerating low-dose metoprolol well, which we will continue. We will also continue with low-dose aspirin for primary prevention. We discussed additional medical therapy, including statin therapy (see details below). No further testing or intervention at this time.  Hyperlipidemia Patient was noted to have elevated LDL of 158 in May. In the setting of potential small area of ischemia involving the apex, I feel that initiation of statin therapy is warranted. We discussed the risks and benefits and have agreed to add rosuvastatin 5 mg daily. We will repeat a lipid panel and ALT in  about 3 months to assess response. Patient should discontinue red yeast rice given addition of rosuvastatin.  Hypertension Blood pressure is well controlled today. No medication changes.  Follow-up: Return to clinic in 6 months.  Nelva Bush, MD 06/03/2017 11:54 AM

## 2017-06-03 NOTE — Patient Instructions (Signed)
Medication Instructions:  Start crestor 5 mg daily  Labwork: Your physician recommends that you return for a FASTING lipid profile /ALT in about 3 months.   Testing/Procedures: none  Follow-Up: Your physician wants you to follow-up in: 6 months with Dr End. (January 2019).  You will receive a reminder letter in the mail two months in advance. If you don't receive a letter, please call our office to schedule the follow-up appointment.        If you need a refill on your cardiac medications before your next appointment, please call your pharmacy.

## 2017-06-05 ENCOUNTER — Other Ambulatory Visit: Payer: Self-pay | Admitting: Family Medicine

## 2017-06-07 ENCOUNTER — Encounter: Payer: Self-pay | Admitting: Internal Medicine

## 2017-06-07 ENCOUNTER — Ambulatory Visit (AMBULATORY_SURGERY_CENTER): Payer: Medicare Other | Admitting: Internal Medicine

## 2017-06-07 VITALS — BP 114/64 | HR 69 | Temp 98.8°F | Resp 12 | Ht 62.0 in | Wt 140.0 lb

## 2017-06-07 DIAGNOSIS — Z1211 Encounter for screening for malignant neoplasm of colon: Secondary | ICD-10-CM | POA: Diagnosis not present

## 2017-06-07 DIAGNOSIS — Z1212 Encounter for screening for malignant neoplasm of rectum: Secondary | ICD-10-CM | POA: Diagnosis not present

## 2017-06-07 MED ORDER — SODIUM CHLORIDE 0.9 % IV SOLN: 500.0000 mL | INTRAVENOUS | Status: DC

## 2017-06-07 NOTE — Patient Instructions (Addendum)
No polyps or cancer seen.  I am not recommending another routine colonoscopy.  I appreciate the opportunity to care for you. Gatha Mayer, MD, FACG  YOU HAD AN ENDOSCOPIC PROCEDURE TODAY AT [facility]:   Refer to the procedure report that was given to you for any specific questions about what was found during the examination.  If the procedure report does not answer your questions, please call your gastroenterologist to clarify.  If you requested that your care partner not be given the details of your procedure findings, then the procedure report has been included in a sealed envelope for you to review at your convenience later.  YOU SHOULD EXPECT: Some feelings of bloating in the abdomen. Passage of more gas than usual.  Walking can help get rid of the air that was put into your GI tract during the procedure and reduce the bloating. If you had a lower endoscopy (such as a colonoscopy or flexible sigmoidoscopy) you may notice spotting of blood in your stool or on the toilet paper. If you underwent a bowel prep for your procedure, you may not have a normal bowel movement for a few days.  Please Note:  You might notice some irritation and congestion in your nose or some drainage.  This is from the oxygen used during your procedure.  There is no need for concern and it should clear up in a day or so.  SYMPTOMS TO REPORT IMMEDIATELY:   Following lower endoscopy (colonoscopy or flexible sigmoidoscopy):  Excessive amounts of blood in the stool  Significant tenderness or worsening of abdominal pains  Swelling of the abdomen that is new, acute  Fever of 100F or higher   Following upper endoscopy (EGD)  Vomiting of blood or coffee ground material  New chest pain or pain under the shoulder blades  Painful or persistently difficult swallowing  New shortness of breath  Fever of 100F or higher  Black, tarry-looking stools  For urgent or emergent issues, a  gastroenterologist can be reached at any hour by calling [phone].   DIET:  We do recommend a small meal at first, but then you may proceed to your regular diet.  Drink plenty of fluids but you should avoid alcoholic beverages for 24 hours.  ACTIVITY:  You should plan to take it easy for the rest of today and you should NOT DRIVE or use heavy machinery until tomorrow (because of the sedation medicines used during the test).    FOLLOW UP: Our staff will call the number listed on your records the next business day following your procedure to check on you and address any questions or concerns that you may have regarding the information given to you following your procedure. If we do not reach you, we will leave a message.  However, if you are feeling well and you are not experiencing any problems, there is no need to return our call.  We will assume that you have returned to your regular daily activities without incident.  If any biopsies were taken you will be contacted by phone or by letter within the next 1-3 weeks.  Please call us at [phone] if you have not heard about the biopsies in 3 weeks.    SIGNATURES/CONFIDENTIALITY: You and/or your care partner have signed paperwork which will be entered into your electronic medical record.  These signatures attest to the fact that that the information above on your After Visit Summary has been reviewed and is  understood.  Full responsibility of the confidentiality of this discharge information lies with you and/or your care-partner.  No recall colkonoscopy recommended.

## 2017-06-07 NOTE — Op Note (Signed)
[facility] Patient Name: Tammy Grimes Procedure Date: 06/07/2017 11:17 AM MRN: [id number] Endoscopist: Gatha Mayer , MD Age: 71 Referring MD:  Date of Birth: [birth year] Gender: Female Account #: [id number] Procedure:                Colonoscopy Indications:              Screening for colorectal malignant neoplasm Medicines:                Propofol per Anesthesia, Monitored Anesthesia Care Procedure:                Pre-Anesthesia Assessment:                           - Prior to the procedure, a History and Physical                            was performed, and patient medications and                            allergies were reviewed. The patient's tolerance of                            previous anesthesia was also reviewed. The risks                            and benefits of the procedure and the sedation                            options and risks were discussed with the patient.                            All questions were answered, and informed consent                            was obtained. Prior Anticoagulants: The patient has                            taken no previous anticoagulant or antiplatelet                            agents. ASA Grade Assessment: II - A patient with                            mild systemic disease. After reviewing the risks                            and benefits, the patient was deemed in                            satisfactory condition to undergo the procedure.                           After obtaining informed consent, the colonoscope  was passed under direct vision. Throughout the                            procedure, the patient's blood pressure, pulse, and                            oxygen saturations were monitored continuously. The                            Colonoscope was introduced through the anus and                            advanced to the the cecum, identified by   appendiceal orifice and ileocecal valve. The                            ileocecal valve, appendiceal orifice, and rectum                            were photographed. The quality of the bowel                            preparation was excellent. The bowel preparation                            used was Miralax. Scope In: 11:23:50 AM Scope Out: 89:38:10 AM Scope Withdrawal Time: 0 hours 9 minutes 47 seconds  Total Procedure Duration: 0 hours 12 minutes 57 seconds  Findings:                 The perianal and digital rectal examinations were                            normal.                           Multiple small and large-mouthed diverticula were                            found in the sigmoid colon. There was no evidence                            of diverticular bleeding.                           The exam was otherwise without abnormality on                            direct and retroflexion views. Complications:            No immediate complications. Estimated blood loss:                            None. Estimated Blood Loss:     Estimated blood loss: none. Recommendation:           - Resume previous diet.                           -  Continue present medications.                           - No repeat colonoscopy due to age and the absence                            of colonic polyps. Gatha Mayer, MD 06/07/2017 11:42:17 AM This report has been signed electronically.

## 2017-06-07 NOTE — Progress Notes (Signed)
Pt to PACU-- AW patent--- VSS---- Report to RN 

## 2017-06-08 ENCOUNTER — Telehealth: Payer: Self-pay

## 2017-06-08 NOTE — Telephone Encounter (Signed)
  Follow up Call-  Call back number 06/07/2017  Post procedure Call Back phone  # [phone]  Permission to leave phone message Yes  Some recent data might be hidden     Patient questions:  Do you have a fever, pain , or abdominal swelling? No. Pain Score  0 *  Have you tolerated food without any problems? Yes.    Have you been able to return to your normal activities? Yes.    Do you have any questions about your discharge instructions: Diet   No. Medications  No. Follow up visit  No.  Do you have questions or concerns about your Care? No.  Actions: * If pain score is 4 or above: No action needed, pain <4.

## 2017-06-22 ENCOUNTER — Other Ambulatory Visit: Payer: Self-pay | Admitting: Family Medicine

## 2017-06-22 DIAGNOSIS — M9903 Segmental and somatic dysfunction of lumbar region: Secondary | ICD-10-CM | POA: Diagnosis not present

## 2017-06-22 DIAGNOSIS — M5432 Sciatica, left side: Secondary | ICD-10-CM | POA: Diagnosis not present

## 2017-07-20 DIAGNOSIS — M5432 Sciatica, left side: Secondary | ICD-10-CM | POA: Diagnosis not present

## 2017-07-20 DIAGNOSIS — M9903 Segmental and somatic dysfunction of lumbar region: Secondary | ICD-10-CM | POA: Diagnosis not present

## 2017-08-10 DIAGNOSIS — M5432 Sciatica, left side: Secondary | ICD-10-CM | POA: Diagnosis not present

## 2017-08-10 DIAGNOSIS — M9903 Segmental and somatic dysfunction of lumbar region: Secondary | ICD-10-CM | POA: Diagnosis not present

## 2017-08-15 DIAGNOSIS — L814 Other melanin hyperpigmentation: Secondary | ICD-10-CM | POA: Diagnosis not present

## 2017-08-15 DIAGNOSIS — D1801 Hemangioma of skin and subcutaneous tissue: Secondary | ICD-10-CM | POA: Diagnosis not present

## 2017-08-15 DIAGNOSIS — Z85828 Personal history of other malignant neoplasm of skin: Secondary | ICD-10-CM | POA: Diagnosis not present

## 2017-08-15 DIAGNOSIS — D485 Neoplasm of uncertain behavior of skin: Secondary | ICD-10-CM | POA: Diagnosis not present

## 2017-08-15 DIAGNOSIS — L820 Inflamed seborrheic keratosis: Secondary | ICD-10-CM | POA: Diagnosis not present

## 2017-08-15 DIAGNOSIS — L821 Other seborrheic keratosis: Secondary | ICD-10-CM | POA: Diagnosis not present

## 2017-08-22 ENCOUNTER — Other Ambulatory Visit: Payer: Medicare Other

## 2017-08-22 DIAGNOSIS — E782 Mixed hyperlipidemia: Secondary | ICD-10-CM

## 2017-08-22 DIAGNOSIS — I10 Essential (primary) hypertension: Secondary | ICD-10-CM | POA: Diagnosis not present

## 2017-08-22 DIAGNOSIS — Z23 Encounter for immunization: Secondary | ICD-10-CM | POA: Diagnosis not present

## 2017-08-22 LAB — LIPID PANEL
CHOLESTEROL TOTAL: 123 mg/dL (ref 100–199)
Chol/HDL Ratio: 2.9 ratio (ref 0.0–4.4)
HDL: 42 mg/dL (ref >39)
LDL CALC: 60 mg/dL (ref 0–99)
TRIGLYCERIDES: 107 mg/dL (ref 0–149)
VLDL CHOLESTEROL CAL: 21 mg/dL (ref 5–40)

## 2017-08-22 LAB — ALT: ALT: 19 IU/L (ref 0–32)

## 2017-08-22 NOTE — Progress Notes (Signed)
[id number]  

## 2017-08-23 DIAGNOSIS — Z803 Family history of malignant neoplasm of breast: Secondary | ICD-10-CM | POA: Diagnosis not present

## 2017-08-23 DIAGNOSIS — Z1231 Encounter for screening mammogram for malignant neoplasm of breast: Secondary | ICD-10-CM | POA: Diagnosis not present

## 2017-08-23 LAB — HM MAMMOGRAPHY: HM MAMMO: ABNORMAL — AB (ref 0–4)

## 2017-08-31 DIAGNOSIS — M5432 Sciatica, left side: Secondary | ICD-10-CM | POA: Diagnosis not present

## 2017-08-31 DIAGNOSIS — M9903 Segmental and somatic dysfunction of lumbar region: Secondary | ICD-10-CM | POA: Diagnosis not present

## 2017-09-10 ENCOUNTER — Other Ambulatory Visit: Payer: Self-pay | Admitting: Family Medicine

## 2017-09-13 ENCOUNTER — Other Ambulatory Visit (INDEPENDENT_AMBULATORY_CARE_PROVIDER_SITE_OTHER): Payer: Medicare Other

## 2017-09-13 DIAGNOSIS — R739 Hyperglycemia, unspecified: Secondary | ICD-10-CM | POA: Diagnosis not present

## 2017-09-13 DIAGNOSIS — E559 Vitamin D deficiency, unspecified: Secondary | ICD-10-CM | POA: Diagnosis not present

## 2017-09-13 DIAGNOSIS — E785 Hyperlipidemia, unspecified: Secondary | ICD-10-CM

## 2017-09-13 DIAGNOSIS — I10 Essential (primary) hypertension: Secondary | ICD-10-CM

## 2017-09-13 LAB — LIPID PANEL
CHOL/HDL RATIO: 3
Cholesterol: 125 mg/dL (ref 0–200)
HDL: 43 mg/dL (ref >39.00)
LDL Cholesterol: 60 mg/dL (ref 0–99)
NONHDL: 82.21
Triglycerides: 109 mg/dL (ref 0.0–149.0)
VLDL: 21.8 mg/dL (ref 0.0–40.0)

## 2017-09-13 LAB — COMPREHENSIVE METABOLIC PANEL
ALT: 18 U/L (ref 0–35)
AST: 16 U/L (ref 0–37)
Albumin: 4.3 g/dL (ref 3.5–5.2)
Alkaline Phosphatase: 64 U/L (ref 39–117)
BILIRUBIN TOTAL: 0.6 mg/dL (ref 0.2–1.2)
BUN: 11 mg/dL (ref 6–23)
CO2: 27 meq/L (ref 19–32)
CREATININE: 0.64 mg/dL (ref 0.40–1.20)
Calcium: 9.9 mg/dL (ref 8.4–10.5)
Chloride: 105 mEq/L (ref 96–112)
GFR: 97.56 mL/min (ref >60.00)
Glucose, Bld: 102 mg/dL — ABNORMAL HIGH (ref 70–99)
Potassium: 4.7 mEq/L (ref 3.5–5.1)
Sodium: 141 mEq/L (ref 135–145)
Total Protein: 7 g/dL (ref 6.0–8.3)

## 2017-09-13 LAB — CBC
HCT: 39.3 % (ref 36.0–46.0)
Hemoglobin: 12.7 g/dL (ref 12.0–15.0)
MCHC: 32.5 g/dL (ref 30.0–36.0)
MCV: 92.9 fl (ref 78.0–100.0)
Platelets: 201 10*3/uL (ref 150.0–400.0)
RBC: 4.23 Mil/uL (ref 3.87–5.11)
RDW: 13.7 % (ref 11.5–15.5)
WBC: 8 10*3/uL (ref 4.0–10.5)

## 2017-09-13 LAB — TSH: TSH: 3.14 u[IU]/mL (ref 0.35–4.50)

## 2017-09-13 LAB — VITAMIN D 25 HYDROXY (VIT D DEFICIENCY, FRACTURES): VITD: 78.2 ng/mL (ref 30.00–100.00)

## 2017-09-13 LAB — HEMOGLOBIN A1C: HEMOGLOBIN A1C: 5.9 % (ref 4.6–6.5)

## 2017-09-20 ENCOUNTER — Encounter: Payer: Self-pay | Admitting: Family Medicine

## 2017-09-20 ENCOUNTER — Ambulatory Visit (INDEPENDENT_AMBULATORY_CARE_PROVIDER_SITE_OTHER): Payer: Medicare Other | Admitting: Family Medicine

## 2017-09-20 DIAGNOSIS — M25562 Pain in left knee: Secondary | ICD-10-CM | POA: Diagnosis not present

## 2017-09-20 DIAGNOSIS — E559 Vitamin D deficiency, unspecified: Secondary | ICD-10-CM | POA: Diagnosis not present

## 2017-09-20 DIAGNOSIS — R739 Hyperglycemia, unspecified: Secondary | ICD-10-CM

## 2017-09-20 DIAGNOSIS — I10 Essential (primary) hypertension: Secondary | ICD-10-CM | POA: Diagnosis not present

## 2017-09-20 DIAGNOSIS — R0789 Other chest pain: Secondary | ICD-10-CM | POA: Diagnosis not present

## 2017-09-20 DIAGNOSIS — E079 Disorder of thyroid, unspecified: Secondary | ICD-10-CM | POA: Diagnosis not present

## 2017-09-20 DIAGNOSIS — M25561 Pain in right knee: Secondary | ICD-10-CM | POA: Diagnosis not present

## 2017-09-20 DIAGNOSIS — M8580 Other specified disorders of bone density and structure, unspecified site: Secondary | ICD-10-CM | POA: Diagnosis not present

## 2017-09-20 DIAGNOSIS — E7800 Pure hypercholesterolemia, unspecified: Secondary | ICD-10-CM | POA: Diagnosis not present

## 2017-09-20 DIAGNOSIS — E782 Mixed hyperlipidemia: Secondary | ICD-10-CM

## 2017-09-20 NOTE — Assessment & Plan Note (Signed)
On Levothyroxine, continue to monitor 

## 2017-09-20 NOTE — Patient Instructions (Addendum)
Curcumen/.Turmeric by [email or web address]  Shingrix is the new shingles shot, 2 shots over 2-6 months at pharmacy Arthritis Arthritis means joint pain. It can also mean joint disease. A joint is a place where bones come together. People who have arthritis may have:  Red joints.  Swollen joints.  Stiff joints.  Warm joints.  A fever.  A feeling of being sick.  Follow these instructions at home: Pay attention to any changes in your symptoms. Take these actions to help with your pain and swelling. Medicines  Take over-the-counter and prescription medicines only as told by your doctor.  Do not take aspirin for pain if your doctor says that you may have gout. Activity  Rest your joint if your doctor tells you to.  Avoid activities that make the pain worse.  Exercise your joint regularly as told by your doctor. Try doing exercises like: ? Swimming. ? Water aerobics. ? Biking. ? Walking. Joint Care   If your joint is swollen, keep it raised (elevated) if told by your doctor.  If your joint feels stiff in the morning, try taking a warm shower.  If you have diabetes, do not apply heat without asking your doctor.  If told, apply heat to the joint: ? Put a towel between the joint and the hot pack or heating pad. ? Leave the heat on the area for 20-30 minutes.  If told, apply ice to the joint: ? Put ice in a plastic bag. ? Place a towel between your skin and the bag. ? Leave the ice on for 20 minutes, 2-3 times per day.  Keep all follow-up visits as told by your doctor. Contact a doctor if:  The pain gets worse.  You have a fever. Get help right away if:  You have very bad pain in your joint.  You have swelling in your joint.  Your joint is red.  Many joints become painful and swollen.  You have very bad back pain.  Your leg is very weak.  You cannot control your pee (urine) or poop (stool). This information is not intended to replace advice  given to you by your health care provider. Make sure you discuss any questions you have with your health care provider. Document Released: 01/19/2010 Document Revised: 04/01/2016 Document Reviewed: 01/20/2015 Elsevier Interactive Patient Education  [employer].

## 2017-09-20 NOTE — Assessment & Plan Note (Signed)
Encouraged to get adequate exercise, calcium and vitamin d intake 

## 2017-09-20 NOTE — Progress Notes (Signed)
Subjective:  I acted as a Education administrator for [employer]. Tammy Grimes, Tammy Grimes   Patient ID: Tammy Grimes, female    DOB: Sep 01, 1946, 71 y.o.   MRN: [id number]  Chief Complaint  Patient presents with  . Follow-up    HPI  Patient is in today for 6 month follow up and she is feeling well today. She had an episode of atypical chest pain since last seen and was seen by cardiology. They put her through a stress test and her results were reassuring and normal. She continues to struggle with left hip pain and some radicular discomfort into leg is noted. No recent fall or trauma. Is [place] with her chiropractor and getting by. Is also noting pain and even weakness in knees as well. No warmth or swelling. Is reporting BP elsewhere at 120s over 70s generally. Denies CP/palp/SOB/HA/congestion/fevers/GI or GU c/o. Taking meds as prescribed  Patient Care Team: [name], MD as PCP - General (Family Medicine) Martinique, Amy, MD as Consulting Physician (Dermatology) Rutherford Guys, MD as Consulting Physician (Ophthalmology)   Past Medical History:  Diagnosis Date  . Allergy   . Anemia    prior to hysterectomy  . BCC (basal cell carcinoma) 08/01/2015  . Cancer ([place]) 2007   calf of right leg- melanoma  . Cataract   . Cervical cancer screening 06/06/2012  . DDD (degenerative disc disease), cervical 08/01/2015  . GERD (gastroesophageal reflux disease)   . Hyperglycemia 03/28/2017  . Hyperlipidemia   . Hypertension 65  . Knee pain 03/30/2012   L>R  . Medicare annual wellness visit, subsequent 02/09/2015  . Melanoma of skin ([place]) 08/19/2016  . Preventative health care 03/30/2012  . Thyroid disease 64  . Vitamin D deficiency 06/06/2012    Past Surgical History:  Procedure Laterality Date  . ABDOMINAL HYSTERECTOMY  1990's   total, for heavy bleeding and fibroids  . BASAL CELL CARCINOMA EXCISION    . BREAST SURGERY  early 70's   fibroid tumors removed- benign  . CATARACT EXTRACTION Bilateral 12/09/12  .  COLONOSCOPY    . EYE SURGERY Bilateral 2015   cataracts  . MELANOMA EXCISION  2007    Family History  Problem Relation Age of Onset  . Hypertension Mother   . Alzheimer's disease Mother   . Dementia Mother        alzheimer's  . Aortic aneurysm Father   . Hypertension Father   . COPD Father        smoker  . Heart disease Father        s/p bypass, aortic aneurysm, carotid artery disease  . Cancer Sister 68       breast- remission  . Diabetes Son 22       type 1  . Alzheimer's disease Maternal Grandmother   . Cancer Maternal Grandfather        prostate  . Stroke Paternal Grandfather   . Colon cancer Neg Hx   . Esophageal cancer Neg Hx   . Pancreatic cancer Neg Hx   . Rectal cancer Neg Hx   . Stomach cancer Neg Hx     Social History   Socioeconomic History  . Marital status: Married    Spouse name: Not on file  . Number of children: Not on file  . Years of education: Not on file  . Highest education level: Not on file  Social Needs  . Financial resource strain: Not on file  . Food insecurity - worry: Not on file  .  Food insecurity - inability: Not on file  . Transportation needs - medical: Not on file  . Transportation needs - non-medical: Not on file  Occupational History  . Not on file  Tobacco Use  . Smoking status: Never Smoker  . Smokeless tobacco: Never Used  Substance and Sexual Activity  . Alcohol use: No  . Drug use: No  . Sexual activity: Yes    Comment: lives with husband, retired from [employer] work, no dietary restrictions  Other Topics Concern  . Not on file  Social History Narrative  . Not on file    Outpatient Medications Prior to Visit  Medication Sig Dispense Refill  . amLODipine (NORVASC) 5 MG tablet TAKE 1 TABLET(5 MG) BY MOUTH DAILY 90 tablet 0  . aspirin 81 MG tablet Take 81 mg by mouth daily.    . calcium-vitamin D 250-100 MG-UNIT per tablet Take 1 tablet by mouth daily.    . Cholecalciferol (VITAMIN D3) 5000 UNITS CAPS Take  5,000 Units by mouth daily.    Marland Kitchen KRILL OIL PO Take 1 capsule by mouth daily.    . metoprolol tartrate (LOPRESSOR) 25 MG tablet Take 1/2 tablet (12.5mg  ) by mouth two times a day 90 tablet 3  . omeprazole (PRILOSEC) 20 MG capsule TAKE 1 CAPSULE(20 MG) BY MOUTH DAILY 90 capsule 0  . ranitidine (ZANTAC) 300 MG tablet Take 1 tablet (300 mg total) by mouth at bedtime. 90 tablet 1  . rosuvastatin (CRESTOR) 5 MG tablet Take 1 tablet (5 mg total) by mouth daily. 90 tablet 1  . SYNTHROID 75 MCG tablet TAKE 1 TABLET(75 MCG) BY MOUTH DAILY BEFORE BREAKFAST 90 tablet 0   Facility-Administered Medications Prior to Visit  Medication Dose Route Frequency Provider Last Rate Last Dose  . 0.9 %  sodium chloride infusion  500 mL Intravenous Continuous Gatha Mayer, MD        Allergies  Allergen Reactions  . Penicillins Rash    Review of Systems  Constitutional: Negative for fever and malaise/fatigue.  HENT: Negative for congestion.   Respiratory: Negative for cough and shortness of breath.   Cardiovascular: Negative for chest pain and palpitations.  Gastrointestinal: Negative for vomiting.  Musculoskeletal: Positive for joint pain. Negative for back pain.  Skin: Negative for rash.  Neurological: Negative for loss of consciousness and headaches.       Objective:    Physical Exam  Constitutional: She is oriented to person, place, and time. She appears well-developed and well-nourished. No distress.  HENT:  Head: Normocephalic and atraumatic.  Nose: Nose normal.  Eyes: Right eye exhibits no discharge. Left eye exhibits no discharge.  Neck: Normal range of motion. Neck supple.  Cardiovascular: Normal rate and regular rhythm.  No murmur heard. Pulmonary/Chest: Effort normal and breath sounds normal.  Abdominal: Soft. Bowel sounds are normal. There is no tenderness.  Musculoskeletal: She exhibits no edema.  Neurological: She is alert and oriented to person, place, and time.  Skin: Skin is warm  and dry.  Psychiatric: She has a normal mood and affect.  Nursing note and vitals reviewed.   BP (!) 144/82   Pulse 66   Temp 97.7 F (36.5 C) (Oral)   Resp 16   Wt 143 lb 12.8 oz (65.2 kg)   SpO2 96%   BMI 26.30 kg/m  Wt Readings from Last 3 Encounters:  09/20/17 143 lb 12.8 oz (65.2 kg)  06/07/17 140 lb (63.5 kg)  06/03/17 138 lb (62.6 kg)  BP Readings from Last 3 Encounters:  09/20/17 (!) 144/82  06/07/17 114/64  06/03/17 126/72     Immunization History  Administered Date(s) Administered  . Influenza Split 08/31/2012  . Influenza Whole 08/09/2011  . Influenza, High Dose Seasonal PF 08/19/2016  . Influenza,inj,Quad PF,6+ Mos 07/24/2013, 08/01/2015  . Influenza-Unspecified 07/24/2014, 08/22/2017  . Pneumococcal Conjugate-13 07/24/2013  . Pneumococcal Polysaccharide-23 08/01/2015  . Td 11/08/2005  . Tdap 01/30/2016  . Zoster 06/07/2012    Health Maintenance  Topic Date Due  . MAMMOGRAM  08/23/2018  . TETANUS/TDAP  01/29/2026  . INFLUENZA VACCINE  Completed  . DEXA SCAN  Completed  . Hepatitis C Screening  Completed  . PNA vac Low Risk Adult  Completed    Lab Results  Component Value Date   WBC 8.0 09/13/2017   HGB 12.7 09/13/2017   HCT 39.3 09/13/2017   PLT 201.0 09/13/2017   GLUCOSE 102 (H) 09/13/2017   CHOL 125 09/13/2017   TRIG 109.0 09/13/2017   HDL 43.00 09/13/2017   LDLDIRECT 152.0 08/01/2015   LDLCALC 60 09/13/2017   ALT 18 09/13/2017   AST 16 09/13/2017   NA 141 09/13/2017   K 4.7 09/13/2017   CL 105 09/13/2017   CREATININE 0.64 09/13/2017   BUN 11 09/13/2017   CO2 27 09/13/2017   TSH 3.14 09/13/2017   HGBA1C 5.9 09/13/2017    Lab Results  Component Value Date   TSH 3.14 09/13/2017   Lab Results  Component Value Date   WBC 8.0 09/13/2017   HGB 12.7 09/13/2017   HCT 39.3 09/13/2017   MCV 92.9 09/13/2017   PLT 201.0 09/13/2017   Lab Results  Component Value Date   NA 141 09/13/2017   K 4.7 09/13/2017   CO2 27 09/13/2017    GLUCOSE 102 (H) 09/13/2017   BUN 11 09/13/2017   CREATININE 0.64 09/13/2017   BILITOT 0.6 09/13/2017   ALKPHOS 64 09/13/2017   AST 16 09/13/2017   ALT 18 09/13/2017   PROT 7.0 09/13/2017   ALBUMIN 4.3 09/13/2017   CALCIUM 9.9 09/13/2017   GFR 97.56 09/13/2017   Lab Results  Component Value Date   CHOL 125 09/13/2017   Lab Results  Component Value Date   HDL 43.00 09/13/2017   Lab Results  Component Value Date   LDLCALC 60 09/13/2017   Lab Results  Component Value Date   TRIG 109.0 09/13/2017   Lab Results  Component Value Date   CHOLHDL 3 09/13/2017   Lab Results  Component Value Date   HGBA1C 5.9 09/13/2017         Assessment & Plan:   Problem List Items Addressed This Visit    Thyroid disease    On Levothyroxine, continue to monitor      Essential hypertension    Well controlled, no changes to meds. Encouraged heart healthy diet such as the DASH diet and exercise as tolerated.       Relevant Orders   CBC   Comprehensive metabolic panel   TSH   Hyperlipidemia    Tolerating statin, encouraged heart healthy diet, avoid trans fats, minimize simple carbs and saturated fats. Increase exercise as tolerated. Tolerating Rosuvastatin      Relevant Orders   Lipid panel   Knee pain    B/l with some instability, encouraged Tylenol scheduled bid. Consider Curcmen caps daily      Osteopenia    Encouraged to get adequate exercise, calcium and vitamin d intake  Vitamin D deficiency    WNL       Relevant Orders   Comprehensive metabolic panel   VITAMIN D 25 Hydroxy (Vit-D Deficiency, Fractures)   Hyperglycemia    hgba1c acceptable, minimize simple carbs. Increase exercise as tolerated.       Relevant Orders   Hemoglobin A1c   Atypical chest pain    No further episodes since seeing cardiology and is happy with negative stress test      RESOLVED: Pure hypercholesterolemia      I am having Tammy Grimes. Mcelwain "[name]" maintain her aspirin, KRILL  OIL PO, calcium-vitamin D, ranitidine, Vitamin D3, metoprolol tartrate, rosuvastatin, amLODipine, omeprazole, and SYNTHROID. We will continue to administer sodium chloride.  No orders of the defined types were placed in this encounter.   CMA served as Education administrator during this visit. History, Physical and Plan performed by medical provider. Documentation and orders reviewed and attested to.  Penni Homans, MD

## 2017-09-20 NOTE — Assessment & Plan Note (Signed)
WNL

## 2017-09-20 NOTE — Assessment & Plan Note (Signed)
Tolerating statin, encouraged heart healthy diet, avoid trans fats, minimize simple carbs and saturated fats. Increase exercise as tolerated. Tolerating Rosuvastatin 

## 2017-09-20 NOTE — Assessment & Plan Note (Signed)
hgba1c acceptable, minimize simple carbs. Increase exercise as tolerated.  

## 2017-09-20 NOTE — Assessment & Plan Note (Addendum)
B/l with some instability, encouraged Tylenol scheduled bid. Consider Curcmen caps daily

## 2017-09-20 NOTE — Assessment & Plan Note (Deleted)
Encouraged heart healthy diet, increase exercise, avoid trans fats, consider a krill oil cap daily 

## 2017-09-20 NOTE — Assessment & Plan Note (Signed)
Well controlled, no changes to meds. Encouraged heart healthy diet such as the DASH diet and exercise as tolerated.  °

## 2017-09-20 NOTE — Assessment & Plan Note (Signed)
No further episodes since seeing cardiology and is happy with negative stress test

## 2017-09-21 DIAGNOSIS — M5432 Sciatica, left side: Secondary | ICD-10-CM | POA: Diagnosis not present

## 2017-09-21 DIAGNOSIS — M9903 Segmental and somatic dysfunction of lumbar region: Secondary | ICD-10-CM | POA: Diagnosis not present

## 2017-09-28 ENCOUNTER — Telehealth: Payer: Self-pay | Admitting: *Deleted

## 2017-09-28 NOTE — Telephone Encounter (Signed)
Received requested Mammogram Report results from [facility]; forwarded to provider/SLS 11/21

## 2017-10-04 ENCOUNTER — Encounter: Payer: Self-pay | Admitting: Family Medicine

## 2017-10-05 DIAGNOSIS — M9903 Segmental and somatic dysfunction of lumbar region: Secondary | ICD-10-CM | POA: Diagnosis not present

## 2017-10-05 DIAGNOSIS — M5432 Sciatica, left side: Secondary | ICD-10-CM | POA: Diagnosis not present

## 2017-10-20 ENCOUNTER — Encounter: Payer: Self-pay | Admitting: Family Medicine

## 2017-10-26 DIAGNOSIS — M5432 Sciatica, left side: Secondary | ICD-10-CM | POA: Diagnosis not present

## 2017-10-26 DIAGNOSIS — M9903 Segmental and somatic dysfunction of lumbar region: Secondary | ICD-10-CM | POA: Diagnosis not present

## 2017-11-14 DIAGNOSIS — M9903 Segmental and somatic dysfunction of lumbar region: Secondary | ICD-10-CM | POA: Diagnosis not present

## 2017-11-14 DIAGNOSIS — M5432 Sciatica, left side: Secondary | ICD-10-CM | POA: Diagnosis not present

## 2017-12-10 ENCOUNTER — Other Ambulatory Visit: Payer: Self-pay | Admitting: Family Medicine

## 2017-12-13 DIAGNOSIS — M9903 Segmental and somatic dysfunction of lumbar region: Secondary | ICD-10-CM | POA: Diagnosis not present

## 2017-12-13 DIAGNOSIS — M5432 Sciatica, left side: Secondary | ICD-10-CM | POA: Diagnosis not present

## 2017-12-29 DIAGNOSIS — M5432 Sciatica, left side: Secondary | ICD-10-CM | POA: Diagnosis not present

## 2017-12-29 DIAGNOSIS — M9903 Segmental and somatic dysfunction of lumbar region: Secondary | ICD-10-CM | POA: Diagnosis not present

## 2018-01-06 ENCOUNTER — Other Ambulatory Visit: Payer: Self-pay | Admitting: *Deleted

## 2018-01-06 MED ORDER — ROSUVASTATIN CALCIUM 5 MG PO TABS: 5.0000 mg | ORAL_TABLET | Freq: Every day | ORAL | 0 refills | Status: DC

## 2018-01-17 DIAGNOSIS — M9903 Segmental and somatic dysfunction of lumbar region: Secondary | ICD-10-CM | POA: Diagnosis not present

## 2018-01-17 DIAGNOSIS — M5432 Sciatica, left side: Secondary | ICD-10-CM | POA: Diagnosis not present

## 2018-02-09 DIAGNOSIS — M9903 Segmental and somatic dysfunction of lumbar region: Secondary | ICD-10-CM | POA: Diagnosis not present

## 2018-02-09 DIAGNOSIS — M5432 Sciatica, left side: Secondary | ICD-10-CM | POA: Diagnosis not present

## 2018-03-09 DIAGNOSIS — M5432 Sciatica, left side: Secondary | ICD-10-CM | POA: Diagnosis not present

## 2018-03-09 DIAGNOSIS — M9903 Segmental and somatic dysfunction of lumbar region: Secondary | ICD-10-CM | POA: Diagnosis not present

## 2018-03-09 IMAGING — NM NM MISC PROCEDURE
3 series · 18 of 18 positions shown · non-contrast
Comparison: none

[Series 1: wbr_r-proj_st rest_(id)_sa · 6.5mm · 6.51mm/px · 6 of 64 frames shown]
[frame 6/64]
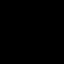
[frame 16/64]
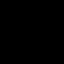
[frame 27/64]
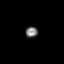
[frame 38/64]
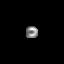
[frame 48/64]
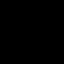
[frame 59/64]
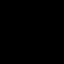

[Series 1: wbr_s-proj_st stress_(id)_sa · 6.5mm · 6.51mm/px · 6 of 512 frames shown (1 of 2)]
[frame 43/512]
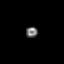
[frame 128/512]
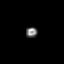
[frame 214/512]
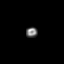
[frame 299/512]
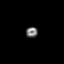
[frame 384/512]
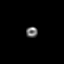
[frame 470/512]
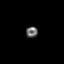

[Series 1: wbr_s-proj_st stress_(id)_sa · 6.5mm · 6.51mm/px · 6 of 64 frames shown (2 of 2)]
[frame 6/64]
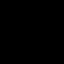
[frame 16/64]
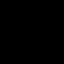
[frame 27/64]
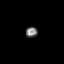
[frame 38/64]
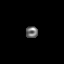
[frame 48/64]
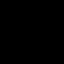
[frame 59/64]
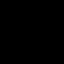

[18 of 18 positions shown; findings below may reference images not displayed]

Canned report from images found in remote index.

Refer to host system for actual result text.

## 2018-03-14 ENCOUNTER — Other Ambulatory Visit: Payer: Medicare Other

## 2018-03-20 ENCOUNTER — Other Ambulatory Visit (INDEPENDENT_AMBULATORY_CARE_PROVIDER_SITE_OTHER): Payer: Medicare Other

## 2018-03-20 DIAGNOSIS — E559 Vitamin D deficiency, unspecified: Secondary | ICD-10-CM | POA: Diagnosis not present

## 2018-03-20 DIAGNOSIS — R739 Hyperglycemia, unspecified: Secondary | ICD-10-CM

## 2018-03-20 DIAGNOSIS — E782 Mixed hyperlipidemia: Secondary | ICD-10-CM

## 2018-03-20 DIAGNOSIS — I10 Essential (primary) hypertension: Secondary | ICD-10-CM

## 2018-03-20 LAB — LIPID PANEL
CHOL/HDL RATIO: 4
Cholesterol: 102 mg/dL (ref 0–200)
HDL: 28.7 mg/dL — ABNORMAL LOW (ref >39.00)
LDL CALC: 45 mg/dL (ref 0–99)
NONHDL: 73.35
TRIGLYCERIDES: 142 mg/dL (ref 0.0–149.0)
VLDL: 28.4 mg/dL (ref 0.0–40.0)

## 2018-03-20 LAB — CBC
HEMATOCRIT: 38.1 % (ref 36.0–46.0)
Hemoglobin: 12.5 g/dL (ref 12.0–15.0)
MCHC: 32.9 g/dL (ref 30.0–36.0)
MCV: 90.3 fl (ref 78.0–100.0)
Platelets: 219 10*3/uL (ref 150.0–400.0)
RBC: 4.22 Mil/uL (ref 3.87–5.11)
RDW: 14 % (ref 11.5–15.5)
WBC: 8.4 10*3/uL (ref 4.0–10.5)

## 2018-03-20 LAB — COMPREHENSIVE METABOLIC PANEL
ALT: 21 U/L (ref 0–35)
AST: 14 U/L (ref 0–37)
Albumin: 4.2 g/dL (ref 3.5–5.2)
Alkaline Phosphatase: 60 U/L (ref 39–117)
BILIRUBIN TOTAL: 0.6 mg/dL (ref 0.2–1.2)
BUN: 12 mg/dL (ref 6–23)
CHLORIDE: 106 meq/L (ref 96–112)
CO2: 29 meq/L (ref 19–32)
CREATININE: 0.67 mg/dL (ref 0.40–1.20)
Calcium: 9.3 mg/dL (ref 8.4–10.5)
GFR: 92.4 mL/min (ref >60.00)
GLUCOSE: 115 mg/dL — AB (ref 70–99)
Potassium: 4.4 mEq/L (ref 3.5–5.1)
SODIUM: 143 meq/L (ref 135–145)
Total Protein: 6.5 g/dL (ref 6.0–8.3)

## 2018-03-20 LAB — VITAMIN D 25 HYDROXY (VIT D DEFICIENCY, FRACTURES): VITD: 76.49 ng/mL (ref 30.00–100.00)

## 2018-03-20 LAB — HEMOGLOBIN A1C: HEMOGLOBIN A1C: 6.1 % (ref 4.6–6.5)

## 2018-03-20 LAB — TSH: TSH: 3.35 u[IU]/mL (ref 0.35–4.50)

## 2018-03-21 ENCOUNTER — Ambulatory Visit: Payer: Medicare Other | Admitting: Family Medicine

## 2018-03-23 ENCOUNTER — Encounter: Payer: Self-pay | Admitting: Family Medicine

## 2018-03-23 ENCOUNTER — Ambulatory Visit (INDEPENDENT_AMBULATORY_CARE_PROVIDER_SITE_OTHER): Payer: Medicare Other | Admitting: Family Medicine

## 2018-03-23 ENCOUNTER — Ambulatory Visit (HOSPITAL_BASED_OUTPATIENT_CLINIC_OR_DEPARTMENT_OTHER)
Admission: RE | Admit: 2018-03-23 | Discharge: 2018-03-23 | Disposition: A | Discharge status: Home / Self Care | Payer: Medicare Other | Source: Ambulatory Visit | Attending: Family Medicine | Admitting: Family Medicine

## 2018-03-23 DIAGNOSIS — R739 Hyperglycemia, unspecified: Secondary | ICD-10-CM | POA: Diagnosis not present

## 2018-03-23 DIAGNOSIS — E782 Mixed hyperlipidemia: Secondary | ICD-10-CM

## 2018-03-23 DIAGNOSIS — K2100 Gastro-esophageal reflux disease with esophagitis, without bleeding: Secondary | ICD-10-CM

## 2018-03-23 DIAGNOSIS — I10 Essential (primary) hypertension: Secondary | ICD-10-CM

## 2018-03-23 DIAGNOSIS — M9903 Segmental and somatic dysfunction of lumbar region: Secondary | ICD-10-CM | POA: Diagnosis not present

## 2018-03-23 DIAGNOSIS — M25562 Pain in left knee: Secondary | ICD-10-CM | POA: Diagnosis not present

## 2018-03-23 DIAGNOSIS — M5116 Intervertebral disc disorders with radiculopathy, lumbar region: Secondary | ICD-10-CM | POA: Insufficient documentation

## 2018-03-23 DIAGNOSIS — M545 Low back pain, unspecified: Secondary | ICD-10-CM

## 2018-03-23 DIAGNOSIS — M5432 Sciatica, left side: Secondary | ICD-10-CM | POA: Diagnosis not present

## 2018-03-23 DIAGNOSIS — M25561 Pain in right knee: Secondary | ICD-10-CM | POA: Insufficient documentation

## 2018-03-23 HISTORY — DX: Low back pain, unspecified: M54.50

## 2018-03-23 MED ORDER — OMEPRAZOLE 20 MG PO CPDR: 20.0000 mg | DELAYED_RELEASE_CAPSULE | Freq: Every day | ORAL | 3 refills | Status: DC

## 2018-03-23 NOTE — Patient Instructions (Signed)
Cholesterol Cholesterol is a fat. Your body needs a small amount of cholesterol. Cholesterol (plaque) may build up in your blood vessels (arteries). That makes you more likely to have a heart attack or stroke. You cannot feel your cholesterol level. Having a blood test is the only way to find out if your level is high. Keep your test results. Work with your doctor to keep your cholesterol at a good level. What do the results mean?  Total cholesterol is how much cholesterol is in your blood.  LDL is bad cholesterol. This is the type that can build up. Try to have low LDL.  HDL is good cholesterol. It cleans your blood vessels and carries LDL away. Try to have high HDL.  Triglycerides are fat that the body can store or burn for energy. What are good levels of cholesterol?  Total cholesterol below 200.  LDL below 100 is good for people who have health risks. LDL below 70 is good for people who have very high risks.  HDL above 40 is good. It is best to have HDL of 60 or higher.  Triglycerides below 150. How can I lower my cholesterol? Diet Follow your diet program as told by your doctor.  Choose fish, white meat chicken, or turkey that is roasted or baked. Try not to eat red meat, fried foods, sausage, or lunch meats.  Eat lots of fresh fruits and vegetables.  Choose whole grains, beans, pasta, potatoes, and cereals.  Choose olive oil, corn oil, or canola oil. Only use small amounts.  Try not to eat butter, mayonnaise, shortening, or palm kernel oils.  Try not to eat foods with trans fats.  Choose low-fat or nonfat dairy foods. ? Drink skim or nonfat milk. ? Eat low-fat or nonfat yogurt and cheeses. ? Try not to drink whole milk or cream. ? Try not to eat ice cream, egg yolks, or full-fat cheeses.  Healthy desserts include angel food cake, ginger snaps, animal crackers, hard candy, popsicles, and low-fat or nonfat frozen yogurt. Try not to eat pastries, cakes, pies, and  cookies.  Exercise Follow your exercise program as told by your doctor.  Be more active. Try gardening, walking, and taking the stairs.  Ask your doctor about ways that you can be more active.  Medicine  Take over-the-counter and prescription medicines only as told by your doctor. This information is not intended to replace advice given to you by your health care provider. Make sure you discuss any questions you have with your health care provider. Document Released: 01/21/2009 Document Revised: 05/26/2016 Document Reviewed: 05/06/2016 Elsevier Interactive Patient Education  2018 [employer]  

## 2018-03-23 NOTE — Assessment & Plan Note (Signed)
Avoid offending foods, start probiotics. Do not eat large meals in late evening and consider raising head of bed.  

## 2018-03-23 NOTE — Assessment & Plan Note (Signed)
hgba1c acceptable, minimize simple carbs. Increase exercise as tolerated. Continue current meds 

## 2018-03-23 NOTE — Assessment & Plan Note (Signed)
Tolerating statin, encouraged heart healthy diet, avoid trans fats, minimize simple carbs and saturated fats. Increase exercise as tolerated 

## 2018-03-23 NOTE — Assessment & Plan Note (Signed)
Is noting some instability in knees especially when tired. Consider PT to strengthen the legs if the low back xray is OK. Topical gel or patches can help

## 2018-03-23 NOTE — Assessment & Plan Note (Signed)
Is following with chiropractic and that gives some relief but she is now starting to limit her movement or she cannot function. When her back gets bad her knees get weak and she is afraid she will fall. She has seen Dr Maureen Ralphs for her knees and he reported he thought it was her back. Has used ice packs and stretching and she gets some relief. Uses tylenol

## 2018-03-23 NOTE — Progress Notes (Signed)
Subjective:  I acted as a Education administrator for Dr. Charlett Blake. [name], [place]  Patient ID: Tammy Grimes, female    DOB: [birth year], 72 y.o.   MRN: [id number]  No chief complaint on file.   HPI  Patient is in today for 6 month follow up and her greatest complaints are musculoskeletal. She denies any falls, trauma or injury but her knee pain and chronic back pain are affecting her ability to complete her ADLs. No new or troubling incontinence. No recent febrile illness or hospitalizations. Denies CP/palp/SOB/HA/congestion/fevers/GI or GU c/o. Taking meds as prescribed  Patient Care Team: [name], MD as PCP - General (Family Medicine) Martinique, Amy, MD as Consulting Physician (Dermatology) Rutherford Guys, MD as Consulting Physician (Ophthalmology)   Past Medical History:  Diagnosis Date  . Allergy   . Anemia    prior to hysterectomy  . BCC (basal cell carcinoma) 08/01/2015  . Cancer ([place]) 2007   calf of right leg- melanoma  . Cataract   . Cervical cancer screening 06/06/2012  . DDD (degenerative disc disease), cervical 08/01/2015  . GERD (gastroesophageal reflux disease)   . Hyperglycemia 03/28/2017  . Hyperlipidemia   . Hypertension 65  . Knee pain 03/30/2012   L>R  . Low back pain 03/23/2018  . Medicare annual wellness visit, subsequent 02/09/2015  . Melanoma of skin ([place]) 08/19/2016  . Preventative health care 03/30/2012  . Thyroid disease 64  . Vitamin D deficiency 06/06/2012    Past Surgical History:  Procedure Laterality Date  . ABDOMINAL HYSTERECTOMY  1990's   total, for heavy bleeding and fibroids  . BASAL CELL CARCINOMA EXCISION    . BREAST SURGERY  early 70's   fibroid tumors removed- benign  . CATARACT EXTRACTION Bilateral 12/09/12  . COLONOSCOPY    . EYE SURGERY Bilateral 2015   cataracts  . MELANOMA EXCISION  2007    Family History  Problem Relation Age of Onset  . Hypertension Mother   . Alzheimer's disease Mother   . Dementia Mother        alzheimer's  . Aortic  aneurysm Father   . Hypertension Father   . COPD Father        smoker  . Heart disease Father        s/p bypass, aortic aneurysm, carotid artery disease  . Cancer Sister 4       breast- remission  . Diabetes Son 22       type 1  . Alzheimer's disease Maternal Grandmother   . Cancer Maternal Grandfather        prostate  . Stroke Paternal Grandfather   . Colon cancer Neg Hx   . Esophageal cancer Neg Hx   . Pancreatic cancer Neg Hx   . Rectal cancer Neg Hx   . Stomach cancer Neg Hx     Social History   Socioeconomic History  . Marital status: Married    Spouse name: Not on file  . Number of children: Not on file  . Years of education: Not on file  . Highest education level: Not on file  Occupational History  . Not on file  Social Needs  . Financial resource strain: Not on file  . Food insecurity:    Worry: Not on file    Inability: Not on file  . Transportation needs:    Medical: Not on file    Non-medical: Not on file  Tobacco Use  . Smoking status: Never Smoker  . Smokeless tobacco: Never  Used  Substance and Sexual Activity  . Alcohol use: No  . Drug use: No  . Sexual activity: Yes    Comment: lives with husband, retired from [employer] work, no dietary restrictions  Lifestyle  . Physical activity:    Days per week: Not on file    Minutes per session: Not on file  . Stress: Not on file  Relationships  . Social connections:    Talks on phone: Not on file    Gets together: Not on file    Attends religious service: Not on file    Active member of club or organization: Not on file    Attends meetings of clubs or organizations: Not on file    Relationship status: Not on file  . Intimate partner violence:    Fear of current or ex partner: Not on file    Emotionally abused: Not on file    Physically abused: Not on file    Forced sexual activity: Not on file  Other Topics Concern  . Not on file  Social History Narrative  . Not on file    Outpatient  Medications Prior to Visit  Medication Sig Dispense Refill  . amLODipine (NORVASC) 5 MG tablet TAKE 1 TABLET(5 MG) BY MOUTH DAILY 90 tablet 0  . aspirin 81 MG tablet Take 81 mg by mouth daily.    . calcium-vitamin D 250-100 MG-UNIT per tablet Take 1 tablet by mouth daily.    . Cholecalciferol (VITAMIN D3) 5000 UNITS CAPS Take 5,000 Units by mouth daily.    Marland Kitchen KRILL OIL PO Take 1 capsule by mouth daily.    . metoprolol tartrate (LOPRESSOR) 25 MG tablet Take 1/2 tablet (12.5mg  ) by mouth two times a day 90 tablet 3  . ranitidine (ZANTAC) 300 MG tablet Take 1 tablet (300 mg total) by mouth at bedtime. 90 tablet 1  . rosuvastatin (CRESTOR) 5 MG tablet Take 1 tablet (5 mg total) by mouth daily. Please call and schedule an appointment for further refills 1st attempt 90 tablet 0  . SYNTHROID 75 MCG tablet TAKE 1 TABLET(75 MCG) BY MOUTH DAILY BEFORE BREAKFAST 90 tablet 0  . omeprazole (PRILOSEC) 20 MG capsule TAKE 1 CAPSULE(20 MG) BY MOUTH DAILY 90 capsule 0  . 0.9 %  sodium chloride infusion      No facility-administered medications prior to visit.     Allergies  Allergen Reactions  . Penicillins Rash    Review of Systems  Constitutional: Negative for fever and malaise/fatigue.  HENT: Negative for congestion.   Eyes: Negative for blurred vision.  Respiratory: Negative for shortness of breath.   Cardiovascular: Negative for chest pain, palpitations and leg swelling.  Gastrointestinal: Negative for abdominal pain, blood in stool and nausea.  Genitourinary: Negative for dysuria and frequency.  Musculoskeletal: Positive for back pain and joint pain. Negative for falls.  Skin: Negative for rash.  Neurological: Negative for dizziness, loss of consciousness and headaches.  Endo/Heme/Allergies: Negative for environmental allergies.  Psychiatric/Behavioral: Negative for depression. The patient is not nervous/anxious.        Objective:    Physical Exam  Constitutional: She is oriented to  person, place, and time. No distress.  HENT:  Head: Normocephalic and atraumatic.  Eyes: Conjunctivae are normal.  Neck: Neck supple. No thyromegaly present.  Cardiovascular: Normal rate, regular rhythm and normal heart sounds.  No murmur heard. Pulmonary/Chest: Effort normal and breath sounds normal. She has no wheezes.  Abdominal: She exhibits no distension and no  mass.  Musculoskeletal: She exhibits no edema.  Lymphadenopathy:    She has no cervical adenopathy.  Neurological: She is alert and oriented to person, place, and time.  Skin: Skin is warm and dry. No rash noted. She is not diaphoretic.  Psychiatric: Judgment normal.    BP 120/78 (BP Location: Left Arm, Patient Position: Sitting, Cuff Size: Normal)   Pulse 81   Temp 98.1 F (36.7 C) (Oral)   Resp 18   Wt 146 lb 3.2 oz (66.3 kg)   SpO2 98%   BMI 26.74 kg/m  Wt Readings from Last 3 Encounters:  03/23/18 146 lb 3.2 oz (66.3 kg)  09/20/17 143 lb 12.8 oz (65.2 kg)  06/07/17 140 lb (63.5 kg)   BP Readings from Last 3 Encounters:  03/23/18 120/78  09/20/17 (!) 144/82  06/07/17 114/64     Immunization History  Administered Date(s) Administered  . Influenza Split 08/31/2012  . Influenza Whole 08/09/2011  . Influenza, High Dose Seasonal PF 08/19/2016  . Influenza,inj,Quad PF,6+ Mos 07/24/2013, 08/01/2015  . Influenza-Unspecified 07/24/2014, 08/22/2017  . Pneumococcal Conjugate-13 07/24/2013  . Pneumococcal Polysaccharide-23 08/01/2015  . Td 11/08/2005  . Tdap 01/30/2016  . Zoster 06/07/2012    Health Maintenance  Topic Date Due  . INFLUENZA VACCINE  06/08/2018  . MAMMOGRAM  08/23/2018  . TETANUS/TDAP  01/29/2026  . DEXA SCAN  Completed  . Hepatitis C Screening  Completed  . PNA vac Low Risk Adult  Completed    Lab Results  Component Value Date   WBC 8.4 03/20/2018   HGB 12.5 03/20/2018   HCT 38.1 03/20/2018   PLT 219.0 03/20/2018   GLUCOSE 115 (H) 03/20/2018   CHOL 102 03/20/2018   TRIG 142.0  03/20/2018   HDL 28.70 (L) 03/20/2018   LDLDIRECT 152.0 08/01/2015   LDLCALC 45 03/20/2018   ALT 21 03/20/2018   AST 14 03/20/2018   NA 143 03/20/2018   K 4.4 03/20/2018   CL 106 03/20/2018   CREATININE 0.67 03/20/2018   BUN 12 03/20/2018   CO2 29 03/20/2018   TSH 3.35 03/20/2018   HGBA1C 6.1 03/20/2018    Lab Results  Component Value Date   TSH 3.35 03/20/2018   Lab Results  Component Value Date   WBC 8.4 03/20/2018   HGB 12.5 03/20/2018   HCT 38.1 03/20/2018   MCV 90.3 03/20/2018   PLT 219.0 03/20/2018   Lab Results  Component Value Date   NA 143 03/20/2018   K 4.4 03/20/2018   CO2 29 03/20/2018   GLUCOSE 115 (H) 03/20/2018   BUN 12 03/20/2018   CREATININE 0.67 03/20/2018   BILITOT 0.6 03/20/2018   ALKPHOS 60 03/20/2018   AST 14 03/20/2018   ALT 21 03/20/2018   PROT 6.5 03/20/2018   ALBUMIN 4.2 03/20/2018   CALCIUM 9.3 03/20/2018   GFR 92.40 03/20/2018   Lab Results  Component Value Date   CHOL 102 03/20/2018   Lab Results  Component Value Date   HDL 28.70 (L) 03/20/2018   Lab Results  Component Value Date   LDLCALC 45 03/20/2018   Lab Results  Component Value Date   TRIG 142.0 03/20/2018   Lab Results  Component Value Date   CHOLHDL 4 03/20/2018   Lab Results  Component Value Date   HGBA1C 6.1 03/20/2018         Assessment & Plan:   Problem List Items Addressed This Visit    GERD (gastroesophageal reflux disease)    Avoid offending  foods, start probiotics. Do not eat large meals in late evening and consider raising head of bed.       Relevant Medications   omeprazole (PRILOSEC) 20 MG capsule   Essential hypertension    Well controlled, no changes to meds. Encouraged heart healthy diet such as the DASH diet and exercise as tolerated.       Hyperlipidemia    Tolerating statin, encouraged heart healthy diet, avoid trans fats, minimize simple carbs and saturated fats. Increase exercise as tolerated      Knee pain    Is noting  some instability in knees especially when tired. Consider PT to strengthen the legs if the low back xray is OK. Topical gel or patches can help      Hyperglycemia    hgba1c acceptable, minimize simple carbs. Increase exercise as tolerated. Continue current meds      Low back pain    Is following with chiropractic and that gives some relief but she is now starting to limit her movement or she cannot function. When her back gets bad her knees get weak and she is afraid she will fall. She has seen Dr Maureen Ralphs for her knees and he reported he thought it was her back. Has used ice packs and stretching and she gets some relief. Uses tylenol       Relevant Orders   DG Lumbar Spine Complete (Completed)      I have discontinued Olin Hauser K. Preston "[name]"'s omeprazole. I am also having her start on omeprazole. Additionally, I am having her maintain her aspirin, KRILL OIL PO, calcium-vitamin D, ranitidine, Vitamin D3, metoprolol tartrate, amLODipine, SYNTHROID, and rosuvastatin. We will stop administering sodium chloride.  Meds ordered this encounter  Medications  . omeprazole (PRILOSEC) 20 MG capsule    Sig: Take 1 capsule (20 mg total) by mouth daily.    Dispense:  90 capsule    Refill:  3    CMA served as scribe during this visit. History, Physical and Plan performed by medical provider. Documentation and orders reviewed and attested to.  Penni Homans, MD

## 2018-03-23 NOTE — Assessment & Plan Note (Signed)
Well controlled, no changes to meds. Encouraged heart healthy diet such as the DASH diet and exercise as tolerated.  °

## 2018-03-29 ENCOUNTER — Ambulatory Visit: Payer: Medicare Other | Admitting: *Deleted

## 2018-03-30 ENCOUNTER — Telehealth: Payer: Self-pay | Admitting: Family Medicine

## 2018-03-30 ENCOUNTER — Other Ambulatory Visit: Payer: Self-pay | Admitting: Family Medicine

## 2018-03-30 DIAGNOSIS — M545 Low back pain: Secondary | ICD-10-CM

## 2018-03-30 NOTE — Telephone Encounter (Signed)
Pt called back due to message left on 65/22/19;  Information given to pt per Dr Charlett Blake, " yMild degenerative changes no acute concerns. Confirm she wants referral to PT and where"; the pt verbalizes understanding; she would like a referral to be sent to [facility]; will route pt request to office for notification.

## 2018-04-10 DIAGNOSIS — M545 Low back pain: Secondary | ICD-10-CM | POA: Diagnosis not present

## 2018-04-11 DIAGNOSIS — M9903 Segmental and somatic dysfunction of lumbar region: Secondary | ICD-10-CM | POA: Diagnosis not present

## 2018-04-11 DIAGNOSIS — M5432 Sciatica, left side: Secondary | ICD-10-CM | POA: Diagnosis not present

## 2018-04-12 ENCOUNTER — Other Ambulatory Visit: Payer: Self-pay | Admitting: Internal Medicine

## 2018-04-13 MED ORDER — ROSUVASTATIN CALCIUM 5 MG PO TABS: ORAL_TABLET | ORAL | 0 refills | Status: DC

## 2018-04-13 NOTE — Addendum Note (Signed)
Addended by: Derl Barrow on: 04/13/2018 10:06 AM   Modules accepted: Orders

## 2018-04-14 DIAGNOSIS — M545 Low back pain: Secondary | ICD-10-CM | POA: Diagnosis not present

## 2018-04-17 DIAGNOSIS — M545 Low back pain: Secondary | ICD-10-CM | POA: Diagnosis not present

## 2018-04-20 ENCOUNTER — Telehealth: Payer: Self-pay | Admitting: *Deleted

## 2018-04-20 DIAGNOSIS — M545 Low back pain: Secondary | ICD-10-CM | POA: Diagnosis not present

## 2018-04-20 NOTE — Telephone Encounter (Signed)
Received Physician Orders from [place]; forwarded to provider/SLS 06/13

## 2018-04-24 DIAGNOSIS — M545 Low back pain: Secondary | ICD-10-CM | POA: Diagnosis not present

## 2018-04-28 DIAGNOSIS — M545 Low back pain: Secondary | ICD-10-CM | POA: Diagnosis not present

## 2018-05-02 DIAGNOSIS — M545 Low back pain: Secondary | ICD-10-CM | POA: Diagnosis not present

## 2018-05-04 DIAGNOSIS — M5432 Sciatica, left side: Secondary | ICD-10-CM | POA: Diagnosis not present

## 2018-05-04 DIAGNOSIS — M9903 Segmental and somatic dysfunction of lumbar region: Secondary | ICD-10-CM | POA: Diagnosis not present

## 2018-05-05 DIAGNOSIS — M545 Low back pain: Secondary | ICD-10-CM | POA: Diagnosis not present

## 2018-05-09 DIAGNOSIS — M545 Low back pain: Secondary | ICD-10-CM | POA: Diagnosis not present

## 2018-05-12 DIAGNOSIS — M545 Low back pain: Secondary | ICD-10-CM | POA: Diagnosis not present

## 2018-05-17 DIAGNOSIS — M545 Low back pain: Secondary | ICD-10-CM | POA: Diagnosis not present

## 2018-05-19 DIAGNOSIS — M545 Low back pain: Secondary | ICD-10-CM | POA: Diagnosis not present

## 2018-05-29 DIAGNOSIS — M5432 Sciatica, left side: Secondary | ICD-10-CM | POA: Diagnosis not present

## 2018-05-29 DIAGNOSIS — M9903 Segmental and somatic dysfunction of lumbar region: Secondary | ICD-10-CM | POA: Diagnosis not present

## 2018-05-31 DIAGNOSIS — M545 Low back pain: Secondary | ICD-10-CM | POA: Diagnosis not present

## 2018-06-02 DIAGNOSIS — M545 Low back pain: Secondary | ICD-10-CM | POA: Diagnosis not present

## 2018-06-06 DIAGNOSIS — M545 Low back pain: Secondary | ICD-10-CM | POA: Diagnosis not present

## 2018-06-09 ENCOUNTER — Other Ambulatory Visit: Payer: Self-pay

## 2018-06-21 DIAGNOSIS — M545 Low back pain: Secondary | ICD-10-CM | POA: Diagnosis not present

## 2018-06-22 ENCOUNTER — Encounter: Payer: Self-pay | Admitting: Family Medicine

## 2018-06-22 ENCOUNTER — Ambulatory Visit (INDEPENDENT_AMBULATORY_CARE_PROVIDER_SITE_OTHER): Payer: Medicare Other | Admitting: Family Medicine

## 2018-06-22 DIAGNOSIS — I10 Essential (primary) hypertension: Secondary | ICD-10-CM

## 2018-06-22 DIAGNOSIS — R739 Hyperglycemia, unspecified: Secondary | ICD-10-CM

## 2018-06-22 DIAGNOSIS — M25561 Pain in right knee: Secondary | ICD-10-CM | POA: Diagnosis not present

## 2018-06-22 DIAGNOSIS — E559 Vitamin D deficiency, unspecified: Secondary | ICD-10-CM | POA: Diagnosis not present

## 2018-06-22 DIAGNOSIS — E079 Disorder of thyroid, unspecified: Secondary | ICD-10-CM | POA: Diagnosis not present

## 2018-06-22 DIAGNOSIS — M25562 Pain in left knee: Secondary | ICD-10-CM | POA: Diagnosis not present

## 2018-06-22 DIAGNOSIS — M5030 Other cervical disc degeneration, unspecified cervical region: Secondary | ICD-10-CM | POA: Diagnosis not present

## 2018-06-22 DIAGNOSIS — E782 Mixed hyperlipidemia: Secondary | ICD-10-CM | POA: Diagnosis not present

## 2018-06-22 DIAGNOSIS — M8580 Other specified disorders of bone density and structure, unspecified site: Secondary | ICD-10-CM | POA: Diagnosis not present

## 2018-06-22 LAB — COMPREHENSIVE METABOLIC PANEL
ALT: 27 U/L (ref 0–35)
AST: 20 U/L (ref 0–37)
Albumin: 4.6 g/dL (ref 3.5–5.2)
Alkaline Phosphatase: 66 U/L (ref 39–117)
BUN: 14 mg/dL (ref 6–23)
CO2: 30 meq/L (ref 19–32)
Calcium: 10 mg/dL (ref 8.4–10.5)
Chloride: 104 mEq/L (ref 96–112)
Creatinine, Ser: 0.73 mg/dL (ref 0.40–1.20)
GFR: 83.63 mL/min (ref >60.00)
GLUCOSE: 105 mg/dL — AB (ref 70–99)
POTASSIUM: 4.5 meq/L (ref 3.5–5.1)
Sodium: 141 mEq/L (ref 135–145)
Total Bilirubin: 0.7 mg/dL (ref 0.2–1.2)
Total Protein: 7 g/dL (ref 6.0–8.3)

## 2018-06-22 LAB — CBC
HEMATOCRIT: 39 % (ref 36.0–46.0)
HEMOGLOBIN: 12.7 g/dL (ref 12.0–15.0)
MCHC: 32.5 g/dL (ref 30.0–36.0)
MCV: 89.9 fl (ref 78.0–100.0)
Platelets: 219 10*3/uL (ref 150.0–400.0)
RBC: 4.34 Mil/uL (ref 3.87–5.11)
RDW: 13.9 % (ref 11.5–15.5)
WBC: 7.7 10*3/uL (ref 4.0–10.5)

## 2018-06-22 LAB — TSH: TSH: 1.76 u[IU]/mL (ref 0.35–4.50)

## 2018-06-22 LAB — LIPID PANEL
CHOL/HDL RATIO: 3
Cholesterol: 124 mg/dL (ref 0–200)
HDL: 40.8 mg/dL (ref >39.00)
LDL CALC: 48 mg/dL (ref 0–99)
NONHDL: 83.38
Triglycerides: 179 mg/dL — ABNORMAL HIGH (ref 0.0–149.0)
VLDL: 35.8 mg/dL (ref 0.0–40.0)

## 2018-06-22 LAB — VITAMIN D 25 HYDROXY (VIT D DEFICIENCY, FRACTURES): VITD: 80.42 ng/mL (ref 30.00–100.00)

## 2018-06-22 LAB — HEMOGLOBIN A1C: HEMOGLOBIN A1C: 6.1 % (ref 4.6–6.5)

## 2018-06-22 MED ORDER — AMLODIPINE BESYLATE 5 MG PO TABS: ORAL_TABLET | ORAL | 1 refills | Status: DC

## 2018-06-22 MED ORDER — ROSUVASTATIN CALCIUM 5 MG PO TABS: ORAL_TABLET | ORAL | 1 refills | Status: DC

## 2018-06-22 MED ORDER — METOPROLOL TARTRATE 25 MG PO TABS: ORAL_TABLET | ORAL | 1 refills | Status: DC

## 2018-06-22 NOTE — Assessment & Plan Note (Signed)
B/l pain that is manageable but the biggest concern is the instability in the knees they feel so weak that she feels unstable and that she might fall. She is now working with OR PT due to her hip and sciatica on leftwhich is better now and so they are working strenghthening the muscles around the knees

## 2018-06-22 NOTE — Assessment & Plan Note (Signed)
Monitor and supplement 

## 2018-06-22 NOTE — Assessment & Plan Note (Signed)
Well controlled, no changes to meds. Encouraged heart healthy diet such as the DASH diet and exercise as tolerated.  °

## 2018-06-22 NOTE — Assessment & Plan Note (Signed)
Follows regularly with chiropractor and is doing well

## 2018-06-22 NOTE — Assessment & Plan Note (Signed)
Encouraged heart healthy diet, increase exercise, avoid trans fats, consider a krill oil cap daily 

## 2018-06-22 NOTE — Assessment & Plan Note (Signed)
Continue to monitor

## 2018-06-22 NOTE — Assessment & Plan Note (Signed)
Encouraged to get adequate exercise, calcium and vitamin d intake 

## 2018-06-22 NOTE — Assessment & Plan Note (Signed)
hgba1c acceptable, minimize simple carbs. Increase exercise as tolerated.  

## 2018-06-22 NOTE — Patient Instructions (Signed)

## 2018-06-22 NOTE — Progress Notes (Signed)
Subjective:  I acted as a Education administrator for Dr. Charlett Blake. Tammy Grimes, [place]  Patient ID: Tammy Grimes, female    DOB: 07-31-1946, 72 y.o.   MRN: [id number]  No chief complaint on file.   HPI  Patient is in today for 3 month follow up. She is following up on her HTN, hyperglycemia, hyperlipidemia and other medical concerns. She has no acute concerns. No recent febrile illness or acute hospitalizations. Denies CP/palp/SOB/HA/congestion/fevers/GI or GU c/o. Taking meds as prescribed. Her greatest concern is her knee instability. She hs some pain in her knees but it is manageable what she notes they have pain, get weak and feel they will buckle and mightcause her to fall. No recent injury or fall. No redness or warmth. She has trouble with her left hip as well but that has impoved some with chiopractic care. Denies CP/palp/SOB/HA/congestion/fevers/GI or GU c/o. Taking meds as prescribed   Patient Care Team: [name], MD as PCP - General (Family Medicine) Martinique, Amy, MD as Consulting Physician (Dermatology) Rutherford Guys, MD as Consulting Physician (Ophthalmology)   Past Medical History:  Diagnosis Date  . Allergy   . Anemia    prior to hysterectomy  . BCC (basal cell carcinoma) 08/01/2015  . Cancer ([facility]) 2007   calf of right leg- melanoma  . Cataract   . Cervical cancer screening 06/06/2012  . DDD (degenerative disc disease), cervical 08/01/2015  . GERD (gastroesophageal reflux disease)   . Hyperglycemia 03/28/2017  . Hyperlipidemia   . Hypertension 65  . Knee pain 03/30/2012   L>R  . Low back pain 03/23/2018  . Medicare annual wellness visit, subsequent 02/09/2015  . Melanoma of skin ([facility]) 08/19/2016  . Preventative health care 03/30/2012  . Thyroid disease 64  . Vitamin D deficiency 06/06/2012    Past Surgical History:  Procedure Laterality Date  . ABDOMINAL HYSTERECTOMY  1990's   total, for heavy bleeding and fibroids  . BASAL CELL CARCINOMA EXCISION    . BREAST SURGERY  early  70's   fibroid tumors removed- benign  . CATARACT EXTRACTION Bilateral 12/09/12  . COLONOSCOPY    . EYE SURGERY Bilateral 2015   cataracts  . MELANOMA EXCISION  2007    Family History  Problem Relation Age of Onset  . Hypertension Mother   . Alzheimer's disease Mother   . Dementia Mother        alzheimer's  . Aortic aneurysm Father   . Hypertension Father   . COPD Father        smoker  . Heart disease Father        s/p bypass, aortic aneurysm, carotid artery disease  . Cancer Sister 48       breast- remission  . Diabetes Son 22       type 1  . Alzheimer's disease Maternal Grandmother   . Cancer Maternal Grandfather        prostate  . Stroke Paternal Grandfather   . Colon cancer Neg Hx   . Esophageal cancer Neg Hx   . Pancreatic cancer Neg Hx   . Rectal cancer Neg Hx   . Stomach cancer Neg Hx     Social History   Socioeconomic History  . Marital status: Married    Spouse name: Not on file  . Number of children: Not on file  . Years of education: Not on file  . Highest education level: Not on file  Occupational History  . Not on file  Social Needs  .  Financial resource strain: Not on file  . Food insecurity:    Worry: Not on file    Inability: Not on file  . Transportation needs:    Medical: Not on file    Non-medical: Not on file  Tobacco Use  . Smoking status: Never Smoker  . Smokeless tobacco: Never Used  Substance and Sexual Activity  . Alcohol use: No  . Drug use: No  . Sexual activity: Yes    Comment: lives with husband, retired from [employer] work, no dietary restrictions  Lifestyle  . Physical activity:    Days per week: Not on file    Minutes per session: Not on file  . Stress: Not on file  Relationships  . Social connections:    Talks on phone: Not on file    Gets together: Not on file    Attends religious service: Not on file    Active member of club or organization: Not on file    Attends meetings of clubs or organizations: Not on  file    Relationship status: Not on file  . Intimate partner violence:    Fear of current or ex partner: Not on file    Emotionally abused: Not on file    Physically abused: Not on file    Forced sexual activity: Not on file  Other Topics Concern  . Not on file  Social History Narrative  . Not on file    Outpatient Medications Prior to Visit  Medication Sig Dispense Refill  . aspirin 81 MG tablet Take 81 mg by mouth daily.    . calcium-vitamin D 250-100 MG-UNIT per tablet Take 1 tablet by mouth daily.    . Cholecalciferol (VITAMIN D3) 5000 UNITS CAPS Take 5,000 Units by mouth daily.    Marland Kitchen KRILL OIL PO Take 1 capsule by mouth daily.    Marland Kitchen omeprazole (PRILOSEC) 20 MG capsule Take 1 capsule (20 mg total) by mouth daily. 90 capsule 3  . ranitidine (ZANTAC) 300 MG tablet Take 1 tablet (300 mg total) by mouth at bedtime. 90 tablet 1  . SYNTHROID 75 MCG tablet TAKE 1 TABLET(75 MCG) BY MOUTH DAILY BEFORE BREAKFAST 90 tablet 0  . amLODipine (NORVASC) 5 MG tablet TAKE 1 TABLET(5 MG) BY MOUTH DAILY 90 tablet 0  . metoprolol tartrate (LOPRESSOR) 25 MG tablet Take 1/2 tablet (12.5mg  ) by mouth two times a day 90 tablet 3  . rosuvastatin (CRESTOR) 5 MG tablet TAKE 1 TABLET(5 MG) BY MOUTH DAILY. Please make yearly appt with Dr. Saunders Revel for July before anymore refills. 1st attempt 90 tablet 0   No facility-administered medications prior to visit.     Allergies  Allergen Reactions  . Penicillins Rash    Review of Systems  Constitutional: Negative for fever and malaise/fatigue.  HENT: Negative for congestion.   Eyes: Negative for blurred vision.  Respiratory: Negative for shortness of breath.   Cardiovascular: Negative for chest pain, palpitations and leg swelling.  Gastrointestinal: Negative for abdominal pain, blood in stool and nausea.  Genitourinary: Negative for dysuria and frequency.  Musculoskeletal: Positive for joint pain. Negative for falls.  Skin: Negative for rash.  Neurological:  Positive for focal weakness. Negative for dizziness, loss of consciousness and headaches.  Endo/Heme/Allergies: Negative for environmental allergies.  Psychiatric/Behavioral: Negative for depression. The patient is not nervous/anxious.        Objective:    Physical Exam  Constitutional: She is oriented to person, place, and time. She appears well-developed and well-nourished.  No distress.  HENT:  Head: Normocephalic and atraumatic.  Eyes: Conjunctivae are normal.  Neck: Neck supple. No thyromegaly present.  Cardiovascular: Normal rate, regular rhythm and normal heart sounds.  No murmur heard. Pulmonary/Chest: Effort normal and breath sounds normal. No respiratory distress.  Abdominal: Soft. Bowel sounds are normal. She exhibits no distension and no mass. There is no tenderness.  Musculoskeletal: She exhibits no edema.  Lymphadenopathy:    She has no cervical adenopathy.  Neurological: She is alert and oriented to person, place, and time.  Skin: Skin is warm and dry.  Psychiatric: She has a normal mood and affect. Her behavior is normal.    BP 112/68 (BP Location: Left Arm, Patient Position: Sitting, Cuff Size: Normal)   Pulse 71   Temp 98.4 F (36.9 C) (Oral)   Resp 18   Ht 5' 1.5" (1.562 m)   Wt 148 lb (67.1 kg)   SpO2 98%   BMI 27.51 kg/m  Wt Readings from Last 3 Encounters:  06/22/18 148 lb (67.1 kg)  03/23/18 146 lb 3.2 oz (66.3 kg)  09/20/17 143 lb 12.8 oz (65.2 kg)   BP Readings from Last 3 Encounters:  06/22/18 112/68  03/23/18 120/78  09/20/17 (!) 144/82     Immunization History  Administered Date(s) Administered  . Influenza Split 08/31/2012  . Influenza Whole 08/09/2011  . Influenza, High Dose Seasonal PF 08/19/2016  . Influenza,inj,Quad PF,6+ Mos 07/24/2013, 08/01/2015  . Influenza-Unspecified 07/24/2014, 08/22/2017  . Pneumococcal Conjugate-13 07/24/2013  . Pneumococcal Polysaccharide-23 08/01/2015  . Td 11/08/2005  . Tdap 01/30/2016  . Zoster  06/07/2012    Health Maintenance  Topic Date Due  . INFLUENZA VACCINE  06/08/2018  . MAMMOGRAM  08/23/2018  . TETANUS/TDAP  01/29/2026  . DEXA SCAN  Completed  . Hepatitis C Screening  Completed  . PNA vac Low Risk Adult  Completed    Lab Results  Component Value Date   WBC 7.7 06/22/2018   HGB 12.7 06/22/2018   HCT 39.0 06/22/2018   PLT 219.0 06/22/2018   GLUCOSE 105 (H) 06/22/2018   CHOL 124 06/22/2018   TRIG 179.0 (H) 06/22/2018   HDL 40.80 06/22/2018   LDLDIRECT 152.0 08/01/2015   LDLCALC 48 06/22/2018   ALT 27 06/22/2018   AST 20 06/22/2018   NA 141 06/22/2018   K 4.5 06/22/2018   CL 104 06/22/2018   CREATININE 0.73 06/22/2018   BUN 14 06/22/2018   CO2 30 06/22/2018   TSH 1.76 06/22/2018   HGBA1C 6.1 06/22/2018    Lab Results  Component Value Date   TSH 1.76 06/22/2018   Lab Results  Component Value Date   WBC 7.7 06/22/2018   HGB 12.7 06/22/2018   HCT 39.0 06/22/2018   MCV 89.9 06/22/2018   PLT 219.0 06/22/2018   Lab Results  Component Value Date   NA 141 06/22/2018   K 4.5 06/22/2018   CO2 30 06/22/2018   GLUCOSE 105 (H) 06/22/2018   BUN 14 06/22/2018   CREATININE 0.73 06/22/2018   BILITOT 0.7 06/22/2018   ALKPHOS 66 06/22/2018   AST 20 06/22/2018   ALT 27 06/22/2018   PROT 7.0 06/22/2018   ALBUMIN 4.6 06/22/2018   CALCIUM 10.0 06/22/2018   GFR 83.63 06/22/2018   Lab Results  Component Value Date   CHOL 124 06/22/2018   Lab Results  Component Value Date   HDL 40.80 06/22/2018   Lab Results  Component Value Date   LDLCALC 48 06/22/2018  Lab Results  Component Value Date   TRIG 179.0 (H) 06/22/2018   Lab Results  Component Value Date   CHOLHDL 3 06/22/2018   Lab Results  Component Value Date   HGBA1C 6.1 06/22/2018         Assessment & Plan:   Problem List Items Addressed This Visit    Thyroid disease    Continue to monitor      Relevant Medications   metoprolol tartrate (LOPRESSOR) 25 MG tablet   Essential  hypertension    Well controlled, no changes to meds. Encouraged heart healthy diet such as the DASH diet and exercise as tolerated.       Relevant Medications   amLODipine (NORVASC) 5 MG tablet   rosuvastatin (CRESTOR) 5 MG tablet   metoprolol tartrate (LOPRESSOR) 25 MG tablet   Other Relevant Orders   CBC (Completed)   Comprehensive metabolic panel (Completed)   TSH (Completed)   CBC   Comprehensive metabolic panel   TSH   Ambulatory referral to Cardiology   Hyperlipidemia    Encouraged heart healthy diet, increase exercise, avoid trans fats, consider a krill oil cap daily      Relevant Medications   amLODipine (NORVASC) 5 MG tablet   rosuvastatin (CRESTOR) 5 MG tablet   metoprolol tartrate (LOPRESSOR) 25 MG tablet   Other Relevant Orders   Lipid panel (Completed)   Lipid panel   Ambulatory referral to Cardiology   Knee pain    B/l pain that is manageable but the biggest concern is the instability in the knees they feel so weak that she feels unstable and that she might fall. She is now working with OR PT due to her hip and sciatica on leftwhich is better now and so they are working strenghthening the muscles around the knees      Osteopenia    Encouraged to get adequate exercise, calcium and vitamin d intake      Vitamin D deficiency    Monitor and supplement      Relevant Orders   VITAMIN D 25 Hydroxy (Vit-D Deficiency, Fractures) (Completed)   VITAMIN D 25 Hydroxy (Vit-D Deficiency, Fractures)   DDD (degenerative disc disease), cervical    Follows regularly with chiropractor and is doing well      Hyperglycemia    hgba1c acceptable, minimize simple carbs. Increase exercise as tolerated.       Relevant Orders   Hemoglobin A1c (Completed)   Hemoglobin A1c   Ambulatory referral to Cardiology      I am having Gwenlyn Perking. Velie "[name]" maintain her aspirin, KRILL OIL PO, calcium-vitamin D, ranitidine, Vitamin D3, SYNTHROID, omeprazole, amLODipine, rosuvastatin,  and metoprolol tartrate.  Meds ordered this encounter  Medications  . amLODipine (NORVASC) 5 MG tablet    Sig: TAKE 1 TABLET(5 MG) BY MOUTH DAILY    Dispense:  90 tablet    Refill:  1  . rosuvastatin (CRESTOR) 5 MG tablet    Sig: TAKE 1 TABLET(5 MG) BY MOUTH DAILY. Please make yearly appt with Dr. Saunders Revel for July before anymore refills. 1st attempt    Dispense:  90 tablet    Refill:  1    Please call our office to schedule an yearly appointment with Dr. Saunders Revel for July before anymore refills. [phone]. Thank you 1st attempt  . metoprolol tartrate (LOPRESSOR) 25 MG tablet    Sig: Take 1/2 tablet (12.5mg  ) by mouth two times a day    Dispense:  90 tablet  Refill:  1    CMA served as Education administrator during this visit. History, Physical and Plan performed by medical provider. Documentation and orders reviewed and attested to.  Penni Homans, MD

## 2018-06-28 DIAGNOSIS — M545 Low back pain: Secondary | ICD-10-CM | POA: Diagnosis not present

## 2018-07-05 DIAGNOSIS — M545 Low back pain: Secondary | ICD-10-CM | POA: Diagnosis not present

## 2018-07-12 DIAGNOSIS — M545 Low back pain: Secondary | ICD-10-CM | POA: Diagnosis not present

## 2018-07-21 ENCOUNTER — Telehealth: Payer: Self-pay | Admitting: *Deleted

## 2018-07-21 NOTE — Telephone Encounter (Signed)
Received Physician Orders from [facility]; forwarded to provider/SLS 09/13

## 2018-07-22 ENCOUNTER — Other Ambulatory Visit: Payer: Self-pay | Admitting: Internal Medicine

## 2018-07-24 DIAGNOSIS — Z23 Encounter for immunization: Secondary | ICD-10-CM | POA: Diagnosis not present

## 2018-07-24 NOTE — Telephone Encounter (Signed)
Faxed signed [id number] order with fax confirmation/SLS 09/16

## 2018-07-25 DIAGNOSIS — M8589 Other specified disorders of bone density and structure, multiple sites: Secondary | ICD-10-CM | POA: Diagnosis not present

## 2018-07-28 ENCOUNTER — Telehealth: Payer: Self-pay | Admitting: *Deleted

## 2018-07-28 NOTE — Telephone Encounter (Signed)
Received Bone Density results from [facility]; forwarded to provider/SLS 09/20

## 2018-08-15 ENCOUNTER — Ambulatory Visit: Payer: Medicare Other | Admitting: Nurse Practitioner

## 2018-08-25 ENCOUNTER — Other Ambulatory Visit: Payer: Self-pay | Admitting: Internal Medicine

## 2018-08-25 NOTE — Telephone Encounter (Signed)
Please review for refill. Thanks!  

## 2018-09-11 DIAGNOSIS — Z1231 Encounter for screening mammogram for malignant neoplasm of breast: Secondary | ICD-10-CM | POA: Diagnosis not present

## 2018-09-11 DIAGNOSIS — Z803 Family history of malignant neoplasm of breast: Secondary | ICD-10-CM | POA: Diagnosis not present

## 2018-09-11 LAB — HM MAMMOGRAPHY

## 2018-09-12 ENCOUNTER — Other Ambulatory Visit: Payer: Self-pay | Admitting: Family Medicine

## 2018-09-14 ENCOUNTER — Telehealth: Payer: Self-pay | Admitting: *Deleted

## 2018-09-14 NOTE — Telephone Encounter (Signed)
Received Mammogram results requesting Additional Imaging Evaluation Needed from [facility]; forwarded to provider/SLS 11/07

## 2018-09-15 ENCOUNTER — Telehealth: Payer: Self-pay | Admitting: *Deleted

## 2018-09-15 NOTE — Telephone Encounter (Signed)
Received Physician Orders from [facility]; forwarded to provider/SLS 11/08

## 2018-09-18 DIAGNOSIS — Z85828 Personal history of other malignant neoplasm of skin: Secondary | ICD-10-CM | POA: Diagnosis not present

## 2018-09-18 DIAGNOSIS — D485 Neoplasm of uncertain behavior of skin: Secondary | ICD-10-CM | POA: Diagnosis not present

## 2018-09-18 DIAGNOSIS — L820 Inflamed seborrheic keratosis: Secondary | ICD-10-CM | POA: Diagnosis not present

## 2018-09-18 DIAGNOSIS — L814 Other melanin hyperpigmentation: Secondary | ICD-10-CM | POA: Diagnosis not present

## 2018-09-18 DIAGNOSIS — L570 Actinic keratosis: Secondary | ICD-10-CM | POA: Diagnosis not present

## 2018-09-18 DIAGNOSIS — D225 Melanocytic nevi of trunk: Secondary | ICD-10-CM | POA: Diagnosis not present

## 2018-09-18 DIAGNOSIS — D2239 Melanocytic nevi of other parts of face: Secondary | ICD-10-CM | POA: Diagnosis not present

## 2018-09-18 DIAGNOSIS — L821 Other seborrheic keratosis: Secondary | ICD-10-CM | POA: Diagnosis not present

## 2018-09-21 DIAGNOSIS — N6310 Unspecified lump in the right breast, unspecified quadrant: Secondary | ICD-10-CM | POA: Diagnosis not present

## 2018-09-21 DIAGNOSIS — N6314 Unspecified lump in the right breast, lower inner quadrant: Secondary | ICD-10-CM | POA: Diagnosis not present

## 2018-09-21 LAB — HM MAMMOGRAPHY: HM Mammogram: ABNORMAL — AB (ref 0–4)

## 2018-09-22 ENCOUNTER — Telehealth: Payer: Self-pay | Admitting: *Deleted

## 2018-09-22 NOTE — Telephone Encounter (Signed)
Received Physician Orders for Biopsy from [facility]; forwarded to provider/SLS 11/15

## 2018-09-25 DIAGNOSIS — C50919 Malignant neoplasm of unspecified site of unspecified female breast: Secondary | ICD-10-CM

## 2018-09-25 HISTORY — DX: Malignant neoplasm of unspecified site of unspecified female breast: C50.919

## 2018-09-25 NOTE — Progress Notes (Addendum)
Subjective:   Tammy Grimes is a 72 y.o. female who presents for Medicare Annual (Subsequent) preventive examination.  Pt states she was received dx breast cancer last week. States she feels overwhelmed, but has a great support system.  Review of Systems: No ROS.  Medicare Wellness Visit. Additional risk factors are reflected in the social history. Cardiac Risk Factors include: advanced age (>27men, >21 women);dyslipidemia;hypertension Sleep patterns: new dx of breast cancer causing some disturbance Home Safety/Smoke Alarms: Feels safe in home. Smoke alarms in place. Lives with husband in 1 story home.     Female:        Mammo- pt reports last was 2 weeks ago with ultrasound at [facility]. Awaiting report.       Dexa scan-utd        CCS- No longer doing routine screening due to age.     Objective:     Vitals: BP 140/80 (BP Location: Left Arm, Patient Position: Sitting, Cuff Size: Normal)   Pulse 65   Ht 5\' 2"  (1.575 m)   Wt 144 lb 12.8 oz (65.7 kg)   SpO2 96%   BMI 26.48 kg/m   Body mass index is 26.48 kg/m.  Advanced Directives 09/26/2018 05/24/2017 03/28/2017 01/28/2015  Does Patient Have a Medical Advance Directive? Yes - Yes Yes  Type of Paramedic of [place];Living will [place];Living will [place];Living will [place];Living will  Does patient want to make changes to medical advance directive? - - No - Patient declined No - Patient declined  Copy of [place] in Chart? Yes - validated most recent copy scanned in chart (See row information) - Yes No - copy requested    Tobacco Social History   Tobacco Use  Smoking Status Never Smoker  Smokeless Tobacco Never Used     Counseling given: Not Answered   Clinical Intake: Pain : No/denies pain      Past Medical History:  Diagnosis Date  . Allergy   . Anemia    prior to hysterectomy  . BCC (basal cell  carcinoma) 08/01/2015  . Breast cancer ([facility]) 09/25/2018   right  . Cancer ([facility]) 2007   calf of right leg- melanoma  . Cataract   . Cervical cancer screening 06/06/2012  . DDD (degenerative disc disease), cervical 08/01/2015  . GERD (gastroesophageal reflux disease)   . Hyperglycemia 03/28/2017  . Hyperlipidemia   . Hypertension 65  . Knee pain 03/30/2012   L>R  . Low back pain 03/23/2018  . Medicare annual wellness visit, subsequent 02/09/2015  . Melanoma of skin ([facility]) 08/19/2016  . Preventative health care 03/30/2012  . Thyroid disease 64  . Vitamin D deficiency 06/06/2012   Past Surgical History:  Procedure Laterality Date  . ABDOMINAL HYSTERECTOMY  1990's   total, for heavy bleeding and fibroids  . BASAL CELL CARCINOMA EXCISION    . BREAST SURGERY  early 70's   fibroid tumors removed- benign  . CATARACT EXTRACTION Bilateral 12/09/12  . COLONOSCOPY    . EYE SURGERY Bilateral 2015   cataracts  . MELANOMA EXCISION  2007   Family History  Problem Relation Age of Onset  . Hypertension Mother   . Alzheimer's disease Mother   . Dementia Mother        alzheimer's  . Aortic aneurysm Father   . Hypertension Father   . COPD Father        smoker  . Heart disease  Father        s/p bypass, aortic aneurysm, carotid artery disease  . Cancer Sister 60       breast- remission  . Diabetes Son 22       type 1  . Alzheimer's disease Maternal Grandmother   . Cancer Maternal Grandfather        prostate  . Stroke Paternal Grandfather   . Colon cancer Neg Hx   . Esophageal cancer Neg Hx   . Pancreatic cancer Neg Hx   . Rectal cancer Neg Hx   . Stomach cancer Neg Hx    Social History   Socioeconomic History  . Marital status: Married    Spouse name: Not on file  . Number of children: Not on file  . Years of education: Not on file  . Highest education level: Not on file  Occupational History  . Not on file  Social Needs  . Financial resource strain: Not on file  . Food insecurity:     Worry: Not on file    Inability: Not on file  . Transportation needs:    Medical: Not on file    Non-medical: Not on file  Tobacco Use  . Smoking status: Never Smoker  . Smokeless tobacco: Never Used  Substance and Sexual Activity  . Alcohol use: No  . Drug use: No  . Sexual activity: Yes    Comment: lives with husband, retired from [employer] work, no dietary restrictions  Lifestyle  . Physical activity:    Days per week: Not on file    Minutes per session: Not on file  . Stress: Not on file  Relationships  . Social connections:    Talks on phone: Not on file    Gets together: Not on file    Attends religious service: Not on file    Active member of club or organization: Not on file    Attends meetings of clubs or organizations: Not on file    Relationship status: Not on file  Other Topics Concern  . Not on file  Social History Narrative  . Not on file    Outpatient Encounter Medications as of 09/26/2018  Medication Sig  . amLODipine (NORVASC) 5 MG tablet TAKE 1 TABLET(5 MG) BY MOUTH DAILY  . aspirin 81 MG tablet Take 81 mg by mouth daily.  . calcium-vitamin D 250-100 MG-UNIT per tablet Take 1 tablet by mouth daily.  . Cholecalciferol (VITAMIN D3) 5000 UNITS CAPS Take 5,000 Units by mouth daily.  Marland Kitchen KRILL OIL PO Take 1 capsule by mouth daily.  . metoprolol tartrate (LOPRESSOR) 25 MG tablet Take 1/2 tablet (12.5mg  ) by mouth two times a day  . omeprazole (PRILOSEC) 20 MG capsule Take 1 capsule (20 mg total) by mouth daily.  . ranitidine (ZANTAC) 300 MG tablet Take 1 tablet (300 mg total) by mouth at bedtime.  . rosuvastatin (CRESTOR) 5 MG tablet TAKE 1 TABLET(5 MG) BY MOUTH DAILY. Please make yearly appt with Dr. Saunders Revel for July before anymore refills. 1st attempt  . SYNTHROID 75 MCG tablet TAKE 1 TABLET(75 MCG) BY MOUTH DAILY BEFORE BREAKFAST  . [DISCONTINUED] metoprolol tartrate (LOPRESSOR) 25 MG tablet TAKE 1/2 TABLET(12.5 MG) BY MOUTH TWICE DAILY   No  facility-administered encounter medications on file as of 09/26/2018.     Activities of Daily Living In your present state of health, do you have any difficulty performing the following activities: 09/26/2018  Hearing? N  Vision? N  Difficulty concentrating or  making decisions? N  Walking or climbing stairs? N  Dressing or bathing? N  Doing errands, shopping? N  Preparing Food and eating ? N  Using the Toilet? N  In the past six months, have you accidently leaked urine? N  Do you have problems with loss of bowel control? N  Managing your Medications? N  Managing your Finances? N  Housekeeping or managing your Housekeeping? N  Some recent data might be hidden    Patient Care Team: [name], MD as PCP - General (Family Medicine) Martinique, Amy, MD as Consulting Physician (Dermatology) Rutherford Guys, MD as Consulting Physician (Ophthalmology)    Assessment:   This is a routine wellness examination for Tammy Grimes. Physical assessment deferred to PCP.  Exercise Activities and Dietary recommendations Current Exercise Habits: Home exercise routine, Time (Minutes): 60, Frequency (Times/Week): 2, Weekly Exercise (Minutes/Week): 120, Exercise limited by: None identified Diet (meal preparation, eat out, water intake, caffeinated beverages, dairy products, fruits and vegetables): in general, an "unhealthy" diet, on average, 2 meals per day    Goals    . Begin exercising again at least 2x/week (pt-stated)       Fall Risk Fall Risk  09/26/2018 06/22/2018 06/09/2018 03/28/2017 01/30/2016  Falls in the past year? - No No No Yes  Comment - - Emmi Telephone Survey: data to providers prior to load - -  Number falls in past yr: 0 - - - 1  Injury with Fall? - - - - Yes  Comment - - - - dislocated her right knee  Follow up - - - - Falls evaluation completed;Follow up appointment  Comment - - - - patient was seen in the ER    Depression Screen PHQ 2/9 Scores 09/26/2018 03/28/2017 01/30/2016  01/28/2015  PHQ - 2 Score 0 0 0 0     Cognitive Function Ad8 score reviewed for issues:  Issues making decisions:no  Less interest in hobbies / activities:no  Repeats questions, stories (family complaining):no  Trouble using ordinary gadgets (microwave, computer, phone):no  Forgets the month or year: no  Mismanaging finances: no  Remembering appts:no  Daily problems with thinking and/or memory:no Ad8 score is=0        Immunization History  Administered Date(s) Administered  . Influenza Split 08/31/2012  . Influenza Whole 08/09/2011  . Influenza, High Dose Seasonal PF 08/19/2016  . Influenza,inj,Quad PF,6+ Mos 07/24/2013, 08/01/2015  . Influenza-Unspecified 07/24/2014, 08/22/2017  . Pneumococcal Conjugate-13 07/24/2013  . Pneumococcal Polysaccharide-23 08/01/2015  . Td 11/08/2005  . Tdap 01/30/2016  . Zoster 06/07/2012    Screening Tests Health Maintenance  Topic Date Due  . MAMMOGRAM  08/23/2018  . TETANUS/TDAP  01/29/2026  . INFLUENZA VACCINE  Completed  . DEXA SCAN  Completed  . Hepatitis C Screening  Completed  . PNA vac Low Risk Adult  Completed      Plan:    Please schedule your next medicare wellness visit with me in 1 yr.  Continue to eat heart healthy diet (full of fruits, vegetables, whole grains, lean protein, water--limit salt, fat, and sugar intake) and increase physical activity as tolerated.   I have personally reviewed and noted the following in the patient's chart:   . Medical and social history . Use of alcohol, tobacco or illicit drugs  . Current medications and supplements . Functional ability and status . Nutritional status . Physical activity . Advanced directives . List of other physicians . Hospitalizations, surgeries, and ER visits in previous 12 months . Vitals .  Screenings to include cognitive, depression, and falls . Referrals and appointments  In addition, I have reviewed and discussed with patient certain preventive  protocols, quality metrics, and best practice recommendations. A written personalized care plan for preventive services as well as general preventive health recommendations were provided to patient.     Shela Nevin, [place]  09/26/2018  Medical screening examination/treatment was performed by qualified clinical staff member and as supervising physician I was immediately available for consultation/collaboration. I have reviewed documentation and agree with assessment and plan.  Penni Homans, MD

## 2018-09-26 ENCOUNTER — Encounter: Payer: Self-pay | Admitting: Family Medicine

## 2018-09-26 ENCOUNTER — Encounter: Payer: Self-pay | Admitting: *Deleted

## 2018-09-26 ENCOUNTER — Ambulatory Visit (INDEPENDENT_AMBULATORY_CARE_PROVIDER_SITE_OTHER): Payer: Medicare Other | Admitting: *Deleted

## 2018-09-26 VITALS — BP 140/80 | HR 65 | Ht 62.0 in | Wt 144.8 lb

## 2018-09-26 DIAGNOSIS — Z0000 Encounter for general adult medical examination without abnormal findings: Secondary | ICD-10-CM | POA: Diagnosis not present

## 2018-09-26 MED ORDER — SYNTHROID 75 MCG PO TABS: ORAL_TABLET | ORAL | 0 refills | Status: DC

## 2018-09-26 NOTE — Patient Instructions (Signed)
Please schedule your next medicare wellness visit with me in 1 yr.  Continue to eat heart healthy diet (full of fruits, vegetables, whole grains, lean protein, water--limit salt, fat, and sugar intake) and increase physical activity as tolerated.   Tammy Grimes , Thank you for taking time to come for your Medicare Wellness Visit. I appreciate your ongoing commitment to your health goals. Please review the following plan we discussed and let me know if I can assist you in the future.   These are the goals we discussed: Goals    . Begin exercising again at least 2x/week (pt-stated)       This is a list of the screening recommended for you and due dates:  Health Maintenance  Topic Date Due  . Mammogram  08/23/2018  . Tetanus Vaccine  01/29/2026  . Flu Shot  Completed  . DEXA scan (bone density measurement)  Completed  .  Hepatitis C: One time screening is recommended by Center for Disease Control  (CDC) for  adults born from [birth year] through 1963.   Completed  . Pneumonia vaccines  Completed    Health Maintenance for Postmenopausal Women Menopause is a normal process in which your reproductive ability comes to an end. This process happens gradually over a span of months to years, usually between the ages of 62 and 62. Menopause is complete when you have missed 12 consecutive menstrual periods. It is important to talk with your health care provider about some of the most common conditions that affect postmenopausal women, such as heart disease, cancer, and bone loss (osteoporosis). Adopting a healthy lifestyle and getting preventive care can help to promote your health and wellness. Those actions can also lower your chances of developing some of these common conditions. What should I know about menopause? During menopause, you may experience a number of symptoms, such as:  Moderate-to-severe hot flashes.  Night sweats.  Decrease in sex drive.  Mood  swings.  Headaches.  Tiredness.  Irritability.  Memory problems.  Insomnia.  Choosing to treat or not to treat menopausal changes is an individual decision that you make with your health care provider. What should I know about hormone replacement therapy and supplements? Hormone therapy products are effective for treating symptoms that are associated with menopause, such as hot flashes and night sweats. Hormone replacement carries certain risks, especially as you become older. If you are thinking about using estrogen or estrogen with progestin treatments, discuss the benefits and risks with your health care provider. What should I know about heart disease and stroke? Heart disease, heart attack, and stroke become more likely as you age. This may be due, in part, to the hormonal changes that your body experiences during menopause. These can affect how your body processes dietary fats, triglycerides, and cholesterol. Heart attack and stroke are both medical emergencies. There are many things that you can do to help prevent heart disease and stroke:  Have your blood pressure checked at least every 1-2 years. High blood pressure causes heart disease and increases the risk of stroke.  If you are 34-66 years old, ask your health care provider if you should take aspirin to prevent a heart attack or a stroke.  Do not use any tobacco products, including cigarettes, chewing tobacco, or electronic cigarettes. If you need help quitting, ask your health care provider.  It is important to eat a healthy diet and maintain a healthy weight. ? Be sure to include plenty of vegetables, fruits, low-fat dairy products,  and lean protein. ? Avoid eating foods that are high in solid fats, added sugars, or salt (sodium).  Get regular exercise. This is one of the most important things that you can do for your health. ? Try to exercise for at least 150 minutes each week. The type of exercise that you do should  increase your heart rate and make you sweat. This is known as moderate-intensity exercise. ? Try to do strengthening exercises at least twice each week. Do these in addition to the moderate-intensity exercise.  Know your numbers.Ask your health care provider to check your cholesterol and your blood glucose. Continue to have your blood tested as directed by your health care provider.  What should I know about cancer screening? There are several types of cancer. Take the following steps to reduce your risk and to catch any cancer development as early as possible. Breast Cancer  Practice breast self-awareness. ? This means understanding how your breasts normally appear and feel. ? It also means doing regular breast self-exams. Let your health care provider know about any changes, no matter how small.  If you are 39 or older, have a clinician do a breast exam (clinical breast exam or CBE) every year. Depending on your age, family history, and medical history, it may be recommended that you also have a yearly breast X-ray (mammogram).  If you have a family history of breast cancer, talk with your health care provider about genetic screening.  If you are at high risk for breast cancer, talk with your health care provider about having an MRI and a mammogram every year.  Breast cancer (BRCA) gene test is recommended for women who have family members with BRCA-related cancers. Results of the assessment will determine the need for genetic counseling and BRCA1 and for BRCA2 testing. BRCA-related cancers include these types: ? Breast. This occurs in males or females. ? Ovarian. ? Tubal. This may also be called fallopian tube cancer. ? Cancer of the abdominal or pelvic lining (peritoneal cancer). ? Prostate. ? Pancreatic.  Cervical, Uterine, and Ovarian Cancer Your health care provider may recommend that you be screened regularly for cancer of the pelvic organs. These include your ovaries, uterus,  and vagina. This screening involves a pelvic exam, which includes checking for microscopic changes to the surface of your cervix (Pap test).  For women ages 21-65, health care providers may recommend a pelvic exam and a Pap test every three years. For women ages 75-65, they may recommend the Pap test and pelvic exam, combined with testing for human papilloma virus (HPV), every five years. Some types of HPV increase your risk of cervical cancer. Testing for HPV may also be done on women of any age who have unclear Pap test results.  Other health care providers may not recommend any screening for nonpregnant women who are considered low risk for pelvic cancer and have no symptoms. Ask your health care provider if a screening pelvic exam is right for you.  If you have had past treatment for cervical cancer or a condition that could lead to cancer, you need Pap tests and screening for cancer for at least 20 years after your treatment. If Pap tests have been discontinued for you, your risk factors (such as having a new sexual partner) need to be reassessed to determine if you should start having screenings again. Some women have medical problems that increase the chance of getting cervical cancer. In these cases, your health care provider may recommend that you  have screening and Pap tests more often.  If you have a family history of uterine cancer or ovarian cancer, talk with your health care provider about genetic screening.  If you have vaginal bleeding after reaching menopause, tell your health care provider.  There are currently no reliable tests available to screen for ovarian cancer.  Lung Cancer Lung cancer screening is recommended for adults 5-43 years old who are at high risk for lung cancer because of a history of smoking. A yearly low-dose CT scan of the lungs is recommended if you:  Currently smoke.  Have a history of at least 30 pack-years of smoking and you currently smoke or have quit  within the past 15 years. A pack-year is smoking an average of one pack of cigarettes per day for one year.  Yearly screening should:  Continue until it has been 15 years since you quit.  Stop if you develop a health problem that would prevent you from having lung cancer treatment.  Colorectal Cancer  This type of cancer can be detected and can often be prevented.  Routine colorectal cancer screening usually begins at age 64 and continues through age 16.  If you have risk factors for colon cancer, your health care provider may recommend that you be screened at an earlier age.  If you have a family history of colorectal cancer, talk with your health care provider about genetic screening.  Your health care provider may also recommend using home test kits to check for hidden blood in your stool.  A small camera at the end of a tube can be used to examine your colon directly (sigmoidoscopy or colonoscopy). This is done to check for the earliest forms of colorectal cancer.  Direct examination of the colon should be repeated every 5-10 years until age 32. However, if early forms of precancerous polyps or small growths are found or if you have a family history or genetic risk for colorectal cancer, you may need to be screened more often.  Skin Cancer  Check your skin from head to toe regularly.  Monitor any moles. Be sure to tell your health care provider: ? About any new moles or changes in moles, especially if there is a change in a mole's shape or color. ? If you have a mole that is larger than the size of a pencil eraser.  If any of your family members has a history of skin cancer, especially at a young age, talk with your health care provider about genetic screening.  Always use sunscreen. Apply sunscreen liberally and repeatedly throughout the day.  Whenever you are outside, protect yourself by wearing long sleeves, pants, a wide-brimmed hat, and sunglasses.  What should I know  about osteoporosis? Osteoporosis is a condition in which bone destruction happens more quickly than new bone creation. After menopause, you may be at an increased risk for osteoporosis. To help prevent osteoporosis or the bone fractures that can happen because of osteoporosis, the following is recommended:  If you are 76-25 years old, get at least 1,000 mg of calcium and at least 600 mg of vitamin D per day.  If you are older than age 59 but younger than age 66, get at least 1,200 mg of calcium and at least 600 mg of vitamin D per day.  If you are older than age 31, get at least 1,200 mg of calcium and at least 800 mg of vitamin D per day.  Smoking and excessive alcohol intake increase the  risk of osteoporosis. Eat foods that are rich in calcium and vitamin D, and do weight-bearing exercises several times each week as directed by your health care provider. What should I know about how menopause affects my mental health? Depression may occur at any age, but it is more common as you become older. Common symptoms of depression include:  Low or sad mood.  Changes in sleep patterns.  Changes in appetite or eating patterns.  Feeling an overall lack of motivation or enjoyment of activities that you previously enjoyed.  Frequent crying spells.  Talk with your health care provider if you think that you are experiencing depression. What should I know about immunizations? It is important that you get and maintain your immunizations. These include:  Tetanus, diphtheria, and pertussis (Tdap) booster vaccine.  Influenza every year before the flu season begins.  Pneumonia vaccine.  Shingles vaccine.  Your health care provider may also recommend other immunizations. This information is not intended to replace advice given to you by your health care provider. Make sure you discuss any questions you have with your health care provider. Document Released: 12/17/2005 Document Revised: 05/14/2016  Document Reviewed: 07/29/2015 Elsevier Interactive Patient Education  2018 [employer].

## 2018-09-27 ENCOUNTER — Other Ambulatory Visit: Payer: Self-pay | Admitting: Radiology

## 2018-09-27 DIAGNOSIS — C50811 Malignant neoplasm of overlapping sites of right female breast: Secondary | ICD-10-CM | POA: Diagnosis not present

## 2018-10-03 ENCOUNTER — Telehealth: Payer: Self-pay | Admitting: Oncology

## 2018-10-03 NOTE — Telephone Encounter (Signed)
Spoke with patient to confirm afternoon, [facility] appointment on 12/4, [facility] patient appointment reminder sent

## 2018-10-04 ENCOUNTER — Encounter: Payer: Self-pay | Admitting: *Deleted

## 2018-10-04 DIAGNOSIS — Z170 Estrogen receptor positive status [ER+]: Principal | ICD-10-CM

## 2018-10-04 DIAGNOSIS — C50511 Malignant neoplasm of lower-outer quadrant of right female breast: Secondary | ICD-10-CM

## 2018-10-10 NOTE — Progress Notes (Signed)
Tammy Grimes  Telephone:([phone]) [phone] Fax:([phone]) [phone]     ID: Tammy Grimes DOB: [birth year]  MR#: [id number]  ULA#:[id number]  Patient Care Team: [name], MD as PCP - General (Family Medicine) Martinique, Amy, MD as Consulting Physician (Dermatology) Rutherford Guys, MD as Consulting Physician (Ophthalmology) [name], MD as Consulting Physician (General Surgery) Errika Narvaiz, Virgie Dad, MD as Consulting Physician (Oncology) Kyung Rudd, MD as Consulting Physician (Radiation Oncology) Gatha Mayer, MD as Consulting Physician (Gastroenterology) OTHER MD: Dr. Purcell Nails, Dr. Sharyon Medicus DDS PA   CHIEF COMPLAINT: Estrogen receptor positive breast cancer   CURRENT TREATMENT: Awaiting definitive surgery  HISTORY OF CURRENT ILLNESS: "Tammy Grimes" had routine screening mammography on 09/11/2018 showing a possible abnormality in the right breast. She underwent right unilateral diagnostic mammography with tomography and right breast ultrasonography at [facility] on 09/21/2018 showing: Breast Density Category C. 1.3 cm irregular focal asymmetry in the right breast lower inner aspect anterior depth 4 cm from the nipple noted on the mammogram, but on the ultrasound this measured at [2.8] cm. The irregular mass is hypoechoic with posterior acoustic shadowing. Color flow imaging demonstrates increased vascularity. Elastography imaging assessment is hard. No significant abnormalities were seen in the right axilla.   Accordingly on 09/28/2018 she proceeded to biopsy of the right breast area in question. The pathology from this procedure showed ([id number]): invasive ductal carcinoma, Grade III. Prognostic indicators significant for: estrogen receptor, 95% positive and progesterone receptor, 95% positive, both with strong staining intensity. Proliferation marker Ki67 at 15%. HER2 negative immunohistochemcal and morphometric analysis 1+.  The patient's subsequent history is as detailed  below.   INTERVAL HISTORY: Tammy Grimes was evaluated in the multidisciplinary breast cancer clinic on 10/11/2018 accompanied by her husband, Tammy Grimes, and her sister, Tammy Grimes. Her case was also presented at the multidisciplinary breast cancer conference on the same day. At that time a preliminary plan was proposed: Breast conserving surgery with sentinel lymph node sampling, Oncotype, genetics, adjuvant radiation, antiestrogens.  MRI to be considered.   REVIEW OF SYSTEMS: There were no specific symptoms leading to the original mammogram, which was routinely scheduled. The patient denies unusual headaches, visual changes, nausea, vomiting, stiff neck, dizziness, or gait imbalance. There has been no cough, phlegm production, or pleurisy, no chest pain or pressure, and no change in bowel or bladder habits. The patient denies fever, rash, bleeding, unexplained fatigue or unexplained weight loss.  She does not exercise regularly.  A detailed review of systems was otherwise entirely negative.   PAST MEDICAL HISTORY: Past Medical History:  Diagnosis Date  . Allergy   . Anemia    prior to hysterectomy  . BCC (basal cell carcinoma) 08/01/2015  . Breast cancer ([place]) 09/25/2018   right  . Cancer ([place]) 2007   calf of right leg- melanoma  . Cataract   . Cervical cancer screening 06/06/2012  . DDD (degenerative disc disease), cervical 08/01/2015  . GERD (gastroesophageal reflux disease)   . Hyperglycemia 03/28/2017  . Hyperlipidemia   . Hypertension 65  . Knee pain 03/30/2012   L>R  . Low back pain 03/23/2018  . Medicare annual wellness visit, subsequent 02/09/2015  . Melanoma of skin ([place]) 08/19/2016  . Preventative health care 03/30/2012  . Thyroid disease 64  . Vitamin D deficiency 06/06/2012     PAST SURGICAL HISTORY: Past Surgical History:  Procedure Laterality Date  . ABDOMINAL HYSTERECTOMY  1990's   total, for heavy bleeding and fibroids  . BASAL CELL CARCINOMA EXCISION    .  BREAST SURGERY  early  70's   fibroid tumors removed- benign  . CATARACT EXTRACTION Bilateral 12/09/12  . COLONOSCOPY    . EYE SURGERY Bilateral 2015   cataracts  . MELANOMA EXCISION  2007     FAMILY HISTORY Family History  Problem Relation Age of Onset  . Hypertension Mother   . Alzheimer's disease Mother   . Dementia Mother        alzheimer's  . Aortic aneurysm Father   . Hypertension Father   . COPD Father        smoker  . Heart disease Father        s/p bypass, aortic aneurysm, carotid artery disease  . Cancer Sister 45       breast- remission  . Diabetes Son 22       type 1  . Alzheimer's disease Maternal Grandmother   . Cancer Maternal Grandfather        prostate  . Stroke Paternal Grandfather   . Ovarian cancer Maternal Aunt   . Cervical cancer Maternal Aunt   . Colon cancer Neg Hx   . Esophageal cancer Neg Hx   . Pancreatic cancer Neg Hx   . Rectal cancer Neg Hx   . Stomach cancer Neg Hx    Her father died from COPD at age 64. Patients' mother died from alzheimer's at age 52. The patient has no brothers, 1 sister. Her sister, Tammy Grimes, had breast cancer at the age of 56.  Tammy Grimes, is employed with the [employer].One of their maternal aunts had ovarian cancer, and a second maternal aunt had either uterine or cervical cancer. Tammy Grimes had Melanoma in situ on her right calf.   GYNECOLOGIC HISTORY:  Menarche: 72 years old Age at first live birth: 72 years old GX P: 1 LMP: No LMP recorded. Patient has had a hysterectomy. 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 Her husband, Tammy Grimes, worked as a English as a second language teacher for an Retail buyer.  He has now retired.  Tammy Grimes has one son, Tammy Grimes, age 72, who lives in [place] and  is a Advertising copywriter as well as a Art gallery manager.     ADVANCED DIRECTIVES: Her husband, Tammy Grimes is Forensic scientist (MPOA). If Tammy Grimes is unable, then her son, Tammy Grimes, would assume MPOA.    HEALTH MAINTENANCE: Social History   Tobacco Use  . Smoking status: Never Smoker  . Smokeless tobacco: Never Used  Substance Use Topics  . Alcohol use: No  . Drug use: No    Colonoscopy: yes, 06/07/2017  PAP:   Bone density: yes, 07/25/2018   Allergies  Allergen Reactions  . Penicillins Rash    Current Outpatient Medications  Medication Sig Dispense Refill  . amLODipine (NORVASC) 5 MG tablet TAKE 1 TABLET(5 MG) BY MOUTH DAILY 90 tablet 1  . aspirin 81 MG tablet Take 81 mg by mouth daily.    . calcium-vitamin D 250-100 MG-UNIT per tablet Take 1 tablet by mouth daily.    . Cholecalciferol (VITAMIN D3) 5000 UNITS CAPS Take 5,000 Units by mouth daily.    Marland Kitchen KRILL OIL PO Take 1 capsule by mouth daily.    . metoprolol tartrate (LOPRESSOR) 25 MG tablet Take  1/2 tablet (12.51m ) by mouth two times a day 90 tablet 1  . omeprazole (PRILOSEC) 20 MG capsule Take 1 capsule (20 mg total) by mouth daily. 90 capsule 3  . ranitidine (ZANTAC) 300 MG tablet Take 1 tablet (300 mg total) by mouth at bedtime. 90 tablet 1  . rosuvastatin (CRESTOR) 5 MG tablet TAKE 1 TABLET(5 MG) BY MOUTH DAILY. Please make yearly appt with Dr. ESaunders Revelfor July before anymore refills. 1st attempt 90 tablet 1  . SYNTHROID 75 MCG tablet TAKE 1 TABLET(75 MCG) BY MOUTH DAILY BEFORE BREAKFAST 90 tablet 0  . Turmeric 500 MG TABS Take 500 mg by mouth daily.     No current facility-administered medications for this visit.     OBJECTIVE: Middle-aged white woman in no acute distress  Vitals:   10/11/18 1300  BP: (!) 158/75  Pulse: 68  Resp: 18  Temp: 97.8 F (36.6 C)  SpO2: 96%     Body mass index is 26.21 kg/m.   Wt Readings from Last 3 Encounters:  10/11/18 143 lb 4.8 oz (65 kg)  09/26/18  144 lb 12.8 oz (65.7 kg)  06/22/18 148 lb (67.1 kg)      ECOG FS:0 - Asymptomatic  Ocular: Sclerae unicteric, pupils round and equal Ear-nose-throat: Oropharynx clear and moist Lymphatic: No cervical or supraclavicular adenopathy Lungs no rales or rhonchi Heart regular rate and rhythm Abd soft, nontender, positive bowel sounds MSK no focal spinal tenderness, no joint edema Neuro: non-focal, well-oriented, appropriate affect Breasts: The right breast is status post recent lumpectomy.  There is a significant ecchymosis.  On the inferior aspect of the breast pushing upwards there is a palpable mass measuring approximately 2 cm.  It is easily movable.  There is no nipple retraction.  The left breast is benign.  Both axillae are benign.   LAB RESULTS:  CMP     Component Value Date/Time   NA 143 10/11/2018 1206   K 4.5 10/11/2018 1206   CL 108 10/11/2018 1206   CO2 26 10/11/2018 1206   GLUCOSE 106 (H) 10/11/2018 1206   BUN 11 10/11/2018 1206   CREATININE 0.76 10/11/2018 1206   CREATININE 0.56 07/24/2013 1200   CALCIUM 9.7 10/11/2018 1206   PROT 7.3 10/11/2018 1206   ALBUMIN 4.2 10/11/2018 1206   AST 22 10/11/2018 1206   ALT 25 10/11/2018 1206   ALKPHOS 73 10/11/2018 1206   BILITOT 0.6 10/11/2018 1206   GFRNONAA >60 10/11/2018 1206   GFRAA >60 10/11/2018 1206    No results found for: TOTALPROTELP, ALBUMINELP, A1GS, A2GS, BETS, BETA2SER, GAMS, MSPIKE, SPEI  No results found for: KPAFRELGTCHN, LAMBDASER, KAPLAMBRATIO  Lab Results  Component Value Date   WBC 8.0 10/11/2018   NEUTROABS 5.1 10/11/2018   HGB 12.6 10/11/2018   HCT 40.4 10/11/2018   MCV 92.4 10/11/2018   PLT 222 10/11/2018    @LASTCHEMISTRY @  No results found for: LABCA2  No components found for: LFXTKWI097 No results for input(s): INR in the last 168 hours.  No results found for: LABCA2  No results found for: CDZH299 No results found for: CMEQ683 No results found for: CMHD622 No results found  for: CA2729  No components found for: HGQUANT  No results found for: CEA1 / No results found for: CEA1   No results found for: AFPTUMOR  No results found for: CHROMOGRNA  No results found for: PSA1  Appointment on 10/11/2018  Component Date Value Ref Range Status  .  Sodium 10/11/2018 143  135 - 145 mmol/L Final  . Potassium 10/11/2018 4.5  3.5 - 5.1 mmol/L Final  . Chloride 10/11/2018 108  98 - 111 mmol/L Final  . CO2 10/11/2018 26  22 - 32 mmol/L Final  . Glucose, Bld 10/11/2018 106* 70 - 99 mg/dL Final  . BUN 10/11/2018 11  8 - 23 mg/dL Final  . Creatinine 10/11/2018 0.76  0.44 - 1.00 mg/dL Final  . Calcium 10/11/2018 9.7  8.9 - 10.3 mg/dL Final  . Total Protein 10/11/2018 7.3  6.5 - 8.1 g/dL Final  . Albumin 10/11/2018 4.2  3.5 - 5.0 g/dL Final  . AST 10/11/2018 22  15 - 41 U/L Final  . ALT 10/11/2018 25  0 - 44 U/L Final  . Alkaline Phosphatase 10/11/2018 73  38 - 126 U/L Final  . Total Bilirubin 10/11/2018 0.6  0.3 - 1.2 mg/dL Final  . GFR, Est Non Af Am 10/11/2018 >60  >60 mL/min Final  . GFR, Est AFR Am 10/11/2018 >60  >60 mL/min Final  . Anion gap 10/11/2018 9  5 - 15 Final   Performed at [facility], [place] [address] [place], [place] [address]  . WBC Count 10/11/2018 8.0  4.0 - 10.5 K/uL Final  . RBC 10/11/2018 4.37  3.87 - 5.11 MIL/uL Final  . Hemoglobin 10/11/2018 12.6  12.0 - 15.0 g/dL Final  . HCT 10/11/2018 40.4  36.0 - 46.0 % Final  . MCV 10/11/2018 92.4  80.0 - 100.0 fL Final  . MCH 10/11/2018 28.8  26.0 - 34.0 pg Final  . MCHC 10/11/2018 31.2  30.0 - 36.0 g/dL Final  . RDW 10/11/2018 13.2  11.5 - 15.5 % Final  . Platelet Count 10/11/2018 222  150 - 400 K/uL Final  . nRBC 10/11/2018 0.0  0.0 - 0.2 % Final  . Neutrophils Relative % 10/11/2018 63  % Final  . Neutro Abs 10/11/2018 5.1  1.7 - 7.7 K/uL Final  . Lymphocytes Relative 10/11/2018 25  % Final  . Lymphs Abs 10/11/2018 2.0  0.7 - 4.0 K/uL Final  . Monocytes Relative  10/11/2018 9  % Final  . Monocytes Absolute 10/11/2018 0.7  0.1 - 1.0 K/uL Final  . Eosinophils Relative 10/11/2018 2  % Final  . Eosinophils Absolute 10/11/2018 0.2  0.0 - 0.5 K/uL Final  . Basophils Relative 10/11/2018 1  % Final  . Basophils Absolute 10/11/2018 0.0  0.0 - 0.1 K/uL Final  . Immature Granulocytes 10/11/2018 0  % Final  . Abs Immature Granulocytes 10/11/2018 0.03  0.00 - 0.07 K/uL Final   Performed at [facility], [place] [address], [place],  [address]    (this displays the last labs from the last 3 days)  No results found for: TOTALPROTELP, ALBUMINELP, A1GS, A2GS, BETS, BETA2SER, GAMS, MSPIKE, SPEI (this displays SPEP labs)  No results found for: KPAFRELGTCHN, LAMBDASER, KAPLAMBRATIO (kappa/lambda light chains)  No results found for: HGBA, HGBA2QUANT, HGBFQUANT, HGBSQUAN (Hemoglobinopathy evaluation)   No results found for: LDH  No results found for: IRON, TIBC, IRONPCTSAT (Iron and TIBC)  No results found for: FERRITIN  Urinalysis No results found for: COLORURINE, APPEARANCEUR, LABSPEC, PHURINE, GLUCOSEU, HGBUR, BILIRUBINUR, KETONESUR, PROTEINUR, UROBILINOGEN, NITRITE, LEUKOCYTESUR   STUDIES:  Outside studies discussed with the patient  ELIGIBLE FOR AVAILABLE RESEARCH PROTOCOL: UPBEAT?   ASSESSMENT: 72 y.o. [place] woman status post right breast lower inner quadrant biopsy 09/27/2018 for a clinical T1c N0 invasive  ductal carcinoma, grade 3, estrogen and progesterone receptor positive, HER-2 not amplified, with an MIB-1 of 15%  (1) genetics testing pending  (2) breast conserving surgery with sentinel lymph node sampling (but MRI pending)  (3) Oncotype DX to follow  (4) adjuvant radiation as appropriate  (5) antiestrogens to start of the completion of local treatment  PLAN: I spent approximately 60 minutes face to face with Tammy Grimes with more than 50% of that time spent in counseling and coordination of  care. Specifically we reviewed the biology of the patient's diagnosis and the specifics of her situation.  We first reviewed the fact that cancer is not one disease but more than 100 different diseases and that it is important to keep them separate-- otherwise when friends and relatives discuss their own cancer experiences with Tammy Grimes confusion can result. Similarly we explained that if breast cancer spreads to the bone or liver, the patient would not have bone cancer or liver cancer, but breast cancer in the bone and breast cancer in the liver: one cancer in three places-- not 3 different cancers which otherwise would have to be treated in 3 different ways.  We discussed the difference between local and systemic therapy. In terms of loco-regional treatment, lumpectomy plus radiation is equivalent to mastectomy as far as survival is concerned. For this reason, and because the cosmetic results are generally superior, we recommend breast conserving surgery. We also noted that in terms of sequencing of treatments, whether systemic therapy or surgery is done first does not affect the ultimate outcome.  We then discussed the rationale for systemic therapy. There is some risk that this cancer may have already spread to other parts of her body. Patients frequently ask at this point about bone scans, CAT scans and PET scans to find out if they have occult breast cancer somewhere else. The problem is that in early stage disease we are much more likely to find false positives then true cancers and this would expose the patient to unnecessary procedures as well as unnecessary radiation. Scans cannot answer the question the patient really would like to know, which is whether she has microscopic disease elsewhere in her body. For those reasons we do not recommend them.  Of course we would proceed to aggressive evaluation of any symptoms that might suggest metastatic disease, but that is not the case here.  Next we went over  the options for systemic therapy which are anti-estrogens, anti-HER-2 immunotherapy, and chemotherapy. Tammy Grimes does not meet criteria for anti-HER-2 immunotherapy. She is a good candidate for anti-estrogens.  The question of chemotherapy is more complicated. Chemotherapy is most effective in rapidly growing, aggressive tumors. It is much less effective in low-grade, slow growing cancers.Tammy Grimes 's case is mixed, high-grade, low proliferation.. For that reason we are going to request an Oncotype from the definitive surgical sample, as suggested by NCCN guidelines. That will help [place] make a definitive decision regarding chemotherapy in this case.  Tammy Grimes also qualifies for genetics testing. In patients who carry a deleterious mutation [for example in a  BRCA gene], the risk of a new breast cancer developing in the future may be sufficiently great that the patient may choose bilateral mastectomies. However if she wishes to keep her breasts in that situation it is safe to do so. That would require intensified screening, which generally means not only yearly mammography but a yearly breast MRI as well.  Today she stated that if she did carry a deleterious mutation she would opt for  intensified screening  The general plan then is to start with surgery, check Oncotype (patient understands I expect she will likely benefit from chemotherapy), followed with adjuvant radiation, then antiestrogens  [place]a has a good understanding of the overall plan. She agrees with it. She knows the goal of treatment in her case is cure. She will call with any problems that may develop before her next visit here.   Tammy Grimes, Virgie Dad, MD  10/11/18 2:39 PM Medical Oncology and Hematology [facility] [address] [place], [place] [address] Tel. [phone]    Fax. [phone]    I, Jacqualyn Posey am acting as a Education administrator for Chauncey Cruel, MD.   I, Lurline Del MD, have reviewed the above documentation for accuracy  and completeness, and I agree with the above.

## 2018-10-11 ENCOUNTER — Encounter: Payer: Self-pay | Admitting: Oncology

## 2018-10-11 ENCOUNTER — Encounter: Payer: Self-pay | Admitting: Physical Therapy

## 2018-10-11 ENCOUNTER — Inpatient Hospital Stay: Payer: Medicare Other

## 2018-10-11 ENCOUNTER — Inpatient Hospital Stay: Payer: Medicare Other | Attending: Oncology | Admitting: Oncology

## 2018-10-11 ENCOUNTER — Ambulatory Visit: Payer: Medicare Other | Attending: General Surgery | Admitting: Physical Therapy

## 2018-10-11 ENCOUNTER — Other Ambulatory Visit: Payer: Self-pay

## 2018-10-11 ENCOUNTER — Other Ambulatory Visit: Payer: Self-pay | Admitting: *Deleted

## 2018-10-11 ENCOUNTER — Other Ambulatory Visit: Payer: Self-pay | Admitting: General Surgery

## 2018-10-11 ENCOUNTER — Ambulatory Visit
Admission: RE | Admit: 2018-10-11 | Discharge: 2018-10-11 | Disposition: A | Discharge status: Home / Self Care | Payer: Medicare Other | Source: Ambulatory Visit | Attending: Radiation Oncology | Admitting: Radiation Oncology

## 2018-10-11 VITALS — BP 158/75 | HR 68 | Temp 97.8°F | Resp 18 | Ht 62.0 in | Wt 143.3 lb

## 2018-10-11 DIAGNOSIS — Z8042 Family history of malignant neoplasm of prostate: Secondary | ICD-10-CM | POA: Diagnosis not present

## 2018-10-11 DIAGNOSIS — C50311 Malignant neoplasm of lower-inner quadrant of right female breast: Secondary | ICD-10-CM | POA: Diagnosis not present

## 2018-10-11 DIAGNOSIS — C50511 Malignant neoplasm of lower-outer quadrant of right female breast: Secondary | ICD-10-CM

## 2018-10-11 DIAGNOSIS — Z170 Estrogen receptor positive status [ER+]: Secondary | ICD-10-CM | POA: Insufficient documentation

## 2018-10-11 DIAGNOSIS — Z7982 Long term (current) use of aspirin: Secondary | ICD-10-CM | POA: Insufficient documentation

## 2018-10-11 DIAGNOSIS — Z803 Family history of malignant neoplasm of breast: Secondary | ICD-10-CM | POA: Diagnosis not present

## 2018-10-11 DIAGNOSIS — Z8541 Personal history of malignant neoplasm of cervix uteri: Secondary | ICD-10-CM | POA: Insufficient documentation

## 2018-10-11 DIAGNOSIS — Z90710 Acquired absence of both cervix and uterus: Secondary | ICD-10-CM | POA: Insufficient documentation

## 2018-10-11 DIAGNOSIS — Z923 Personal history of irradiation: Secondary | ICD-10-CM | POA: Insufficient documentation

## 2018-10-11 DIAGNOSIS — R293 Abnormal posture: Secondary | ICD-10-CM

## 2018-10-11 DIAGNOSIS — Z79899 Other long term (current) drug therapy: Secondary | ICD-10-CM | POA: Diagnosis not present

## 2018-10-11 DIAGNOSIS — C439 Malignant melanoma of skin, unspecified: Secondary | ICD-10-CM

## 2018-10-11 DIAGNOSIS — Z8041 Family history of malignant neoplasm of ovary: Secondary | ICD-10-CM | POA: Diagnosis not present

## 2018-10-11 DIAGNOSIS — Z808 Family history of malignant neoplasm of other organs or systems: Secondary | ICD-10-CM | POA: Diagnosis not present

## 2018-10-11 LAB — CMP (CANCER CENTER ONLY)
ALT: 25 U/L (ref 0–44)
ANION GAP: 9 (ref 5–15)
AST: 22 U/L (ref 15–41)
Albumin: 4.2 g/dL (ref 3.5–5.0)
Alkaline Phosphatase: 73 U/L (ref 38–126)
BILIRUBIN TOTAL: 0.6 mg/dL (ref 0.3–1.2)
BUN: 11 mg/dL (ref 8–23)
CHLORIDE: 108 mmol/L (ref 98–111)
CO2: 26 mmol/L (ref 22–32)
Calcium: 9.7 mg/dL (ref 8.9–10.3)
Creatinine: 0.76 mg/dL (ref 0.44–1.00)
GFR, Est AFR Am: >60 mL/min (ref >60)
Glucose, Bld: 106 mg/dL — ABNORMAL HIGH (ref 70–99)
POTASSIUM: 4.5 mmol/L (ref 3.5–5.1)
Sodium: 143 mmol/L (ref 135–145)
TOTAL PROTEIN: 7.3 g/dL (ref 6.5–8.1)

## 2018-10-11 LAB — CBC WITH DIFFERENTIAL (CANCER CENTER ONLY)
ABS IMMATURE GRANULOCYTES: 0.03 10*3/uL (ref 0.00–0.07)
BASOS ABS: 0 10*3/uL (ref 0.0–0.1)
BASOS PCT: 1 %
EOS PCT: 2 %
Eosinophils Absolute: 0.2 10*3/uL (ref 0.0–0.5)
HCT: 40.4 % (ref 36.0–46.0)
HEMOGLOBIN: 12.6 g/dL (ref 12.0–15.0)
Immature Granulocytes: 0 %
LYMPHS PCT: 25 %
Lymphs Abs: 2 10*3/uL (ref 0.7–4.0)
MCH: 28.8 pg (ref 26.0–34.0)
MCHC: 31.2 g/dL (ref 30.0–36.0)
MCV: 92.4 fL (ref 80.0–100.0)
MONO ABS: 0.7 10*3/uL (ref 0.1–1.0)
Monocytes Relative: 9 %
NEUTROS ABS: 5.1 10*3/uL (ref 1.7–7.7)
Neutrophils Relative %: 63 %
Platelet Count: 222 10*3/uL (ref 150–400)
RBC: 4.37 MIL/uL (ref 3.87–5.11)
RDW: 13.2 % (ref 11.5–15.5)
WBC: 8 10*3/uL (ref 4.0–10.5)
nRBC: 0 % (ref 0.0–0.2)

## 2018-10-11 NOTE — Progress Notes (Signed)
Nutrition Assessment  Reason for Assessment:  Pt seen in Breast Clinic  ASSESSMENT:   72 year old female with new diagnosis of breast cancer.  Past medical history of GERD, hypothyroidism, high cholesterol, HTN  Patient reports normal appetite  Medications:  reviewed  Labs: reviewed  Anthropometrics:   Height: 61.5 inches Weight: 148 lb BMI: 27   NUTRITION DIAGNOSIS: Food and nutrition related knowledge deficit related to new diagnosis of breast cancer as evidenced by no prior need for nutrition related information.  INTERVENTION:   Discussed and provided packet of information regarding nutritional tips for breast cancer patients.  Questions answered.  Teachback method used.  Contact information provided and patient knows to contact me with questions/concerns.    MONITORING, EVALUATION, and GOAL: Pt will consume a healthy plant based diet to maintain lean body mass throughout treatment.   Chayce Rullo B. Zenia Resides, [place], [place] Registered Dietitian [phone] (pager)

## 2018-10-11 NOTE — Progress Notes (Signed)
Radiation Oncology         [phone] ________________________________  Name: Tammy Grimes        MRN: [id number]  Date of Service: 10/11/2018 DOB: [birth year]  AV:WPVXY, Bonnita Levan, MD  [name], MD     REFERRING PHYSICIAN: [name], MD   DIAGNOSIS: The encounter diagnosis was Malignant neoplasm of lower-outer quadrant of right breast of female, estrogen receptor positive ([place]).   HISTORY OF PRESENT ILLNESS: Tammy Grimes is a 72 y.o. female seen in the multidisciplinary breast clinic for a new diagnosis of right breast cancer. The patient was noted to have a screening asymmetry in the right breast.  The patient underwent diagnostic imaging revealing a 2.8 cm spiculated appearing mass in the right breast at 6:00 with concerns for possible intraductal extension.  Her axilla was negative for adenopathy.  A biopsy on 09/27/2018 revealed a grade 3 invasive ductal carcinoma.  Her cancer was ER PR positive, HER-2 negative, with a Ki-67 of 15%.  She comes today to discuss options of treatment for her cancer.    PREVIOUS RADIATION THERAPY: No   PAST MEDICAL HISTORY:  Past Medical History:  Diagnosis Date  . Allergy   . Anemia    prior to hysterectomy  . BCC (basal cell carcinoma) 08/01/2015  . Breast cancer ([place]) 09/25/2018   right  . Cancer ([place]) 2007   calf of right leg- melanoma  . Cataract   . Cervical cancer screening 06/06/2012  . DDD (degenerative disc disease), cervical 08/01/2015  . GERD (gastroesophageal reflux disease)   . Hyperglycemia 03/28/2017  . Hyperlipidemia   . Hypertension 65  . Knee pain 03/30/2012   L>R  . Low back pain 03/23/2018  . Medicare annual wellness visit, subsequent 02/09/2015  . Melanoma of skin ([place]) 08/19/2016  . Preventative health care 03/30/2012  . Thyroid disease 64  . Vitamin D deficiency 06/06/2012       PAST SURGICAL HISTORY: Past Surgical History:  Procedure Laterality Date  . ABDOMINAL HYSTERECTOMY  1990's   total, for  heavy bleeding and fibroids  . BASAL CELL CARCINOMA EXCISION    . BREAST SURGERY  early 70's   fibroid tumors removed- benign  . CATARACT EXTRACTION Bilateral 12/09/12  . COLONOSCOPY    . EYE SURGERY Bilateral 2015   cataracts  . MELANOMA EXCISION  2007     FAMILY HISTORY:  Family History  Problem Relation Age of Onset  . Hypertension Mother   . Alzheimer's disease Mother   . Dementia Mother        alzheimer's  . Aortic aneurysm Father   . Hypertension Father   . COPD Father        smoker  . Heart disease Father        s/p bypass, aortic aneurysm, carotid artery disease  . Cancer Sister 54       breast- remission  . Diabetes Son 22       type 1  . Alzheimer's disease Maternal Grandmother   . Cancer Maternal Grandfather        prostate  . Stroke Paternal Grandfather   . Ovarian cancer Maternal Aunt   . Cervical cancer Maternal Aunt   . Colon cancer Neg Hx   . Esophageal cancer Neg Hx   . Pancreatic cancer Neg Hx   . Rectal cancer Neg Hx   . Stomach cancer Neg Hx      SOCIAL HISTORY:  reports that she has never smoked. She  has never used smokeless tobacco. She reports that she does not drink alcohol or use drugs. The patient is married and lives in [place]. She's accompanied by her sister and husband.   ALLERGIES: Penicillins   MEDICATIONS:  Current Outpatient Medications  Medication Sig Dispense Refill  . amLODipine (NORVASC) 5 MG tablet TAKE 1 TABLET(5 MG) BY MOUTH DAILY 90 tablet 1  . aspirin 81 MG tablet Take 81 mg by mouth daily.    . calcium-vitamin D 250-100 MG-UNIT per tablet Take 1 tablet by mouth daily.    . Cholecalciferol (VITAMIN D3) 5000 UNITS CAPS Take 5,000 Units by mouth daily.    Marland Kitchen KRILL OIL PO Take 1 capsule by mouth daily.    . metoprolol tartrate (LOPRESSOR) 25 MG tablet Take 1/2 tablet (12.16m ) by mouth two times a day 90 tablet 1  . omeprazole (PRILOSEC) 20 MG capsule Take 1 capsule (20 mg total) by mouth daily. 90 capsule 3  .  ranitidine (ZANTAC) 300 MG tablet Take 1 tablet (300 mg total) by mouth at bedtime. 90 tablet 1  . rosuvastatin (CRESTOR) 5 MG tablet TAKE 1 TABLET(5 MG) BY MOUTH DAILY. Please make yearly appt with Dr. ESaunders Revelfor July before anymore refills. 1st attempt 90 tablet 1  . SYNTHROID 75 MCG tablet TAKE 1 TABLET(75 MCG) BY MOUTH DAILY BEFORE BREAKFAST 90 tablet 0  . Turmeric 500 MG TABS Take 500 mg by mouth daily.     No current facility-administered medications for this encounter.      REVIEW OF SYSTEMS: On review of systems, the patient reports that she is doing well overall. She denies any chest pain, shortness of breath, cough, fevers, chills, night sweats, unintended weight changes. She denies any bowel or bladder disturbances, and denies abdominal pain, nausea or vomiting. She denies any new musculoskeletal or joint aches or pains. A complete review of systems is obtained and is otherwise negative.     PHYSICAL EXAM:  Wt Readings from Last 3 Encounters:  10/11/18 143 lb 4.8 oz (65 kg)  09/26/18 144 lb 12.8 oz (65.7 kg)  06/22/18 148 lb (67.1 kg)   Temp Readings from Last 3 Encounters:  10/11/18 97.8 F (36.6 C) (Oral)  06/22/18 98.4 F (36.9 C) (Oral)  03/23/18 98.1 F (36.7 C) (Oral)   BP Readings from Last 3 Encounters:  10/11/18 (!) 158/75  09/26/18 140/80  06/22/18 112/68   Pulse Readings from Last 3 Encounters:  10/11/18 68  09/26/18 65  06/22/18 71     In general this is a well appearing caucasian female in no acute distress. She is alert and oriented x4 and appropriate throughout the examination. HEENT reveals that the patient is normocephalic, atraumatic. EOMs are intact. Skin is intact without any evidence of gross lesions. Cardiopulmonary assessment is negative for acute distress and she exhibits normal effort.    ECOG = 0  0 - Asymptomatic (Fully active, able to carry on all predisease activities without restriction)  1 - Symptomatic but completely ambulatory  (Restricted in physically strenuous activity but ambulatory and able to carry out work of a light or sedentary nature. For example, light housework, office work)  2 - Symptomatic, <50% in bed during the day (Ambulatory and capable of all self care but unable to carry out any work activities. Up and about more than 50% of waking hours)  3 - Symptomatic, >50% in bed, but not bedbound (Capable of only limited self-care, confined to bed or chair 50% or more of  waking hours)  4 - Bedbound (Completely disabled. Cannot carry on any self-care. Totally confined to bed or chair)  5 - Death   Eustace Pen MM, Creech RH, Tormey DC, et al. [phone]). "Toxicity and response criteria of the [facility]". Many Oncol. 5 (6): 649-55    LABORATORY DATA:  Lab Results  Component Value Date   WBC 8.0 10/11/2018   HGB 12.6 10/11/2018   HCT 40.4 10/11/2018   MCV 92.4 10/11/2018   PLT 222 10/11/2018   Lab Results  Component Value Date   NA 143 10/11/2018   K 4.5 10/11/2018   CL 108 10/11/2018   CO2 26 10/11/2018   Lab Results  Component Value Date   ALT 25 10/11/2018   AST 22 10/11/2018   ALKPHOS 73 10/11/2018   BILITOT 0.6 10/11/2018      RADIOGRAPHY: No results found.     IMPRESSION/PLAN: 1. 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 Dr. Lisbeth Renshaw discusses the pathology findings and reviews the nature of invasive breast disease. The consensus from the breast conference includes consideration of MRI given the intraductal extension concern. She would be a candidate for breast conservation with lumpectomy with  sentinel node biopsy.  Dr. Jana Hakim anticipates Oncotype Dx score to determine a role for systemic therapy. Provided that chemotherapy is not indicated, the patient's course would then be followed by external radiotherapy to the breast followed by antiestrogen therapy. We discussed the risks, benefits, short, and long term  effects of radiotherapy, and the patient is interested in proceeding. Dr. Lisbeth Renshaw discusses the delivery and logistics of radiotherapy and anticipates a course of 4 or 6 1/2 weeks of radiotherapy. We will see her back about 2 weeks after surgery to discuss the simulation process and anticipate we starting radiotherapy about 4-6 weeks after surgery.  2. Possible genetic predisposition to malignancy. The patient is a candidate for genetic testing given her personal and family history. She was offered referral and has agreed to referral. This will be coordinated for the patient.  In a visit lasting 30 minutes, greater than 50% of the time was spent face to face discussing her case, and coordinating the patient's care.   The above documentation reflects my direct findings during this shared patient visit. Please see the separate note by Dr. Lisbeth Renshaw on this date for the remainder of the patient's plan of care.    Carola Rhine, PAC

## 2018-10-11 NOTE — Progress Notes (Signed)
[place] Psychosocial Distress Screening Spiritual Care  Met with Tammy Grimes in [facility] to introduce [facility] team/resources, reviewing distress screen per protocol.  The patient scored a 2 on the Psychosocial Distress Thermometer which indicates mild distress. Also assessed for distress and other psychosocial needs.   ONCBCN DISTRESS SCREENING 10/11/2018  Screening Type Initial Screening  Distress experienced in past week (1-10) 2  Emotional problem type Nervousness/Anxiety;Adjusting to illness  Referral to support programs Yes   The patient presented to [facility] with her sister and husband. The patient reported that her distress level remains at a 2, indicating mild distress. The patient expressed that she feels "overwhelmed" but well supported by her family and church community. The patient and family reported that they do not have a need for any support programs at this time, but they will reach out to Patient and Family Support if any needs arise.  Follow up needed: No.   Doris Cheadle, Counseling Intern [phone]

## 2018-10-11 NOTE — Therapy (Signed)
[place], [place], [address] Phone: [phone]   Fax:  [phone]  Physical Therapy Evaluation  Patient Details  Name: Tammy Grimes MRN: [id number] Date of Birth: [birth year] Referring Provider (PT): Dr. [name]   Encounter Date: 10/11/2018  PT End of Session - 10/11/18 1612    Visit Number  1    Number of Visits  2    Date for PT Re-Evaluation  12/06/18    PT Start Time  2505    PT Stop Time  1502    PT Time Calculation (min)  24 min    Activity Tolerance  Patient tolerated treatment well    Behavior During Therapy  [facility] for tasks assessed/performed       Past Medical History:  Diagnosis Date  . Allergy   . Anemia    prior to hysterectomy  . BCC (basal cell carcinoma) 08/01/2015  . Breast cancer ([place]) 09/25/2018   right  . Cancer ([place]) 2007   calf of right leg- melanoma  . Cataract   . Cervical cancer screening 06/06/2012  . DDD (degenerative disc disease), cervical 08/01/2015  . GERD (gastroesophageal reflux disease)   . Hyperglycemia 03/28/2017  . Hyperlipidemia   . Hypertension 65  . Knee pain 03/30/2012   L>R  . Low back pain 03/23/2018  . Medicare annual wellness visit, subsequent 02/09/2015  . Melanoma of skin ([place]) 08/19/2016  . Preventative health care 03/30/2012  . Thyroid disease 64  . Vitamin D deficiency 06/06/2012    Past Surgical History:  Procedure Laterality Date  . ABDOMINAL HYSTERECTOMY  1990's   total, for heavy bleeding and fibroids  . BASAL CELL CARCINOMA EXCISION    . BREAST SURGERY  early 70's   fibroid tumors removed- benign  . CATARACT EXTRACTION Bilateral 12/09/12  . COLONOSCOPY    . EYE SURGERY Bilateral 2015   cataracts  . MELANOMA EXCISION  2007    There were no vitals filed for this visit.   Subjective Assessment - 10/11/18 1600    Subjective  Patient reports she is here today to be seen by her medical team for her newly diagnosed right  breast cancer.    Patient is accompained by:  Family member    Pertinent History  Patient was diagnosed on 09/11/18 with right grade III invasive ductal carcinoma breast cancer. It measures 2.8 cm and is located in the outer lower quadrant. It is ER/PR positive and HEr2 negative with a Ki67 of 15%.     Patient Stated Goals  Reduce lymphedema risk and learn post op shoulder ROM HEP    Currently in Pain?  No/denies         [facility] PT Assessment - 10/11/18 0001      Assessment   Medical Diagnosis  Right breast cancer    Referring Provider (PT)  Dr. [name]    Onset Date/Surgical Date  09/11/18    Hand Dominance  Left    Prior Therapy  none      Precautions   Precautions  Other (comment)    Precaution Comments  active cancer      Restrictions   Weight Bearing Restrictions  No      Balance Screen   Has the patient fallen in the past 6 months  No    Has the patient had a decrease in activity level because of a fear of falling?   No    Is the patient reluctant  to leave their home because of a fear of falling?   No      Home Environment   Living Environment  Private residence    Living Arrangements  Spouse/significant other    Available Help at Discharge  Family      Prior Function   Level of [place]  Retired    Leisure  She goes to the gym 2x/week and rows and bikes for 30 min total      Cognition   Overall Cognitive Status  Within Functional Limits for tasks assessed      Posture/Postural Control   Posture/Postural Control  Postural limitations    Postural Limitations  Rounded Shoulders;Forward head      ROM / Strength   AROM / PROM / Strength  AROM;Strength      AROM   AROM Assessment Site  Shoulder;Cervical    Right/Left Shoulder  Right;Left    Right Shoulder Extension  50 Degrees    Right Shoulder Flexion  160 Degrees    Right Shoulder ABduction  170 Degrees    Right Shoulder Internal Rotation  71 Degrees    Right Shoulder External  Rotation  90 Degrees    Left Shoulder Extension  42 Degrees    Left Shoulder Flexion  148 Degrees    Left Shoulder ABduction  156 Degrees    Left Shoulder Internal Rotation  82 Degrees    Left Shoulder External Rotation  88 Degrees    Cervical Flexion  WNL    Cervical Extension  WNL    Cervical - Right Side Bend  WNL    Cervical - Left Side Bend  WNL    Cervical - Right Rotation  WNL    Cervical - Left Rotation  WNL      Strength   Overall Strength  Within functional limits for tasks performed        LYMPHEDEMA/ONCOLOGY QUESTIONNAIRE - 10/11/18 1611      Type   Cancer Type  Right breast cancer      Lymphedema Assessments   Lymphedema Assessments  Upper extremities      Right Upper Extremity Lymphedema   10 cm Proximal to Olecranon Process  28 cm    Olecranon Process  22.8 cm    10 cm Proximal to Ulnar Styloid Process  19.8 cm    Just Proximal to Ulnar Styloid Process  14.6 cm    Across Hand at [employer]  14.8 cm    At [place] of 2nd Digit  5.5 cm      Left Upper Extremity Lymphedema   10 cm Proximal to Olecranon Process  27.4 cm    Olecranon Process  23.8 cm    10 cm Proximal to Ulnar Styloid Process  19.8 cm    Just Proximal to Ulnar Styloid Process  14.5 cm    Across Hand at [employer]  16.6 cm    At [place] of 2nd Digit  5.4 cm          Quick Dash - 10/11/18 0001    Open a tight or new jar  No difficulty    Do heavy household chores (wash walls, wash floors)  No difficulty    Carry a shopping bag or briefcase  No difficulty    Wash your back  No difficulty    Use a knife to cut food  No difficulty    Recreational activities in which you take some  force or impact through your arm, shoulder, or hand (golf, hammering, tennis)  No difficulty    During the past week, to what extent has your arm, shoulder or hand problem interfered with your normal social activities with family, friends, neighbors, or groups?  Not at all    During the past week, to what  extent has your arm, shoulder or hand problem limited your work or other regular daily activities  Not at all    Arm, shoulder, or hand pain.  None    Tingling (pins and needles) in your arm, shoulder, or hand  None    Difficulty Sleeping  No difficulty    DASH Score  0 %        Objective measurements completed on examination: See above findings.        Patient was instructed today in a home exercise program today for post op shoulder range of motion. These included active assist shoulder flexion in sitting, scapular retraction, wall walking with shoulder abduction, and hands behind head external rotation.  She was encouraged to do these twice a day, holding 3 seconds and repeating 5 times when permitted by her physician.          PT Education - 10/11/18 1612    Education Details  Lymphedema risk reduction and post op shoulder ROM HEP    Person(s) Educated  Patient;Spouse    Methods  Explanation;Demonstration;Handout    Comprehension  Returned demonstration;Verbalized understanding          PT Long Term Goals - 10/11/18 1615      PT LONG TERM GOAL #1   Title  Patient will demonstrate she has regained full shoulder ROM and function post operatively compared to baseline measurements.    Time  8    Period  Weeks    Status  New      Breast Clinic Goals - 10/11/18 1615      Patient will be able to verbalize understanding of pertinent lymphedema risk reduction practices relevant to her diagnosis specifically related to skin care.   Time  1    Period  Days    Status  Achieved      Patient will be able to return demonstrate and/or verbalize understanding of the post-op home exercise program related to regaining shoulder range of motion.   Time  1    Period  Days    Status  Achieved      Patient will be able to verbalize understanding of the importance of attending the postoperative After Breast Cancer Class for further lymphedema risk reduction education and  therapeutic exercise.   Time  1    Period  Days    Status  Achieved            Plan - 10/11/18 1613    Clinical Impression Statement  Patient was diagnosed on 09/11/18 with right grade III invasive ductal carcinoma breast cancer. It measures 2.8 cm and is located in the outer lower quadrant. It is ER/PR positive and HEr2 negative with a Ki67 of 15%. Her multidisciplinary medical team met prior to her assessments to determine a recommended treatment plan. She is planning to have a right lumpectomy and sentinel node biopsy followed by Oncotype testing, radiation, and anti-estrogen therapy. She will benefit from post op PT to regain shoulder ROM and reduce lymphedema risk.    Clinical Presentation  Stable    Clinical Decision Making  Low    Rehab Potential  Excellent  Clinical Impairments Affecting Rehab Potential  None    PT Frequency  --   Eval and 1 f/u visit   PT Treatment/Interventions  ADLs/Self Care Home Management;Therapeutic exercise;Patient/family education    PT Next Visit Plan  Will reassess 3-4 weeks post op to determine needs    PT Home Exercise Plan  Post op shoulder ROM HEP    Consulted and Agree with Plan of Care  Patient;Family member/caregiver    Family Member Consulted  Husband and sister       Patient will benefit from skilled therapeutic intervention in order to improve the following deficits and impairments:  Postural dysfunction, Decreased range of motion, Decreased knowledge of precautions, Pain, Impaired UE functional use  Visit Diagnosis: Malignant neoplasm of lower-outer quadrant of right breast of female, estrogen receptor positive ([place]) - Plan: PT plan of care cert/re-cert  Abnormal posture - Plan: PT plan of care cert/re-cert   Patient will follow up at outpatient cancer rehab 3-4 weeks following surgery.  If the patient requires physical therapy at that time, a specific plan will be dictated and sent to the referring physician for approval. The  patient was educated today on appropriate basic range of motion exercises to begin post operatively and the importance of attending the After Breast Cancer class following surgery.  Patient was educated today on lymphedema risk reduction practices as it pertains to recommendations that will benefit the patient immediately following surgery.  She verbalized good understanding.     Problem List Patient Active Problem List   Diagnosis Date Noted  . Malignant neoplasm of lower-outer quadrant of right breast of female, estrogen receptor positive ([place]) 10/04/2018  . Knee pain, bilateral 03/23/2018  . Atypical chest pain 05/07/2017  . Hyperglycemia 03/28/2017  . Shortness of breath 03/28/2017  . Melanoma of skin ([place]) 08/19/2016  . BCC (basal cell carcinoma) 08/01/2015  . DDD (degenerative disc disease), cervical 08/01/2015  . Medicare annual wellness visit, subsequent 02/09/2015  . Cervical cancer screening 06/06/2012  . Osteopenia 06/06/2012  . Vitamin D deficiency 06/06/2012  . Preventative health care 03/30/2012  . Knee pain 03/30/2012  . Thyroid disease   . GERD (gastroesophageal reflux disease)   . Essential hypertension   . Anemia   . Allergy   . Hyperlipidemia     Annia Friendly, [place] 10/11/18 4:17 PM   [place], [place], [address] Phone: [phone]   Fax:  [phone]  Name: Tammy Grimes MRN: [id number] Date of Birth: [birth year]

## 2018-10-11 NOTE — Patient Instructions (Signed)

## 2018-10-12 ENCOUNTER — Encounter: Payer: Self-pay | Admitting: Genetics

## 2018-10-12 ENCOUNTER — Inpatient Hospital Stay (HOSPITAL_BASED_OUTPATIENT_CLINIC_OR_DEPARTMENT_OTHER): Payer: Medicare Other | Admitting: Genetics

## 2018-10-12 ENCOUNTER — Telehealth: Payer: Self-pay | Admitting: *Deleted

## 2018-10-12 DIAGNOSIS — C50311 Malignant neoplasm of lower-inner quadrant of right female breast: Secondary | ICD-10-CM

## 2018-10-12 DIAGNOSIS — Z800 Family history of malignant neoplasm of digestive organs: Secondary | ICD-10-CM

## 2018-10-12 DIAGNOSIS — Z8052 Family history of malignant neoplasm of bladder: Secondary | ICD-10-CM

## 2018-10-12 DIAGNOSIS — Z8041 Family history of malignant neoplasm of ovary: Secondary | ICD-10-CM

## 2018-10-12 DIAGNOSIS — Z803 Family history of malignant neoplasm of breast: Secondary | ICD-10-CM

## 2018-10-12 DIAGNOSIS — Z8541 Personal history of malignant neoplasm of cervix uteri: Secondary | ICD-10-CM

## 2018-10-12 DIAGNOSIS — Z8042 Family history of malignant neoplasm of prostate: Secondary | ICD-10-CM

## 2018-10-12 DIAGNOSIS — Z170 Estrogen receptor positive status [ER+]: Secondary | ICD-10-CM

## 2018-10-12 DIAGNOSIS — C50511 Malignant neoplasm of lower-outer quadrant of right female breast: Secondary | ICD-10-CM

## 2018-10-12 DIAGNOSIS — C439 Malignant melanoma of skin, unspecified: Secondary | ICD-10-CM

## 2018-10-12 NOTE — Telephone Encounter (Signed)
Spoke with patient to let her know I was able to get her MRI breast moved to 12/11 at 3:30pm.  Patient confirmed.

## 2018-10-16 ENCOUNTER — Encounter: Payer: Self-pay | Admitting: Genetics

## 2018-10-16 DIAGNOSIS — Z8041 Family history of malignant neoplasm of ovary: Secondary | ICD-10-CM | POA: Insufficient documentation

## 2018-10-16 DIAGNOSIS — Z800 Family history of malignant neoplasm of digestive organs: Secondary | ICD-10-CM | POA: Insufficient documentation

## 2018-10-16 DIAGNOSIS — Z803 Family history of malignant neoplasm of breast: Secondary | ICD-10-CM | POA: Insufficient documentation

## 2018-10-16 DIAGNOSIS — Z8052 Family history of malignant neoplasm of bladder: Secondary | ICD-10-CM | POA: Insufficient documentation

## 2018-10-16 DIAGNOSIS — Z8042 Family history of malignant neoplasm of prostate: Secondary | ICD-10-CM | POA: Insufficient documentation

## 2018-10-16 NOTE — Progress Notes (Signed)
REFERRING PROVIDER: Chauncey Cruel, MD [address] [place], [place] [address]  PRIMARY PROVIDER:  [name], MD  PRIMARY REASON FOR VISIT:  1. Melanoma of skin ([place])   2. Family history of breast cancer   3. Family history of ovarian cancer   4. Family history of prostate cancer   5. Family history of colon cancer   6. Family history of bladder cancer   7. Malignant neoplasm of lower-outer quadrant of right breast of female, estrogen receptor positive ([place])     HISTORY OF PRESENT ILLNESS:   Tammy Grimes, a 72 y.o. female, was seen for a [facility] cancer genetics consultation at the request of Dr. Jana Hakim due to a personal and family history of cancer.  Tammy Grimes presents to clinic today to discuss the possibility of a hereditary predisposition to cancer, genetic testing, and to further clarify her future cancer risks, as well as potential cancer risks for family members.   In 2019, at the age of 33, Tammy Grimes was diagnosed with Invasive ductal carcinoma of the right breast, ER/PR+, HER2-.  She also has a history of a melanoma removed from her calf a few years ago. She reports a history of basal cell carcinoma as well.    HORMONAL RISK FACTORS:  Menarche was at age 69.  First live birth at age 42.  OCP use: yes  Ovaries intact: no.  Hysterectomy: yes.  Menopausal status: postmenopausal.  HRT use: 1992-2003 years. Colonoscopy: yes; no polyps. Mammogram within the last year: yes.  Past Medical History:  Diagnosis Date  . Allergy   . Anemia    prior to hysterectomy  . BCC (basal cell carcinoma) 08/01/2015  . Breast cancer ([place]) 09/25/2018   right  . Cancer ([place]) 2007   calf of right leg- melanoma  . Cataract   . Cervical cancer screening 06/06/2012  . DDD (degenerative disc disease), cervical 08/01/2015  . Family history of bladder cancer   . Family history of breast cancer   . Family history of colon cancer   . Family history of ovarian cancer   .  Family history of prostate cancer   . GERD (gastroesophageal reflux disease)   . Hyperglycemia 03/28/2017  . Hyperlipidemia   . Hypertension 65  . Knee pain 03/30/2012   L>R  . Low back pain 03/23/2018  . Medicare annual wellness visit, subsequent 02/09/2015  . Melanoma of skin ([place]) 08/19/2016  . Preventative health care 03/30/2012  . Thyroid disease 64  . Vitamin D deficiency 06/06/2012    Past Surgical History:  Procedure Laterality Date  . ABDOMINAL HYSTERECTOMY  1990's   total, for heavy bleeding and fibroids  . BASAL CELL CARCINOMA EXCISION    . BREAST SURGERY  early 70's   fibroid tumors removed- benign  . CATARACT EXTRACTION Bilateral 12/09/12  . COLONOSCOPY    . EYE SURGERY Bilateral 2015   cataracts  . MELANOMA EXCISION  2007    Social History   Socioeconomic History  . Marital status: Married    Spouse name: Not on file  . Number of children: Not on file  . Years of education: Not on file  . Highest education level: Not on file  Occupational History  . Not on file  Social Needs  . Financial resource strain: Not on file  . Food insecurity:    Worry: Not on file    Inability: Not on file  . Transportation needs:    Medical: Not on  file    Non-medical: Not on file  Tobacco Use  . Smoking status: Never Smoker  . Smokeless tobacco: Never Used  Substance and Sexual Activity  . Alcohol use: No  . Drug use: No  . Sexual activity: Yes    Comment: lives with husband, retired from [employer] work, no dietary restrictions  Lifestyle  . Physical activity:    Days per week: Not on file    Minutes per session: Not on file  . Stress: Not on file  Relationships  . Social connections:    Talks on phone: Not on file    Gets together: Not on file    Attends religious service: Not on file    Active member of club or organization: Not on file    Attends meetings of clubs or organizations: Not on file    Relationship status: Not on file  Other Topics Concern  .  Not on file  Social History Narrative  . Not on file     FAMILY HISTORY:  We obtained a detailed, 4-generation family history.  Significant diagnoses are listed below: Family History  Problem Relation Age of Onset  . Hypertension Mother   . Alzheimer's disease Mother   . Dementia Mother        alzheimer's  . Aortic aneurysm Father   . Hypertension Father   . COPD Father        smoker  . Heart disease Father        s/p bypass, aortic aneurysm, carotid artery disease  . Breast cancer Sister 13  . Diabetes Son 22       type 1  . Alzheimer's disease Maternal Grandmother   . Prostate cancer Maternal Grandfather 60       metastatic  . Stroke Paternal Grandfather   . Ovarian cancer Maternal Aunt 75  . Cancer Maternal Aunt        cervical or uterine  . Colon cancer Paternal Uncle 63  . Breast cancer Cousin 64  . Breast cancer Cousin 46  . Colon cancer Cousin        dx >50  . Esophageal cancer Neg Hx   . Pancreatic cancer Neg Hx   . Rectal cancer Neg Hx   . Stomach cancer Neg Hx     Tammy Grimes has 1 son who is 15.  He has 2 daughters.  Tammy Grimes has a sister, Sammi who was dx with breast cancer at 42 and is now 57.   Tammy Grimes father: died at 40 due to COPD.  Paternal aunts/Uncles: 2 paternal uncles, 1 paternal aunt. 1 paternal uncle died of colon cancer in his 70's.  Paternal cousins: 1 paternal cousin dx with breast cancer in her 62's.  Paternal grandfather: died older than 51 due to a stroke.   Paternal grandmother:died in her 71's with no hx of cancer.   Tammy Grimes mother: died with Alzheimer's disease at 76, no hx of cancer.  She had her uterus and ovaries intact.  Maternal Aunts/Uncles: 3 maternal aunts, 1 maternal uncles. 1 maternal aunt died of ovarian cancer dx in her 43's.  1 maternal aunt had either uterine or cervical cancer in her 74's/60's, and now is 21.  The maternal uncle had bladder cancer.  Maternal cousins: 1 cousin died of breast cancer at 68,  unk age dx, 1 cousin dx with colon cancer >50.  Maternal grandfather: dx with prostate cancer rin his 48's, it metastasized and he died at 74.  Maternal grandmother:died in her 11's with no hx of cancer.  Tammy Grimes is unaware of previous family history of genetic testing for hereditary cancer risks. Patient's maternal ancestors are of N. European descent, and paternal ancestors are of N. European descent. There is no reported Ashkenazi Jewish ancestry. There is no known consanguinity.  GENETIC COUNSELING ASSESSMENT: Tammy Grimes is a 72 y.o. female with a personal and family history which is somewhat suggestive of a Hereditary Cancer Predisposition Syndrome. We, therefore, discussed and recommended the following at today's visit.   DISCUSSION: We reviewed the characteristics, features and inheritance patterns of hereditary cancer syndromes. We also discussed genetic testing, including the appropriate family members to test, the process of testing, insurance coverage and turn-around-time for results. We discussed the implications of a negative, positive and/or variant of uncertain significant result. We recommended Tammy Grimes pursue genetic testing for the Common Hereditary Cancers gene panel.   She expressed that the results of this testing would not impact her upcoming  breast surgical decision.   The Common Hereditary Cancer Panel offered by [facility] includes sequencing and/or deletion duplication testing of the following 53 genes: APC, ATM, AXIN2, BARD1, BMPR1A, BRCA1, BRCA2, BRIP1, BUB1B, CDH1, CDK4, CDKN2A, CHEK2, CTNNA1, DICER1, ENG, EPCAM, GALNT12, GREM1, HOXB13, KIT, MEN1, MLH1, MLH3, MSH2, MSH3, MSH6, MUTYH, NBN, NF1, NTHL1, PALB2, PDGFRA, PMS2, POLD1, POLE, PTEN, RAD50, RAD51C, RAD51D, RNF43, RPS20, SDHA, SDHB, SDHC, SDHD, SMAD4, SMARCA4, STK11, TP53, TSC1, TSC2, VHL  We discussed that only 5-10% of cancers are associated with a Hereditary cancer predisposition syndrome.  One of the most  common hereditary cancer syndromes that increases breast cancer risk is called Hereditary Breast and Ovarian Cancer (HBOC) syndrome.  This syndrome is caused by mutations in the BRCA1 and BRCA2 genes.  This syndrome increases an individual's lifetime risk to develop breast, ovarian, pancreatic, and other types of cancer.  There are also many other cancer predisposition syndromes caused by mutations in several other genes.  We discussed that if she is found to have a mutation in one of these genes, it may impact surgical decisions, and alter future medical management recommendations such as increased cancer screenings and consideration of risk reducing surgeries.  A positive result could also have implications for the patient's family members.  A Negative result would mean we were unable to identify a hereditary component to her cancer, but does not rule out the possibility of a hereditary basis for her cancer.  There could be mutations that are undetectable by current technology, or in genes not yet tested or identified to increase cancer risk.    We discussed the potential to find a Variant of Uncertain Significance or VUS.  These are variants that have not yet been identified as pathogenic or benign, and it is unknown if this variant is associated with increased cancer risk or if this is a normal finding.  Most VUS's are reclassified to benign or likely benign.   It should not be used to make medical management decisions. With time, we suspect the lab will determine the significance of any VUS's identified if any.   Based on Tammy Grimes's personal and family history of cancer, she meets medical criteria for genetic testing. Despite that she meets criteria, she may still have an out of pocket cost. The laboratory can provide her with an estimate of her OOP cost. she was given the contact information of the laboratory if she has further questions.   PLAN: After considering the risks, benefits, and  limitations, Ms. Batts  provided informed consent to pursue genetic testing and the blood sample was sent to [facility] for analysis of the Common Hereditary Cancers Panel. Results should be available within approximately 2-3 weeks' time, at which point they will be disclosed by telephone to Tammy Grimes, as will any additional recommendations warranted by these results. Tammy Grimes will receive a summary of her genetic counseling visit and a copy of her results once available. This information will also be available in Epic. We encouraged Tammy Grimes to remain in contact with cancer genetics annually so that we can continuously update the family history and inform her of any changes in cancer genetics and testing that may be of benefit for her family. Tammy Grimes questions were answered to her satisfaction today. Our contact information was provided should additional questions or concerns arise.  Based on Tammy Grimes's family history, we recommended her maternal relatives also, have genetic counseling and testing. Tammy Grimes will let us know if we can be of any assistance in coordinating genetic counseling and/or testing for this family member.   Lastly, we encouraged Ms. Lauro to remain in contact with cancer genetics annually so that we can continuously update the family history and inform her of any changes in cancer genetics and testing that may be of benefit for this family.   Ms.  Panameno questions were answered to her satisfaction today. Our contact information was provided should additional questions or concerns arise. Thank you for the referral and allowing [place] to share in the care of your patient.   Tana Felts, MS, [facility] Certified Genetic Counselor [email or web address] phone: [phone]  The patient was seen for a total of 35 minutes in face-to-face genetic counseling.  The patient was accompanied today by her husband. This patient was discussed with Drs.  Magrinat, Lindi Adie and/or Burr Medico who agrees with the above.

## 2018-10-18 ENCOUNTER — Ambulatory Visit (HOSPITAL_COMMUNITY)
Admission: RE | Admit: 2018-10-18 | Discharge: 2018-10-18 | Disposition: A | Discharge status: Home / Self Care | Payer: Medicare Other | Source: Ambulatory Visit | Attending: General Surgery | Admitting: General Surgery

## 2018-10-18 DIAGNOSIS — Z170 Estrogen receptor positive status [ER+]: Secondary | ICD-10-CM | POA: Diagnosis not present

## 2018-10-18 DIAGNOSIS — N6310 Unspecified lump in the right breast, unspecified quadrant: Secondary | ICD-10-CM | POA: Diagnosis not present

## 2018-10-18 DIAGNOSIS — Z853 Personal history of malignant neoplasm of breast: Secondary | ICD-10-CM | POA: Diagnosis not present

## 2018-10-18 DIAGNOSIS — C50511 Malignant neoplasm of lower-outer quadrant of right female breast: Secondary | ICD-10-CM | POA: Diagnosis not present

## 2018-10-18 MED ORDER — GADOBUTROL 1 MMOL/ML IV SOLN
7.0000 mL | Freq: Once | INTRAVENOUS | Status: AC | PRN
  Administered 2018-10-18: 7 mL via INTRAVENOUS

## 2018-10-19 ENCOUNTER — Encounter: Payer: Self-pay | Admitting: Family Medicine

## 2018-10-20 ENCOUNTER — Other Ambulatory Visit: Payer: Self-pay | Admitting: General Surgery

## 2018-10-20 ENCOUNTER — Ambulatory Visit (HOSPITAL_COMMUNITY): Payer: Medicare Other

## 2018-10-23 ENCOUNTER — Telehealth: Payer: Self-pay | Admitting: Genetics

## 2018-10-23 ENCOUNTER — Other Ambulatory Visit: Payer: Self-pay | Admitting: General Surgery

## 2018-10-23 ENCOUNTER — Telehealth: Payer: Self-pay | Admitting: *Deleted

## 2018-10-23 DIAGNOSIS — C50511 Malignant neoplasm of lower-outer quadrant of right female breast: Secondary | ICD-10-CM

## 2018-10-23 DIAGNOSIS — Z170 Estrogen receptor positive status [ER+]: Principal | ICD-10-CM

## 2018-10-23 NOTE — Telephone Encounter (Signed)
Revealed negative genetic testing.   This normal result is reassuring and indicates that it is unlikely Tammy Grimes's cancer is due to a hereditary cause.  It is unlikely that there is an increased risk of another cancer due to a mutation in one of these genes.  However, genetic testing is not perfect, and cannot definitively rule out a hereditary cause.  It will be important for Tammy to keep in contact with genetics to learn if any additional testing may be needed in the future.     I still recommended Tammy Grimes and Tammy Grimes all have genetic testing because there could still be a genetic mutation in the family Tammy Grimes did not inherit and therefore was not found in Tammy test.

## 2018-10-23 NOTE — Telephone Encounter (Signed)
  Oncology Nurse Navigator Documentation  Navigator Location: [facility] (10/23/18 1200)   )Navigator Encounter Type: Telephone;[facility]C Follow-up (10/23/18 1200) Telephone: Outgoing Call;Clinic/[facility] Follow-up (10/23/18 1200)     Surgery Date: 11/07/18 (10/23/18 1200) Genetic Counseling Date: 10/12/18 (10/23/18 1200) Genetic Counseling Type: Urgent (10/23/18 1200)                                        Time Spent with Patient: 15 (10/23/18 1200)

## 2018-10-24 ENCOUNTER — Telehealth: Payer: Self-pay | Admitting: Genetics

## 2018-10-24 NOTE — Telephone Encounter (Signed)
Made in error - duplicate

## 2018-10-25 ENCOUNTER — Encounter: Payer: Self-pay | Admitting: Genetics

## 2018-10-25 ENCOUNTER — Ambulatory Visit: Payer: Self-pay | Admitting: Genetics

## 2018-10-25 DIAGNOSIS — Z800 Family history of malignant neoplasm of digestive organs: Secondary | ICD-10-CM

## 2018-10-25 DIAGNOSIS — Z170 Estrogen receptor positive status [ER+]: Secondary | ICD-10-CM

## 2018-10-25 DIAGNOSIS — C50511 Malignant neoplasm of lower-outer quadrant of right female breast: Secondary | ICD-10-CM

## 2018-10-25 DIAGNOSIS — C439 Malignant melanoma of skin, unspecified: Secondary | ICD-10-CM

## 2018-10-25 DIAGNOSIS — Z1379 Encounter for other screening for genetic and chromosomal anomalies: Secondary | ICD-10-CM | POA: Insufficient documentation

## 2018-10-25 DIAGNOSIS — Z8042 Family history of malignant neoplasm of prostate: Secondary | ICD-10-CM

## 2018-10-25 DIAGNOSIS — Z803 Family history of malignant neoplasm of breast: Secondary | ICD-10-CM

## 2018-10-25 DIAGNOSIS — Z8052 Family history of malignant neoplasm of bladder: Secondary | ICD-10-CM

## 2018-10-25 DIAGNOSIS — Z8041 Family history of malignant neoplasm of ovary: Secondary | ICD-10-CM

## 2018-10-25 NOTE — Progress Notes (Signed)
HPI:  Ms. Huizinga was previously seen in the [place] clinic on 10/12/2018 due to a personal and family history of cancer and concerns regarding a hereditary predisposition to cancer. Please refer to our prior cancer genetics clinic note for more information regarding Ms. Depasquale's medical, social and family histories, and our assessment and recommendations, at the time. Ms. Delia recent genetic test results were disclosed to her, as well as recommendations warranted by these results. These results and recommendations are discussed in more detail below.  CANCER HISTORY:    Malignant neoplasm of lower-outer quadrant of right breast of female, estrogen receptor positive ([place])   10/04/2018 Initial Diagnosis    Malignant neoplasm of lower-outer quadrant of right breast of female, estrogen receptor positive ([place])    10/23/2018 Genetic Testing    The Common Hereditary Cancer Panel + Melanoma Panel was tested (57 genes): APC, ATM, AXIN2, BAP1, BARD1, BMPR1A, BRCA1, BRCA2, BRIP1, BUB1B, CDH1, CDK4, CDKN2A, CHEK2, CTNNA1, DICER1, ENG, EPCAM, GALNT12, GREM1, HOXB13, KIT, MEN1, MITF, MLH1, MLH3, MSH2, MSH3, MSH6, MUTYH, NBN, NF1, NTHL1, PALB2, PDGFRA, PMS2, POLD1, POLE, POT1 PTEN, RAD50, RAD51C, RAD51D, RB1, RNF43, RPS20, SDHA, SDHB, SDHC, SDHD, SMAD4, SMARCA4, STK11, TP53, TSC1, TSC2, VHL.  Results: Negative, No pathogenic variants identified.  The date of this test report is 10/23/2018.      FAMILY HISTORY:  We obtained a detailed, 4-generation family history.  Significant diagnoses are listed below: Family History  Problem Relation Age of Onset  . Hypertension Mother   . Alzheimer's disease Mother   . Dementia Mother        alzheimer's  . Aortic aneurysm Father   . Hypertension Father   . COPD Father        smoker  . Heart disease Father        s/p bypass, aortic aneurysm, carotid artery disease  . Breast cancer Sister 22  . Diabetes Son 22       type 1  . Alzheimer's  disease Maternal Grandmother   . Prostate cancer Maternal Grandfather 60       metastatic  . Stroke Paternal Grandfather   . Ovarian cancer Maternal Aunt 75  . Cancer Maternal Aunt        cervical or uterine  . Colon cancer Paternal Uncle 35  . Breast cancer Cousin 71  . Breast cancer Cousin 80  . Colon cancer Cousin        dx >50  . Esophageal cancer Neg Hx   . Pancreatic cancer Neg Hx   . Rectal cancer Neg Hx   . Stomach cancer Neg Hx     Ms. Ben has 1 son who is 71.  He has 2 daughters.  Ms. Hottinger has a sister, Sammi who was dx with breast cancer at 11 and is now 43.   Ms. Louth father: died at 50 due to COPD.  Paternal aunts/Uncles: 2 paternal uncles, 1 paternal aunt. 1 paternal uncle died of colon cancer in his 20's.  Paternal cousins: 1 paternal cousin dx with breast cancer in her 45's.  Paternal grandfather: died older than 27 due to a stroke.   Paternal grandmother:died in her 79's with no hx of cancer.   Ms. Trickey mother: died with Alzheimer's disease at 44, no hx of cancer.  She had her uterus and ovaries intact.  Maternal Aunts/Uncles: 3 maternal aunts, 1 maternal uncles. 1 maternal aunt died of ovarian cancer dx in her 29's.  1 maternal aunt had either uterine  or cervical cancer in her 50's/60's, and now is 91.  The maternal uncle had bladder cancer.  Maternal cousins: 1 cousin died of breast cancer at 38, unk age dx, 1 cousin dx with colon cancer >50.  Maternal grandfather: dx with prostate cancer rin his 18's, it metastasized and he died at 44.  Maternal grandmother:died in her 38's with no hx of cancer.  Ms. Beller is unaware of previous family history of genetic testing for hereditary cancer risks. Patient's maternal ancestors are of N. European descent, and paternal ancestors are of N. European descent. There is no reported Ashkenazi Jewish ancestry. There is no known consanguinity.  GENETIC TEST RESULTS: Genetic testing performed through [facility]'s  Common Hereditary Cancers panel + Melanoma panel reported out on 10/23/2018 showed no pathogenic mutations.   The Common Hereditary Cancer Panel + Melanoma Panel was tested (57 genes): APC, ATM, AXIN2, BAP1, BARD1, BMPR1A, BRCA1, BRCA2, BRIP1, BUB1B, CDH1, CDK4, CDKN2A, CHEK2, CTNNA1, DICER1, ENG, EPCAM, GALNT12, GREM1, HOXB13, KIT, MEN1, MITF, MLH1, MLH3, MSH2, MSH3, MSH6, MUTYH, NBN, NF1, NTHL1, PALB2, PDGFRA, PMS2, POLD1, POLE, POT1 PTEN, RAD50, RAD51C, RAD51D, RB1, RNF43, RPS20, SDHA, SDHB, SDHC, SDHD, SMAD4, SMARCA4, STK11, TP53, TSC1, TSC2, VHL.  The test report will be scanned into EPIC and will be located under the Molecular Pathology section of the Results Review tab. A portion of the result report is included below for reference.     We discussed with Ms. Hoak that because current genetic testing is not perfect, it is possible there may be a gene mutation in one of these genes that current testing cannot detect, but that chance is small.  We also discussed, that there could be another gene that has not yet been discovered, or that we have not yet tested, that is responsible for the cancer diagnoses in the family. It is also possible there is a hereditary cause for the cancer in the family that Ms. Gunkel did not inherit and therefore was not identified in her testing.  Therefore, it is important to remain in touch with cancer genetics in the future so that we can continue to offer Ms. Ferreras the most up to date genetic testing.   ADDITIONAL GENETIC TESTING: We discussed with Ms. Eduardo that her genetic testing was fairly extensive.  If there are are genes identified to increase cancer risk that can be analyzed in the future, we would be happy to discuss and coordinate this testing at that time.    CANCER SCREENING RECOMMENDATIONS: Ms. Heward test result is negative (normal).  This means that we have not identified a hereditary cause for her personal and family history of cancer at  this time.   Ms. Bastedo test result is considered negative (normal).  This means that we have not identified a hereditary predisposition to cancer in her at this time.   While reassuring, this does not definitively rule out a hereditary predisposition to cancer. It is still possible that there could be genetic mutations that are undetectable by current technology, or genetic mutations in genes that have not been tested or identified to increase cancer risk.  Therefore, it is recommended she continue to follow the cancer management and screening guidelines provided by her oncology and primary healthcare provider. An individual's cancer risk is not determined by genetic test results alone.  Overall cancer risk assessment includes additional factors such as personal medical history, family history, etc.  These should be used to make a personalized plan for cancer prevention and surveillance.  RECOMMENDATIONS FOR FAMILY MEMBERS:  Relatives in this family might be at some increased risk of developing cancer, over the general population risk, simply due to the family history of cancer.  We recommended women in this family have a yearly mammogram beginning at age 26, or 66 years younger than the earliest onset of cancer, an annual clinical breast exam, and perform monthly breast self-exams. Women in this family should also have a gynecological exam as recommended by their primary provider. All family members should have a colonoscopy by age 64 (or as directed by their doctors).  All family members should inform their physicians about the family history of cancer so their doctors can make the most appropriate screening recommendations for them.   It is also possible there is a hereditary cause for the cancer in Ms. Whitehorn's family that she did not inherit and therefore was not identified in her.  Therefore, we recommended her siblings and maternal relatives also have genetic counseling and testing. Ms. Fuhr  will let us know if we can be of any assistance in coordinating genetic counseling and/or testing for these family members.   FOLLOW-UP: Lastly, we discussed with Ms. Gervacio that cancer genetics is a rapidly advancing field and it is possible that new genetic tests will be appropriate for her and/or her family members in the future. We encouraged her to remain in contact with cancer genetics on an annual basis so we can update her personal and family histories and let her know of advances in cancer genetics that may benefit this family.   Our contact number was provided. Ms. Rasberry questions were answered to her satisfaction, and she knows she is welcome to call us at anytime with additional questions or concerns.   Ferol Luz, MS, [facility] Certified Genetic Counselor [email or web address]

## 2018-10-26 ENCOUNTER — Other Ambulatory Visit: Payer: Self-pay

## 2018-10-26 ENCOUNTER — Encounter (HOSPITAL_BASED_OUTPATIENT_CLINIC_OR_DEPARTMENT_OTHER): Payer: Self-pay | Admitting: *Deleted

## 2018-10-30 ENCOUNTER — Telehealth: Payer: Self-pay | Admitting: *Deleted

## 2018-10-30 ENCOUNTER — Encounter (HOSPITAL_BASED_OUTPATIENT_CLINIC_OR_DEPARTMENT_OTHER)
Admission: RE | Admit: 2018-10-30 | Discharge: 2018-10-30 | Disposition: A | Discharge status: Home / Self Care | Payer: Medicare Other | Source: Ambulatory Visit | Attending: General Surgery | Admitting: General Surgery

## 2018-10-30 DIAGNOSIS — Z01810 Encounter for preprocedural cardiovascular examination: Secondary | ICD-10-CM | POA: Diagnosis not present

## 2018-10-30 MED ORDER — ENSURE PRE-SURGERY PO LIQD: 296.0000 mL | Freq: Once | ORAL | Status: DC

## 2018-10-30 NOTE — Telephone Encounter (Signed)
Received Mammogram results from [place]; forwarded to provider/SLS 12/23

## 2018-10-30 NOTE — Progress Notes (Addendum)
Anesthesia consult and EKG reviewed  per Dr. R.Fitzgerald per Dr. Marlowe Aschoff request, will proceed with surgery as scheduled.   Ensure pre surgery drink given with instructions to complete by 0600 dos, pt verbalized understanding.

## 2018-11-02 ENCOUNTER — Encounter: Payer: Self-pay | Admitting: Family Medicine

## 2018-11-02 DIAGNOSIS — C50311 Malignant neoplasm of lower-inner quadrant of right female breast: Secondary | ICD-10-CM | POA: Diagnosis not present

## 2018-11-02 LAB — HM MAMMOGRAPHY

## 2018-11-07 ENCOUNTER — Other Ambulatory Visit: Payer: Self-pay

## 2018-11-07 ENCOUNTER — Ambulatory Visit (HOSPITAL_BASED_OUTPATIENT_CLINIC_OR_DEPARTMENT_OTHER): Payer: Medicare Other | Admitting: Anesthesiology

## 2018-11-07 ENCOUNTER — Ambulatory Visit (HOSPITAL_COMMUNITY)
Admission: RE | Admit: 2018-11-07 | Discharge: 2018-11-07 | Disposition: A | Discharge status: Home / Self Care | Payer: Medicare Other | Source: Ambulatory Visit | Attending: General Surgery | Admitting: General Surgery

## 2018-11-07 ENCOUNTER — Encounter (HOSPITAL_BASED_OUTPATIENT_CLINIC_OR_DEPARTMENT_OTHER)
Admission: RE | Disposition: A | Discharge status: Home / Self Care | Payer: Self-pay | Source: Home / Self Care | Attending: General Surgery

## 2018-11-07 ENCOUNTER — Encounter (HOSPITAL_BASED_OUTPATIENT_CLINIC_OR_DEPARTMENT_OTHER): Payer: Self-pay | Admitting: Anesthesiology

## 2018-11-07 ENCOUNTER — Ambulatory Visit (HOSPITAL_BASED_OUTPATIENT_CLINIC_OR_DEPARTMENT_OTHER)
Admission: RE | Admit: 2018-11-07 | Discharge: 2018-11-07 | Disposition: A | Discharge status: Home / Self Care | Payer: Medicare Other | Attending: General Surgery | Admitting: General Surgery

## 2018-11-07 DIAGNOSIS — Z170 Estrogen receptor positive status [ER+]: Secondary | ICD-10-CM | POA: Diagnosis not present

## 2018-11-07 DIAGNOSIS — E7800 Pure hypercholesterolemia, unspecified: Secondary | ICD-10-CM | POA: Insufficient documentation

## 2018-11-07 DIAGNOSIS — C50911 Malignant neoplasm of unspecified site of right female breast: Secondary | ICD-10-CM | POA: Diagnosis not present

## 2018-11-07 DIAGNOSIS — Z79890 Hormone replacement therapy (postmenopausal): Secondary | ICD-10-CM | POA: Insufficient documentation

## 2018-11-07 DIAGNOSIS — E785 Hyperlipidemia, unspecified: Secondary | ICD-10-CM | POA: Diagnosis not present

## 2018-11-07 DIAGNOSIS — K219 Gastro-esophageal reflux disease without esophagitis: Secondary | ICD-10-CM | POA: Diagnosis not present

## 2018-11-07 DIAGNOSIS — Z79899 Other long term (current) drug therapy: Secondary | ICD-10-CM | POA: Diagnosis not present

## 2018-11-07 DIAGNOSIS — E039 Hypothyroidism, unspecified: Secondary | ICD-10-CM | POA: Diagnosis not present

## 2018-11-07 DIAGNOSIS — Z803 Family history of malignant neoplasm of breast: Secondary | ICD-10-CM | POA: Diagnosis not present

## 2018-11-07 DIAGNOSIS — N6314 Unspecified lump in the right breast, lower inner quadrant: Secondary | ICD-10-CM | POA: Diagnosis not present

## 2018-11-07 DIAGNOSIS — I10 Essential (primary) hypertension: Secondary | ICD-10-CM | POA: Diagnosis not present

## 2018-11-07 DIAGNOSIS — C50511 Malignant neoplasm of lower-outer quadrant of right female breast: Secondary | ICD-10-CM

## 2018-11-07 DIAGNOSIS — Z8041 Family history of malignant neoplasm of ovary: Secondary | ICD-10-CM | POA: Insufficient documentation

## 2018-11-07 HISTORY — PX: BREAST LUMPECTOMY WITH RADIOACTIVE SEED AND SENTINEL LYMPH NODE BIOPSY: SHX6550

## 2018-11-07 HISTORY — DX: Other specified postprocedural states: R11.2

## 2018-11-07 HISTORY — DX: Other specified postprocedural states: Z98.890

## 2018-11-07 HISTORY — DX: Hypothyroidism, unspecified: E03.9

## 2018-11-07 SURGERY — BREAST LUMPECTOMY WITH RADIOACTIVE SEED AND SENTINEL LYMPH NODE BIOPSY
Anesthesia: General | Site: Breast | Laterality: Right

## 2018-11-07 MED ORDER — SCOPOLAMINE 1 MG/3DAYS TD PT72
MEDICATED_PATCH | TRANSDERMAL | Status: AC
  Filled 2018-11-07: qty 1

## 2018-11-07 MED ORDER — OXYCODONE HCL 5 MG PO TABS: 5.0000 mg | ORAL_TABLET | Freq: Four times a day (QID) | ORAL | 0 refills | Status: DC | PRN

## 2018-11-07 MED ORDER — ONDANSETRON HCL 4 MG/2ML IJ SOLN
INTRAMUSCULAR | Status: DC | PRN
  Administered 2018-11-07: 4 mg via INTRAVENOUS

## 2018-11-07 MED ORDER — PROPOFOL 10 MG/ML IV BOLUS
INTRAVENOUS | Status: DC | PRN
  Administered 2018-11-07: 120 mg via INTRAVENOUS

## 2018-11-07 MED ORDER — ACETAMINOPHEN 500 MG PO TABS
ORAL_TABLET | ORAL | Status: AC
  Filled 2018-11-07: qty 2

## 2018-11-07 MED ORDER — METHYLENE BLUE 0.5 % INJ SOLN
INTRAVENOUS | Status: AC
  Filled 2018-11-07: qty 10

## 2018-11-07 MED ORDER — CHLORHEXIDINE GLUCONATE CLOTH 2 % EX PADS: 6.0000 | MEDICATED_PAD | Freq: Once | CUTANEOUS | Status: DC

## 2018-11-07 MED ORDER — EPHEDRINE SULFATE-NACL 50-0.9 MG/10ML-% IV SOSY
PREFILLED_SYRINGE | INTRAVENOUS | Status: DC | PRN
  Administered 2018-11-07 (×4): 10 mg via INTRAVENOUS

## 2018-11-07 MED ORDER — TECHNETIUM TC 99M SULFUR COLLOID FILTERED
1.0000 | Freq: Once | INTRAVENOUS | Status: AC | PRN
  Administered 2018-11-07: 1 via INTRADERMAL

## 2018-11-07 MED ORDER — FENTANYL CITRATE (PF) 100 MCG/2ML IJ SOLN
INTRAMUSCULAR | Status: AC
  Filled 2018-11-07: qty 2

## 2018-11-07 MED ORDER — CIPROFLOXACIN IN D5W 400 MG/200ML IV SOLN
INTRAVENOUS | Status: AC
  Filled 2018-11-07: qty 200

## 2018-11-07 MED ORDER — OXYCODONE HCL 5 MG PO TABS: 5.0000 mg | ORAL_TABLET | Freq: Once | ORAL | Status: DC | PRN

## 2018-11-07 MED ORDER — MEPERIDINE HCL 25 MG/ML IJ SOLN: 6.2500 mg | INTRAMUSCULAR | Status: DC | PRN

## 2018-11-07 MED ORDER — CIPROFLOXACIN IN D5W 400 MG/200ML IV SOLN
400.0000 mg | INTRAVENOUS | Status: AC
  Administered 2018-11-07: 400 mg via INTRAVENOUS

## 2018-11-07 MED ORDER — GABAPENTIN 300 MG PO CAPS
300.0000 mg | ORAL_CAPSULE | ORAL | Status: AC
  Administered 2018-11-07: 300 mg via ORAL

## 2018-11-07 MED ORDER — FENTANYL CITRATE (PF) 100 MCG/2ML IJ SOLN
50.0000 ug | INTRAMUSCULAR | Status: AC | PRN
  Administered 2018-11-07: 50 ug via INTRAVENOUS
  Administered 2018-11-07: 25 ug via INTRAVENOUS
  Administered 2018-11-07: 50 ug via INTRAVENOUS

## 2018-11-07 MED ORDER — FENTANYL CITRATE (PF) 100 MCG/2ML IJ SOLN
25.0000 ug | INTRAMUSCULAR | Status: DC | PRN
  Administered 2018-11-07: 25 ug via INTRAVENOUS

## 2018-11-07 MED ORDER — MIDAZOLAM HCL 2 MG/2ML IJ SOLN
1.0000 mg | INTRAMUSCULAR | Status: DC | PRN
  Administered 2018-11-07: 1 mg via INTRAVENOUS

## 2018-11-07 MED ORDER — LACTATED RINGERS IV SOLN
INTRAVENOUS | Status: DC
  Administered 2018-11-07: 09:00:00 via INTRAVENOUS

## 2018-11-07 MED ORDER — SCOPOLAMINE 1 MG/3DAYS TD PT72
1.0000 | MEDICATED_PATCH | TRANSDERMAL | Status: DC
  Administered 2018-11-07: 1.5 mg via TRANSDERMAL

## 2018-11-07 MED ORDER — BUPIVACAINE HCL (PF) 0.25 % IJ SOLN
INTRAMUSCULAR | Status: AC
  Filled 2018-11-07: qty 60

## 2018-11-07 MED ORDER — LIDOCAINE-EPINEPHRINE (PF) 1 %-1:200000 IJ SOLN
INTRAMUSCULAR | Status: DC | PRN
  Administered 2018-11-07: 10 mL via INTRAMUSCULAR

## 2018-11-07 MED ORDER — LIDOCAINE-EPINEPHRINE (PF) 1 %-1:200000 IJ SOLN
INTRAMUSCULAR | Status: AC
  Filled 2018-11-07: qty 30

## 2018-11-07 MED ORDER — SCOPOLAMINE 1 MG/3DAYS TD PT72: 1.0000 | MEDICATED_PATCH | Freq: Once | TRANSDERMAL | Status: DC | PRN

## 2018-11-07 MED ORDER — LIDOCAINE 2% (20 MG/ML) 5 ML SYRINGE
INTRAMUSCULAR | Status: DC | PRN
  Administered 2018-11-07: 100 mg via INTRAVENOUS

## 2018-11-07 MED ORDER — ACETAMINOPHEN 500 MG PO TABS
1000.0000 mg | ORAL_TABLET | ORAL | Status: AC
  Administered 2018-11-07: 1000 mg via ORAL

## 2018-11-07 MED ORDER — GABAPENTIN 300 MG PO CAPS
ORAL_CAPSULE | ORAL | Status: AC
  Filled 2018-11-07: qty 1

## 2018-11-07 MED ORDER — SODIUM CHLORIDE (PF) 0.9 % IJ SOLN
INTRAVENOUS | Status: DC | PRN
  Administered 2018-11-07: 5 mL

## 2018-11-07 MED ORDER — OXYCODONE HCL 5 MG/5ML PO SOLN: 5.0000 mg | Freq: Once | ORAL | Status: DC | PRN

## 2018-11-07 MED ORDER — SODIUM CHLORIDE (PF) 0.9 % IJ SOLN
INTRAMUSCULAR | Status: AC
  Filled 2018-11-07: qty 10

## 2018-11-07 MED ORDER — MIDAZOLAM HCL 2 MG/2ML IJ SOLN
INTRAMUSCULAR | Status: AC
  Filled 2018-11-07: qty 2

## 2018-11-07 MED ORDER — EPHEDRINE 5 MG/ML INJ
INTRAVENOUS | Status: AC
  Filled 2018-11-07: qty 10

## 2018-11-07 MED ORDER — DEXAMETHASONE SODIUM PHOSPHATE 4 MG/ML IJ SOLN
4.0000 mg | INTRAMUSCULAR | Status: AC
  Administered 2018-11-07: 10 mg via INTRAVENOUS

## 2018-11-07 SURGICAL SUPPLY — 64 items
BINDER BREAST LRG (GAUZE/BANDAGES/DRESSINGS) ×2 IMPLANT
BINDER BREAST MEDIUM (GAUZE/BANDAGES/DRESSINGS) IMPLANT
BINDER BREAST XLRG (GAUZE/BANDAGES/DRESSINGS) IMPLANT
BINDER BREAST XXLRG (GAUZE/BANDAGES/DRESSINGS) IMPLANT
BLADE SURG 10 STRL SS (BLADE) ×2 IMPLANT
BLADE SURG 15 STRL LF DISP TIS (BLADE) ×1 IMPLANT
BLADE SURG 15 STRL SS (BLADE) ×1
BNDG COHESIVE 4X5 TAN STRL (GAUZE/BANDAGES/DRESSINGS) ×2 IMPLANT
CANISTER SUC SOCK COL 7IN (MISCELLANEOUS) IMPLANT
CANISTER SUCT 1200ML W/VALVE (MISCELLANEOUS) ×2 IMPLANT
CHLORAPREP W/TINT 26ML (MISCELLANEOUS) ×2 IMPLANT
CLIP VESOCCLUDE LG 6/CT (CLIP) ×2 IMPLANT
CLIP VESOCCLUDE MED 6/CT (CLIP) ×4 IMPLANT
COVER MAYO STAND STRL (DRAPES) ×2 IMPLANT
COVER PROBE W GEL 5X96 (DRAPES) ×2 IMPLANT
DECANTER SPIKE VIAL GLASS SM (MISCELLANEOUS) IMPLANT
DERMABOND ADVANCED (GAUZE/BANDAGES/DRESSINGS) ×1
DERMABOND ADVANCED .7 DNX12 (GAUZE/BANDAGES/DRESSINGS) ×1 IMPLANT
DRAPE UTILITY XL STRL (DRAPES) ×2 IMPLANT
DRSG PAD ABDOMINAL 8X10 ST (GAUZE/BANDAGES/DRESSINGS) ×2 IMPLANT
ELECT COATED BLADE 2.86 ST (ELECTRODE) ×2 IMPLANT
ELECT REM PT RETURN 9FT ADLT (ELECTROSURGICAL) ×2
ELECTRODE REM PT RTRN 9FT ADLT (ELECTROSURGICAL) ×1 IMPLANT
GAUZE SPONGE 4X4 12PLY STRL LF (GAUZE/BANDAGES/DRESSINGS) ×2 IMPLANT
GLOVE BIO SURGEON STRL SZ 6 (GLOVE) ×4 IMPLANT
GLOVE BIO SURGEON STRL SZ7 (GLOVE) ×2 IMPLANT
GLOVE BIOGEL PI IND STRL 6.5 (GLOVE) ×1 IMPLANT
GLOVE BIOGEL PI IND STRL 7.5 (GLOVE) ×1 IMPLANT
GLOVE BIOGEL PI INDICATOR 6.5 (GLOVE) ×1
GLOVE BIOGEL PI INDICATOR 7.5 (GLOVE) ×1
GOWN STRL REUS W/ TWL LRG LVL3 (GOWN DISPOSABLE) IMPLANT
GOWN STRL REUS W/ TWL XL LVL3 (GOWN DISPOSABLE) ×1 IMPLANT
GOWN STRL REUS W/TWL 2XL LVL3 (GOWN DISPOSABLE) ×2 IMPLANT
GOWN STRL REUS W/TWL LRG LVL3 (GOWN DISPOSABLE)
GOWN STRL REUS W/TWL XL LVL3 (GOWN DISPOSABLE) ×1
KIT MARKER MARGIN INK (KITS) ×2 IMPLANT
LIGHT WAVEGUIDE WIDE FLAT (MISCELLANEOUS) ×2 IMPLANT
NDL SAFETY ECLIPSE 18X1.5 (NEEDLE) IMPLANT
NEEDLE HYPO 18GX1.5 SHARP (NEEDLE)
NEEDLE HYPO 25X1 1.5 SAFETY (NEEDLE) ×4 IMPLANT
NS IRRIG 1000ML POUR BTL (IV SOLUTION) ×2 IMPLANT
PACK BASIN DAY SURGERY FS (CUSTOM PROCEDURE TRAY) ×2 IMPLANT
PACK UNIVERSAL I (CUSTOM PROCEDURE TRAY) ×2 IMPLANT
PENCIL BUTTON HOLSTER BLD 10FT (ELECTRODE) ×2 IMPLANT
SLEEVE SCD COMPRESS KNEE MED (MISCELLANEOUS) ×2 IMPLANT
SPONGE LAP 18X18 RF (DISPOSABLE) ×6 IMPLANT
STAPLER VISISTAT 35W (STAPLE) IMPLANT
STOCKINETTE IMPERVIOUS LG (DRAPES) ×2 IMPLANT
STRIP CLOSURE SKIN 1/2X4 (GAUZE/BANDAGES/DRESSINGS) ×2 IMPLANT
SUT ETHILON 2 0 FS 18 (SUTURE) IMPLANT
SUT MNCRL AB 4-0 PS2 18 (SUTURE) ×2 IMPLANT
SUT MON AB 5-0 PS2 18 (SUTURE) IMPLANT
SUT SILK 2 0 SH (SUTURE) IMPLANT
SUT VIC AB 2-0 SH 27 (SUTURE) ×1
SUT VIC AB 2-0 SH 27XBRD (SUTURE) ×1 IMPLANT
SUT VIC AB 3-0 SH 27 (SUTURE)
SUT VIC AB 3-0 SH 27X BRD (SUTURE) IMPLANT
SUT VICRYL 3-0 CR8 SH (SUTURE) ×2 IMPLANT
SYR BULB 3OZ (MISCELLANEOUS) ×2 IMPLANT
SYR CONTROL 10ML LL (SYRINGE) ×4 IMPLANT
TOWEL GREEN STERILE FF (TOWEL DISPOSABLE) ×2 IMPLANT
TRAY FAXITRON CT DISP (TRAY / TRAY PROCEDURE) IMPLANT
TUBE CONNECTING 20X1/4 (TUBING) ×2 IMPLANT
YANKAUER SUCT BULB TIP NO VENT (SUCTIONS) ×2 IMPLANT

## 2018-11-07 NOTE — Progress Notes (Signed)
Assisted Dr. Germeroth with right, ultrasound guided, pectoralis block. Side rails up, monitors on throughout procedure. See vital signs in flow sheet. Tolerated Procedure well. 

## 2018-11-07 NOTE — Op Note (Addendum)
Right Breast Radioactive seed localized lumpectomy and sentinel lymph node mapping and biopsy  Indications: This patient presents with history of screening detected right breast cancer, lower outer quadrant, grade 3 invasive ductal carcinoma, cT2N0, +/+/-  Pre-operative Diagnosis: right breast cancer  Post-operative Diagnosis: Same  Surgeon: [name]   Anesthesia: General endotracheal anesthesia  ASA Class: 2  Procedure Details  The patient was seen in the Holding Room. The risks, benefits, complications, treatment options, and expected outcomes were discussed with the patient. The possibilities of bleeding, infection, the need for additional procedures, failure to diagnose a condition, and creating a complication requiring transfusion or operation were discussed with the patient. The patient concurred with the proposed plan, giving informed consent.  The site of surgery properly noted/marked. The patient was taken to Operating Room # 8, identified, and the procedure verified as Right Breast seed localized Lumpectomy with sentinel lymph node biopsy.  The seed was confirmed preoperatively by neoprobe and mammogram. A Time Out was held and the above information confirmed.  The methylene blue was injected into the subareolar position.   The right arm, breast, and chest were prepped and draped in standard fashion. The lumpectomy was performed by creating an inferior circumareolar incision near the previously placed radioactive seed.  Dissection was carried down to around the point of maximum signal intensity. The cautery was used to perform the dissection.  Hemostasis was achieved with cautery. The edges of the cavity were marked with large clips, with one each medial, lateral, superior, and  posteriorly.   The specimen was inked with the margin marker paint kit.    Specimen radiography confirmed inclusion of the mammographic lesion, the clip, and the seed.  The background signal in the breast was  zero.  The wound was irrigated and closed with 3-0 vicryl in layers and 4-0 monocryl subcuticular suture.    Using a hand-held gamma probe, right axillary sentinel nodes were identified transcutaneously.  An oblique incision was created below the axillary hairline.  Dissection was carried through the clavipectoral fascia.  Three deep level 2 axillary sentinel nodes were removed.  Counts per second were 80, 490, and 310.    The background count was 5 cps.  The wound was irrigated.  Hemostasis was achieved with cautery.  The axillary incision was closed with a 3-0 vicryl deep dermal interrupted sutures and a 4-0 monocryl subcuticular closure.    Sterile dressings were applied. At the end of the operation, all sponge, instrument, and needle counts were correct.  Findings: grossly clear surgical margins and no adenopathy.  Anterior margin is skin.    Estimated Blood Loss:  min         Specimens: Right breast tissue with seed, three right axillary sentinel lymph nodes.             Complications:  None; patient tolerated the procedure well.         Disposition: PACU - hemodynamically stable.         Condition: stable

## 2018-11-07 NOTE — Discharge Instructions (Addendum)
[facility] °Office Phone Number [phone] ° °BREAST BIOPSY/ PARTIAL MASTECTOMY: POST OP INSTRUCTIONS ° °Always review your discharge instruction sheet given to you by the facility where your surgery was performed. ° °IF YOU HAVE DISABILITY OR FAMILY LEAVE FORMS, YOU MUST BRING THEM TO THE OFFICE FOR PROCESSING.  DO NOT GIVE THEM TO YOUR DOCTOR. ° °1. A prescription for pain medication may be given to you upon discharge.  Take your pain medication as prescribed, if needed.  If narcotic pain medicine is not needed, then you may take acetaminophen (Tylenol) or ibuprofen (Advil) as needed. °2. Take your usually prescribed medications unless otherwise directed °3. If you need a refill on your pain medication, please contact your pharmacy.  They will contact our office to request authorization.  Prescriptions will not be filled after 5pm or on week-ends. °4. You should eat very light the first 24 hours after surgery, such as soup, crackers, pudding, etc.  Resume your normal diet the day after surgery. °5. Most patients will experience some swelling and bruising in the breast.  Ice packs and a good support bra will help.  Swelling and bruising can take several days to resolve.  °6. It is common to experience some constipation if taking pain medication after surgery.  Increasing fluid intake and taking a stool softener will usually help or prevent this problem from occurring.  A mild laxative (Milk of Magnesia or Miralax) should be taken according to package directions if there are no bowel movements after 48 hours. °7. Unless discharge instructions indicate otherwise, you may remove your bandages 48 hours after surgery, and you may shower at that time.  You may have steri-strips (small skin tapes) in place directly over the incision.  These strips should be left on the skin for 7-10 days.   Any sutures or staples will be removed at the office during your follow-up visit. °8. ACTIVITIES:  You may resume  regular daily activities (gradually increasing) beginning the next day.  Wearing a good support bra or sports bra (or the breast binder) minimizes pain and swelling.  You may have sexual intercourse when it is comfortable. °a. You may drive when you no longer are taking prescription pain medication, you can comfortably wear a seatbelt, and you can safely maneuver your car and apply brakes. °b. RETURN TO WORK:  __________1 week_______________ °9. You should see your doctor in the office for a follow-up appointment approximately two weeks after your surgery.  Your doctor’s nurse will typically make your follow-up appointment when she calls you with your pathology report.  Expect your pathology report 2-3 business days after your surgery.  You may call to check if you do not hear from us after three days. ° ° °WHEN TO CALL YOUR DOCTOR: °1. Fever over 101.0 °2. Nausea and/or vomiting. °3. Extreme swelling or bruising. °4. Continued bleeding from incision. °5. Increased pain, redness, or drainage from the incision. ° °The clinic staff is available to answer your questions during regular business hours.  Please don’t hesitate to call and ask to speak to one of the nurses for clinical concerns.  If you have a medical emergency, go to the nearest emergency room or call 911.  A surgeon from [facility] is always on call at the hospital. ° °For further questions, please visit [email or web address]  ° ° °Post Anesthesia Home Care Instructions ° °Activity: °Get plenty of rest for the remainder of the day. A responsible individual must stay with you for 24   hours following the procedure.  °For the next 24 hours, DO NOT: °-Drive a car °-Operate machinery °-Drink alcoholic beverages °-Take any medication unless instructed by your physician °-Make any legal decisions or sign important papers. ° °Meals: °Start with liquid foods such as gelatin or soup. Progress to regular foods as tolerated. Avoid greasy, spicy,  heavy foods. If nausea and/or vomiting occur, drink only clear liquids until the nausea and/or vomiting subsides. Call your physician if vomiting continues. ° °Special Instructions/Symptoms: °Your throat may feel dry or sore from the anesthesia or the breathing tube placed in your throat during surgery. If this causes discomfort, gargle with warm salt water. The discomfort should disappear within 24 hours. ° °If you had a scopolamine patch placed behind your ear for the management of post- operative nausea and/or vomiting: ° °1. The medication in the patch is effective for 72 hours, after which it should be removed.  Wrap patch in a tissue and discard in the trash. Wash hands thoroughly with soap and water. °2. You may remove the patch earlier than 72 hours if you experience unpleasant side effects which may include dry mouth, dizziness or visual disturbances. °3. Avoid touching the patch. Wash your hands with soap and water after contact with the patch. °  ° °

## 2018-11-07 NOTE — Anesthesia Postprocedure Evaluation (Signed)
Anesthesia Post Note  Patient: Tammy Grimes  Procedure(s) Performed: RIGHT BREAST LUMPECTOMY WITH RADIOACTIVE SEED AND SENTINEL LYMPH NODE BIOPSY (Right Breast)     Patient location during evaluation: PACU Anesthesia Type: General Level of consciousness: sedated and patient cooperative Pain management: pain level controlled Vital Signs Assessment: post-procedure vital signs reviewed and stable Respiratory status: spontaneous breathing Cardiovascular status: stable Anesthetic complications: no    Last Vitals:  Vitals:   11/07/18 1345 11/07/18 1400  BP: 116/63 (!) 115/98  Pulse: 68 77  Resp: 11 19  Temp:    SpO2: 95% 92%    Last Pain:  Vitals:   11/07/18 1400  TempSrc:   PainSc: [place]

## 2018-11-07 NOTE — Interval H&P Note (Signed)
History and Physical Interval Note:  11/07/2018 10:49 AM  Tammy Grimes  has presented today for surgery, with the diagnosis of RIGHT BREAST CANCER  The various methods of treatment have been discussed with the patient and family. After consideration of risks, benefits and other options for treatment, the patient has consented to  Procedure(s): BREAST LUMPECTOMY WITH RADIOACTIVE SEED AND SENTINEL LYMPH NODE BIOPSY (Right) as a surgical intervention .  The patient's history has been reviewed, patient examined, no change in status, stable for surgery.  I have reviewed the patient's chart and labs.  Questions were answered to the patient's satisfaction.     [name]

## 2018-11-07 NOTE — Progress Notes (Signed)
Nuc med injection performed by nuc med staff. Pt tol well with fentanyl for sedation. VSS.

## 2018-11-07 NOTE — Anesthesia Preprocedure Evaluation (Signed)
Anesthesia Evaluation  Patient identified by MRN, date of birth, ID band Patient awake    Reviewed: Allergy & Precautions, NPO status , Patient's Chart, lab work & pertinent test results, reviewed documented beta blocker date and time   History of Anesthesia Complications (+) PONV and history of anesthetic complications  Airway Mallampati: II  TM Distance: >3 FB Neck ROM: Full    Dental  (+) Dental Advisory Given   Pulmonary shortness of breath,    Pulmonary exam normal breath sounds clear to auscultation       Cardiovascular hypertension, Pt. on medications and Pt. on home beta blockers Normal cardiovascular exam Rhythm:Regular Rate:Normal     Neuro/Psych negative neurological ROS  negative psych ROS   GI/Hepatic Neg liver ROS, GERD  ,  Endo/Other  Hypothyroidism   Renal/GU negative Renal ROS     Musculoskeletal  (+) Arthritis ,   Abdominal (+) + obese,   Peds  Hematology negative hematology ROS (+) anemia ,   Anesthesia Other Findings   Reproductive/Obstetrics negative OB ROS                             Anesthesia Physical Anesthesia Plan  ASA: II  Anesthesia Plan: General   Post-op Pain Management: GA combined w/ Regional for post-op pain   Induction: Intravenous  PONV Risk Score and Plan: 4 or greater and Ondansetron, Dexamethasone and Treatment may vary due to age or medical condition  Airway Management Planned: LMA  Additional Equipment: None  Intra-op Plan:   Post-operative Plan: Extubation in OR  Informed Consent: I have reviewed the patients History and Physical, chart, labs and discussed the procedure including the risks, benefits and alternatives for the proposed anesthesia with the patient or authorized representative who has indicated his/her understanding and acceptance.   Dental advisory given  Plan Discussed with: CRNA  Anesthesia Plan Comments:         Anesthesia Quick Evaluation

## 2018-11-07 NOTE — Anesthesia Procedure Notes (Signed)
Procedure Name: LMA Insertion Date/Time: 11/07/2018 11:12 AM Performed by: Lieutenant Diego, CRNA Pre-anesthesia Checklist: Patient identified, Emergency Drugs available, Suction available and Patient being monitored Patient Re-evaluated:Patient Re-evaluated prior to induction Oxygen Delivery Method: Circle system utilized Preoxygenation: Pre-oxygenation with 100% oxygen Induction Type: IV induction Ventilation: Mask ventilation without difficulty LMA: LMA inserted LMA Size: 4.0 Number of attempts: 1 Placement Confirmation: positive ETCO2 and breath sounds checked- equal and bilateral Tube secured with: Tape Dental Injury: Teeth and Oropharynx as per pre-operative assessment

## 2018-11-07 NOTE — Transfer of Care (Signed)
Immediate Anesthesia Transfer of Care Note  Patient: Tammy Grimes  Procedure(s) Performed: RIGHT BREAST LUMPECTOMY WITH RADIOACTIVE SEED AND SENTINEL LYMPH NODE BIOPSY (Right Breast)  Patient Location: PACU  Anesthesia Type:GA combined with regional for post-op pain  Level of Consciousness: sedated  Airway & Oxygen Therapy: Patient Spontanous Breathing and Patient connected to face mask oxygen  Post-op Assessment: Report given to RN and Post -op Vital signs reviewed and stable  Post vital signs: Reviewed and stable  Last Vitals:  Vitals Value Taken Time  BP 116/60 11/07/2018 12:34 PM  Temp    Pulse 71 11/07/2018 12:38 PM  Resp 14 11/07/2018 12:38 PM  SpO2 92 % 11/07/2018 12:38 PM  Vitals shown include unvalidated device data.  Last Pain:  Vitals:   11/07/18 0835  TempSrc: Oral  PainSc: 0-No pain         Complications: No apparent anesthesia complications

## 2018-11-07 NOTE — H&P (Signed)
Tammy Grimes Documented: 10/11/2018 7:27 AM Location: [facility] Patient #: [id number] DOB: 1946/10/06 Undefined / Language: Tammy Grimes / Race: White Female   History of Present Illness Tammy Klein MD; 10/11/2018 3:06 PM) The patient is a 72 year old female who presents with breast cancer. Pt is a 72 yo F who is diagnosed with right breast cancer 09/2018. She had screening detected asymmetry. Diagnostic imaging showed a spiculated mass 4 cm from the nipple at 6 o'clock that was 2.8 cm in greatest dimension, but with some ductal extension measured up to 3.2 cm. Core needle biopsy was performed and demonstrated a grade 3 invasive ductal carcinoma, hormone receptor positive, her 2 negative, Ki 67 15%.   Of note, she has a personal history of melanoma, a sister who had breast cancer at age 35, a maternal aunt with ovarian cancer, and another maternal aunt with uterine or cervical cancer. She does not drink alcohol or smoke. She had menarche at age 56-13. She had hysterectomy in her 50s and stopped having periods then. She is a G1P1 wtih first child at age 57. She used hormonal contraception for around 13 years and used hormone replacement therapy for a while.   Imaging and reports are from [facility] and are reviewed in multidisciplinary fashion.    Pathology 09/27/2018 Diagnosis Breast, right, needle core biopsy, 2.8 cm mass at 6 o'clock, 4 cm fn - INVASIVE DUCTAL CARCINOMA, GRADE III The tumor cells are NEGATIVE for Her2 (1+). Estrogen Receptor: 95%, POSITIVE, STRONG STAINING INTENSITY Progesterone Receptor: 95%, POSITIVE, STRONG STAINING INTENSITY Proliferation Marker Ki67: 15%  CBC, CMET essentially normal.       Past Surgical History Tammy Pummel, RN; 10/11/2018 7:27 AM) Breast Biopsy  Bilateral. multiple Hysterectomy (not due to cancer) - Complete  Oral Surgery   Diagnostic Studies History Tammy Pummel, RN; 10/11/2018 7:27 AM) Colonoscopy  within  last year Mammogram  within last year Pap Smear  >5 years ago  Medication History Tammy Pummel, RN; 10/11/2018 7:27 AM) Medications Reconciled  Social History Tammy Pummel, RN; 10/11/2018 7:27 AM) Caffeine use  Carbonated beverages, Coffee, Tea. No alcohol use  No drug use  Tobacco use  Never smoker.  Family History Tammy Pummel, RN; 10/11/2018 7:27 AM) Breast Cancer  Sister. Cerebrovascular Accident  Father. Diabetes Mellitus  Son. Heart Disease  Father. Respiratory Condition  Father.  Pregnancy / Birth History Tammy Pummel, RN; 10/11/2018 7:27 AM) Age at menarche  26 years. Contraceptive History  Oral contraceptives. Gravida  1 Maternal age  4-25 Para  22  Other Problems Tammy Pummel, RN; 10/11/2018 7:27 AM) Gastroesophageal Reflux Disease  General anesthesia - complications  High blood pressure  Hypercholesterolemia  Lump In Breast  Melanoma  Migraine Headache  Thyroid Disease     Review of Systems Tammy Spillers Ledford RN; 10/11/2018 7:27 AM) General Not Present- Appetite Loss, Chills, Fatigue, Fever, Night Sweats, Weight Gain and Weight Loss. Skin Not Present- Change in Wart/Mole, Dryness, Hives, Jaundice, New Lesions, Non-Healing Wounds, Rash and Ulcer. HEENT Not Present- Earache, Hearing Loss, Hoarseness, Nose Bleed, Oral Ulcers, Ringing in the Ears, Seasonal Allergies, Sinus Pain, Sore Throat, Visual Disturbances, Wears glasses/contact lenses and Yellow Eyes. Respiratory Not Present- Bloody sputum, Chronic Cough, Difficulty Breathing, Snoring and Wheezing. Breast Not Present- Breast Mass, Breast Pain, Nipple Discharge and Skin Changes. Cardiovascular Not Present- Chest Pain, Difficulty Breathing Lying Down, Leg Cramps, Palpitations, Rapid Heart Rate, Shortness of Breath and Swelling of Extremities. Gastrointestinal Not Present- Abdominal Pain, Bloating, Bloody Stool, Change in  Bowel Habits, Chronic diarrhea, Constipation, Difficulty  Swallowing, Excessive gas, Gets full quickly at meals, Hemorrhoids, Indigestion, Nausea, Rectal Pain and Vomiting. Female Genitourinary Not Present- Frequency, Nocturia, Painful Urination, Pelvic Pain and Urgency. Musculoskeletal Present- Back Pain. Not Present- Joint Pain, Joint Stiffness, Muscle Pain, Muscle Weakness and Swelling of Extremities. Neurological Not Present- Decreased Memory, Fainting, Headaches, Numbness, Seizures, Tingling, Tremor, Trouble walking and Weakness. Psychiatric Not Present- Anxiety, Bipolar, Change in Sleep Pattern, Depression, Fearful and Frequent crying. Endocrine Not Present- Cold Intolerance, Excessive Hunger, Hair Changes, Heat Intolerance, Hot flashes and New Diabetes. Hematology Present- Blood Thinners and Easy Bruising. Not Present- Excessive bleeding, Gland problems, HIV and Persistent Infections.  Vitals Tammy Klein MD; 10/11/2018 1:29 PM) 10/11/2018 1:29 PM Weight: 143.3 lb Height: 62in Body Surface Area: 1.66 m Body Mass Index: 26.21 kg/m  Temp.: 97.60F  Pulse: 68 (Regular)  Resp.: 18 (Unlabored)  BP: 158/75 (Sitting, Left Arm, Standard)       Physical Exam Tammy Klein MD; 10/11/2018 3:09 PM) General Mental Status-Alert. General Appearance-Consistent with stated age. Hydration-Well hydrated. Voice-Normal.  Head and Neck Head-normocephalic, atraumatic with no lesions or palpable masses. Trachea-midline. Thyroid Gland Characteristics - normal size and consistency.  Eye Eyeball - Bilateral-Extraocular movements intact. Sclera/Conjunctiva - Bilateral-No scleral icterus.  Chest and Lung Exam Chest and lung exam reveals -quiet, even and easy respiratory effort with no use of accessory muscles and on auscultation, normal breath sounds, no adventitious sounds and normal vocal resonance. Inspection Chest Wall - Normal. Back - normal.  Breast Note: breasts are relatively symmetric. They are also pretty dens  throughout. The right breast has a hematoma inferiorly associated wtih a mass like process around 3 cm in diameter. This is mobile. No nipple discharge or nipple retraction is seen. no palpable lymph adenopathy is present. The left side is also dense, but without masses. no other findings are present on the left.   Cardiovascular Cardiovascular examination reveals -normal heart sounds, regular rate and rhythm with no murmurs and normal pedal pulses bilaterally.  Abdomen Inspection Inspection of the abdomen reveals - No Hernias. Palpation/Percussion Palpation and Percussion of the abdomen reveal - Soft, Non Tender, No Rebound tenderness, No Rigidity (guarding) and No hepatosplenomegaly. Auscultation Auscultation of the abdomen reveals - Bowel sounds normal.  Neurologic Neurologic evaluation reveals -alert and oriented x 3 with no impairment of recent or remote memory. Mental Status-Normal.  Musculoskeletal Global Assessment -Note: no gross deformities.  Normal Exam - Left-Upper Extremity Strength Normal and Lower Extremity Strength Normal. Normal Exam - Right-Upper Extremity Strength Normal and Lower Extremity Strength Normal.  Lymphatic Head & Neck  General Head & Neck Lymphatics: Bilateral - Description - Normal. Axillary  General Axillary Region: Bilateral - Description - Normal. Tenderness - Non Tender. Femoral & Inguinal  Generalized Femoral & Inguinal Lymphatics: Bilateral - Description - No Generalized lymphadenopathy.    Assessment & Plan Tammy Klein MD; 10/11/2018 3:11 PM) MALIGNANT NEOPLASM OF LOWER-OUTER QUADRANT OF RIGHT BREAST OF FEMALE, ESTROGEN RECEPTOR POSITIVE (C50.511) Impression: Pt has a new diagnosis of cT2N0 right breast cancer. We will plan to get an MRI due to her breast density. Also, her mammogram last year was totally normal. I counseled the patient that MRIs lead to additional biopsies often.  If we do not get any surprises, will plan  up front surgery wtih right seed localized lumpectomy wtih SLN bx. This would be followed by an oncotype or mammaprint, radiation, and antiestrogen therapy.  I discussed the surgery with the patient and her  family (sister/husband). I reviewed risks of additional surgery and possible port placement if oncotype is high.  MRI is scheduled for 12/13. She has a genetics appt tomorrow.  The surgical procedure was described to the patient. I discussed the incision type and location and that we would need radiology involved on with a wire or seed marker and/or sentinel node.  The risks and benefits of the procedure were described to the patient and she wishes to proceed.  We discussed the risks bleeding, infection, damage to other structures, need for further procedures/surgeries. We discussed the risk of seroma. The patient was advised if the area in the breast in cancer, we may need to go back to surgery for additional tissue to obtain negative margins or for a lymph node biopsy. The patient was advised that these are the most common complications, but that others can occur as well. They were advised against taking aspirin or other anti-inflammatory agents/blood thinners the week before surgery.    Signed by Tammy Klein, MD (10/11/2018 3:15 PM)

## 2018-11-09 ENCOUNTER — Encounter (HOSPITAL_BASED_OUTPATIENT_CLINIC_OR_DEPARTMENT_OTHER): Payer: Self-pay | Admitting: General Surgery

## 2018-11-09 ENCOUNTER — Telehealth: Payer: Self-pay | Admitting: *Deleted

## 2018-11-09 NOTE — Telephone Encounter (Signed)
Received Needle Localization; seed localization for recent diagnosis of Breast Cancer. Right breast, inner lower quadrant/SLS 01/02

## 2018-11-13 ENCOUNTER — Other Ambulatory Visit: Payer: Self-pay | Admitting: Oncology

## 2018-11-13 ENCOUNTER — Telehealth: Payer: Self-pay | Admitting: *Deleted

## 2018-11-13 NOTE — Telephone Encounter (Signed)
Ordered oncotype per Dr. Magrinat.  Faxed requisition to pathology. 

## 2018-11-23 ENCOUNTER — Telehealth: Payer: Self-pay | Admitting: *Deleted

## 2018-11-23 ENCOUNTER — Encounter: Payer: Self-pay | Admitting: Family Medicine

## 2018-11-23 DIAGNOSIS — C50511 Malignant neoplasm of lower-outer quadrant of right female breast: Secondary | ICD-10-CM

## 2018-11-23 DIAGNOSIS — Z170 Estrogen receptor positive status [ER+]: Principal | ICD-10-CM

## 2018-11-23 NOTE — Telephone Encounter (Signed)
Received oncotype score of 10/3%. Patient aware.  Referral made to Dr. Lisbeth Renshaw.

## 2018-11-27 ENCOUNTER — Encounter (HOSPITAL_COMMUNITY): Payer: Self-pay | Admitting: Oncology

## 2018-11-30 ENCOUNTER — Ambulatory Visit: Payer: Medicare Other | Attending: General Surgery | Admitting: Physical Therapy

## 2018-11-30 ENCOUNTER — Other Ambulatory Visit: Payer: Self-pay

## 2018-11-30 ENCOUNTER — Encounter: Payer: Self-pay | Admitting: Physical Therapy

## 2018-11-30 DIAGNOSIS — C50511 Malignant neoplasm of lower-outer quadrant of right female breast: Secondary | ICD-10-CM | POA: Diagnosis not present

## 2018-11-30 DIAGNOSIS — Z170 Estrogen receptor positive status [ER+]: Secondary | ICD-10-CM | POA: Diagnosis not present

## 2018-11-30 DIAGNOSIS — R293 Abnormal posture: Secondary | ICD-10-CM | POA: Diagnosis not present

## 2018-11-30 DIAGNOSIS — M25611 Stiffness of right shoulder, not elsewhere classified: Secondary | ICD-10-CM

## 2018-11-30 DIAGNOSIS — Z483 Aftercare following surgery for neoplasm: Secondary | ICD-10-CM | POA: Diagnosis not present

## 2018-11-30 NOTE — Patient Instructions (Signed)
Closed Chain: Shoulder Flexion / Extension - on Wall    Hands on wall, step backward. Return. Stepping causes shoulder flexion and extension Do _5__ times, holding 10 seconds, _2__ times per day.  http://ss.exer.us/265   Copyright  VHI. All rights reserved.  Closed Chain: Shoulder Abduction / Adduction - on Wall    One hand on wall, step to side and return. Stepping causes shoulder to abduct and adduct. Do _5__ times, holding 10 seconds, _2__ times per day. http://ss.exer.us/267   Copyright  VHI. All rights reserved.

## 2018-11-30 NOTE — Therapy (Signed)
[place], [place], [address] Phone: [phone]   Fax:  [phone]  Physical Therapy Treatment  Patient Details  Name: Tammy Grimes MRN: [id number] Date of Birth: 07/07/1946 Referring Provider (PT): Dr. [name]   Encounter Date: 11/30/2018  PT End of Session - 11/30/18 1057    Visit Number  2    Number of Visits  10    Date for PT Re-Evaluation  12/28/18    PT Start Time  3748    PT Stop Time  1105    PT Time Calculation (min)  50 min    Activity Tolerance  Patient tolerated treatment well    Behavior During Therapy  [facility] for tasks assessed/performed       Past Medical History:  Diagnosis Date  . Allergy   . Anemia    prior to hysterectomy  . BCC (basal cell carcinoma) 08/01/2015  . Breast cancer ([facility]) 09/25/2018   right  . Cancer ([facility]) 2007   calf of right leg- melanoma  . Cataract   . Cervical cancer screening 06/06/2012  . DDD (degenerative disc disease), cervical 08/01/2015  . Family history of bladder cancer   . Family history of breast cancer   . Family history of colon cancer   . Family history of ovarian cancer   . Family history of prostate cancer   . GERD (gastroesophageal reflux disease)   . Hyperglycemia 03/28/2017  . Hyperlipidemia   . Hypertension 65  . Hypothyroidism   . Knee pain 03/30/2012   L>R  . Low back pain 03/23/2018  . Medicare annual wellness visit, subsequent 02/09/2015  . Melanoma of skin ([place]) 08/19/2016  . PONV (postoperative nausea and vomiting)   . Preventative health care 03/30/2012  . Thyroid disease 64  . Vitamin D deficiency 06/06/2012    Past Surgical History:  Procedure Laterality Date  . ABDOMINAL HYSTERECTOMY  1990's   total, for heavy bleeding and fibroids  . BASAL CELL CARCINOMA EXCISION    . BREAST LUMPECTOMY WITH RADIOACTIVE SEED AND SENTINEL LYMPH NODE BIOPSY Right 11/07/2018   Procedure: RIGHT BREAST LUMPECTOMY WITH RADIOACTIVE  SEED AND SENTINEL LYMPH NODE BIOPSY;  Surgeon: [name], MD;  Location: [facility];  Service: General;  Laterality: Right;  . BREAST SURGERY  early 70's   fibroid tumors removed- benign  . CATARACT EXTRACTION Bilateral 12/09/12  . COLONOSCOPY    . EYE SURGERY Bilateral 2015   cataracts  . MELANOMA EXCISION  2007    There were no vitals filed for this visit.  Subjective Assessment - 11/30/18 1024    Subjective  Patient reports she underwent a right lumpectomy and sentinel node biopsy (0/7 nodes positive) on 11/07/18. Oncotype score was low so no need for chemo but will see Dr. Lisbeth Renshaw for radiation on 12/07/2018.     Pertinent History  Patient was diagnosed on 09/11/18 with right grade III invasive ductal carcinoma breast cancer. She underwent right lumpectomy and sentinel node biopsy (0/7 nodes positive) on 11/07/18. It is ER/PR positive and HER2 negative with a Ki67 of 15%.     Patient Stated Goals  Make sure my arm is ok    Currently in Pain?  Yes    Pain Score  3     Pain Location  --   Latissimus   Pain Orientation  Right    Pain Descriptors / Indicators  Tightness;Discomfort    Pain Type  Surgical pain  Pain Onset  1 to 4 weeks ago    Pain Frequency  Intermittent    Aggravating Factors   Using arm    Pain Relieving Factors  Rest         [facility] PT Assessment - 11/30/18 0001      Assessment   Medical Diagnosis  s/p right lumpectomy and SLNB    Referring Provider (PT)  Dr. [name]    Onset Date/Surgical Date  11/07/18    Hand Dominance  Left    Prior Therapy  Baselines      Precautions   Precautions  Other (comment)    Precaution Comments  recent surgery      Restrictions   Weight Bearing Restrictions  No      Balance Screen   Has the patient fallen in the past 6 months  No    Has the patient had a decrease in activity level because of a fear of falling?   No    Is the patient reluctant to leave their home because of a fear of falling?   No       Home Film/video editor residence    Living Arrangements  Spouse/significant other    Available Help at Discharge  Family      Prior Function   Level of [place]  Retired    Leisure  She has returned to the bike and has done it for 20 minutes and has done some stretches      Cognition   Overall Cognitive Status  Within Functional Limits for tasks assessed      Posture/Postural Control   Posture/Postural Control  Postural limitations    Postural Limitations  Rounded Shoulders;Forward head      ROM / Strength   AROM / PROM / Strength  AROM      AROM   AROM Assessment Site  Shoulder    Right/Left Shoulder  Right    Right Shoulder Extension  48 Degrees    Right Shoulder Flexion  142 Degrees    Right Shoulder ABduction  155 Degrees    Right Shoulder Internal Rotation  77 Degrees    Right Shoulder External Rotation  90 Degrees      Strength   Overall Strength  Within functional limits for tasks performed        LYMPHEDEMA/ONCOLOGY QUESTIONNAIRE - 11/30/18 1039      Type   Cancer Type  Right breast cancer      Surgeries   Lumpectomy Date  11/07/18    Sentinel Lymph Node Biopsy Date  11/07/18    Number Lymph Nodes Removed  7      Treatment   Active Chemotherapy Treatment  No    Past Chemotherapy Treatment  No    Active Radiation Treatment  No    Past Radiation Treatment  No    Current Hormone Treatment  No    Past Hormone Therapy  No      What other symptoms do you have   Are you Having Heaviness or Tightness  Yes    Are you having Pain  No    Are you having pitting edema  No    Is it Hard or Difficult finding clothes that fit  No    Do you have infections  No    Is there Decreased scar mobility  No    Stemmer Sign  No  Lymphedema Assessments   Lymphedema Assessments  Upper extremities      Right Upper Extremity Lymphedema   10 cm Proximal to Olecranon Process  27 cm    Olecranon Process  23.2 cm     10 cm Proximal to Ulnar Styloid Process  20.4 cm    Just Proximal to Ulnar Styloid Process  14.6 cm    Across Hand at [employer]  15.6 cm    At [place] of 2nd Digit  5.4 cm      Left Upper Extremity Lymphedema   10 cm Proximal to Olecranon Process  27.6 cm    Olecranon Process  23.3 cm    10 cm Proximal to Ulnar Styloid Process  19.8 cm    Just Proximal to Ulnar Styloid Process  14.8 cm    Across Hand at [employer]  16.6 cm    At [place] of 2nd Digit  5.6 cm        Quick Dash - 11/30/18 0001    Open a tight or new jar  Mild difficulty    Do heavy household chores (wash walls, wash floors)  Moderate difficulty    Carry a shopping bag or briefcase  No difficulty    Wash your back  No difficulty    Use a knife to cut food  No difficulty    Recreational activities in which you take some force or impact through your arm, shoulder, or hand (golf, hammering, tennis)  Mild difficulty    During the past week, to what extent has your arm, shoulder or hand problem interfered with your normal social activities with family, friends, neighbors, or groups?  Not at all    During the past week, to what extent has your arm, shoulder or hand problem limited your work or other regular daily activities  Not at all    Arm, shoulder, or hand pain.  Mild    Tingling (pins and needles) in your arm, shoulder, or hand  Mild    Difficulty Sleeping  Mild difficulty    DASH Score  15.91 %                     PT Education - 11/30/18 1057    Education Details  Lymphedema education and risk reduction; shoulder ROM exercises closed chain    Methods  Explanation;Demonstration;Handout    Comprehension  Returned demonstration;Verbalized understanding          PT Long Term Goals - 11/30/18 1155      PT LONG TERM GOAL #1   Title  Patient will demonstrate she has regained full shoulder ROM and function post operatively compared to baseline measurements.    Time  4    Period  Weeks    Status   Partially Met      PT LONG TERM GOAL #2   Title  Patient will increase right shoulder flexion to >/= 158 degrees for increased ease reaching overhead.    Time  4    Period  Weeks    Status  New      PT LONG TERM GOAL #3   Title  Patient will increase right shoulder abduction to >/= 165 degrees for increased ease reaching overhead.    Time  4    Period  Weeks    Status  New      PT LONG TERM GOAL #4   Title  Patient will improve her DASH score to </= 8  for increased right arm function.    Time  4    Period  Weeks    Status  New      PT LONG TERM GOAL #5   Title  Patient will erbalize good understanding of lymphedema risk reduction practices.    Time  4    Period  Weeks    Status  New            Plan - 11/30/18 1100    Clinical Impression Statement  Patient is doing very well s/p right lumpectomy and sentinel node biopsy on 11/07/18. She had 7 axillary nodes removed and all were negative. She is lacking some shoulder flexion and abduction post operatively and will benefit from PT for several visits to achieve baseline measurements before starting radiation.    History and Personal Factors relevant to plan of care:  .    Rehab Potential  Excellent    Clinical Impairments Affecting Rehab Potential  None    PT Frequency  2x / week    PT Duration  4 weeks    PT Treatment/Interventions  ADLs/Self Care Home Management;Therapeutic exercise;Patient/family education;Manual lymph drainage;Passive range of motion;Manual techniques    PT Next Visit Plan  PROM and ROM exercises for right shoulder    PT Home Exercise Plan  Post op shoulder ROM HEP    Consulted and Agree with Plan of Care  Patient       Patient will benefit from skilled therapeutic intervention in order to improve the following deficits and impairments:  Postural dysfunction, Decreased range of motion, Decreased knowledge of precautions, Pain, Impaired UE functional use, Increased fascial restricitons  Visit  Diagnosis: Malignant neoplasm of lower-outer quadrant of right breast of female, estrogen receptor positive ([facility]) - Plan: PT plan of care cert/re-cert  Abnormal posture - Plan: PT plan of care cert/re-cert  Aftercare following surgery for neoplasm - Plan: PT plan of care cert/re-cert  Stiffness of right shoulder, not elsewhere classified - Plan: PT plan of care cert/re-cert     Problem List Patient Active Problem List   Diagnosis Date Noted  . Genetic testing 10/25/2018  . Family history of breast cancer   . Family history of ovarian cancer   . Family history of prostate cancer   . Family history of colon cancer   . Family history of bladder cancer   . Malignant neoplasm of lower-outer quadrant of right breast of female, estrogen receptor positive ([facility]) 10/04/2018  . Knee pain, bilateral 03/23/2018  . Atypical chest pain 05/07/2017  . Hyperglycemia 03/28/2017  . Shortness of breath 03/28/2017  . Melanoma of skin ([facility]) 08/19/2016  . BCC (basal cell carcinoma) 08/01/2015  . DDD (degenerative disc disease), cervical 08/01/2015  . Medicare annual wellness visit, subsequent 02/09/2015  . Cervical cancer screening 06/06/2012  . Osteopenia 06/06/2012  . Vitamin D deficiency 06/06/2012  . Preventative health care 03/30/2012  . Knee pain 03/30/2012  . Thyroid disease   . GERD (gastroesophageal reflux disease)   . Essential hypertension   . Anemia   . Allergy   . Hyperlipidemia     Annia Friendly, [place] 11/30/18 12:19 PM   [place], [place], [address] Phone: [phone]   Fax:  [phone]  Name: Tammy Grimes MRN: [id number] Date of Birth: 1946-10-23

## 2018-12-04 ENCOUNTER — Ambulatory Visit: Payer: Medicare Other

## 2018-12-04 DIAGNOSIS — Z483 Aftercare following surgery for neoplasm: Secondary | ICD-10-CM | POA: Diagnosis not present

## 2018-12-04 DIAGNOSIS — M25611 Stiffness of right shoulder, not elsewhere classified: Secondary | ICD-10-CM

## 2018-12-04 DIAGNOSIS — C50511 Malignant neoplasm of lower-outer quadrant of right female breast: Secondary | ICD-10-CM | POA: Diagnosis not present

## 2018-12-04 DIAGNOSIS — R293 Abnormal posture: Secondary | ICD-10-CM

## 2018-12-04 DIAGNOSIS — Z170 Estrogen receptor positive status [ER+]: Secondary | ICD-10-CM | POA: Diagnosis not present

## 2018-12-04 NOTE — Therapy (Signed)
[place], [place], [address] Phone: [phone]   Fax:  [phone]  Physical Therapy Treatment  Patient Details  Name: Tammy Grimes MRN: [id number] Date of Birth: [birth year] Referring Provider (PT): Dr. [name]   Encounter Date: 12/04/2018  PT End of Session - 12/04/18 1113    Visit Number  3    Number of Visits  10    Date for PT Re-Evaluation  12/28/18    PT Start Time  1017    PT Stop Time  1107    PT Time Calculation (min)  50 min    Activity Tolerance  Patient tolerated treatment well    Behavior During Therapy  [facility] for tasks assessed/performed       Past Medical History:  Diagnosis Date  . Allergy   . Anemia    prior to hysterectomy  . BCC (basal cell carcinoma) 08/01/2015  . Breast cancer ([facility]) 09/25/2018   right  . Cancer ([place]) 2007   calf of right leg- melanoma  . Cataract   . Cervical cancer screening 06/06/2012  . DDD (degenerative disc disease), cervical 08/01/2015  . Family history of bladder cancer   . Family history of breast cancer   . Family history of colon cancer   . Family history of ovarian cancer   . Family history of prostate cancer   . GERD (gastroesophageal reflux disease)   . Hyperglycemia 03/28/2017  . Hyperlipidemia   . Hypertension 65  . Hypothyroidism   . Knee pain 03/30/2012   L>R  . Low back pain 03/23/2018  . Medicare annual wellness visit, subsequent 02/09/2015  . Melanoma of skin ([place]) 08/19/2016  . PONV (postoperative nausea and vomiting)   . Preventative health care 03/30/2012  . Thyroid disease 64  . Vitamin D deficiency 06/06/2012    Past Surgical History:  Procedure Laterality Date  . ABDOMINAL HYSTERECTOMY  1990's   total, for heavy bleeding and fibroids  . BASAL CELL CARCINOMA EXCISION    . BREAST LUMPECTOMY WITH RADIOACTIVE SEED AND SENTINEL LYMPH NODE BIOPSY Right 11/07/2018   Procedure: RIGHT BREAST LUMPECTOMY WITH RADIOACTIVE  SEED AND SENTINEL LYMPH NODE BIOPSY;  Surgeon: [name], MD;  Location: [facility];  Service: General;  Laterality: Right;  . BREAST SURGERY  early 70's   fibroid tumors removed- benign  . CATARACT EXTRACTION Bilateral 12/09/12  . COLONOSCOPY    . EYE SURGERY Bilateral 2015   cataracts  . MELANOMA EXCISION  2007    There were no vitals filed for this visit.  Subjective Assessment - 12/04/18 1019    Subjective  Just want to get the last litle bit of motion back in my arm.     Pertinent History  Patient was diagnosed on 09/11/18 with right grade III invasive ductal carcinoma breast cancer. She underwent right lumpectomy and sentinel node biopsy (0/7 nodes positive) on 11/07/18. It is ER/PR positive and HER2 negative with a Ki67 of 15%.     Patient Stated Goals  Make sure my arm is ok    Currently in Pain?  No/denies                       [facility] Adult PT Treatment/Exercise - 12/04/18 0001      Manual Therapy   Manual Therapy  Myofascial release;Passive ROM    Myofascial Release  In Supine to Rt axilla during  P/RO M but avoiding still healing  incisions; UE pulling duing P/ROM    Passive ROM  In Supine to Rt shoulder into Rt shoulder flexion, abduction an dD2 to pts [place], alos radiation positioning (simulation is Thursday); pt required multiple VCs throughout to relax                  PT Long Term Goals - 11/30/18 1155      PT LONG TERM GOAL #1   Title  Patient will demonstrate she has regained full shoulder ROM and function post operatively compared to baseline measurements.    Time  4    Period  Weeks    Status  Partially Met      PT LONG TERM GOAL #2   Title  Patient will increase right shoulder flexion to >/= 158 degrees for increased ease reaching overhead.    Time  4    Period  Weeks    Status  New      PT LONG TERM GOAL #3   Title  Patient will increase right shoulder abduction to >/= 165 degrees for increased ease reaching  overhead.    Time  4    Period  Weeks    Status  New      PT LONG TERM GOAL #4   Title  Patient will improve her DASH score to </= 8 for increased right arm function.    Time  4    Period  Weeks    Status  New      PT LONG TERM GOAL #5   Title  Patient will erbalize good understanding of lymphedema risk reduction practices.    Time  4    Period  Weeks    Status  New            Plan - 12/04/18 1115    Clinical Impression Statement  Pt tolerated first session of manual therapy vey well though did require multiple VCs to relax during session. Educated pt throughout session how to incorporate stretches into her ADLs , including use of the mirror to work on decreasing Rt scapular compensation. Pt verbalized understanding of all. She has her radiation simulation Thursday and should be able to attain position for this.      Rehab Potential  Excellent    Clinical Impairments Affecting Rehab Potential  None    PT Frequency  2x / week    PT Duration  4 weeks    PT Treatment/Interventions  ADLs/Self Care Home Management;Therapeutic exercise;Patient/family education;Manual lymph drainage;Passive range of motion;Manual techniques    PT Next Visit Plan  Cont PROM and ROM exercises for right shoulder; add supine scapular series and see how stretching at home is going/decreasing compensations at end of motion?    Consulted and Agree with Plan of Care  Patient       Patient will benefit from skilled therapeutic intervention in order to improve the following deficits and impairments:  Postural dysfunction, Decreased range of motion, Decreased knowledge of precautions, Pain, Impaired UE functional use, Increased fascial restricitons  Visit Diagnosis: Malignant neoplasm of lower-outer quadrant of right breast of female, estrogen receptor positive ([facility])  Abnormal posture  Aftercare following surgery for neoplasm  Stiffness of right shoulder, not elsewhere classified     Problem  List Patient Active Problem List   Diagnosis Date Noted  . Genetic testing 10/25/2018  . Family history of breast cancer   . Family history of ovarian cancer   . Family history of prostate cancer   .  Family history of colon cancer   . Family history of bladder cancer   . Malignant neoplasm of lower-outer quadrant of right breast of female, estrogen receptor positive ([facility]) 10/04/2018  . Knee pain, bilateral 03/23/2018  . Atypical chest pain 05/07/2017  . Hyperglycemia 03/28/2017  . Shortness of breath 03/28/2017  . Melanoma of skin ([place]) 08/19/2016  . BCC (basal cell carcinoma) 08/01/2015  . DDD (degenerative disc disease), cervical 08/01/2015  . Medicare annual wellness visit, subsequent 02/09/2015  . Cervical cancer screening 06/06/2012  . Osteopenia 06/06/2012  . Vitamin D deficiency 06/06/2012  . Preventative health care 03/30/2012  . Knee pain 03/30/2012  . Thyroid disease   . GERD (gastroesophageal reflux disease)   . Essential hypertension   . Anemia   . Allergy   . Hyperlipidemia     Otelia Limes, PTA 12/04/2018, 11:29 AM  [place], [place], [address] Phone: [phone]   Fax:  [phone]  Name: Pammie Chirino Hehl MRN: [id number] Date of Birth: 15-Jul-1946

## 2018-12-06 ENCOUNTER — Ambulatory Visit: Payer: Medicare Other

## 2018-12-06 DIAGNOSIS — Z170 Estrogen receptor positive status [ER+]: Secondary | ICD-10-CM | POA: Diagnosis not present

## 2018-12-06 DIAGNOSIS — M25611 Stiffness of right shoulder, not elsewhere classified: Secondary | ICD-10-CM

## 2018-12-06 DIAGNOSIS — Z483 Aftercare following surgery for neoplasm: Secondary | ICD-10-CM

## 2018-12-06 DIAGNOSIS — C50511 Malignant neoplasm of lower-outer quadrant of right female breast: Secondary | ICD-10-CM | POA: Diagnosis not present

## 2018-12-06 DIAGNOSIS — R293 Abnormal posture: Secondary | ICD-10-CM

## 2018-12-06 NOTE — Progress Notes (Signed)
Location of Breast Cancer: Rt Breast  Histology per Pathology Report: 11/07/18 1. Breast, lumpectomy, Right w/seed - INVASIVE DUCTAL CARCINOMA, GRADE 3, 2.1 CM IN MAXIMUM EXTENT, INVOLVING THE CLOSEST (ANTERIOR) RESECTION MARGIN. - DUCTAL CARCINOMA IN SITU, APPROACHING TO 0.3 CM OF THE CLOSEST (INFERIOR) RESECTION MARGIN. 2. Lymph node, sentinel, biopsy, Right axillary #1 - NO METASTATIC CARCINOMA IDENTIFIED IN ONE LYMPH NODE. 3. Lymph node, sentinel, biopsy, SLN Right - NO METASTATIC CARCINOMA IDENTIFIED IN ONE LYMPH NODE. 4. Lymph node, sentinel, biopsy, Right axillary #2 - NO METASTATIC CARCINOMA IDENTIFIED IN ONE LYMPH NODE. 5. Lymph node, sentinel, biopsy, SLN Right - NO METASTATIC CARCINOMA IDENTIFIED IN ONE LYMPH NODE. 6. Lymph node, sentinel, biopsy, Right axillary #3 - NO METASTATIC CARCINOMA IDENTIFIED IN ONE LYMPH NODE. 7. Lymph node, sentinel, biopsy, SLN Right - NO METASTATIC CARCINOMA IDENTIFIED IN ONE LYMPH NODE. 8. Lymph node, sentinel, biopsy, SLN Right - NO METASTATIC CARCINOMA IDENTIFIED IN ONE LYMPH NODE.  Receptor Status:  11/07/18 Estrogen Receptor: 95%, positive, strong staining intensity Progesterone Receptor: 95%, positive, strong staining intensity HER2: Negative (1+) ki-67: 15%  Did patient present with symptoms (if so, please note symptoms) or was this found on screening mammography?: It was discovered on a screening mammogram.   Past/Anticipated interventions by surgeon, if any: 11/07/18 Dr [name]  RIGHT BREAST LUMPECTOMY WITH RADIOACTIVE SEED AND SENTINEL LYMPH NODE BIOPSY  Past/Anticipated interventions by medical oncology, if any: Dr. Jana Hakim at the [facility]. She will see him after completion of radiation.   Lymphedema issues, if any: She denies. She reports good arm mobility.   Pain issues, if any: She reports soreness to her upper right arm and her incision site. She is taking Tylenol with relief.   SAFETY ISSUES:  Prior  radiation? No  Pacemaker/ICD? No  Possible current pregnancy? No  Is the patient on methotrexate? No  Current Complaints / other details:  BP (!) 143/75 (BP Location: Left Arm, Patient Position: Sitting)   Pulse 68   Temp 98.2 F (36.8 C) (Oral)   Resp 20   Ht 5' 2"  (1.575 m)   Wt 137 lb 9.6 oz (62.4 kg)   SpO2 98%   BMI 25.17 kg/m     Wt Readings from Last 3 Encounters:  12/07/18 137 lb 9.6 oz (62.4 kg)  11/07/18 139 lb 15.9 oz (63.5 kg)  10/11/18 143 lb 4.8 oz (65 kg)   Mardene Sayer, LPN 5/39/1225,8:34 AM

## 2018-12-06 NOTE — Patient Instructions (Signed)

## 2018-12-06 NOTE — Therapy (Addendum)
[place], [place], [address] Phone: [phone]   Fax:  [phone]  Physical Therapy Treatment  Patient Details  Name: Tammy Grimes MRN: [id number] Date of Birth: 1945-12-04 Referring Provider (PT): Dr. [name]   Encounter Date: 12/06/2018  PT End of Session - 12/06/18 1558    Visit Number  4    Number of Visits  10    Date for PT Re-Evaluation  12/28/18    PT Start Time  2035    PT Stop Time  1603    PT Time Calculation (min)  40 min    Activity Tolerance  Patient tolerated treatment well    Behavior During Therapy  [facility] for tasks assessed/performed       Past Medical History:  Diagnosis Date  . Allergy   . Anemia    prior to hysterectomy  . BCC (basal cell carcinoma) 08/01/2015  . Breast cancer ([place]) 09/25/2018   right  . Cancer ([place]) 2007   calf of right leg- melanoma  . Cataract   . Cervical cancer screening 06/06/2012  . DDD (degenerative disc disease), cervical 08/01/2015  . Family history of bladder cancer   . Family history of breast cancer   . Family history of colon cancer   . Family history of ovarian cancer   . Family history of prostate cancer   . GERD (gastroesophageal reflux disease)   . Hyperglycemia 03/28/2017  . Hyperlipidemia   . Hypertension 65  . Hypothyroidism   . Knee pain 03/30/2012   L>R  . Low back pain 03/23/2018  . Medicare annual wellness visit, subsequent 02/09/2015  . Melanoma of skin ([place]) 08/19/2016  . PONV (postoperative nausea and vomiting)   . Preventative health care 03/30/2012  . Thyroid disease 64  . Vitamin D deficiency 06/06/2012    Past Surgical History:  Procedure Laterality Date  . ABDOMINAL HYSTERECTOMY  1990's   total, for heavy bleeding and fibroids  . BASAL CELL CARCINOMA EXCISION    . BREAST LUMPECTOMY WITH RADIOACTIVE SEED AND SENTINEL LYMPH NODE BIOPSY Right 11/07/2018   Procedure: RIGHT BREAST LUMPECTOMY WITH RADIOACTIVE  SEED AND SENTINEL LYMPH NODE BIOPSY;  Surgeon: [name], MD;  Location: [facility];  Service: General;  Laterality: Right;  . BREAST SURGERY  early 70's   fibroid tumors removed- benign  . CATARACT EXTRACTION Bilateral 12/09/12  . COLONOSCOPY    . EYE SURGERY Bilateral 2015   cataracts  . MELANOMA EXCISION  2007    There were no vitals filed for this visit.  Subjective Assessment - 12/06/18 1525    Subjective  I was a little achy after last visit but then I could tell a real improvement with my ROM. It really helped.     Pertinent History  Patient was diagnosed on 09/11/18 with right grade III invasive ductal carcinoma breast cancer. She underwent right lumpectomy and sentinel node biopsy (0/7 nodes positive) on 11/07/18. It is ER/PR positive and HER2 negative with a Ki67 of 15%.     Patient Stated Goals  Make sure my arm is ok    Currently in Pain?  No/denies         [facility] PT Assessment - 12/06/18 0001      AROM   Right Shoulder Flexion  168 Degrees    Right Shoulder ABduction  171 Degrees  [facility] Adult PT Treatment/Exercise - 12/06/18 0001      Shoulder Exercises: Supine   Horizontal ABduction  Strengthening;Both;10 reps;Theraband    Theraband Level (Shoulder Horizontal ABduction)  Level 2 (Red)    External Rotation  Strengthening;Both;10 reps;Theraband    Theraband Level (Shoulder External Rotation)  Level 2 (Red)    Flexion  Strengthening;Both;10 reps;Theraband   Narrow and Wide Grip, 10 times each   Theraband Level (Shoulder Flexion)  Level 2 (Red)    Diagonals  Strengthening;Right;Left;5 reps;Theraband    Theraband Level (Shoulder Diagonals)  Level 2 (Red)      Manual Therapy   Myofascial Release  In Supine to Rt axilla during  P/RO M but avoiding still healing incisions; UE pulling duing P/ROM    Passive ROM  In Supine to Rt shoulder into Rt shoulder flexion, abduction an dD2 to pts [place], alos radiation positioning  (simulation is Thursday); pt required multiple VCs throughout to relax             PT Education - 12/06/18 1557    Education Details  Supine scapular series with red theraband    Person(s) Educated  Patient    Methods  Explanation;Demonstration;Handout    Comprehension  Verbalized understanding;Returned demonstration          PT Long Term Goals - 11/30/18 1155      PT LONG TERM GOAL #1   Title  Patient will demonstrate she has regained full shoulder ROM and function post operatively compared to baseline measurements.    Time  4    Period  Weeks    Status  Partially Met      PT LONG TERM GOAL #2   Title  Patient will increase right shoulder flexion to >/= 158 degrees for increased ease reaching overhead.    Time  4    Period  Weeks    Status  New      PT LONG TERM GOAL #3   Title  Patient will increase right shoulder abduction to >/= 165 degrees for increased ease reaching overhead.    Time  4    Period  Weeks    Status  New      PT LONG TERM GOAL #4   Title  Patient will improve her DASH score to </= 8 for increased right arm function.    Time  4    Period  Weeks    Status  New      PT LONG TERM GOAL #5   Title  Patient will erbalize good understanding of lymphedema risk reduction practices.    Time  4    Period  Weeks    Status  New            Plan - 12/06/18 1600    Clinical Impression Statement  Continued with manual therapy focusing on end ROM of Rt shoulder. Also progressed HEP to include postural strength with supine scapular series which pt tolerated very well.and was able to return good demo of this.     Rehab Potential  Excellent    Clinical Impairments Affecting Rehab Potential  None    PT Frequency  2x / week    PT Duration  4 weeks    PT Treatment/Interventions  ADLs/Self Care Home Management;Therapeutic exercise;Patient/family education;Manual lymph drainage;Passive range of motion;Manual techniques    PT Next Visit Plan  Cont PROM and  ROM exercises for right shoulder; review supine scapular series and see how stretching at home is going/decreasing  compensations at end of motion?    Consulted and Agree with Plan of Care  Patient       Patient will benefit from skilled therapeutic intervention in order to improve the following deficits and impairments:  Postural dysfunction, Decreased range of motion, Decreased knowledge of precautions, Pain, Impaired UE functional use, Increased fascial restricitons  Visit Diagnosis: Malignant neoplasm of lower-outer quadrant of right breast of female, estrogen receptor positive (HCC)  Abnormal posture  Aftercare following surgery for neoplasm  Stiffness of right shoulder, not elsewhere classified     Problem List Patient Active Problem List   Diagnosis Date Noted  . Genetic testing 10/25/2018  . Family history of breast cancer   . Family history of ovarian cancer   . Family history of prostate cancer   . Family history of colon cancer   . Family history of bladder cancer   . Malignant neoplasm of lower-outer quadrant of right breast of female, estrogen receptor positive ([place]) 10/04/2018  . Knee pain, bilateral 03/23/2018  . Atypical chest pain 05/07/2017  . Hyperglycemia 03/28/2017  . Shortness of breath 03/28/2017  . Melanoma of skin ([place]) 08/19/2016  . BCC (basal cell carcinoma) 08/01/2015  . DDD (degenerative disc disease), cervical 08/01/2015  . Medicare annual wellness visit, subsequent 02/09/2015  . Cervical cancer screening 06/06/2012  . Osteopenia 06/06/2012  . Vitamin D deficiency 06/06/2012  . Preventative health care 03/30/2012  . Knee pain 03/30/2012  . Thyroid disease   . GERD (gastroesophageal reflux disease)   . Essential hypertension   . Anemia   . Allergy   . Hyperlipidemia     Otelia Limes, PTA 12/06/2018, 4:12 PM  [place], [place], [address] Phone:  [phone]   Fax:  [phone]  Name: Tammy Grimes MRN: [id number] Date of Birth: 1946-01-05  PHYSICAL THERAPY DISCHARGE SUMMARY  Visits from Start of Care: 4  Current functional level related to goals / functional outcomes: Goals not met as pt did not return for additional PT.   Remaining deficits: Unknown but had made progress and was doing well at her last visit.   Education / Equipment: HEP  Plan: Patient agrees to discharge.  Patient goals were partially met. Patient is being discharged due to not returning since the last visit.  ?????         Annia Friendly, PT 02/08/19 11:00 AM

## 2018-12-07 ENCOUNTER — Other Ambulatory Visit: Payer: Self-pay

## 2018-12-07 ENCOUNTER — Encounter: Payer: Self-pay | Admitting: Radiation Oncology

## 2018-12-07 ENCOUNTER — Ambulatory Visit
Admission: RE | Admit: 2018-12-07 | Discharge: 2018-12-07 | Disposition: A | Discharge status: Home / Self Care | Payer: Medicare Other | Source: Ambulatory Visit | Attending: Radiation Oncology | Admitting: Radiation Oncology

## 2018-12-07 DIAGNOSIS — Z510 Encounter for antineoplastic radiation therapy: Secondary | ICD-10-CM | POA: Insufficient documentation

## 2018-12-07 DIAGNOSIS — C50511 Malignant neoplasm of lower-outer quadrant of right female breast: Secondary | ICD-10-CM | POA: Insufficient documentation

## 2018-12-07 DIAGNOSIS — Z803 Family history of malignant neoplasm of breast: Secondary | ICD-10-CM | POA: Insufficient documentation

## 2018-12-07 DIAGNOSIS — Z9889 Other specified postprocedural states: Secondary | ICD-10-CM | POA: Diagnosis not present

## 2018-12-07 DIAGNOSIS — Z79899 Other long term (current) drug therapy: Secondary | ICD-10-CM | POA: Diagnosis not present

## 2018-12-07 DIAGNOSIS — Z8052 Family history of malignant neoplasm of bladder: Secondary | ICD-10-CM | POA: Diagnosis not present

## 2018-12-07 DIAGNOSIS — M545 Low back pain: Secondary | ICD-10-CM | POA: Insufficient documentation

## 2018-12-07 DIAGNOSIS — Z170 Estrogen receptor positive status [ER+]: Secondary | ICD-10-CM | POA: Insufficient documentation

## 2018-12-07 DIAGNOSIS — E039 Hypothyroidism, unspecified: Secondary | ICD-10-CM | POA: Insufficient documentation

## 2018-12-07 DIAGNOSIS — Z85828 Personal history of other malignant neoplasm of skin: Secondary | ICD-10-CM | POA: Insufficient documentation

## 2018-12-07 DIAGNOSIS — Z85820 Personal history of malignant melanoma of skin: Secondary | ICD-10-CM | POA: Diagnosis not present

## 2018-12-07 DIAGNOSIS — E559 Vitamin D deficiency, unspecified: Secondary | ICD-10-CM | POA: Diagnosis not present

## 2018-12-07 DIAGNOSIS — Z800 Family history of malignant neoplasm of digestive organs: Secondary | ICD-10-CM | POA: Diagnosis not present

## 2018-12-07 DIAGNOSIS — Z801 Family history of malignant neoplasm of trachea, bronchus and lung: Secondary | ICD-10-CM | POA: Diagnosis not present

## 2018-12-07 DIAGNOSIS — Z7982 Long term (current) use of aspirin: Secondary | ICD-10-CM | POA: Diagnosis not present

## 2018-12-07 DIAGNOSIS — I10 Essential (primary) hypertension: Secondary | ICD-10-CM | POA: Insufficient documentation

## 2018-12-07 NOTE — Progress Notes (Signed)
  Radiation Oncology         [phone]) [phone] ________________________________  Name: Tammy Grimes MRN: [id number]  Date: 12/07/2018  DOB: [birth year]   DIAGNOSIS:     ICD-10-CM   1. Malignant neoplasm of lower-outer quadrant of right breast of female, estrogen receptor positive ([place]) C50.511    Z17.0     SIMULATION AND TREATMENT PLANNING NOTE  The patient presented for simulation prior to beginning her course of radiation treatment for her diagnosis of right-sided breast cancer. The patient was placed in a supine position on a breast board. A customized vac-lock bag was constructed and this complex treatment device will be used on a daily basis during her treatment. In this fashion, a CT scan was obtained through the chest area and an isocenter was placed near the chest wall within the breast.  The patient will be planned to receive a course of radiation initially to a dose of 42.56 Gy. This will consist of a whole breast radiotherapy technique. To accomplish this, 2 customized blocks have been designed which will correspond to medial and lateral whole breast tangent fields. This treatment will be accomplished at 2.66 Gy per fraction. A forward planning technique will also be evaluated to determine if this approach improves the plan. It is anticipated that the patient will then receive a 8 Gy boost to the seroma cavity which has been contoured. This will be accomplished at 2 Gy per fraction.   This initial treatment will consist of a 3-D conformal technique. The seroma has been contoured as the primary target structure. Additionally, dose volume histograms of both this target as well as the lungs and heart will also be evaluated. Such an approach is necessary to ensure that the target area is adequately covered while the nearby critical  normal structures are adequately spared.  Plan:  The final anticipated total dose therefore will correspond to 50.56  Gy.    _______________________________   Jodelle Gross, MD, PhD

## 2018-12-07 NOTE — Progress Notes (Signed)
Radiation Oncology         [phone] ________________________________  Name: Tammy Grimes        MRN: [id number]  Date of Service: 12/07/2018 DOB: Sep 07, 1946  JQ:ZESPQ, Bonnita Levan, MD  Magrinat, Virgie Dad, MD     REFERRING PHYSICIAN: Magrinat, Virgie Dad, MD   DIAGNOSIS: The encounter diagnosis was Malignant neoplasm of lower-outer quadrant of right breast of female, estrogen receptor positive ([place]).   HISTORY OF PRESENT ILLNESS: Tammy Grimes is a 73 y.o. female originally seen in the multidisciplinary breast clinic for a new diagnosis of right breast cancer. The patient was noted to have a screening asymmetry in the right breast.  The patient underwent diagnostic imaging revealing a 2.8 cm spiculated appearing mass in the right breast at 6:00 with concerns for possible intraductal extension.  Her axilla was negative for adenopathy.  A biopsy on 09/27/2018 revealed a grade 3 invasive ductal carcinoma.  Her cancer was ER PR positive, HER-2 negative, with a Ki-67 of 15%.  She underwent lumpectomy with sentinel node biopsy on 11/07/18. Her final tumor was a grade 3 and measured 2.1 cm, and her seven sampled nodes were negative. Her margins were positive anteriorly but this was the skin so no additional surgery is planned per Dr. Barry Dienes. She had oncotype Dx score that revealed a score of 10, and no chemotherapy is planned. She comes today to discuss adjuvant radiotherapy.    PREVIOUS RADIATION THERAPY: No   PAST MEDICAL HISTORY:  Past Medical History:  Diagnosis Date  . Allergy   . Anemia    prior to hysterectomy  . BCC (basal cell carcinoma) 08/01/2015  . Breast cancer ([place]) 09/25/2018   right  . Cancer ([place]) 2007   calf of right leg- melanoma  . Cataract   . Cervical cancer screening 06/06/2012  . DDD (degenerative disc disease), cervical 08/01/2015  . Family history of bladder cancer   . Family history of breast cancer   . Family history of colon cancer   . Family history of  ovarian cancer   . Family history of prostate cancer   . GERD (gastroesophageal reflux disease)   . Hyperglycemia 03/28/2017  . Hyperlipidemia   . Hypertension 65  . Hypothyroidism   . Knee pain 03/30/2012   L>R  . Low back pain 03/23/2018  . Medicare annual wellness visit, subsequent 02/09/2015  . Melanoma of skin ([place]) 08/19/2016  . PONV (postoperative nausea and vomiting)   . Preventative health care 03/30/2012  . Thyroid disease 64  . Vitamin D deficiency 06/06/2012       PAST SURGICAL HISTORY: Past Surgical History:  Procedure Laterality Date  . ABDOMINAL HYSTERECTOMY  1990's   total, for heavy bleeding and fibroids  . BASAL CELL CARCINOMA EXCISION    . BREAST LUMPECTOMY WITH RADIOACTIVE SEED AND SENTINEL LYMPH NODE BIOPSY Right 11/07/2018   Procedure: RIGHT BREAST LUMPECTOMY WITH RADIOACTIVE SEED AND SENTINEL LYMPH NODE BIOPSY;  Surgeon: [name], MD;  Location: [facility];  Service: General;  Laterality: Right;  . BREAST SURGERY  early 70's   fibroid tumors removed- benign  . CATARACT EXTRACTION Bilateral 12/09/12  . COLONOSCOPY    . EYE SURGERY Bilateral 2015   cataracts  . MELANOMA EXCISION  2007     FAMILY HISTORY:  Family History  Problem Relation Age of Onset  . Hypertension Mother   . Alzheimer's disease Mother   . Dementia Mother  alzheimer's  . Aortic aneurysm Father   . Hypertension Father   . COPD Father        smoker  . Heart disease Father        s/p bypass, aortic aneurysm, carotid artery disease  . Breast cancer Sister 66  . Diabetes Son 22       type 1  . Alzheimer's disease Maternal Grandmother   . Prostate cancer Maternal Grandfather 60       metastatic  . Stroke Paternal Grandfather   . Ovarian cancer Maternal Aunt 75  . Cancer Maternal Aunt        cervical or uterine  . Colon cancer Paternal Uncle 83  . Breast cancer Cousin 76  . Breast cancer Cousin 59  . Colon cancer Cousin        dx >50  . Esophageal cancer  Neg Hx   . Pancreatic cancer Neg Hx   . Rectal cancer Neg Hx   . Stomach cancer Neg Hx      SOCIAL HISTORY:  reports that she has never smoked. She has never used smokeless tobacco. She reports that she does not drink alcohol or use drugs. The patient is [place] and [place]    ALLERGIES: Penicillins   MEDICATIONS:  Current Outpatient Medications  Medication Sig Dispense Refill  . amLODipine (NORVASC) 5 MG tablet TAKE 1 TABLET(5 MG) BY MOUTH DAILY 90 tablet 1  . aspirin 81 MG tablet Take 81 mg by mouth daily.    . calcium-vitamin D 250-100 MG-UNIT per tablet Take 1 tablet by mouth daily.    Marland Kitchen KRILL OIL PO Take 1 capsule by mouth daily.    . metoprolol tartrate (LOPRESSOR) 25 MG tablet Take 1/2 tablet (12.66m ) by mouth two times a day 90 tablet 1  . omeprazole (PRILOSEC) 20 MG capsule Take 1 capsule (20 mg total) by mouth daily. 90 capsule 3  . rosuvastatin (CRESTOR) 5 MG tablet TAKE 1 TABLET(5 MG) BY MOUTH DAILY. Please make yearly appt with Dr. ESaunders Revelfor July before anymore refills. 1st attempt 90 tablet 1  . SYNTHROID 75 MCG tablet TAKE 1 TABLET(75 MCG) BY MOUTH DAILY BEFORE BREAKFAST 90 tablet 0  . Turmeric 500 MG TABS Take 500 mg by mouth daily.    .Marland KitchenoxyCODONE (OXY IR/ROXICODONE) 5 MG immediate release tablet Take 1 tablet (5 mg total) by mouth every 6 (six) hours as needed for severe pain. (Patient not taking: Reported on 11/30/2018) 15 tablet 0  . ranitidine (ZANTAC) 300 MG tablet Take 1 tablet (300 mg total) by mouth at bedtime. (Patient not taking: Reported on 12/07/2018) 90 tablet 1   No current facility-administered medications for this encounter.      REVIEW OF SYSTEMS: On review of systems, the patient reports that she is doing well overall. She is working with PT for ROM exercises. She denies any chest pain, shortness of breath, cough, fevers, chills, night sweats, unintended weight changes. She denies any bowel or bladder disturbances, and denies abdominal  pain, nausea or vomiting. She denies any new musculoskeletal or joint aches or pains. A complete review of systems is obtained and is otherwise negative.     PHYSICAL EXAM:  Wt Readings from Last 3 Encounters:  12/07/18 137 lb 9.6 oz (62.4 kg)  11/07/18 139 lb 15.9 oz (63.5 kg)  10/11/18 143 lb 4.8 oz (65 kg)   Temp Readings from Last 3 Encounters:  12/07/18 98.2 F (36.8 C) (Oral)  11/07/18 98.6 F (  37 C)  10/11/18 97.8 F (36.6 C) (Oral)   BP Readings from Last 3 Encounters:  12/07/18 (!) 143/75  11/07/18 119/63  10/11/18 (!) 158/75   Pulse Readings from Last 3 Encounters:  12/07/18 68  11/07/18 65  10/11/18 68     In general this is a well appearing caucasian female in no acute distress. She is alert and oriented x4 and appropriate throughout the examination. HEENT reveals that the patient is normocephalic, atraumatic. EOMs are intact. Skin is intact without any evidence of gross lesions. Cardiopulmonary assessment is negative for acute distress and she exhibits normal effort. Her right breast is healing well without erythema.   ECOG = 0  0 - Asymptomatic (Fully active, able to carry on all predisease activities without restriction)  1 - Symptomatic but completely ambulatory (Restricted in physically strenuous activity but ambulatory and able to carry out work of a light or sedentary nature. For example, light housework, office work)  2 - Symptomatic, <50% in bed during the day (Ambulatory and capable of all self care but unable to carry out any work activities. Up and about more than 50% of waking hours)  3 - Symptomatic, >50% in bed, but not bedbound (Capable of only limited self-care, confined to bed or chair 50% or more of waking hours)  4 - Bedbound (Completely disabled. Cannot carry on any self-care. Totally confined to bed or chair)  5 - Death   Eustace Pen MM, Creech RH, Tormey DC, et al. [phone]). "Toxicity and response criteria of the [facility]  Group". [place] Oncol. 5 (6): 649-55    LABORATORY DATA:  Lab Results  Component Value Date   WBC 8.0 10/11/2018   HGB 12.6 10/11/2018   HCT 40.4 10/11/2018   MCV 92.4 10/11/2018   PLT 222 10/11/2018   Lab Results  Component Value Date   NA 143 10/11/2018   K 4.5 10/11/2018   CL 108 10/11/2018   CO2 26 10/11/2018   Lab Results  Component Value Date   ALT 25 10/11/2018   AST 22 10/11/2018   ALKPHOS 73 10/11/2018   BILITOT 0.6 10/11/2018      RADIOGRAPHY: Nm Sentinel Node Inj-no Rpt (breast)  Result Date: 11/07/2018 Sulfur colloid was injected by the nuclear medicine technologist for melanoma sentinel node.       IMPRESSION/PLAN: 1. 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 Dr. Lisbeth Renshaw discusses the final pathology findings and reviews the nature of invasive breast disease. She is ready to proceed with external radiotherapy to the breast followed by antiestrogen therapy. We discussed the risks, benefits, short, and long term effects of radiotherapy, and the patient is interested in proceeding. Dr. Lisbeth Renshaw discusses the delivery and logistics of radiotherapy and recommends a course of 4 weeks of radiotherapy. She will simulate today. Written consent is obtained and placed in the chart, a copy was provided to the patient.  In a visit lasting 30 minutes, greater than 50% of the time was spent face to face discussing her case, and coordinating the patient's care.   The above documentation reflects my direct findings during this shared patient visit. Please see the separate note by Dr. Lisbeth Renshaw on this date for the remainder of the patient's plan of care.    Carola Rhine, PAC

## 2018-12-07 NOTE — Progress Notes (Signed)
  Radiation Oncology         [phone]) [phone] ________________________________  Name: Miranda Frese Bartoszek MRN: [id number]  Date: 12/07/2018  DOB: 10/11/1946  Optical Surface Tracking Plan:  Since intensity modulated radiotherapy (IMRT) and 3D conformal radiation treatment methods are predicated on accurate and precise positioning for treatment, intrafraction motion monitoring is medically necessary to ensure accurate and safe treatment delivery.  The ability to quantify intrafraction motion without excessive ionizing radiation dose can only be performed with optical surface tracking. Accordingly, surface imaging offers the opportunity to obtain 3D measurements of patient position throughout IMRT and 3D treatments without excessive radiation exposure.  I am ordering optical surface tracking for this patient's upcoming course of radiotherapy. ________________________________  Kyung Rudd, MD 12/07/2018 11:34 AM    Reference:   Ursula Alert, J, et al. Surface imaging-based analysis of intrafraction motion for breast radiotherapy patients.Journal of [place], n. 6, nov. 2014. ISSN 62863817.   Available at: <http://www.jacmp.org/index.php/jacmp/article/view/4957>.

## 2018-12-08 DIAGNOSIS — Z510 Encounter for antineoplastic radiation therapy: Secondary | ICD-10-CM | POA: Diagnosis not present

## 2018-12-08 DIAGNOSIS — Z170 Estrogen receptor positive status [ER+]: Secondary | ICD-10-CM | POA: Diagnosis not present

## 2018-12-08 DIAGNOSIS — C50511 Malignant neoplasm of lower-outer quadrant of right female breast: Secondary | ICD-10-CM | POA: Diagnosis not present

## 2018-12-12 ENCOUNTER — Telehealth: Payer: Self-pay | Admitting: Oncology

## 2018-12-12 NOTE — Telephone Encounter (Signed)
Scheduled appt per 1/31 sch message - pt is aware of appt date and time

## 2018-12-14 ENCOUNTER — Ambulatory Visit
Admission: RE | Admit: 2018-12-14 | Discharge: 2018-12-14 | Disposition: A | Discharge status: Home / Self Care | Payer: Medicare Other | Source: Ambulatory Visit | Attending: Radiation Oncology | Admitting: Radiation Oncology

## 2018-12-14 DIAGNOSIS — Z170 Estrogen receptor positive status [ER+]: Secondary | ICD-10-CM | POA: Diagnosis not present

## 2018-12-14 DIAGNOSIS — C50511 Malignant neoplasm of lower-outer quadrant of right female breast: Secondary | ICD-10-CM | POA: Insufficient documentation

## 2018-12-14 DIAGNOSIS — Z510 Encounter for antineoplastic radiation therapy: Secondary | ICD-10-CM | POA: Diagnosis not present

## 2018-12-15 ENCOUNTER — Ambulatory Visit
Admission: RE | Admit: 2018-12-15 | Discharge: 2018-12-15 | Disposition: A | Discharge status: Home / Self Care | Payer: Medicare Other | Source: Ambulatory Visit | Attending: Radiation Oncology | Admitting: Radiation Oncology

## 2018-12-15 DIAGNOSIS — Z170 Estrogen receptor positive status [ER+]: Secondary | ICD-10-CM | POA: Diagnosis not present

## 2018-12-15 DIAGNOSIS — C50511 Malignant neoplasm of lower-outer quadrant of right female breast: Secondary | ICD-10-CM | POA: Diagnosis not present

## 2018-12-15 DIAGNOSIS — Z510 Encounter for antineoplastic radiation therapy: Secondary | ICD-10-CM | POA: Diagnosis not present

## 2018-12-18 ENCOUNTER — Ambulatory Visit
Admission: RE | Admit: 2018-12-18 | Discharge: 2018-12-18 | Disposition: A | Discharge status: Home / Self Care | Payer: Medicare Other | Source: Ambulatory Visit | Attending: Radiation Oncology | Admitting: Radiation Oncology

## 2018-12-18 DIAGNOSIS — Z170 Estrogen receptor positive status [ER+]: Principal | ICD-10-CM

## 2018-12-18 DIAGNOSIS — C50511 Malignant neoplasm of lower-outer quadrant of right female breast: Secondary | ICD-10-CM

## 2018-12-18 DIAGNOSIS — Z510 Encounter for antineoplastic radiation therapy: Secondary | ICD-10-CM | POA: Diagnosis not present

## 2018-12-18 MED ORDER — RADIAPLEXRX EX GEL
Freq: Once | CUTANEOUS | Status: AC
  Administered 2018-12-18: 16:00:00 via TOPICAL

## 2018-12-18 MED ORDER — ALRA NON-METALLIC DEODORANT (RAD-ONC)
1.0000 "application " | Freq: Once | TOPICAL | Status: AC
  Administered 2018-12-18: 1 via TOPICAL

## 2018-12-18 NOTE — Progress Notes (Signed)
Pt here for patient teaching.  Pt given Radiation and You booklet, skin care instructions, Alra deodorant and Radiaplex gel.  Reviewed areas of pertinence such as fatigue, hair loss, skin changes, breast tenderness and breast swelling . Pt able to give teach back of to pat skin and use unscented/gentle soap,apply Radiaplex bid, avoid applying anything to skin within 4 hours of treatment, avoid wearing an under wire bra and to use an electric razor if they must shave. Pt demonstrated understanding, of information given and will contact nursing with any questions or concerns.     Http://rtanswers.org/treatmentinformation/whattoexpect/index      

## 2018-12-19 ENCOUNTER — Ambulatory Visit
Admission: RE | Admit: 2018-12-19 | Discharge: 2018-12-19 | Disposition: A | Discharge status: Home / Self Care | Payer: Medicare Other | Source: Ambulatory Visit | Attending: Radiation Oncology | Admitting: Radiation Oncology

## 2018-12-19 DIAGNOSIS — Z510 Encounter for antineoplastic radiation therapy: Secondary | ICD-10-CM | POA: Diagnosis not present

## 2018-12-19 DIAGNOSIS — C50511 Malignant neoplasm of lower-outer quadrant of right female breast: Secondary | ICD-10-CM | POA: Diagnosis not present

## 2018-12-19 DIAGNOSIS — Z170 Estrogen receptor positive status [ER+]: Secondary | ICD-10-CM | POA: Diagnosis not present

## 2018-12-20 ENCOUNTER — Ambulatory Visit: Payer: Medicare Other

## 2018-12-21 ENCOUNTER — Ambulatory Visit: Payer: Medicare Other | Admitting: Oncology

## 2018-12-21 ENCOUNTER — Ambulatory Visit
Admission: RE | Admit: 2018-12-21 | Discharge: 2018-12-21 | Disposition: A | Discharge status: Home / Self Care | Payer: Medicare Other | Source: Ambulatory Visit | Attending: Radiation Oncology | Admitting: Radiation Oncology

## 2018-12-21 DIAGNOSIS — Z170 Estrogen receptor positive status [ER+]: Secondary | ICD-10-CM | POA: Diagnosis not present

## 2018-12-21 DIAGNOSIS — Z510 Encounter for antineoplastic radiation therapy: Secondary | ICD-10-CM | POA: Diagnosis not present

## 2018-12-21 DIAGNOSIS — C50511 Malignant neoplasm of lower-outer quadrant of right female breast: Secondary | ICD-10-CM | POA: Diagnosis not present

## 2018-12-22 ENCOUNTER — Ambulatory Visit
Admission: RE | Admit: 2018-12-22 | Discharge: 2018-12-22 | Disposition: A | Discharge status: Home / Self Care | Payer: Medicare Other | Source: Ambulatory Visit | Attending: Radiation Oncology | Admitting: Radiation Oncology

## 2018-12-22 DIAGNOSIS — C50511 Malignant neoplasm of lower-outer quadrant of right female breast: Secondary | ICD-10-CM | POA: Diagnosis not present

## 2018-12-22 DIAGNOSIS — Z170 Estrogen receptor positive status [ER+]: Secondary | ICD-10-CM | POA: Diagnosis not present

## 2018-12-22 DIAGNOSIS — Z510 Encounter for antineoplastic radiation therapy: Secondary | ICD-10-CM | POA: Diagnosis not present

## 2018-12-25 ENCOUNTER — Other Ambulatory Visit: Payer: Medicare Other

## 2018-12-25 ENCOUNTER — Ambulatory Visit
Admission: RE | Admit: 2018-12-25 | Discharge: 2018-12-25 | Disposition: A | Discharge status: Home / Self Care | Payer: Medicare Other | Source: Ambulatory Visit | Attending: Radiation Oncology | Admitting: Radiation Oncology

## 2018-12-25 DIAGNOSIS — C50511 Malignant neoplasm of lower-outer quadrant of right female breast: Secondary | ICD-10-CM | POA: Diagnosis not present

## 2018-12-25 DIAGNOSIS — Z510 Encounter for antineoplastic radiation therapy: Secondary | ICD-10-CM | POA: Diagnosis not present

## 2018-12-25 DIAGNOSIS — Z170 Estrogen receptor positive status [ER+]: Secondary | ICD-10-CM | POA: Diagnosis not present

## 2018-12-26 ENCOUNTER — Ambulatory Visit
Admission: RE | Admit: 2018-12-26 | Discharge: 2018-12-26 | Disposition: A | Discharge status: Home / Self Care | Payer: Medicare Other | Source: Ambulatory Visit | Attending: Radiation Oncology | Admitting: Radiation Oncology

## 2018-12-26 DIAGNOSIS — Z170 Estrogen receptor positive status [ER+]: Secondary | ICD-10-CM | POA: Diagnosis not present

## 2018-12-26 DIAGNOSIS — C50511 Malignant neoplasm of lower-outer quadrant of right female breast: Secondary | ICD-10-CM | POA: Diagnosis not present

## 2018-12-26 DIAGNOSIS — Z510 Encounter for antineoplastic radiation therapy: Secondary | ICD-10-CM | POA: Diagnosis not present

## 2018-12-27 ENCOUNTER — Ambulatory Visit
Admission: RE | Admit: 2018-12-27 | Discharge: 2018-12-27 | Disposition: A | Discharge status: Home / Self Care | Payer: Medicare Other | Source: Ambulatory Visit | Attending: Radiation Oncology | Admitting: Radiation Oncology

## 2018-12-27 DIAGNOSIS — Z170 Estrogen receptor positive status [ER+]: Secondary | ICD-10-CM | POA: Diagnosis not present

## 2018-12-27 DIAGNOSIS — C50511 Malignant neoplasm of lower-outer quadrant of right female breast: Secondary | ICD-10-CM | POA: Diagnosis not present

## 2018-12-27 DIAGNOSIS — Z510 Encounter for antineoplastic radiation therapy: Secondary | ICD-10-CM | POA: Diagnosis not present

## 2018-12-28 ENCOUNTER — Ambulatory Visit
Admission: RE | Admit: 2018-12-28 | Discharge: 2018-12-28 | Disposition: A | Discharge status: Home / Self Care | Payer: Medicare Other | Source: Ambulatory Visit | Attending: Radiation Oncology | Admitting: Radiation Oncology

## 2018-12-28 ENCOUNTER — Encounter: Payer: Self-pay | Admitting: Family Medicine

## 2018-12-28 ENCOUNTER — Ambulatory Visit (INDEPENDENT_AMBULATORY_CARE_PROVIDER_SITE_OTHER): Payer: Medicare Other | Admitting: Family Medicine

## 2018-12-28 DIAGNOSIS — E079 Disorder of thyroid, unspecified: Secondary | ICD-10-CM

## 2018-12-28 DIAGNOSIS — I10 Essential (primary) hypertension: Secondary | ICD-10-CM

## 2018-12-28 DIAGNOSIS — E782 Mixed hyperlipidemia: Secondary | ICD-10-CM | POA: Diagnosis not present

## 2018-12-28 DIAGNOSIS — Z170 Estrogen receptor positive status [ER+]: Secondary | ICD-10-CM | POA: Diagnosis not present

## 2018-12-28 DIAGNOSIS — E559 Vitamin D deficiency, unspecified: Secondary | ICD-10-CM | POA: Diagnosis not present

## 2018-12-28 DIAGNOSIS — R739 Hyperglycemia, unspecified: Secondary | ICD-10-CM

## 2018-12-28 DIAGNOSIS — C50511 Malignant neoplasm of lower-outer quadrant of right female breast: Secondary | ICD-10-CM

## 2018-12-28 DIAGNOSIS — Z510 Encounter for antineoplastic radiation therapy: Secondary | ICD-10-CM | POA: Diagnosis not present

## 2018-12-28 LAB — COMPREHENSIVE METABOLIC PANEL
ALT: 14 U/L (ref 0–35)
AST: 14 U/L (ref 0–37)
Albumin: 4.4 g/dL (ref 3.5–5.2)
Alkaline Phosphatase: 62 U/L (ref 39–117)
BUN: 12 mg/dL (ref 6–23)
CO2: 24 mEq/L (ref 19–32)
Calcium: 9.2 mg/dL (ref 8.4–10.5)
Chloride: 109 mEq/L (ref 96–112)
Creatinine, Ser: 0.65 mg/dL (ref 0.40–1.20)
GFR: 89.83 mL/min (ref >60.00)
Glucose, Bld: 110 mg/dL — ABNORMAL HIGH (ref 70–99)
Potassium: 4.2 mEq/L (ref 3.5–5.1)
Sodium: 142 mEq/L (ref 135–145)
Total Bilirubin: 0.5 mg/dL (ref 0.2–1.2)
Total Protein: 6.4 g/dL (ref 6.0–8.3)

## 2018-12-28 LAB — CBC
HCT: 38.3 % (ref 36.0–46.0)
Hemoglobin: 12.7 g/dL (ref 12.0–15.0)
MCHC: 33.1 g/dL (ref 30.0–36.0)
MCV: 89.2 fl (ref 78.0–100.0)
Platelets: 205 10*3/uL (ref 150.0–400.0)
RBC: 4.3 Mil/uL (ref 3.87–5.11)
RDW: 14.3 % (ref 11.5–15.5)
WBC: 7.2 10*3/uL (ref 4.0–10.5)

## 2018-12-28 LAB — LIPID PANEL
CHOLESTEROL: 109 mg/dL (ref 0–200)
HDL: 40.9 mg/dL (ref >39.00)
LDL CALC: 44 mg/dL (ref 0–99)
NonHDL: 67.66
Total CHOL/HDL Ratio: 3
Triglycerides: 118 mg/dL (ref 0.0–149.0)
VLDL: 23.6 mg/dL (ref 0.0–40.0)

## 2018-12-28 LAB — TSH: TSH: 1.98 u[IU]/mL (ref 0.35–4.50)

## 2018-12-28 LAB — HEMOGLOBIN A1C: Hgb A1c MFr Bld: 5.8 % (ref 4.6–6.5)

## 2018-12-28 MED ORDER — AMLODIPINE BESYLATE 5 MG PO TABS: ORAL_TABLET | ORAL | 1 refills | Status: DC

## 2018-12-28 MED ORDER — OMEPRAZOLE 20 MG PO CPDR: 20.0000 mg | DELAYED_RELEASE_CAPSULE | Freq: Every day | ORAL | 3 refills | Status: DC

## 2018-12-28 MED ORDER — METOPROLOL TARTRATE 25 MG PO TABS: ORAL_TABLET | ORAL | 1 refills | Status: DC

## 2018-12-28 MED ORDER — ROSUVASTATIN CALCIUM 5 MG PO TABS: ORAL_TABLET | ORAL | 1 refills | Status: DC

## 2018-12-28 MED ORDER — SYNTHROID 75 MCG PO TABS: ORAL_TABLET | ORAL | 3 refills | Status: DC

## 2018-12-28 NOTE — Assessment & Plan Note (Signed)
Is tolerating her radiation well and is pleased she will not require chemo. She is advised to stop Krill oil and all antioxidants for at least the duration of her treatments. Consider a MVI twice a week.

## 2018-12-28 NOTE — Assessment & Plan Note (Signed)
hgba1c acceptable, minimize simple carbs. Increase exercise as tolerated.  

## 2018-12-28 NOTE — Progress Notes (Signed)
Subjective:    Patient ID: Tammy Grimes, female    DOB: April 24, 1946, 73 y.o.   MRN: [id number]  No chief complaint on file.   HPI Patient is in today for follow up.  She is feeling well today.  She is tolerating her radiation treatments for her breast cancer and other than a bit of fatigue and some pain in her right axilla where the lymph nodes were removed she is doing well.  She is pleased she will not need to undergo chemotherapy.  She is managed to stay active in the gym and is trying to maintain healthy diet.  She hydrates well and offers no acute or new concerns otherwise. Denies CP/palp/SOB/HA/congestion/fevers/GI or GU c/o. Taking meds as prescribed  Past Medical History:  Diagnosis Date  . Allergy   . Anemia    prior to hysterectomy  . BCC (basal cell carcinoma) 08/01/2015  . Breast cancer ([place]) 09/25/2018   right  . Cancer ([place]) 2007   calf of right leg- melanoma  . Cataract   . Cervical cancer screening 06/06/2012  . DDD (degenerative disc disease), cervical 08/01/2015  . Family history of bladder cancer   . Family history of breast cancer   . Family history of colon cancer   . Family history of ovarian cancer   . Family history of prostate cancer   . GERD (gastroesophageal reflux disease)   . Hyperglycemia 03/28/2017  . Hyperlipidemia   . Hypertension 65  . Hypothyroidism   . Knee pain 03/30/2012   L>R  . Low back pain 03/23/2018  . Medicare annual wellness visit, subsequent 02/09/2015  . Melanoma of skin ([place]) 08/19/2016  . PONV (postoperative nausea and vomiting)   . Preventative health care 03/30/2012  . Thyroid disease 64  . Vitamin D deficiency 06/06/2012    Past Surgical History:  Procedure Laterality Date  . ABDOMINAL HYSTERECTOMY  1990's   total, for heavy bleeding and fibroids  . BASAL CELL CARCINOMA EXCISION    . BREAST LUMPECTOMY WITH RADIOACTIVE SEED AND SENTINEL LYMPH NODE BIOPSY Right 11/07/2018   Procedure: RIGHT BREAST LUMPECTOMY WITH  RADIOACTIVE SEED AND SENTINEL LYMPH NODE BIOPSY;  Surgeon: [name], MD;  Location: [facility];  Service: General;  Laterality: Right;  . BREAST SURGERY  early 70's   fibroid tumors removed- benign  . CATARACT EXTRACTION Bilateral 12/09/12  . COLONOSCOPY    . EYE SURGERY Bilateral 2015   cataracts  . MELANOMA EXCISION  2007    Family History  Problem Relation Age of Onset  . Hypertension Mother   . Alzheimer's disease Mother   . Dementia Mother        alzheimer's  . Aortic aneurysm Father   . Hypertension Father   . COPD Father        smoker  . Heart disease Father        s/p bypass, aortic aneurysm, carotid artery disease  . Breast cancer Sister 54  . Diabetes Son 22       type 1  . Alzheimer's disease Maternal Grandmother   . Prostate cancer Maternal Grandfather 60       metastatic  . Stroke Paternal Grandfather   . Ovarian cancer Maternal Aunt 75  . Cancer Maternal Aunt        cervical or uterine  . Colon cancer Paternal Uncle 10  . Breast cancer Cousin 68  . Breast cancer Cousin 70  . Colon cancer Cousin  dx >50  . Esophageal cancer Neg Hx   . Pancreatic cancer Neg Hx   . Rectal cancer Neg Hx   . Stomach cancer Neg Hx     Social History   Socioeconomic History  . Marital status: Married    Spouse name: Not on file  . Number of children: Not on file  . Years of education: Not on file  . Highest education level: Not on file  Occupational History  . Not on file  Social Needs  . Financial resource strain: Not on file  . Food insecurity:    Worry: Not on file    Inability: Not on file  . Transportation needs:    Medical: No    Non-medical: No  Tobacco Use  . Smoking status: Never Smoker  . Smokeless tobacco: Never Used  Substance and Sexual Activity  . Alcohol use: No  . Drug use: No  . Sexual activity: Yes    Comment: lives with husband, retired from [employer] work, no dietary restrictions  Lifestyle  . Physical  activity:    Days per week: Not on file    Minutes per session: Not on file  . Stress: Not on file  Relationships  . Social connections:    Talks on phone: Not on file    Gets together: Not on file    Attends religious service: Not on file    Active member of club or organization: Not on file    Attends meetings of clubs or organizations: Not on file    Relationship status: Not on file  . Intimate partner violence:    Fear of current or ex partner: No    Emotionally abused: No    Physically abused: No    Forced sexual activity: No  Other Topics Concern  . Not on file  Social History Narrative  . Not on file    Outpatient Medications Prior to Visit  Medication Sig Dispense Refill  . aspirin 81 MG tablet Take 81 mg by mouth daily.    . calcium-vitamin D 250-100 MG-UNIT per tablet Take 1 tablet by mouth daily.    Marland Kitchen KRILL OIL PO Take 1 capsule by mouth daily.    . Turmeric 500 MG TABS Take 500 mg by mouth daily.    Marland Kitchen amLODipine (NORVASC) 5 MG tablet TAKE 1 TABLET(5 MG) BY MOUTH DAILY 90 tablet 1  . metoprolol tartrate (LOPRESSOR) 25 MG tablet Take 1/2 tablet (12.5mg  ) by mouth two times a day 90 tablet 1  . omeprazole (PRILOSEC) 20 MG capsule Take 1 capsule (20 mg total) by mouth daily. 90 capsule 3  . ranitidine (ZANTAC) 300 MG tablet Take 1 tablet (300 mg total) by mouth at bedtime. 90 tablet 1  . rosuvastatin (CRESTOR) 5 MG tablet TAKE 1 TABLET(5 MG) BY MOUTH DAILY. Please make yearly appt with Dr. Saunders Revel for July before anymore refills. 1st attempt 90 tablet 1  . SYNTHROID 75 MCG tablet TAKE 1 TABLET(75 MCG) BY MOUTH DAILY BEFORE BREAKFAST 90 tablet 0  . oxyCODONE (OXY IR/ROXICODONE) 5 MG immediate release tablet Take 1 tablet (5 mg total) by mouth every 6 (six) hours as needed for severe pain. (Patient not taking: Reported on 11/30/2018) 15 tablet 0   No facility-administered medications prior to visit.     Allergies  Allergen Reactions  . Penicillins Rash    Review of  Systems  Constitutional: Positive for malaise/fatigue. Negative for fever.  HENT: Negative for congestion.   Eyes:  Negative for blurred vision.  Respiratory: Negative for shortness of breath.   Cardiovascular: Negative for chest pain, palpitations and leg swelling.  Gastrointestinal: Negative for abdominal pain, blood in stool and nausea.  Genitourinary: Negative for dysuria and frequency.  Musculoskeletal: Positive for joint pain. Negative for falls.  Skin: Negative for rash.  Neurological: Negative for dizziness, loss of consciousness and headaches.  Endo/Heme/Allergies: Negative for environmental allergies.  Psychiatric/Behavioral: Negative for depression. The patient is not nervous/anxious.        Objective:    Physical Exam Vitals signs and nursing note reviewed.  Constitutional:      General: She is not in acute distress.    Appearance: She is well-developed.  HENT:     Head: Normocephalic and atraumatic.     Nose: Nose normal.  Eyes:     General:        Right eye: No discharge.        Left eye: No discharge.  Neck:     Musculoskeletal: Normal range of motion and neck supple.  Cardiovascular:     Rate and Rhythm: Normal rate and regular rhythm.     Heart sounds: No murmur.  Pulmonary:     Effort: Pulmonary effort is normal.     Breath sounds: Normal breath sounds.  Abdominal:     General: Bowel sounds are normal.     Palpations: Abdomen is soft.     Tenderness: There is no abdominal tenderness.  Skin:    General: Skin is warm and dry.  Neurological:     Mental Status: She is alert and oriented to person, place, and time.     BP 118/72 (BP Location: Left Arm, Patient Position: Sitting, Cuff Size: Normal)   Pulse 73   Temp 97.7 F (36.5 C) (Oral)   Resp 18   Ht 5\' 2"  (1.575 m)   Wt 140 lb 9.6 oz (63.8 kg)   SpO2 98%   BMI 25.72 kg/m  Wt Readings from Last 3 Encounters:  12/28/18 140 lb 9.6 oz (63.8 kg)  12/07/18 137 lb 9.6 oz (62.4 kg)  11/07/18 139  lb 15.9 oz (63.5 kg)     Lab Results  Component Value Date   WBC 8.0 10/11/2018   HGB 12.6 10/11/2018   HCT 40.4 10/11/2018   PLT 222 10/11/2018   GLUCOSE 106 (H) 10/11/2018   CHOL 124 06/22/2018   TRIG 179.0 (H) 06/22/2018   HDL 40.80 06/22/2018   LDLDIRECT 152.0 08/01/2015   LDLCALC 48 06/22/2018   ALT 25 10/11/2018   AST 22 10/11/2018   NA 143 10/11/2018   K 4.5 10/11/2018   CL 108 10/11/2018   CREATININE 0.76 10/11/2018   BUN 11 10/11/2018   CO2 26 10/11/2018   TSH 1.76 06/22/2018   HGBA1C 6.1 06/22/2018    Lab Results  Component Value Date   TSH 1.76 06/22/2018   Lab Results  Component Value Date   WBC 8.0 10/11/2018   HGB 12.6 10/11/2018   HCT 40.4 10/11/2018   MCV 92.4 10/11/2018   PLT 222 10/11/2018   Lab Results  Component Value Date   NA 143 10/11/2018   K 4.5 10/11/2018   CO2 26 10/11/2018   GLUCOSE 106 (H) 10/11/2018   BUN 11 10/11/2018   CREATININE 0.76 10/11/2018   BILITOT 0.6 10/11/2018   ALKPHOS 73 10/11/2018   AST 22 10/11/2018   ALT 25 10/11/2018   PROT 7.3 10/11/2018   ALBUMIN 4.2 10/11/2018   CALCIUM  9.7 10/11/2018   ANIONGAP 9 10/11/2018   GFR 83.63 06/22/2018   Lab Results  Component Value Date   CHOL 124 06/22/2018   Lab Results  Component Value Date   HDL 40.80 06/22/2018   Lab Results  Component Value Date   LDLCALC 48 06/22/2018   Lab Results  Component Value Date   TRIG 179.0 (H) 06/22/2018   Lab Results  Component Value Date   CHOLHDL 3 06/22/2018   Lab Results  Component Value Date   HGBA1C 6.1 06/22/2018       Assessment & Plan:   Problem List Items Addressed This Visit    Thyroid disease    On Levothyroxine, continue to monitor      Relevant Medications   metoprolol tartrate (LOPRESSOR) 25 MG tablet   SYNTHROID 75 MCG tablet   Essential hypertension    Well controlled, no changes to meds. Encouraged heart healthy diet such as the DASH diet and exercise as tolerated.   Previously seen by Dr  Arlan Organ End of [facility] but he has moved his practice to [facility] so when she is ready to return we will consider Dr Burt Knack or Dr Stanford Breed.       Relevant Medications   metoprolol tartrate (LOPRESSOR) 25 MG tablet   amLODipine (NORVASC) 5 MG tablet   rosuvastatin (CRESTOR) 5 MG tablet   Other Relevant Orders   CBC   Comprehensive metabolic panel   TSH   Hyperlipidemia    Encouraged heart healthy diet, increase exercise, avoid trans fats, consider a krill oil cap daily      Relevant Medications   metoprolol tartrate (LOPRESSOR) 25 MG tablet   amLODipine (NORVASC) 5 MG tablet   rosuvastatin (CRESTOR) 5 MG tablet   Other Relevant Orders   Lipid panel   Vitamin D deficiency    Supplement and monitor       Hyperglycemia    hgba1c acceptable, minimize simple carbs. Increase exercise as tolerated.       Relevant Orders   Hemoglobin A1c   Malignant neoplasm of lower-outer quadrant of right breast of female, estrogen receptor positive ([place])    Is tolerating her radiation well and is pleased she will not require chemo. She is advised to stop Krill oil and all antioxidants for at least the duration of her treatments. Consider a MVI twice a week.          I have discontinued Tammy Perking. [place]ed[place] "[name]"'s ranitidine and oxyCODONE. I am also having her maintain her aspirin, KRILL OIL PO, calcium-vitamin D, Turmeric, metoprolol tartrate, amLODipine, rosuvastatin, omeprazole, and SYNTHROID.  Meds ordered this encounter  Medications  . metoprolol tartrate (LOPRESSOR) 25 MG tablet    Sig: Take 1/2 tablet (12.5mg  ) by mouth two times a day    Dispense:  90 tablet    Refill:  1  . amLODipine (NORVASC) 5 MG tablet    Sig: TAKE 1 TABLET(5 MG) BY MOUTH DAILY    Dispense:  90 tablet    Refill:  1  . rosuvastatin (CRESTOR) 5 MG tablet    Sig: TAKE 1 TABLET(5 MG) BY MOUTH DAILY. Please make yearly appt with Dr. Saunders Revel for July before anymore refills. 1st attempt    Dispense:  90 tablet     Refill:  1  . omeprazole (PRILOSEC) 20 MG capsule    Sig: Take 1 capsule (20 mg total) by mouth daily.    Dispense:  90 capsule    Refill:  3  .  SYNTHROID 75 MCG tablet    Sig: TAKE 1 TABLET(75 MCG) BY MOUTH DAILY BEFORE BREAKFAST    Dispense:  90 tablet    Refill:  3     Penni Homans, MD

## 2018-12-28 NOTE — Assessment & Plan Note (Signed)
Encouraged heart healthy diet, increase exercise, avoid trans fats, consider a krill oil cap daily 

## 2018-12-28 NOTE — Assessment & Plan Note (Signed)
On Levothyroxine, continue to monitor 

## 2018-12-28 NOTE — Patient Instructions (Signed)
Breast Cancer, Female  Breast cancer is a malignant growth of tissue (tumor) in the breast. Unlike noncancerous (benign) tumors, malignant tumors are cancerous and can spread to other parts of the body. The two most common types of breast cancer start in the milk ducts (ductal carcinoma) or in the lobules where milk is made in the breast (lobular carcinoma). Breast cancer is one of the most common types of cancer in women. What are the causes? The exact cause of female breast cancer is unknown. What increases the risk? The following factors may make you more likely to develop this condition:  Being older than 71 years of age.  Race and ethnicity. Caucasian women generally have an increased risk, but African-American women are more likely to develop the disease before age 18.  Having a family history of breast cancer.  Having had breast cancer in the past.  Having certain noncancerous conditions of the breast, such as dense breast tissue.  Having the BRCA1 and BRCA2 genes.  Having a history of radiation exposure.  Obesity.  Starting menopause after age 64.  Starting your menstrual periods before age 10.  Having never been pregnant or having your first child after age 53.  Having never breastfed.  Using hormone therapy after menopause.  Using birth control pills.  Drinking more than one alcoholic drink a day.  Exposure to the drug DES, which was given to pregnant women from the 1940s to the 1970s. What are the signs or symptoms? Symptoms of this condition include:  A painless lump or thickening in your breast.  Changes in the size or shape of your breast.  Breast skin changes, such as puckering or dimpling.  Nipple abnormalities, such as scaling, crustiness, redness, or pulling in (retraction).  Nipple discharge that is bloody or clear. How is this diagnosed? This condition may be diagnosed by:  Taking your medical history and doing a physical exam. During the  exam, your health care provider will feel the tissue around your breast and under your arms.  Taking a sample of nipple discharge. The sample will be examined under a microscope.  Performing imaging tests, such as breast X-rays (mammogram), breast ultrasound exams, or an MRI.  Taking a tissue sample (biopsy) from the breast. The sample will be examined under a microscope to look for cancer cells.  Taking a sample from the lymph nodes near the affected breast (sentinel node biopsy). Your cancer will be staged to determine its severity and extent. Staging is a careful attempt to find out the size of the tumor, whether the cancer has spread, and if so, to what parts of the body. Staging also includes testing your tumor for certain receptors, such as estrogen, progesterone, and human epidermal growth factor receptor 2 (HER2). This will help your cancer care team decide on a treatment that will work best for you. You may need to have more tests to determine the stage of your cancer. Stages include the following:  Stage 0-The tumor has not spread to other breast tissue.  Stage I-The cancer is only found in the breast or may be in the lymph nodes. The tumor may be up to  in (2 cm) wide.  Stage II-The cancer has spread to nearby lymph nodes. The tumor may be up to 2 in (5 cm) wide.  Stage III-The cancer has spread to more distant lymph nodes. The tumor may be larger than 2 in (5 cm) wide.  Stage IV-The cancer has spread to other parts of  the body, such as the bones, brain, liver, or lungs. How is this treated? Treatment for this condition depends on the type and stage of the breast cancer. It may be treated with:  Surgery. This may involve breast-conserving surgery (lumpectomy or partial mastectomy) in which only the part of the breast containing the cancer is removed. Some normal tissue surrounding this area may also be removed. In some cases, surgery may be done to remove the entire breast  (mastectomy) and nipple. Lymph nodes may also be removed.  Radiation therapy, which uses high-energy rays to kill cancer cells.  Chemotherapy, which is the use of drugs to kill cancer cells.  Hormone therapy, which involves taking medicine to adjust the hormone levels in your body. You may take medicine to decrease your estrogen levels. This can help stop cancer cells from growing.  Targeted therapy, in which drugs are used to block the growth and spread of cancer cells. These drugs target a specific part of the cancer cell and usually cause fewer side effects than chemotherapy. Targeted therapy may be used alone or in combination with chemotherapy.  A combination of surgery, radiation, chemotherapy, or hormone therapy may be needed to treat breast cancer. Follow these instructions at home:  Take over-the-counter and prescription medicines only as told by your health care provider.  Eat a healthy diet. A healthy diet includes lots of fruits and vegetables, low-fat dairy products, lean meats, and fiber. ? Make sure half your plate is filled with fruits or vegetables. ? Choose high-fiber foods such as whole-grain breads and cereals.  Consider joining a support group. This may help you learn to cope with the stress of having breast cancer.  Talk to your health care team about exercise and physical activity. The right exercise program can: ? Help prevent or reduce symptoms such as fatigue or depression. ? Improve overall health and survival rates.  Keep all follow-up visits as told by your health care provider. This is important. Where to find more information  American Cancer Society: www.cancer.org  National Cancer Institute: www.cancer.gov Contact a health care provider if:  You have a sudden increase in pain.  You have any symptoms or changes that concern you.  You lose weight without trying.  You notice a new lump in either breast or under your arm.  You develop swelling in  either arm or hand.  You have a fever.  You notice new fatigue or weakness. Get help right away if:  You have chest pain or trouble breathing.  You faint. Summary  Breast cancer is a malignant growth of tissue (tumor) in the breast.  Your cancer will be staged to determine its severity and extent.  Treatment for this condition depends on the type and stage of the breast cancer. This information is not intended to replace advice given to you by your health care provider. Make sure you discuss any questions you have with your health care provider. Document Released: 02/02/2006 Document Revised: 06/20/2017 Document Reviewed: 06/20/2017 Elsevier Interactive Patient Education  2019 [employer] Inc[employer]  

## 2018-12-28 NOTE — Assessment & Plan Note (Addendum)
Well controlled, no changes to meds. Encouraged heart healthy diet such as the DASH diet and exercise as tolerated.   Previously seen by Dr Arlan Organ End of [facility] but he has moved his practice to [facility] so when she is ready to return we will consider Dr Burt Knack or Dr Stanford Breed.

## 2018-12-28 NOTE — Assessment & Plan Note (Signed)
Supplement and monitor 

## 2018-12-29 ENCOUNTER — Ambulatory Visit
Admission: RE | Admit: 2018-12-29 | Discharge: 2018-12-29 | Disposition: A | Discharge status: Home / Self Care | Payer: Medicare Other | Source: Ambulatory Visit | Attending: Radiation Oncology | Admitting: Radiation Oncology

## 2018-12-29 DIAGNOSIS — Z170 Estrogen receptor positive status [ER+]: Secondary | ICD-10-CM | POA: Diagnosis not present

## 2018-12-29 DIAGNOSIS — C50511 Malignant neoplasm of lower-outer quadrant of right female breast: Secondary | ICD-10-CM | POA: Diagnosis not present

## 2018-12-29 DIAGNOSIS — Z510 Encounter for antineoplastic radiation therapy: Secondary | ICD-10-CM | POA: Diagnosis not present

## 2019-01-01 ENCOUNTER — Ambulatory Visit
Admission: RE | Admit: 2019-01-01 | Discharge: 2019-01-01 | Disposition: A | Discharge status: Home / Self Care | Payer: Medicare Other | Source: Ambulatory Visit | Attending: Radiation Oncology | Admitting: Radiation Oncology

## 2019-01-01 DIAGNOSIS — Z170 Estrogen receptor positive status [ER+]: Secondary | ICD-10-CM | POA: Diagnosis not present

## 2019-01-01 DIAGNOSIS — Z510 Encounter for antineoplastic radiation therapy: Secondary | ICD-10-CM | POA: Diagnosis not present

## 2019-01-01 DIAGNOSIS — C50511 Malignant neoplasm of lower-outer quadrant of right female breast: Secondary | ICD-10-CM | POA: Diagnosis not present

## 2019-01-02 ENCOUNTER — Ambulatory Visit
Admission: RE | Admit: 2019-01-02 | Discharge: 2019-01-02 | Disposition: A | Discharge status: Home / Self Care | Payer: Medicare Other | Source: Ambulatory Visit | Attending: Radiation Oncology | Admitting: Radiation Oncology

## 2019-01-02 DIAGNOSIS — Z510 Encounter for antineoplastic radiation therapy: Secondary | ICD-10-CM | POA: Diagnosis not present

## 2019-01-02 DIAGNOSIS — C50511 Malignant neoplasm of lower-outer quadrant of right female breast: Secondary | ICD-10-CM | POA: Diagnosis not present

## 2019-01-02 DIAGNOSIS — Z170 Estrogen receptor positive status [ER+]: Secondary | ICD-10-CM | POA: Diagnosis not present

## 2019-01-03 ENCOUNTER — Ambulatory Visit
Admission: RE | Admit: 2019-01-03 | Discharge: 2019-01-03 | Disposition: A | Discharge status: Home / Self Care | Payer: Medicare Other | Source: Ambulatory Visit | Attending: Radiation Oncology | Admitting: Radiation Oncology

## 2019-01-03 DIAGNOSIS — Z510 Encounter for antineoplastic radiation therapy: Secondary | ICD-10-CM | POA: Diagnosis not present

## 2019-01-03 DIAGNOSIS — Z170 Estrogen receptor positive status [ER+]: Secondary | ICD-10-CM | POA: Diagnosis not present

## 2019-01-03 DIAGNOSIS — C50511 Malignant neoplasm of lower-outer quadrant of right female breast: Secondary | ICD-10-CM | POA: Diagnosis not present

## 2019-01-04 ENCOUNTER — Ambulatory Visit
Admission: RE | Admit: 2019-01-04 | Discharge: 2019-01-04 | Disposition: A | Discharge status: Home / Self Care | Payer: Medicare Other | Source: Ambulatory Visit | Attending: Radiation Oncology | Admitting: Radiation Oncology

## 2019-01-04 DIAGNOSIS — Z510 Encounter for antineoplastic radiation therapy: Secondary | ICD-10-CM | POA: Diagnosis not present

## 2019-01-04 DIAGNOSIS — C50511 Malignant neoplasm of lower-outer quadrant of right female breast: Secondary | ICD-10-CM | POA: Diagnosis not present

## 2019-01-04 DIAGNOSIS — Z170 Estrogen receptor positive status [ER+]: Secondary | ICD-10-CM | POA: Diagnosis not present

## 2019-01-05 ENCOUNTER — Ambulatory Visit
Admission: RE | Admit: 2019-01-05 | Discharge: 2019-01-05 | Disposition: A | Discharge status: Home / Self Care | Payer: Medicare Other | Source: Ambulatory Visit | Attending: Radiation Oncology | Admitting: Radiation Oncology

## 2019-01-05 ENCOUNTER — Ambulatory Visit: Payer: Medicare Other

## 2019-01-05 DIAGNOSIS — Z170 Estrogen receptor positive status [ER+]: Secondary | ICD-10-CM | POA: Diagnosis not present

## 2019-01-05 DIAGNOSIS — C50511 Malignant neoplasm of lower-outer quadrant of right female breast: Secondary | ICD-10-CM

## 2019-01-05 DIAGNOSIS — Z510 Encounter for antineoplastic radiation therapy: Secondary | ICD-10-CM | POA: Diagnosis not present

## 2019-01-05 MED ORDER — RADIAPLEXRX EX GEL
Freq: Once | CUTANEOUS | Status: AC
  Administered 2019-01-05: 17:00:00 via TOPICAL

## 2019-01-08 ENCOUNTER — Ambulatory Visit: Payer: Medicare Other

## 2019-01-08 ENCOUNTER — Ambulatory Visit
Admission: RE | Admit: 2019-01-08 | Discharge: 2019-01-08 | Disposition: A | Discharge status: Home / Self Care | Payer: Medicare Other | Source: Ambulatory Visit | Attending: Radiation Oncology | Admitting: Radiation Oncology

## 2019-01-08 DIAGNOSIS — Z510 Encounter for antineoplastic radiation therapy: Secondary | ICD-10-CM | POA: Diagnosis not present

## 2019-01-08 DIAGNOSIS — Z170 Estrogen receptor positive status [ER+]: Secondary | ICD-10-CM | POA: Diagnosis not present

## 2019-01-08 DIAGNOSIS — C50511 Malignant neoplasm of lower-outer quadrant of right female breast: Secondary | ICD-10-CM | POA: Insufficient documentation

## 2019-01-09 ENCOUNTER — Ambulatory Visit
Admission: RE | Admit: 2019-01-09 | Discharge: 2019-01-09 | Disposition: A | Discharge status: Home / Self Care | Payer: Medicare Other | Source: Ambulatory Visit | Attending: Radiation Oncology | Admitting: Radiation Oncology

## 2019-01-09 DIAGNOSIS — Z510 Encounter for antineoplastic radiation therapy: Secondary | ICD-10-CM | POA: Diagnosis not present

## 2019-01-09 DIAGNOSIS — C50511 Malignant neoplasm of lower-outer quadrant of right female breast: Secondary | ICD-10-CM | POA: Diagnosis not present

## 2019-01-09 DIAGNOSIS — Z170 Estrogen receptor positive status [ER+]: Secondary | ICD-10-CM | POA: Diagnosis not present

## 2019-01-09 NOTE — Progress Notes (Signed)
[place]  [phone]) [phone] [phone]) [phone]    ID: Tammy Grimes DOB: Apr 10, 1946  MR#: [id number]  KNL#:[id number]  Patient Care Team: Tammy Lukes, Grimes as PCP - General (Family Medicine) Martinique, Amy, Grimes as Consulting Physician (Dermatology) Tammy Guys, Grimes as Consulting Physician (Ophthalmology) Tammy Klein, Grimes as Consulting Physician (General Surgery) Tammy Grimes as Consulting Physician (Oncology) Tammy Rudd, Grimes as Consulting Physician (Radiation Oncology) Tammy Mayer, Grimes as Consulting Physician (Gastroenterology) OTHER Grimes: Tammy Grimes, Tammy Grimes DDS PA   CHIEF COMPLAINT: Estrogen receptor positive breast cancer  CURRENT TREATMENT: To start tamoxifen   HISTORY OF CURRENT ILLNESS: From the original intake note:  "Tammy Grimes" had routine screening mammography on 09/11/2018 showing a possible abnormality in the right breast. She underwent right unilateral diagnostic mammography with tomography and right breast ultrasonography at [facility] on 09/21/2018 showing: Breast Density Category C. 1.3 cm irregular focal asymmetry in the right breast lower inner aspect anterior depth 4 cm from the nipple noted on the mammogram, but on the ultrasound this measured at [2.8] cm. The irregular mass is hypoechoic with posterior acoustic shadowing. Color flow imaging demonstrates increased vascularity. Elastography imaging assessment is hard. No significant abnormalities were seen in the right axilla.   Accordingly on 09/28/2018 she proceeded to biopsy of the right breast area in question. The pathology from this procedure showed ([id number]): invasive ductal carcinoma, Grade III. Prognostic indicators significant for: estrogen receptor, 95% positive and progesterone receptor, 95% positive, both with strong staining intensity. Proliferation marker Ki67 at 15%. HER2 negative immunohistochemcal and morphometric analysis 1+.  The patient's subsequent  history is as detailed below.   INTERVAL HISTORY: Tammy Grimes returns today for follow-up and treatment of her estrogen receptor positive breast cancer.   Since her last visit here, she underwent genetic testing performed through [facility] Common Hereditary Cancers panel + Melanoma panel on 10/11/2018 showing no pathogenic mutations in APC, ATM, AXIN2, BAP1, BARD1, BMPR1A, BRCA1, BRCA2, BRIP1, BUB1B, CDH1, CDK4, CDKN2A, CHEK2, CTNNA1, DICER1, ENG, EPCAM, GALNT12, GREM1, HOXB13, KIT, MEN1, MITF, MLH1, MLH3, MSH2, MSH3, MSH6, MUTYH, NBN, NF1, NTHL1, PALB2, PDGFRA, PMS2, POLD1, POLE, POT1 PTEN, RAD50, RAD51C, RAD51D, RB1, RNF43, RPS20, SDHA, SDHB, SDHC, SDHD, SMAD4, SMARCA4, STK11, TP53, TSC1, TSC2, VHL.  She also underwent a right lumpectomy on 11/07/2018. The pathology from this procedure showed ([id number]): 1. Breast, lumpectomy, Right w/seed - Invasive ductal carcinoma, grade 3, 2.1 cm in maximum extent, involving the closest (anterior) resection margin. - Ductal carcinoma in situ, approaching to 0.3 cm of the closest (inferior) resection margin.  2. Lymph node, sentinel, biopsy, Right axillary #1 - No metastatic carcinoma identified in one lymph node. 3. Lymph node, sentinel, biopsy, SLN Right - No metastatic carcinoma identified in one lymph node. 4. Lymph node, sentinel, biopsy, Right axillary #2 - No metastatic carcinoma identified in one lymph node. 5. Lymph node, sentinel, biopsy, SLN Right - No metastatic carcinoma identified in one lymph node. 6. Lymph node, sentinel, biopsy, Right axillary #3 - No metastatic carcinoma identified in one lymph node. 7. Lymph node, sentinel, biopsy, SLN Right - No metastatic carcinoma identified in one lymph node. 8. Lymph node, sentinel, biopsy, SLN Right   - No metastatic carcinoma identified in one lymph node.  The Oncotype DX score was 10 predicting a risk of outside the breast recurrence over the next 9 years of 3% if the patient's only systemic  therapy is tamoxifen for 5 years. It also predicts <1% benefit  from chemotherapy.  She began radiation on 12/15/2018 and is schedule for final treatment on 01/11/2019. She says that her skins is red and uncomfortable. She has also had some fatigue.    REVIEW OF SYSTEMS: Tammy Grimes said that she had some discomfort from her surgery, which she took Tylenol to treat. She went to the gym three days last week for exercise. She notes that her back and sciatica has been bothering her. The patient denies unusual headaches, visual changes, nausea, vomiting, or dizziness. There has been no unusual cough, phlegm production, or pleurisy. This been no change in bowel or bladder habits. The patient denies unexplained fatigue or unexplained weight loss, bleeding, rash, or fever. A detailed review of systems was otherwise noncontributory.    PAST MEDICAL HISTORY: Past Medical History:  Diagnosis Date  . Allergy   . Anemia    prior to hysterectomy  . BCC (basal cell carcinoma) 08/01/2015  . Breast cancer ([place]) 09/25/2018   right  . Cancer ([place]) 2007   calf of right leg- melanoma  . Cataract   . Cervical cancer screening 06/06/2012  . DDD (degenerative disc disease), cervical 08/01/2015  . Family history of bladder cancer   . Family history of breast cancer   . Family history of colon cancer   . Family history of ovarian cancer   . Family history of prostate cancer   . GERD (gastroesophageal reflux disease)   . Hyperglycemia 03/28/2017  . Hyperlipidemia   . Hypertension 65  . Hypothyroidism   . Knee pain 03/30/2012   L>R  . Low back pain 03/23/2018  . Medicare annual wellness visit, subsequent 02/09/2015  . Melanoma of skin ([place]) 08/19/2016  . PONV (postoperative nausea and vomiting)   . Preventative health care 03/30/2012  . Thyroid disease 64  . Vitamin D deficiency 06/06/2012     PAST SURGICAL HISTORY: Past Surgical History:  Procedure Laterality Date  . ABDOMINAL HYSTERECTOMY  1990's   total, for  heavy bleeding and fibroids  . BASAL CELL CARCINOMA EXCISION    . BREAST LUMPECTOMY WITH RADIOACTIVE SEED AND SENTINEL LYMPH NODE BIOPSY Right 11/07/2018   Procedure: RIGHT BREAST LUMPECTOMY WITH RADIOACTIVE SEED AND SENTINEL LYMPH NODE BIOPSY;  Surgeon: Tammy Klein, Grimes;  Location: [facility];  Service: General;  Laterality: Right;  . BREAST SURGERY  early 70's   fibroid tumors removed- benign  . CATARACT EXTRACTION Bilateral 12/09/12  . COLONOSCOPY    . EYE SURGERY Bilateral 2015   cataracts  . MELANOMA EXCISION  2007     FAMILY HISTORY: Family History  Problem Relation Age of Onset  . Hypertension Mother   . Alzheimer's disease Mother   . Dementia Mother        alzheimer's  . Aortic aneurysm Father   . Hypertension Father   . COPD Father        smoker  . Heart disease Father        s/p bypass, aortic aneurysm, carotid artery disease  . Breast cancer Sister 49  . Diabetes Son 22       type 1  . Alzheimer's disease Maternal Grandmother   . Prostate cancer Maternal Grandfather 60       metastatic  . Stroke Paternal Grandfather   . Ovarian cancer Maternal Aunt 75  . Cancer Maternal Aunt        cervical or uterine  . Colon cancer Paternal Uncle 73  . Breast cancer Cousin 35  . Breast cancer Cousin  32  . Colon cancer Cousin        dx >50  . Esophageal cancer Neg Hx   . Pancreatic cancer Neg Hx   . Rectal cancer Neg Hx   . Stomach cancer Neg Hx    Her father died from COPD at age 58. Patients' mother died from alzheimer's at age 57. The patient has no brothers, 1 sister. Her sister, [name], had breast cancer at the age of 72.  [name], is employed with the [employer].One of their maternal aunts had ovarian cancer, and a second maternal aunt had either uterine or cervical cancer. Tammy Grimes had Melanoma in situ on her right calf.   GYNECOLOGIC HISTORY:  Menarche: 73 years old Age at first live birth: 73 years old GX P: 1 LMP: No LMP  recorded. Patient has had a hysterectomy. 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 Her husband, Yvone Neu, worked as a English as a second language teacher for an Retail buyer.  He has now retired.  Tammy Grimes has one son, Careers information officer, age 63, who lives in [place] and is a Advertising copywriter as well as a Art gallery manager.     ADVANCED DIRECTIVES: Her husband, Yvone Neu is Forensic scientist (MPOA). If Yvone Neu is unable, then her son, Truddie Crumble, would assume MPOA.    HEALTH MAINTENANCE: Social History   Tobacco Use  . Smoking status: Never Smoker  . Smokeless tobacco: Never Used  Substance Use Topics  . Alcohol use: No  . Drug use: No    Colonoscopy: yes, 06/07/2017  PAP:   Bone density: yes, 07/25/2018   Allergies  Allergen Reactions  . Penicillins Rash    Current Outpatient Medications  Medication Sig Dispense Refill  . amLODipine (NORVASC) 5 MG tablet TAKE 1 TABLET(5 MG) BY MOUTH DAILY 90 tablet 1  . aspirin 81 MG tablet Take 81 mg by mouth daily.    . calcium-vitamin D 250-100 MG-UNIT per tablet Take 1 tablet by mouth daily.    . fluconazole (DIFLUCAN) 100 MG tablet Take 1 tablet (100 mg total) by mouth daily. 10 tablet 0  . KRILL OIL PO Take 1 capsule by mouth daily.    . metoprolol tartrate (LOPRESSOR) 25 MG tablet Take 1/2 tablet (12.58m ) by mouth two times a day 90 tablet 1  . omeprazole (PRILOSEC) 20 MG capsule Take 1 capsule (20 mg total) by mouth daily. 90 capsule 3  . rosuvastatin (CRESTOR) 5 MG tablet TAKE 1 TABLET(5 MG) BY MOUTH DAILY. Please make yearly appt with Dr. ESaunders Revelfor July before anymore refills. 1st attempt 90 tablet 1  . SYNTHROID 75 MCG tablet TAKE 1 TABLET(75 MCG) BY MOUTH DAILY BEFORE BREAKFAST  90 tablet 3  . tamoxifen (NOLVADEX) 20 MG tablet Take 1 tablet (20 mg total) by mouth daily for 30 days. 90 tablet 12  . Turmeric 500 MG TABS Take 500 mg by mouth daily.     No current facility-administered medications for this visit.     OBJECTIVE: Middle-aged white woman who appears stated age  V23   01/10/19 0855  BP: (!) 142/91  Pulse: 81  Resp: 18  Temp: 98 F (36.7 C)  SpO2: 96%     Body mass index is  25.42 kg/m.   Wt Readings from Last 3 Encounters:  01/10/19 139 lb (63 kg)  12/28/18 140 lb 9.6 oz (63.8 kg)  12/07/18 137 lb 9.6 oz (62.4 kg)      ECOG FS:1 - Symptomatic but completely ambulatory  Sclerae unicteric, EOMs intact Oropharynx clear and moist No cervical or supraclavicular adenopathy Lungs no rales or rhonchi Heart regular rate and rhythm Abd soft, nontender, positive bowel sounds MSK no focal spinal tenderness, no upper extremity lymphedema Neuro: nonfocal, well oriented, appropriate affect Breasts: Right breast is status post lumpectomy and is currently receiving radiation.  There is non-confluent erythema and there is an acneiform rash which is itchy and extends outside the radiation port area.  The left breast is unremarkable.  Both axillae are benign.   LAB RESULTS:  CMP     Component Value Date/Time   NA 142 12/28/2018 1009   K 4.2 12/28/2018 1009   CL 109 12/28/2018 1009   CO2 24 12/28/2018 1009   GLUCOSE 110 (H) 12/28/2018 1009   BUN 12 12/28/2018 1009   CREATININE 0.65 12/28/2018 1009   CREATININE 0.76 10/11/2018 1206   CREATININE 0.56 07/24/2013 1200   CALCIUM 9.2 12/28/2018 1009   PROT 6.4 12/28/2018 1009   ALBUMIN 4.4 12/28/2018 1009   AST 14 12/28/2018 1009   AST 22 10/11/2018 1206   ALT 14 12/28/2018 1009   ALT 25 10/11/2018 1206   ALKPHOS 62 12/28/2018 1009   BILITOT 0.5 12/28/2018 1009   BILITOT 0.6 10/11/2018 1206   GFRNONAA >60 10/11/2018 1206   GFRAA >60 10/11/2018 1206    No results found for: TOTALPROTELP,  ALBUMINELP, A1GS, A2GS, BETS, BETA2SER, GAMS, MSPIKE, SPEI  No results found for: Nils Pyle, KAPLAMBRATIO  Lab Results  Component Value Date   WBC 7.2 12/28/2018   NEUTROABS 5.1 10/11/2018   HGB 12.7 12/28/2018   HCT 38.3 12/28/2018   MCV 89.2 12/28/2018   PLT 205.0 12/28/2018    @LASTCHEMISTRY @  No results found for: LABCA2  No components found for: VQMGQQ761  No results for input(s): INR in the last 168 hours.  No results found for: LABCA2  No results found for: PJK932  No results found for: IZT245  No results found for: YKD983  No results found for: CA2729  No components found for: HGQUANT  No results found for: CEA1 / No results found for: CEA1   No results found for: AFPTUMOR  No results found for: CHROMOGRNA  No results found for: PSA1  No visits with results within 3 Day(s) from this visit.  Latest known visit with results is:  Office Visit on 12/28/2018  Component Date Value Ref Range Status  . Hgb A1c MFr Bld 12/28/2018 5.8  4.6 - 6.5 % Final   Glycemic Control Guidelines for People with Diabetes:Non Diabetic:  <6%Goal of Therapy: <7%Additional Action Suggested:  >8%   . WBC 12/28/2018 7.2  4.0 - 10.5 K/uL Final  . RBC 12/28/2018 4.30  3.87 - 5.11 Mil/uL Final  . Platelets 12/28/2018 205.0  150.0 - 400.0 K/uL Final  . Hemoglobin 12/28/2018 12.7  12.0 - 15.0 g/dL Final  . HCT 12/28/2018 38.3  36.0 - 46.0 % Final  . MCV 12/28/2018 89.2  78.0 - 100.0 fl Final  . MCHC 12/28/2018 33.1  30.0 - 36.0 g/dL Final  . RDW 12/28/2018 14.3  11.5 - 15.5 % Final  . Sodium 12/28/2018 142  135 - 145 mEq/L Final  . Potassium 12/28/2018 4.2  3.5 -  5.1 mEq/L Final  . Chloride 12/28/2018 109  96 - 112 mEq/L Final  . CO2 12/28/2018 24  19 - 32 mEq/L Final  . Glucose, Bld 12/28/2018 110* 70 - 99 mg/dL Final  . BUN 12/28/2018 12  6 - 23 mg/dL Final  . Creatinine, Ser 12/28/2018 0.65  0.40 - 1.20 mg/dL Final  . Total Bilirubin 12/28/2018 0.5  0.2 - 1.2  mg/dL Final  . Alkaline Phosphatase 12/28/2018 62  39 - 117 U/L Final  . AST 12/28/2018 14  0 - 37 U/L Final  . ALT 12/28/2018 14  0 - 35 U/L Final  . Total Protein 12/28/2018 6.4  6.0 - 8.3 g/dL Final  . Albumin 12/28/2018 4.4  3.5 - 5.2 g/dL Final  . Calcium 12/28/2018 9.2  8.4 - 10.5 mg/dL Final  . GFR 12/28/2018 89.83  >60.00 mL/min Final  . Cholesterol 12/28/2018 109  0 - 200 mg/dL Final   ATP III Classification       Desirable:  < 200 mg/dL               Borderline High:  200 - 239 mg/dL          High:  > = 240 mg/dL  . Triglycerides 12/28/2018 118.0  0.0 - 149.0 mg/dL Final   Normal:  <150 mg/dLBorderline High:  150 - 199 mg/dL  . HDL 12/28/2018 40.90  >39.00 mg/dL Final  . VLDL 12/28/2018 23.6  0.0 - 40.0 mg/dL Final  . LDL Cholesterol 12/28/2018 44  0 - 99 mg/dL Final  . Total CHOL/HDL Ratio 12/28/2018 3   Final                  Men          Women1/2 Average Risk     3.4          3.3Average Risk          5.0          4.42X Average Risk          9.6          7.13X Average Risk          15.0          11.0                      . NonHDL 12/28/2018 67.66   Final   NOTE:  Non-HDL goal should be 30 mg/dL higher than patient's LDL goal (i.e. LDL goal of < 70 mg/dL, would have non-HDL goal of < 100 mg/dL)  . TSH 12/28/2018 1.98  0.35 - 4.50 uIU/mL Final    (this displays the last labs from the last 3 days)  No results found for: TOTALPROTELP, ALBUMINELP, A1GS, A2GS, BETS, BETA2SER, GAMS, MSPIKE, SPEI (this displays SPEP labs)  No results found for: KPAFRELGTCHN, LAMBDASER, KAPLAMBRATIO (kappa/lambda light chains)  No results found for: HGBA, HGBA2QUANT, HGBFQUANT, HGBSQUAN (Hemoglobinopathy evaluation)   No results found for: LDH  No results found for: IRON, TIBC, IRONPCTSAT (Iron and TIBC)  No results found for: FERRITIN  Urinalysis No results found for: COLORURINE, APPEARANCEUR, LABSPEC, PHURINE, GLUCOSEU, HGBUR, BILIRUBINUR, KETONESUR, PROTEINUR, UROBILINOGEN, NITRITE,  LEUKOCYTESUR   STUDIES:  Genetics and Oncotype results discussed with the patient   ELIGIBLE FOR AVAILABLE RESEARCH PROTOCOL: no   ASSESSMENT: 73 y.o. [place] woman status post right breast lower inner quadrant biopsy 09/27/2018 for a clinical T1c N0 invasive ductal carcinoma, grade 3,  estrogen and progesterone receptor positive, HER-2 not amplified, with an MIB-1 of 15%  (1) Genetic testing performed through [facility] Common Hereditary Cancers panel + Melanoma panel on 10/11/2018 showed no pathogenic mutations in APC, ATM, AXIN2, BAP1, BARD1, BMPR1A, BRCA1, BRCA2, BRIP1, BUB1B, CDH1, CDK4, CDKN2A, CHEK2, CTNNA1, DICER1, ENG, EPCAM, GALNT12, GREM1, HOXB13, KIT, MEN1, MITF, MLH1, MLH3, MSH2, MSH3, MSH6, MUTYH, NBN, NF1, NTHL1, PALB2, PDGFRA, PMS2, POLD1, POLE, POT1 PTEN, RAD50, RAD51C, RAD51D, RB1, RNF43, RPS20, SDHA, SDHB, SDHC, SDHD, SMAD4, SMARCA4, STK11, TP53, TSC1, TSC2, VHL.  (2) status post right lumpectomy and sentinel lymph node sampling 11/07/2018 for a pT2 pN0, stage IIA, grade 3 with a positive anterior margin  (a) a total of 7 lymph nodes were removed  (3) The Oncotype DX score was 10 predicting a risk of outside the breast recurrence over the next 9 years of 3% if the patient's only systemic therapy is tamoxifen for 5 years. It also predicts <1% benefit from chemotherapy.  (4) adjuvant radiation to be completed 01/11/2019  (5) to start tamoxifen 02/07/2019   PLAN: Tammy Grimes did remarkably well with her surgery and is tolerating adjuvant radiation without any unusual side effects.  She completes that tomorrow  She understands she is still going to feel somewhat fatigued over the next couple of months, but by continuing to exercise 3-4 times a week as she is doing, even if she cannot do it very intensely, she will get out of that whole quicker.  She is now ready to discuss antiestrogens.  She understands that if she is able to take an antiestrogen for 5 years she  will have a 97% chance of not developing metastatic breast cancer over the next 9 years.  We discussed the difference between tamoxifen and anastrozole in detail. She understands that anastrozole and the aromatase inhibitors in general work by blocking estrogen production. Accordingly vaginal dryness, decrease in bone density, and of course hot flashes can result. The aromatase inhibitors can also negatively affect the cholesterol profile, although that is a minor effect. One out of 5 women on aromatase inhibitors we will feel "old and achy". This arthralgia/myalgia syndrome, which resembles fibromyalgia clinically, does resolve with stopping the medications. Accordingly this is not a reason to not try an aromatase inhibitor but it is a frequent reason to stop it (in other words 20% of women will not be able to tolerate these medications).  Tamoxifen on the other hand does not block estrogen production. It does not "take away a woman's estrogen". It blocks the estrogen receptor in breast cells. Like anastrozole, it can also cause hot flashes. As opposed to anastrozole, tamoxifen has many estrogen-like effects. It is technically an estrogen receptor modulator. This means that in some tissues tamoxifen works like estrogen-- for example it helps strengthen the bones. It tends to improve the cholesterol profile. It can cause thickening of the endometrial lining, and even endometrial polyps or rarely cancer of the uterus.(The risk of uterine cancer due to tamoxifen is one additional cancer per thousand women year). It can cause vaginal wetness or stickiness. It can cause blood clots through this estrogen-like effect--the risk of blood clots with tamoxifen is exactly the same as with birth control pills or hormone replacement.  Neither of these agents causes mood changes or weight gain, despite the popular belief that they can have these side effects. We have data from studies comparing either of these drugs with  placebo, and in those cases the control group had the same amount of  weight gain and depression as the group that took the drug.  Since that she is status post hysterectomy and since she took oral contraceptives for many years with no clotting complications, she is a particularly good candidate for tamoxifen and that is her choice.  She will started 02/07/2019 so she has a little time to recover from her current treatment.  She will see me in mid July and if she tolerates tamoxifen well the plan will be to continue it a total of 5 years  I am also prescribing Diflucan briefly to see if we can help clear the rash on her anterior chest wall.  She knows to call for any other issue that may develop before the next visit.  I spent approximately 30 minutes face to face with Tracey with more than 50% of that time spent in counseling and coordination of care.   Auren Valdes, Virgie Dad, Grimes  01/10/19 9:52 AM Medical Oncology and Hematology [facility] [address] [place], [place] [address] Tel. [phone]    Fax. [phone]   I, Jacqualyn Posey am acting as a Education administrator for Chauncey Cruel, Grimes.   I, Lurline Del Grimes, have reviewed the above documentation for accuracy and completeness, and I agree with the above.

## 2019-01-10 ENCOUNTER — Inpatient Hospital Stay: Payer: Medicare Other | Attending: Oncology | Admitting: Oncology

## 2019-01-10 ENCOUNTER — Ambulatory Visit
Admission: RE | Admit: 2019-01-10 | Discharge: 2019-01-10 | Disposition: A | Discharge status: Home / Self Care | Payer: Medicare Other | Source: Ambulatory Visit | Attending: Radiation Oncology | Admitting: Radiation Oncology

## 2019-01-10 ENCOUNTER — Ambulatory Visit: Payer: Medicare Other

## 2019-01-10 VITALS — BP 142/91 | HR 81 | Temp 98.0°F | Resp 18 | Ht 62.0 in | Wt 139.0 lb

## 2019-01-10 DIAGNOSIS — Z8042 Family history of malignant neoplasm of prostate: Secondary | ICD-10-CM

## 2019-01-10 DIAGNOSIS — C50311 Malignant neoplasm of lower-inner quadrant of right female breast: Secondary | ICD-10-CM

## 2019-01-10 DIAGNOSIS — Z803 Family history of malignant neoplasm of breast: Secondary | ICD-10-CM | POA: Diagnosis not present

## 2019-01-10 DIAGNOSIS — Z510 Encounter for antineoplastic radiation therapy: Secondary | ICD-10-CM | POA: Diagnosis not present

## 2019-01-10 DIAGNOSIS — Z79899 Other long term (current) drug therapy: Secondary | ICD-10-CM | POA: Diagnosis not present

## 2019-01-10 DIAGNOSIS — Z800 Family history of malignant neoplasm of digestive organs: Secondary | ICD-10-CM

## 2019-01-10 DIAGNOSIS — Z7982 Long term (current) use of aspirin: Secondary | ICD-10-CM

## 2019-01-10 DIAGNOSIS — E785 Hyperlipidemia, unspecified: Secondary | ICD-10-CM | POA: Diagnosis not present

## 2019-01-10 DIAGNOSIS — E559 Vitamin D deficiency, unspecified: Secondary | ICD-10-CM | POA: Diagnosis not present

## 2019-01-10 DIAGNOSIS — Z85828 Personal history of other malignant neoplasm of skin: Secondary | ICD-10-CM | POA: Diagnosis not present

## 2019-01-10 DIAGNOSIS — Z170 Estrogen receptor positive status [ER+]: Secondary | ICD-10-CM

## 2019-01-10 DIAGNOSIS — R5383 Other fatigue: Secondary | ICD-10-CM | POA: Diagnosis not present

## 2019-01-10 DIAGNOSIS — M545 Low back pain: Secondary | ICD-10-CM | POA: Diagnosis not present

## 2019-01-10 DIAGNOSIS — K219 Gastro-esophageal reflux disease without esophagitis: Secondary | ICD-10-CM | POA: Diagnosis not present

## 2019-01-10 DIAGNOSIS — I10 Essential (primary) hypertension: Secondary | ICD-10-CM

## 2019-01-10 DIAGNOSIS — Z8051 Family history of malignant neoplasm of kidney: Secondary | ICD-10-CM | POA: Diagnosis not present

## 2019-01-10 DIAGNOSIS — M5030 Other cervical disc degeneration, unspecified cervical region: Secondary | ICD-10-CM | POA: Diagnosis not present

## 2019-01-10 DIAGNOSIS — E039 Hypothyroidism, unspecified: Secondary | ICD-10-CM | POA: Diagnosis not present

## 2019-01-10 DIAGNOSIS — C50511 Malignant neoplasm of lower-outer quadrant of right female breast: Secondary | ICD-10-CM | POA: Diagnosis not present

## 2019-01-10 DIAGNOSIS — Z923 Personal history of irradiation: Secondary | ICD-10-CM | POA: Diagnosis not present

## 2019-01-10 DIAGNOSIS — Z85820 Personal history of malignant melanoma of skin: Secondary | ICD-10-CM

## 2019-01-10 DIAGNOSIS — Z8041 Family history of malignant neoplasm of ovary: Secondary | ICD-10-CM

## 2019-01-10 MED ORDER — TAMOXIFEN CITRATE 20 MG PO TABS: 20.0000 mg | ORAL_TABLET | Freq: Every day | ORAL | 12 refills | Status: AC

## 2019-01-10 MED ORDER — FLUCONAZOLE 100 MG PO TABS: 100.0000 mg | ORAL_TABLET | Freq: Every day | ORAL | 0 refills | Status: DC

## 2019-01-11 ENCOUNTER — Ambulatory Visit
Admission: RE | Admit: 2019-01-11 | Discharge: 2019-01-11 | Disposition: A | Discharge status: Home / Self Care | Payer: Medicare Other | Source: Ambulatory Visit | Attending: Radiation Oncology | Admitting: Radiation Oncology

## 2019-01-11 ENCOUNTER — Encounter: Payer: Self-pay | Admitting: Radiation Oncology

## 2019-01-11 DIAGNOSIS — Z170 Estrogen receptor positive status [ER+]: Secondary | ICD-10-CM | POA: Diagnosis not present

## 2019-01-11 DIAGNOSIS — C50511 Malignant neoplasm of lower-outer quadrant of right female breast: Secondary | ICD-10-CM | POA: Diagnosis not present

## 2019-01-11 DIAGNOSIS — Z510 Encounter for antineoplastic radiation therapy: Secondary | ICD-10-CM | POA: Diagnosis not present

## 2019-01-22 ENCOUNTER — Telehealth: Payer: Medicare Other | Admitting: Family

## 2019-01-22 DIAGNOSIS — R399 Unspecified symptoms and signs involving the genitourinary system: Secondary | ICD-10-CM | POA: Diagnosis not present

## 2019-01-22 MED ORDER — NITROFURANTOIN MONOHYD MACRO 100 MG PO CAPS: 100.0000 mg | ORAL_CAPSULE | Freq: Two times a day (BID) | ORAL | 0 refills | Status: DC

## 2019-01-22 NOTE — Progress Notes (Signed)
We are sorry that you are not feeling well.  Here is how we plan to help!  Based on what you shared with me it looks like you most likely have a simple urinary tract infection.  A UTI (Urinary Tract Infection) is a bacterial infection of the bladder.  Most cases of urinary tract infections are simple to treat but a key part of your care is to encourage you to drink plenty of fluids and watch your symptoms carefully.  I have prescribed MacroBid 100 mg twice a day for 5 days.  Your symptoms should gradually improve. Call us if the burning in your urine worsens, you develop worsening fever, back pain or pelvic pain or if your symptoms do not resolve after completing the antibiotic.  Approximately 5 minutes was spent documenting and reviewing patient's chart.   Urinary tract infections can be prevented by drinking plenty of water to keep your body hydrated.  Also be sure when you wipe, wipe from front to back and don't hold it in!  If possible, empty your bladder every 4 hours.  Your e-visit answers were reviewed by a board certified advanced clinical practitioner to complete your personal care plan.  Depending on the condition, your plan could have included both over the counter or prescription medications.  If there is a problem please reply  once you have received a response from your provider.  Your safety is important to us.  If you have drug allergies check your prescription carefully.    You can use MyChart to ask questions about today's visit, request a non-urgent call back, or ask for a work or school excuse for 24 hours related to this e-Visit. If it has been greater than 24 hours you will need to follow up with your provider, or enter a new e-Visit to address those concerns.   You will get an e-mail in the next two days asking about your experience.  I hope that your e-visit has been valuable and will speed your recovery. Thank you for using e-visits.    

## 2019-02-07 ENCOUNTER — Telehealth: Payer: Self-pay

## 2019-02-07 NOTE — Telephone Encounter (Signed)
Spoke with pt and advised that follow -up appointment will be conducted over the phone with ADara Lords PA on 4/21 at the scheduled appointment time. Pt verbalized understanding.

## 2019-02-14 NOTE — Progress Notes (Signed)
  Radiation Oncology         [phone] ________________________________  Name: Tammy Grimes MRN: [id number]  Date: 01/11/2019  DOB: 03/11/1946  End of Treatment Note  Diagnosis:   right-sided breast cancer     Indication for treatment:  Curative       Radiation treatment dates:   12/14/18 - 01/11/19  Site/dose:   The patient initially received a dose of 42.56 Gy in 16 fractions to the breast using whole-breast tangent fields. This was delivered using a 3-D conformal technique. The patient then received a boost to the seroma. This delivered an additional 8 Gy in 4 fractions using an en face electron field due to the depth of the seroma. The total dose was 52.56 Gy.  Narrative: The patient tolerated radiation treatment relatively well.   The patient had some expected skin irritation as she progressed during treatment. Moist desquamation was not present at the end of treatment.  Plan: The patient has completed radiation treatment. The patient will return to radiation oncology clinic for routine followup in one month. I advised the patient to call or return sooner if they have any questions or concerns related to their recovery or treatment. ________________________________  Jodelle Gross, M.D., Ph.D.

## 2019-02-26 ENCOUNTER — Telehealth: Payer: Self-pay | Admitting: Radiation Oncology

## 2019-02-26 NOTE — Telephone Encounter (Addendum)
  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  Interval Since Last Radiation:  7 weeks   12/14/18 - 01/11/19: The patient initially received a dose of 42.56 Gy in 16 fractions  to the breast using whole-breast tangent fields. This was delivered using a 3-D conformal technique. The patient then received a boost to the seroma. This delivered an additional 8 Gy in 4 fractions using an en face electron field due to the depth of the seroma. The total dose was 52.56 Gy.  Narrative:  The patient returns today for routine follow-up. During treatment she did very well with radiotherapy and did not have significant desquamation.   Impression/Plan: 1. 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 I had to leave a message asking her to return my call.     Carola Rhine, PAC

## 2019-02-27 ENCOUNTER — Ambulatory Visit: Payer: Self-pay | Admitting: Radiation Oncology

## 2019-02-27 ENCOUNTER — Telehealth: Payer: Self-pay | Admitting: Radiation Oncology

## 2019-02-27 NOTE — Telephone Encounter (Signed)
I was able to speak to the patient and reviewed that the patient feels that she is doing well overall.  She was treated for a yeast infection along the inframammary fold by Dr. Jana Hakim which she reports has improved.  She is counseled about skin care and avoiding sun exposure to the area, and will follow up with [place] as needed moving forward.  She is in agreement with this plan and was encouraged to call if she has questions or concerns.

## 2019-03-08 ENCOUNTER — Other Ambulatory Visit: Payer: Self-pay | Admitting: Family Medicine

## 2019-05-01 ENCOUNTER — Ambulatory Visit (INDEPENDENT_AMBULATORY_CARE_PROVIDER_SITE_OTHER): Payer: Medicare Other | Admitting: Family Medicine

## 2019-05-01 ENCOUNTER — Other Ambulatory Visit: Payer: Self-pay

## 2019-05-01 DIAGNOSIS — R0789 Other chest pain: Secondary | ICD-10-CM

## 2019-05-01 DIAGNOSIS — E559 Vitamin D deficiency, unspecified: Secondary | ICD-10-CM | POA: Diagnosis not present

## 2019-05-01 DIAGNOSIS — M5030 Other cervical disc degeneration, unspecified cervical region: Secondary | ICD-10-CM | POA: Diagnosis not present

## 2019-05-01 DIAGNOSIS — M25561 Pain in right knee: Secondary | ICD-10-CM | POA: Diagnosis not present

## 2019-05-01 DIAGNOSIS — R739 Hyperglycemia, unspecified: Secondary | ICD-10-CM

## 2019-05-01 DIAGNOSIS — I10 Essential (primary) hypertension: Secondary | ICD-10-CM

## 2019-05-01 DIAGNOSIS — Z170 Estrogen receptor positive status [ER+]: Secondary | ICD-10-CM

## 2019-05-01 DIAGNOSIS — E782 Mixed hyperlipidemia: Secondary | ICD-10-CM

## 2019-05-01 DIAGNOSIS — C50511 Malignant neoplasm of lower-outer quadrant of right female breast: Secondary | ICD-10-CM | POA: Diagnosis not present

## 2019-05-01 DIAGNOSIS — K2100 Gastro-esophageal reflux disease with esophagitis, without bleeding: Secondary | ICD-10-CM

## 2019-05-01 DIAGNOSIS — M25562 Pain in left knee: Secondary | ICD-10-CM

## 2019-05-01 MED ORDER — METOPROLOL TARTRATE 25 MG PO TABS: ORAL_TABLET | ORAL | 1 refills | Status: DC

## 2019-05-01 MED ORDER — ROSUVASTATIN CALCIUM 5 MG PO TABS: ORAL_TABLET | ORAL | 1 refills | Status: DC

## 2019-05-01 MED ORDER — AMLODIPINE BESYLATE 5 MG PO TABS: 5.0000 mg | ORAL_TABLET | Freq: Every day | ORAL | 1 refills | Status: DC

## 2019-05-01 NOTE — Assessment & Plan Note (Signed)
Avoid offending foods, start probiotics. Do not eat large meals in late evening and consider raising head of bed. No major complaints today

## 2019-05-01 NOTE — Assessment & Plan Note (Signed)
no changes to meds. Encouraged heart healthy diet such as the DASH diet and exercise as tolerated.  

## 2019-05-01 NOTE — Progress Notes (Signed)
Virtual Visit via Telephone Note  I connected with [name] on 05/01/19 at  9:00 AM EDT by a phone enabled telemedicine application and verified that I am speaking with the correct person using two identifiers.  Location: Patient: home Provider: home   I discussed the limitations of evaluation and management by telemedicine and the availability of in person appointments. The patient expressed understanding and agreed to proceed. Magdalene Molly, CMA attempted to get patient set up on a video visit but was unable so visit was completed via phone   Subjective:    Patient ID: Tammy Grimes, female    DOB: [birth year], 73 y.o.   MRN: [id number]  No chief complaint on file.   HPI Patient is in today for chronic medical concerns including hypertension, thyroid disease, hyperlipidemia and breast cancer.  She does note an increase in arthralgias and myalgia since starting tamoxifen on April 1.  Otherwise she is tolerated her treatments for breast cancer well.  No recent febrile illness or hospitalizations.  No acute concerns.  She is maintaining quarantine well.  Staying active as much as possible and notes when she exercises her aches and pains do improve. Denies CP/palp/SOB/HA/congestion/fevers/GI or GU c/o. Taking meds as prescribed  Past Medical History:  Diagnosis Date  . Allergy   . Anemia    prior to hysterectomy  . BCC (basal cell carcinoma) 08/01/2015  . Breast cancer ([place]) 09/25/2018   right  . Cancer ([place]) 2007   calf of right leg- melanoma  . Cataract   . Cervical cancer screening 06/06/2012  . DDD (degenerative disc disease), cervical 08/01/2015  . Family history of bladder cancer   . Family history of breast cancer   . Family history of colon cancer   . Family history of ovarian cancer   . Family history of prostate cancer   . GERD (gastroesophageal reflux disease)   . Hyperglycemia 03/28/2017  . Hyperlipidemia   . Hypertension 65  . Hypothyroidism   . Knee pain  03/30/2012   L>R  . Low back pain 03/23/2018  . Medicare annual wellness visit, subsequent 02/09/2015  . Melanoma of skin ([place]) 08/19/2016  . PONV (postoperative nausea and vomiting)   . Preventative health care 03/30/2012  . Thyroid disease 64  . Vitamin D deficiency 06/06/2012    Past Surgical History:  Procedure Laterality Date  . ABDOMINAL HYSTERECTOMY  1990's   total, for heavy bleeding and fibroids  . BASAL CELL CARCINOMA EXCISION    . BREAST LUMPECTOMY WITH RADIOACTIVE SEED AND SENTINEL LYMPH NODE BIOPSY Right 11/07/2018   Procedure: RIGHT BREAST LUMPECTOMY WITH RADIOACTIVE SEED AND SENTINEL LYMPH NODE BIOPSY;  Surgeon: [name], MD;  Location: [facility];  Service: General;  Laterality: Right;  . BREAST SURGERY  early 70's   fibroid tumors removed- benign  . CATARACT EXTRACTION Bilateral 12/09/12  . COLONOSCOPY    . EYE SURGERY Bilateral 2015   cataracts  . MELANOMA EXCISION  2007    Family History  Problem Relation Age of Onset  . Hypertension Mother   . Alzheimer's disease Mother   . Dementia Mother        alzheimer's  . Aortic aneurysm Father   . Hypertension Father   . COPD Father        smoker  . Heart disease Father        s/p bypass, aortic aneurysm, carotid artery disease  . Breast cancer Sister 25  . Diabetes Son 12  type 1  . Alzheimer's disease Maternal Grandmother   . Prostate cancer Maternal Grandfather 60       metastatic  . Stroke Paternal Grandfather   . Ovarian cancer Maternal Aunt 75  . Cancer Maternal Aunt        cervical or uterine  . Colon cancer Paternal Uncle 66  . Breast cancer Cousin 55  . Breast cancer Cousin 7  . Colon cancer Cousin        dx >50  . Esophageal cancer Neg Hx   . Pancreatic cancer Neg Hx   . Rectal cancer Neg Hx   . Stomach cancer Neg Hx     Social History   Socioeconomic History  . Marital status: Married    Spouse name: Not on file  . Number of children: Not on file  . Years of  education: Not on file  . Highest education level: Not on file  Occupational History  . Not on file  Social Needs  . Financial resource strain: Not on file  . Food insecurity    Worry: Not on file    Inability: Not on file  . Transportation needs    Medical: No    Non-medical: No  Tobacco Use  . Smoking status: Never Smoker  . Smokeless tobacco: Never Used  Substance and Sexual Activity  . Alcohol use: No  . Drug use: No  . Sexual activity: Yes    Comment: lives with husband, retired from [employer] work, no dietary restrictions  Lifestyle  . Physical activity    Days per week: Not on file    Minutes per session: Not on file  . Stress: Not on file  Relationships  . Social Herbalist on phone: Not on file    Gets together: Not on file    Attends religious service: Not on file    Active member of club or organization: Not on file    Attends meetings of clubs or organizations: Not on file    Relationship status: Not on file  . Intimate partner violence    Fear of current or ex partner: No    Emotionally abused: No    Physically abused: No    Forced sexual activity: No  Other Topics Concern  . Not on file  Social History Narrative  . Not on file    Outpatient Medications Prior to Visit  Medication Sig Dispense Refill  . aspirin 81 MG tablet Take 81 mg by mouth daily.    . calcium-vitamin D 250-100 MG-UNIT per tablet Take 1 tablet by mouth daily.    . fluconazole (DIFLUCAN) 100 MG tablet Take 1 tablet (100 mg total) by mouth daily. 10 tablet 0  . KRILL OIL PO Take 1 capsule by mouth daily.    . nitrofurantoin, macrocrystal-monohydrate, (MACROBID) 100 MG capsule Take 1 capsule (100 mg total) by mouth 2 (two) times daily. 1 po BId 10 capsule 0  . omeprazole (PRILOSEC) 20 MG capsule Take 1 capsule (20 mg total) by mouth daily. 90 capsule 3  . SYNTHROID 75 MCG tablet TAKE 1 TABLET(75 MCG) BY MOUTH DAILY BEFORE BREAKFAST 90 tablet 3  . Turmeric 500 MG TABS  Take 500 mg by mouth daily.    Marland Kitchen amLODipine (NORVASC) 5 MG tablet TAKE 1 TABLET(5 MG) BY MOUTH DAILY 90 tablet 1  . metoprolol tartrate (LOPRESSOR) 25 MG tablet Take 1/2 tablet (12.72m ) by mouth two times a day 90 tablet 1  .  rosuvastatin (CRESTOR) 5 MG tablet TAKE 1 TABLET BY MOUTH EVERY DAY 90 tablet 1   No facility-administered medications prior to visit.     Allergies  Allergen Reactions  . Penicillins Rash    Review of Systems  Constitutional: Negative for fever and malaise/fatigue.  HENT: Negative for congestion.   Eyes: Negative for blurred vision.  Respiratory: Negative for shortness of breath.   Cardiovascular: Negative for chest pain, palpitations and leg swelling.  Gastrointestinal: Negative for abdominal pain, blood in stool and nausea.  Genitourinary: Negative for dysuria and frequency.  Musculoskeletal: Positive for back pain, joint pain and myalgias. Negative for falls.  Skin: Negative for rash.  Neurological: Negative for dizziness, loss of consciousness and headaches.  Endo/Heme/Allergies: Negative for environmental allergies.  Psychiatric/Behavioral: Negative for depression. The patient is not nervous/anxious.        Objective:    Physical Exam unable to obtain via telephone  BP 118/80 (BP Location: Left Arm, Patient Position: Sitting, Cuff Size: Normal)   Pulse 79   Wt 134 lb 8 oz (61 kg)   BMI 24.60 kg/m  Wt Readings from Last 3 Encounters:  05/01/19 134 lb 8 oz (61 kg)  01/10/19 139 lb (63 kg)  12/28/18 140 lb 9.6 oz (63.8 kg)    Diabetic Foot Exam - Simple   No data filed     Lab Results  Component Value Date   WBC 7.2 12/28/2018   HGB 12.7 12/28/2018   HCT 38.3 12/28/2018   PLT 205.0 12/28/2018   GLUCOSE 110 (H) 12/28/2018   CHOL 109 12/28/2018   TRIG 118.0 12/28/2018   HDL 40.90 12/28/2018   LDLDIRECT 152.0 08/01/2015   LDLCALC 44 12/28/2018   ALT 14 12/28/2018   AST 14 12/28/2018   NA 142 12/28/2018   K 4.2 12/28/2018   CL 109  12/28/2018   CREATININE 0.65 12/28/2018   BUN 12 12/28/2018   CO2 24 12/28/2018   TSH 1.98 12/28/2018   HGBA1C 5.8 12/28/2018    Lab Results  Component Value Date   TSH 1.98 12/28/2018   Lab Results  Component Value Date   WBC 7.2 12/28/2018   HGB 12.7 12/28/2018   HCT 38.3 12/28/2018   MCV 89.2 12/28/2018   PLT 205.0 12/28/2018   Lab Results  Component Value Date   NA 142 12/28/2018   K 4.2 12/28/2018   CO2 24 12/28/2018   GLUCOSE 110 (H) 12/28/2018   BUN 12 12/28/2018   CREATININE 0.65 12/28/2018   BILITOT 0.5 12/28/2018   ALKPHOS 62 12/28/2018   AST 14 12/28/2018   ALT 14 12/28/2018   PROT 6.4 12/28/2018   ALBUMIN 4.4 12/28/2018   CALCIUM 9.2 12/28/2018   ANIONGAP 9 10/11/2018   GFR 89.83 12/28/2018   Lab Results  Component Value Date   CHOL 109 12/28/2018   Lab Results  Component Value Date   HDL 40.90 12/28/2018   Lab Results  Component Value Date   LDLCALC 44 12/28/2018   Lab Results  Component Value Date   TRIG 118.0 12/28/2018   Lab Results  Component Value Date   CHOLHDL 3 12/28/2018   Lab Results  Component Value Date   HGBA1C 5.8 12/28/2018       Assessment & Plan:   Problem List Items Addressed This Visit    GERD (gastroesophageal reflux disease)    Avoid offending foods, start probiotics. Do not eat large meals in late evening and consider raising head of bed. No major  complaints today      Essential hypertension     no changes to meds. Encouraged heart healthy diet such as the DASH diet and exercise as tolerated.       Relevant Medications   metoprolol tartrate (LOPRESSOR) 25 MG tablet   amLODipine (NORVASC) 5 MG tablet   rosuvastatin (CRESTOR) 5 MG tablet   Other Relevant Orders   Ambulatory referral to Cardiology   Hyperlipidemia    Encouraged heart healthy diet, increase exercise, avoid trans fats, consider a krill oil cap daily      Relevant Medications   metoprolol tartrate (LOPRESSOR) 25 MG tablet   amLODipine  (NORVASC) 5 MG tablet   rosuvastatin (CRESTOR) 5 MG tablet   Other Relevant Orders   Ambulatory referral to Cardiology   Knee pain    Struggling with pain but stays active. No recent fall or trauma      Vitamin D deficiency    Supplement and monitor      DDD (degenerative disc disease), cervical    Chronic pain and balance is struggling some with balance. Is considering a repeat course of PT if it worsens declines for now.      Hyperglycemia    hgba1c acceptable, minimize simple carbs. Increase exercise as tolerated.      Relevant Orders   Ambulatory referral to Cardiology   Atypical chest pain    No recent episodes, referred to cardiology to establish care. She requests Dr Burt Knack as she met his sister while she was undergoing chemo. Her previous cardiologist left town.      Malignant neoplasm of lower-outer quadrant of right breast of female, estrogen receptor positive ([facility])    Has been on Tamoxifen since Early April and notes an increase in myalgias and arthralgias but no other side effects. She feels better when she exercises         I have changed Olin Hauser K. Prete "[name]"'s amLODipine. I am also having her maintain her aspirin, KRILL OIL PO, calcium-vitamin D, Turmeric, omeprazole, Synthroid, fluconazole, nitrofurantoin (macrocrystal-monohydrate), metoprolol tartrate, and rosuvastatin.  Meds ordered this encounter  Medications  . metoprolol tartrate (LOPRESSOR) 25 MG tablet    Sig: Take 1/2 tablet (12.5m ) by mouth two times a day    Dispense:  90 tablet    Refill:  1  . amLODipine (NORVASC) 5 MG tablet    Sig: Take 1 tablet (5 mg total) by mouth daily.    Dispense:  90 tablet    Refill:  1  . rosuvastatin (CRESTOR) 5 MG tablet    Sig: TAKE 1 TABLET BY MOUTH EVERY DAY    Dispense:  90 tablet    Refill:  1     I discussed the assessment and treatment plan with the patient. The patient was provided an opportunity to ask questions and all were answered. The patient  agreed with the plan and demonstrated an understanding of the instructions.   The patient was advised to call back or seek an in-person evaluation if the symptoms worsen or if the condition fails to improve as anticipated.  I provided 25 minutes of non-face-to-face time during this encounter.   SPenni Homans MD

## 2019-05-01 NOTE — Assessment & Plan Note (Signed)
Supplement and monitor 

## 2019-05-01 NOTE — Assessment & Plan Note (Signed)
Has been on Tamoxifen since Early April and notes an increase in myalgias and arthralgias but no other side effects. She feels better when she exercises

## 2019-05-01 NOTE — Assessment & Plan Note (Addendum)
No recent episodes, referred to cardiology to establish care. She requests Dr Burt Knack as she met his sister while she was undergoing chemo. Her previous cardiologist left town.

## 2019-05-01 NOTE — Assessment & Plan Note (Signed)
Struggling with pain but stays active. No recent fall or trauma

## 2019-05-01 NOTE — Assessment & Plan Note (Signed)
Encouraged heart healthy diet, increase exercise, avoid trans fats, consider a krill oil cap daily 

## 2019-05-01 NOTE — Assessment & Plan Note (Signed)
Chronic pain and balance is struggling some with balance. Is considering a repeat course of PT if it worsens declines for now.

## 2019-05-01 NOTE — Assessment & Plan Note (Signed)
hgba1c acceptable, minimize simple carbs. Increase exercise as tolerated.  

## 2019-05-21 ENCOUNTER — Other Ambulatory Visit: Payer: Self-pay | Admitting: *Deleted

## 2019-05-21 DIAGNOSIS — Z170 Estrogen receptor positive status [ER+]: Secondary | ICD-10-CM

## 2019-05-21 DIAGNOSIS — C50511 Malignant neoplasm of lower-outer quadrant of right female breast: Secondary | ICD-10-CM

## 2019-05-22 ENCOUNTER — Inpatient Hospital Stay: Payer: Medicare Other

## 2019-05-22 ENCOUNTER — Other Ambulatory Visit: Payer: Self-pay

## 2019-05-22 ENCOUNTER — Inpatient Hospital Stay: Payer: Medicare Other | Attending: Oncology | Admitting: Oncology

## 2019-05-22 VITALS — BP 120/69 | HR 81 | Temp 99.1°F | Resp 18 | Ht 62.0 in | Wt 134.3 lb

## 2019-05-22 DIAGNOSIS — C50511 Malignant neoplasm of lower-outer quadrant of right female breast: Secondary | ICD-10-CM

## 2019-05-22 DIAGNOSIS — Z170 Estrogen receptor positive status [ER+]: Secondary | ICD-10-CM | POA: Insufficient documentation

## 2019-05-22 DIAGNOSIS — Z79899 Other long term (current) drug therapy: Secondary | ICD-10-CM | POA: Insufficient documentation

## 2019-05-22 DIAGNOSIS — Z8041 Family history of malignant neoplasm of ovary: Secondary | ICD-10-CM | POA: Insufficient documentation

## 2019-05-22 DIAGNOSIS — M5030 Other cervical disc degeneration, unspecified cervical region: Secondary | ICD-10-CM | POA: Insufficient documentation

## 2019-05-22 DIAGNOSIS — Z8052 Family history of malignant neoplasm of bladder: Secondary | ICD-10-CM | POA: Diagnosis not present

## 2019-05-22 DIAGNOSIS — R232 Flushing: Secondary | ICD-10-CM | POA: Diagnosis not present

## 2019-05-22 DIAGNOSIS — Z79810 Long term (current) use of selective estrogen receptor modulators (SERMs): Secondary | ICD-10-CM | POA: Insufficient documentation

## 2019-05-22 DIAGNOSIS — Z923 Personal history of irradiation: Secondary | ICD-10-CM | POA: Insufficient documentation

## 2019-05-22 DIAGNOSIS — Z85820 Personal history of malignant melanoma of skin: Secondary | ICD-10-CM | POA: Diagnosis not present

## 2019-05-22 DIAGNOSIS — K219 Gastro-esophageal reflux disease without esophagitis: Secondary | ICD-10-CM | POA: Insufficient documentation

## 2019-05-22 DIAGNOSIS — I10 Essential (primary) hypertension: Secondary | ICD-10-CM | POA: Insufficient documentation

## 2019-05-22 DIAGNOSIS — E559 Vitamin D deficiency, unspecified: Secondary | ICD-10-CM | POA: Diagnosis not present

## 2019-05-22 DIAGNOSIS — Z803 Family history of malignant neoplasm of breast: Secondary | ICD-10-CM | POA: Diagnosis not present

## 2019-05-22 DIAGNOSIS — E039 Hypothyroidism, unspecified: Secondary | ICD-10-CM | POA: Diagnosis not present

## 2019-05-22 DIAGNOSIS — Z7982 Long term (current) use of aspirin: Secondary | ICD-10-CM | POA: Diagnosis not present

## 2019-05-22 DIAGNOSIS — D649 Anemia, unspecified: Secondary | ICD-10-CM | POA: Diagnosis not present

## 2019-05-22 DIAGNOSIS — Z8042 Family history of malignant neoplasm of prostate: Secondary | ICD-10-CM | POA: Insufficient documentation

## 2019-05-22 DIAGNOSIS — Z800 Family history of malignant neoplasm of digestive organs: Secondary | ICD-10-CM | POA: Insufficient documentation

## 2019-05-22 LAB — CBC WITH DIFFERENTIAL (CANCER CENTER ONLY)
Abs Immature Granulocytes: 0.02 10*3/uL (ref 0.00–0.07)
Basophils Absolute: 0 10*3/uL (ref 0.0–0.1)
Basophils Relative: 0 %
Eosinophils Absolute: 0.1 10*3/uL (ref 0.0–0.5)
Eosinophils Relative: 1 %
HCT: 37 % (ref 36.0–46.0)
Hemoglobin: 12 g/dL (ref 12.0–15.0)
Immature Granulocytes: 0 %
Lymphocytes Relative: 11 %
Lymphs Abs: 1.1 10*3/uL (ref 0.7–4.0)
MCH: 30.5 pg (ref 26.0–34.0)
MCHC: 32.4 g/dL (ref 30.0–36.0)
MCV: 94.1 fL (ref 80.0–100.0)
Monocytes Absolute: 0.9 10*3/uL (ref 0.1–1.0)
Monocytes Relative: 9 %
Neutro Abs: 8.1 10*3/uL — ABNORMAL HIGH (ref 1.7–7.7)
Neutrophils Relative %: 79 %
Platelet Count: 168 10*3/uL (ref 150–400)
RBC: 3.93 MIL/uL (ref 3.87–5.11)
RDW: 13.2 % (ref 11.5–15.5)
WBC Count: 10.3 10*3/uL (ref 4.0–10.5)
nRBC: 0 % (ref 0.0–0.2)

## 2019-05-22 LAB — CMP (CANCER CENTER ONLY)
ALT: 23 U/L (ref 0–44)
AST: 21 U/L (ref 15–41)
Albumin: 4 g/dL (ref 3.5–5.0)
Alkaline Phosphatase: 56 U/L (ref 38–126)
Anion gap: 12 (ref 5–15)
BUN: 14 mg/dL (ref 8–23)
CO2: 20 mmol/L — ABNORMAL LOW (ref 22–32)
Calcium: 8.5 mg/dL — ABNORMAL LOW (ref 8.9–10.3)
Chloride: 108 mmol/L (ref 98–111)
Creatinine: 0.72 mg/dL (ref 0.44–1.00)
GFR, Est AFR Am: >60 mL/min (ref >60)
GFR, Estimated: >60 mL/min (ref >60)
Glucose, Bld: 107 mg/dL — ABNORMAL HIGH (ref 70–99)
Potassium: 4.1 mmol/L (ref 3.5–5.1)
Sodium: 140 mmol/L (ref 135–145)
Total Bilirubin: 0.6 mg/dL (ref 0.3–1.2)
Total Protein: 6.8 g/dL (ref 6.5–8.1)

## 2019-05-22 MED ORDER — VENLAFAXINE HCL ER 37.5 MG PO CP24: 37.5000 mg | ORAL_CAPSULE | Freq: Every day | ORAL | 4 refills | Status: DC

## 2019-05-22 MED ORDER — TAMOXIFEN CITRATE 20 MG PO TABS: 20.0000 mg | ORAL_TABLET | Freq: Every day | ORAL | 12 refills | Status: AC

## 2019-05-22 NOTE — Progress Notes (Signed)
[place]  Telephone:([phone]) [phone] Fax:([phone]) [phone]    ID: Tammy Grimes DOB: Mar 31, 1946  MR#: [id number]  QZR#:[id number]  Patient Care Team: [name], MD as PCP - General (Family Medicine) Martinique, Amy, MD as Consulting Physician (Dermatology) Rutherford Guys, MD as Consulting Physician (Ophthalmology) [name], MD as Consulting Physician (General Surgery) Magrinat, Virgie Dad, MD as Consulting Physician (Oncology) Kyung Rudd, MD as Consulting Physician (Radiation Oncology) Gatha Mayer, MD as Consulting Physician (Gastroenterology) Mauro Kaufmann, RN as Oncology Nurse Navigator Rockwell Germany, RN as Oncology Nurse Navigator OTHER MD: Dr. Purcell Nails, Dr. Sharyon Medicus DDS PA   CHIEF COMPLAINT: Estrogen receptor positive breast cancer  CURRENT TREATMENT: Tamoxifen   INTERVAL HISTORY: Tammy Grimes returns today for follow-up and treatment of her estrogen receptor positive breast cancer.   She continues on tamoxifen with good tolerance. She reports hot flashes.  She completed radiation treatments on 01/11/2019.   Since her last visit, she has not undergone any additional studies.  She had her diagnostic mammography mid November 2019.   REVIEW OF SYSTEMS: Tammy Grimes reports she does stretches and exercises every morning. She does, however, note pain to her back and knees. She states they have a place at [facility] that they enjoy going to. A detailed review of systems was otherwise noncontributory.     HISTORY OF CURRENT ILLNESS: From the original intake note:  "Tammy Grimes" had routine screening mammography on 09/11/2018 showing a possible abnormality in the right breast. She underwent right unilateral diagnostic mammography with tomography and right breast ultrasonography at [facility] on 09/21/2018 showing: Breast Density Category C. 1.3 cm irregular focal asymmetry in the right breast lower inner aspect anterior depth 4 cm from the nipple noted on the mammogram,  but on the ultrasound this measured at [2.8] cm. The irregular mass is hypoechoic with posterior acoustic shadowing. Color flow imaging demonstrates increased vascularity. Elastography imaging assessment is hard. No significant abnormalities were seen in the right axilla.   Accordingly on 09/28/2018 she proceeded to biopsy of the right breast area in question. The pathology from this procedure showed ([id number]): invasive ductal carcinoma, Grade III. Prognostic indicators significant for: estrogen receptor, 95% positive and progesterone receptor, 95% positive, both with strong staining intensity. Proliferation marker Ki67 at 15%. HER2 negative immunohistochemcal and morphometric analysis 1+.  The patient's subsequent history is as detailed below.   PAST MEDICAL HISTORY: Past Medical History:  Diagnosis Date  . Allergy   . Anemia    prior to hysterectomy  . BCC (basal cell carcinoma) 08/01/2015  . Breast cancer ([place]) 09/25/2018   right  . Cancer ([place]) 2007   calf of right leg- melanoma  . Cataract   . Cervical cancer screening 06/06/2012  . DDD (degenerative disc disease), cervical 08/01/2015  . Family history of bladder cancer   . Family history of breast cancer   . Family history of colon cancer   . Family history of ovarian cancer   . Family history of prostate cancer   . GERD (gastroesophageal reflux disease)   . Hyperglycemia 03/28/2017  . Hyperlipidemia   . Hypertension 65  . Hypothyroidism   . Knee pain 03/30/2012   L>R  . Low back pain 03/23/2018  . Medicare annual wellness visit, subsequent 02/09/2015  . Melanoma of skin ([place]) 08/19/2016  . PONV (postoperative nausea and vomiting)   . Preventative health care 03/30/2012  . Thyroid disease 64  . Vitamin D deficiency 06/06/2012  PAST SURGICAL HISTORY: Past Surgical History:  Procedure Laterality Date  . ABDOMINAL HYSTERECTOMY  1990's   total, for heavy bleeding and fibroids  . BASAL CELL CARCINOMA EXCISION    .  BREAST LUMPECTOMY WITH RADIOACTIVE SEED AND SENTINEL LYMPH NODE BIOPSY Right 11/07/2018   Procedure: RIGHT BREAST LUMPECTOMY WITH RADIOACTIVE SEED AND SENTINEL LYMPH NODE BIOPSY;  Surgeon: [name], MD;  Location: [facility];  Service: General;  Laterality: Right;  . BREAST SURGERY  early 70's   fibroid tumors removed- benign  . CATARACT EXTRACTION Bilateral 12/09/12  . COLONOSCOPY    . EYE SURGERY Bilateral 2015   cataracts  . MELANOMA EXCISION  2007     FAMILY HISTORY: Family History  Problem Relation Age of Onset  . Hypertension Mother   . Alzheimer's disease Mother   . Dementia Mother        alzheimer's  . Aortic aneurysm Father   . Hypertension Father   . COPD Father        smoker  . Heart disease Father        s/p bypass, aortic aneurysm, carotid artery disease  . Breast cancer Sister 22  . Diabetes Son 22       type 1  . Alzheimer's disease Maternal Grandmother   . Prostate cancer Maternal Grandfather 60       metastatic  . Stroke Paternal Grandfather   . Ovarian cancer Maternal Aunt 75  . Cancer Maternal Aunt        cervical or uterine  . Colon cancer Paternal Uncle 5  . Breast cancer Cousin 20  . Breast cancer Cousin 22  . Colon cancer Cousin        dx >50  . Esophageal cancer Neg Hx   . Pancreatic cancer Neg Hx   . Rectal cancer Neg Hx   . Stomach cancer Neg Hx    Her father died from COPD at age 3. Patients' mother died from alzheimer's at age 70. The patient has no brothers, 1 sister. Her sister, [name], had breast cancer at the age of 3.  [name], is employed with the [employer].One of their maternal aunts had ovarian cancer, and a second maternal aunt had either uterine or cervical cancer. Tammy Grimes had Melanoma in situ on her right calf.   GYNECOLOGIC HISTORY:  Menarche: 73 years old Age at first live birth: 73 years old GX P: 1 LMP: No LMP recorded. Patient has had a hysterectomy. 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 Her husband, Yvone Neu, worked as a English as a second language teacher for an Retail buyer.  He has now retired.  Tammy Grimes has one son, Careers information officer, age 74, who lives in [place] and is a Advertising copywriter as well as a Art gallery manager.     ADVANCED DIRECTIVES: Her husband, Yvone Neu is Forensic scientist (MPOA). If Yvone Neu is unable, then her son, Truddie Crumble, would assume MPOA.    HEALTH MAINTENANCE: Social History   Tobacco Use  . Smoking status: Never Smoker  . Smokeless tobacco: Never Used  Substance Use Topics  . Alcohol use: No  . Drug use: No    Colonoscopy: yes, 06/07/2017  PAP:   Bone density: yes,  07/25/2018   Allergies  Allergen Reactions  . Penicillins Rash    Current Outpatient Medications  Medication Sig Dispense Refill  . amLODipine (NORVASC) 5 MG tablet Take 1 tablet (5 mg total) by mouth daily. 90 tablet 1  . aspirin 81 MG tablet Take 81 mg by mouth daily.    . calcium-vitamin D 250-100 MG-UNIT per tablet Take 1 tablet by mouth daily.    . fluconazole (DIFLUCAN) 100 MG tablet Take 1 tablet (100 mg total) by mouth daily. 10 tablet 0  . KRILL OIL PO Take 1 capsule by mouth daily.    . metoprolol tartrate (LOPRESSOR) 25 MG tablet Take 1/2 tablet (12.19m ) by mouth two times a day 90 tablet 1  . nitrofurantoin, macrocrystal-monohydrate, (MACROBID) 100 MG capsule Take 1 capsule (100 mg total) by mouth 2 (two) times daily. 1 po BId 10 capsule 0  . omeprazole (PRILOSEC) 20 MG capsule Take 1 capsule (20 mg total) by mouth daily. 90 capsule 3  . rosuvastatin (CRESTOR) 5 MG tablet TAKE 1 TABLET BY MOUTH EVERY DAY 90 tablet 1  . SYNTHROID 75 MCG tablet TAKE 1 TABLET(75 MCG) BY MOUTH DAILY BEFORE  BREAKFAST 90 tablet 3  . tamoxifen (NOLVADEX) 20 MG tablet Take 1 tablet (20 mg total) by mouth daily. 90 tablet 12  . Turmeric 500 MG TABS Take 500 mg by mouth daily.    .Marland Kitchenvenlafaxine XR (EFFEXOR-XR) 37.5 MG 24 hr capsule Take 1 capsule (37.5 mg total) by mouth daily with breakfast. 90 capsule 4   No current facility-administered medications for this visit.     OBJECTIVE: Middle-aged white woman in no acute distress  Vitals:   05/22/19 1248  BP: 120/69  Pulse: 81  Resp: 18  Temp: 99.1 F (37.3 C)  SpO2: 99%     Body mass index is 24.56 kg/m.   Wt Readings from Last 3 Encounters:  05/22/19 134 lb 4.8 oz (60.9 kg)  05/01/19 134 lb 8 oz (61 kg)  01/10/19 139 lb (63 kg)      ECOG FS:1 - Symptomatic but completely ambulatory  Sclerae unicteric, pupils round and equal Wearing a mask No cervical or supraclavicular adenopathy Lungs no rales or rhonchi Heart regular rate and rhythm Abd soft, nontender, positive bowel sounds MSK no focal spinal tenderness, no upper extremity lymphedema Neuro: nonfocal, well oriented, appropriate affect Breasts: The right breast has undergone lumpectomy and radiation.  There is still a "blush", and there is some coarsening of the skin as expected from the recent treatment.  There is no evidence of disease recurrence.  The left breast is benign.  Both axillae are benign.  LAB RESULTS:  CMP     Component Value Date/Time   NA 140 05/22/2019 1213   K 4.1 05/22/2019 1213   CL 108 05/22/2019 1213   CO2 20 (L) 05/22/2019 1213   GLUCOSE 107 (H) 05/22/2019 1213   BUN 14 05/22/2019 1213   CREATININE 0.72 05/22/2019 1213   CREATININE 0.56 07/24/2013 1200   CALCIUM 8.5 (L) 05/22/2019 1213   PROT 6.8 05/22/2019 1213   ALBUMIN 4.0 05/22/2019 1213   AST 21 05/22/2019 1213   ALT 23 05/22/2019 1213   ALKPHOS 56 05/22/2019 1213   BILITOT 0.6 05/22/2019 1213   GFRNONAA >60 05/22/2019 1213   GFRAA >60 05/22/2019 1213    No results found for:  TOTALPROTELP, ALBUMINELP, A1GS, A2GS, BETS, BETA2SER, GAMS, MSPIKE, SPEI  No results found for: KPAFRELGTCHN, LAMBDASER, KAPLAMBRATIO  Lab Results  Component Value Date   WBC 10.3 05/22/2019   NEUTROABS 8.1 (H) 05/22/2019   HGB 12.0 05/22/2019   HCT 37.0 05/22/2019   MCV 94.1 05/22/2019   PLT 168 05/22/2019    @LASTCHEMISTRY @  No results found for: LABCA2  No components found for: [id number]  No results for input(s): INR in the last 168 hours.  No results found for: LABCA2  No results found for: QMG867  No results found for: YPP509  No results found for: TOI712  No results found for: CA2729  No components found for: HGQUANT  No results found for: CEA1 / No results found for: CEA1   No results found for: AFPTUMOR  No results found for: CHROMOGRNA  No results found for: PSA1  Appointment on 05/22/2019  Component Date Value Ref Range Status  . Sodium 05/22/2019 140  135 - 145 mmol/L Final  . Potassium 05/22/2019 4.1  3.5 - 5.1 mmol/L Final  . Chloride 05/22/2019 108  98 - 111 mmol/L Final  . CO2 05/22/2019 20* 22 - 32 mmol/L Final  . Glucose, Bld 05/22/2019 107* 70 - 99 mg/dL Final  . BUN 05/22/2019 14  8 - 23 mg/dL Final  . Creatinine 05/22/2019 0.72  0.44 - 1.00 mg/dL Final  . Calcium 05/22/2019 8.5* 8.9 - 10.3 mg/dL Final  . Total Protein 05/22/2019 6.8  6.5 - 8.1 g/dL Final  . Albumin 05/22/2019 4.0  3.5 - 5.0 g/dL Final  . AST 05/22/2019 21  15 - 41 U/L Final  . ALT 05/22/2019 23  0 - 44 U/L Final  . Alkaline Phosphatase 05/22/2019 56  38 - 126 U/L Final  . Total Bilirubin 05/22/2019 0.6  0.3 - 1.2 mg/dL Final  . GFR, Est Non Af Am 05/22/2019 >60  >60 mL/min Final  . GFR, Est AFR Am 05/22/2019 >60  >60 mL/min Final  . Anion gap 05/22/2019 12  5 - 15 Final   Performed at [facility], [place] [address] [place], [place] [address]  . WBC Count 05/22/2019 10.3  4.0 - 10.5 K/uL Final  . RBC 05/22/2019 3.93  3.87 - 5.11 MIL/uL Final  .  Hemoglobin 05/22/2019 12.0  12.0 - 15.0 g/dL Final  . HCT 05/22/2019 37.0  36.0 - 46.0 % Final  . MCV 05/22/2019 94.1  80.0 - 100.0 fL Final  . MCH 05/22/2019 30.5  26.0 - 34.0 pg Final  . MCHC 05/22/2019 32.4  30.0 - 36.0 g/dL Final  . RDW 05/22/2019 13.2  11.5 - 15.5 % Final  . Platelet Count 05/22/2019 168  150 - 400 K/uL Final  . nRBC 05/22/2019 0.0  0.0 - 0.2 % Final  . Neutrophils Relative % 05/22/2019 79  % Final  . Neutro Abs 05/22/2019 8.1* 1.7 - 7.7 K/uL Final  . Lymphocytes Relative 05/22/2019 11  % Final  . Lymphs Abs 05/22/2019 1.1  0.7 - 4.0 K/uL Final  . Monocytes Relative 05/22/2019 9  % Final  . Monocytes Absolute 05/22/2019 0.9  0.1 - 1.0 K/uL Final  . Eosinophils Relative 05/22/2019 1  % Final  . Eosinophils Absolute 05/22/2019 0.1  0.0 - 0.5 K/uL Final  . Basophils Relative 05/22/2019 0  % Final  . Basophils Absolute 05/22/2019 0.0  0.0 - 0.1 K/uL Final  . Immature Granulocytes 05/22/2019 0  % Final  . Abs Immature Granulocytes 05/22/2019 0.02  0.00 - 0.07 K/uL Final   Performed at [facility], [address] [place],  [place] [address]    (this displays the last labs from the last 3 days)  No results found for: TOTALPROTELP, ALBUMINELP, A1GS, A2GS, BETS, BETA2SER, GAMS, MSPIKE, SPEI (this displays SPEP labs)  No results found for: KPAFRELGTCHN, LAMBDASER, KAPLAMBRATIO (kappa/lambda light chains)  No results found for: HGBA, HGBA2QUANT, HGBFQUANT, HGBSQUAN (Hemoglobinopathy evaluation)   No results found for: LDH  No results found for: IRON, TIBC, IRONPCTSAT (Iron and TIBC)  No results found for: FERRITIN  Urinalysis No results found for: COLORURINE, APPEARANCEUR, LABSPEC, PHURINE, GLUCOSEU, HGBUR, BILIRUBINUR, KETONESUR, PROTEINUR, UROBILINOGEN, NITRITE, LEUKOCYTESUR   STUDIES:  Genetics and Oncotype results discussed with the patient   ELIGIBLE FOR AVAILABLE RESEARCH PROTOCOL: no   ASSESSMENT: 73 y.o. [place] woman status post right breast lower inner quadrant biopsy 09/27/2018 for a clinical T1c N0 invasive ductal carcinoma, grade 3, estrogen and progesterone receptor positive, HER-2 not amplified, with an MIB-1 of 15%  (1) Genetic testing performed through [facility] Common Hereditary Cancers panel + Melanoma panel on 10/11/2018 showed no pathogenic mutations in APC, ATM, AXIN2, BAP1, BARD1, BMPR1A, BRCA1, BRCA2, BRIP1, BUB1B, CDH1, CDK4, CDKN2A, CHEK2, CTNNA1, DICER1, ENG, EPCAM, GALNT12, GREM1, HOXB13, KIT, MEN1, MITF, MLH1, MLH3, MSH2, MSH3, MSH6, MUTYH, NBN, NF1, NTHL1, PALB2, PDGFRA, PMS2, POLD1, POLE, POT1 PTEN, RAD50, RAD51C, RAD51D, RB1, RNF43, RPS20, SDHA, SDHB, SDHC, SDHD, SMAD4, SMARCA4, STK11, TP53, TSC1, TSC2, VHL.  (2) status post right lumpectomy and sentinel lymph node sampling 11/07/2018 for a pT2 pN0, stage IIA, grade 3 with a positive anterior margin  (a) a total of 7 lymph nodes were removed  (3) The Oncotype DX score was 10 predicting a risk of outside the breast recurrence over the next 9 years of 3% if the patient's only systemic therapy is tamoxifen for 5 years. It also predicts <1% benefit from chemotherapy.  (4) adjuvant radiation to be completed 01/11/2019  (5) to start tamoxifen 02/07/2019  (a) D[facility]A scan at [facility] 07/25/2018 showed a T score of -2.0   PLAN: Tammy Grimes is now about half a year out from definitive surgery for her breast cancer with no evidence of disease activity.  This is very favorable.  She is generally tolerating tamoxifen well except for hot flashes.  I think she would benefit from low-dose venlafaxine.  We discussed the possible toxicities side effects and complications of this agent and I went ahead and placed the prescription in for her.  After she has been on venlafaxine for a few weeks she will let us know if it does not work and we can consider upping the dose at that point.  She is already scheduled to see Dr. Barry Dienes in August.  She will have  mammography in November and then return to see me shortly after that.  She knows to call for any other issue that may develop before the next visit.  Magrinat, Virgie Dad, MD  05/22/19 1:42 PM Medical Oncology and Hematology [facility] [address] [place], [place] [address] Tel. [phone]    Fax. [phone]    I, Wilburn Mylar, am acting as scribe for Dr. Virgie Dad. Magrinat.  I, Lurline Del MD, have reviewed the above documentation for accuracy and completeness, and I agree with the above.

## 2019-05-24 ENCOUNTER — Telehealth: Payer: Self-pay | Admitting: Oncology

## 2019-05-24 NOTE — Telephone Encounter (Signed)
I could not reach regarding schedule she was out of town

## 2019-06-12 ENCOUNTER — Encounter: Payer: Self-pay | Admitting: *Deleted

## 2019-06-13 ENCOUNTER — Telehealth: Payer: Self-pay | Admitting: Adult Health

## 2019-06-13 NOTE — Telephone Encounter (Signed)
Scheduled appt per 8/04 sch message - pt to get an updated schedule next visit.

## 2019-06-18 DIAGNOSIS — C50511 Malignant neoplasm of lower-outer quadrant of right female breast: Secondary | ICD-10-CM | POA: Diagnosis not present

## 2019-06-18 DIAGNOSIS — Z170 Estrogen receptor positive status [ER+]: Secondary | ICD-10-CM | POA: Diagnosis not present

## 2019-06-25 ENCOUNTER — Encounter: Payer: Self-pay | Admitting: Rehabilitation

## 2019-06-25 ENCOUNTER — Other Ambulatory Visit: Payer: Self-pay

## 2019-06-25 ENCOUNTER — Ambulatory Visit: Payer: Medicare Other | Attending: General Surgery | Admitting: Rehabilitation

## 2019-06-25 DIAGNOSIS — M25611 Stiffness of right shoulder, not elsewhere classified: Secondary | ICD-10-CM | POA: Insufficient documentation

## 2019-06-25 DIAGNOSIS — R293 Abnormal posture: Secondary | ICD-10-CM | POA: Diagnosis not present

## 2019-06-25 DIAGNOSIS — I890 Lymphedema, not elsewhere classified: Secondary | ICD-10-CM | POA: Diagnosis not present

## 2019-06-25 DIAGNOSIS — Z483 Aftercare following surgery for neoplasm: Secondary | ICD-10-CM | POA: Diagnosis not present

## 2019-06-25 NOTE — Therapy (Signed)
[place], [place], [address] Phone: [phone]   Fax:  [phone]  Physical Therapy Treatment  Patient Details  Name: Tammy Grimes MRN: [id number] Date of Birth: [birth year] Referring Provider (PT): Dr. Barry Dienes   Encounter Date: 06/25/2019  PT End of Session - 06/25/19 1652    Visit Number  1    Number of Visits  9    Date for PT Re-Evaluation  08/06/19    PT Start Time  1660    PT Stop Time  1640    PT Time Calculation (min)  45 min    Activity Tolerance  Patient tolerated treatment well    Behavior During Therapy  [facility] for tasks assessed/performed       Past Medical History:  Diagnosis Date  . Allergy   . Anemia    prior to hysterectomy  . BCC (basal cell carcinoma) 08/01/2015  . Breast cancer ([place]) 09/25/2018   right  . Cancer ([place]) 2007   calf of right leg- melanoma  . Cataract   . Cervical cancer screening 06/06/2012  . DDD (degenerative disc disease), cervical 08/01/2015  . Family history of bladder cancer   . Family history of breast cancer   . Family history of colon cancer   . Family history of ovarian cancer   . Family history of prostate cancer   . GERD (gastroesophageal reflux disease)   . Hyperglycemia 03/28/2017  . Hyperlipidemia   . Hypertension 65  . Hypothyroidism   . Knee pain 03/30/2012   L>R  . Low back pain 03/23/2018  . Medicare annual wellness visit, subsequent 02/09/2015  . Melanoma of skin ([place]) 08/19/2016  . PONV (postoperative nausea and vomiting)   . Preventative health care 03/30/2012  . Thyroid disease 64  . Vitamin D deficiency 06/06/2012    Past Surgical History:  Procedure Laterality Date  . ABDOMINAL HYSTERECTOMY  1990's   total, for heavy bleeding and fibroids  . BASAL CELL CARCINOMA EXCISION    . BREAST LUMPECTOMY WITH RADIOACTIVE SEED AND SENTINEL LYMPH NODE BIOPSY Right 11/07/2018   Procedure: RIGHT BREAST LUMPECTOMY WITH RADIOACTIVE SEED AND  SENTINEL LYMPH NODE BIOPSY;  Surgeon: [name], MD;  Location: [facility];  Service: General;  Laterality: Right;  . BREAST SURGERY  early 70's   fibroid tumors removed- benign  . CATARACT EXTRACTION Bilateral 12/09/12  . COLONOSCOPY    . EYE SURGERY Bilateral 2015   cataracts  . MELANOMA EXCISION  2007    There were no vitals filed for this visit.  Subjective Assessment - 06/25/19 1553    Subjective  Dr. Barry Dienes thinks I have some lymphedema in the Rt breast and some Rt breast stabbing pain.  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 Radiation completed 01/11/19. Started tamoxifen 02/07/19. Other HTN, high cholesterol    Limitations  --   reports none   Patient Stated Goals  decrease breast pain and swelling    Currently in Pain?  No/denies   intermittent stabbing pains maybe a few times per day   Aggravating Factors   nothing just random    Pain Relieving Factors  nothing they only last 1 second    Effect of Pain on Daily Activities  none         [facility] PT Assessment - 06/25/19  0001      Assessment   Medical Diagnosis  Rt breast cancer    Referring Provider (PT)  Dr. Barry Dienes    Onset Date/Surgical Date  11/07/18    Hand Dominance  Left    Prior Therapy  yes      Precautions   Precaution Comments  lymphedema Rt UE and breast      Balance Screen   Has the patient fallen in the past 6 months  No    Has the patient had a decrease in activity level because of a fear of falling?   No    Is the patient reluctant to leave their home because of a fear of falling?   No      Home Environment   Living Environment  Private residence    Living Arrangements  Spouse/significant other    Available Help at Discharge  Family      Prior Function   Level of Independence  Independent    Vocation  Retired    Leisure  read, Higher education careers adviser, alot of stretching  for my back       Cognition   Overall Cognitive Status  Within Functional Limits for tasks assessed      Observation/Other Assessments   Observations  bras: wearing a soft sports bra     Skin Integrity  Rt breast darker and retracted with edema evident laterally and around to the back of the shoulder blade      Sensation   Additional Comments  numbness in Rt axilla       Coordination   Gross Motor Movements are Fluid and Coordinated  Yes      Posture/Postural Control   Posture/Postural Control  Postural limitations      ROM / Strength   AROM / PROM / Strength  AROM      AROM   AROM Assessment Site  Shoulder    Right/Left Shoulder  Right;Left    Right Shoulder Flexion  135 Degrees    Right Shoulder ABduction  165 Degrees    Right Shoulder Internal Rotation  --   behind the back WNL   Right Shoulder External Rotation  --   behind the head 85% with pull   Left Shoulder Flexion  155 Degrees    Left Shoulder ABduction  165 Degrees    Left Shoulder Internal Rotation  --   behind the back WNL   Left Shoulder External Rotation  --   behind the head full     Palpation   Palpation comment  edema present Rt lateral trunk and breast and around into the Rt scapular region        LYMPHEDEMA/ONCOLOGY QUESTIONNAIRE - 06/25/19 1615      Type   Cancer Type  Right breast cancer      Surgeries   Lumpectomy Date  11/07/18    Sentinel Lymph Node Biopsy Date  11/07/18    Number Lymph Nodes Removed  7      Lymphedema Assessments   Lymphedema Assessments  Upper extremities      Right Upper Extremity Lymphedema   10 cm Proximal to Olecranon Process  27 cm    Olecranon Process  24.2 cm    10 cm Proximal to Ulnar Styloid Process  19.6 cm    Just Proximal to Ulnar Styloid Process  15.3 cm    Across Hand at [employer]  16.7 cm    At [place] of 2nd Digit  5.3 cm  Left Upper Extremity Lymphedema   10 cm Proximal to Olecranon Process  27.2 cm    Olecranon Process  24.5 cm     10 cm Proximal to Ulnar Styloid Process  19.5 cm    Just Proximal to Ulnar Styloid Process  15 cm    Across Hand at [employer]  17.5 cm    At [place] of 2nd Digit  5.5 cm                [facility] Adult PT Treatment/Exercise - 06/25/19 0001      Manual Therapy   Manual Therapy  Edema management    Manual therapy comments  made pt 1/2" gray foam swell spot shaped insert for her better bra at home covers breast and around to the back.  may be too much for the pt and she may want to swtich to just an axillary pad     Edema Management  discussed  breast lymphedema treatment to include compression bras, foam inserts, and learning MLD.  Pt agreeable to this but only able to do a weird schedule due to trips to the beach             PT Education - 06/25/19 1652    Education Details  treatment aspects, MLD    Person(s) Educated  Patient    Methods  Explanation    Comprehension  Verbalized understanding          PT Long Term Goals - 06/25/19 1657      PT LONG TERM GOAL #1   Title  Pt will be ind with self MLD for the Rt breast and lateral trunk    Time  6    Period  Weeks    Status  New      PT LONG TERM GOAL #2   Title  Pt will obtain appropriate compression bras and foam inserts for edema management    Time  6    Period  Weeks    Status  New            Plan - 06/25/19 1653    Clinical Impression Statement  Pt returns to treatment here with onset of Rt breast lymphedema after radiation. Pain and general edema superior breast and lateral breast working under the axilla towards the Rt scapula.  Rt shoulder decreased in flexion with axillary pull into flexion and abduction but pt reports no limitations.  No signs of UE lymphedema at this time.  Pt is agreeble to MLD and learning self MLD and a request for compression bras was sent over to Dr. Barry Dienes.    Personal Factors and Comorbidities  Comorbidity 1;Comorbidity 2    Comorbidities  radiation history, SLNB history     Stability/Clinical Decision Making  Stable/Uncomplicated    Clinical Decision Making  Low    Rehab Potential  Excellent    PT Frequency  2x / week    PT Duration  6 weeks    PT Treatment/Interventions  ADLs/Self Care Home Management;Patient/family education;Manual lymph drainage;Taping;Therapeutic exercise;Compression bandaging    PT Next Visit Plan  Rt breast/lateral chest/scapular region MLD with focus on teaching MLD as well for beach trip. (may need assistance or long handled sponge for back)  Check for script back for bras and modify foam as needed    Recommended Other Services  bras script sent 06/25/19    Consulted and Agree with Plan of Care  Patient       Patient will  benefit from skilled therapeutic intervention in order to improve the following deficits and impairments:  Decreased range of motion, Increased edema  Visit Diagnosis: 1. Lymphedema   2. Abnormal posture   3. Aftercare following surgery for neoplasm   4. Stiffness of right shoulder, not elsewhere classified        Problem List Patient Active Problem List   Diagnosis Date Noted  . Genetic testing 10/25/2018  . Family history of breast cancer   . Family history of ovarian cancer   . Family history of prostate cancer   . Family history of colon cancer   . Family history of bladder cancer   . Malignant neoplasm of lower-outer quadrant of right breast of female, estrogen receptor positive ([place]) 10/04/2018  . Knee pain, bilateral 03/23/2018  . Atypical chest pain 05/07/2017  . Hyperglycemia 03/28/2017  . Shortness of breath 03/28/2017  . Melanoma of skin ([place]) 08/19/2016  . BCC (basal cell carcinoma) 08/01/2015  . DDD (degenerative disc disease), cervical 08/01/2015  . Medicare annual wellness visit, subsequent 02/09/2015  . Cervical cancer screening 06/06/2012  . Osteopenia 06/06/2012  . Vitamin D deficiency 06/06/2012  . Preventative health care 03/30/2012  . Knee pain 03/30/2012  . Thyroid disease    . GERD (gastroesophageal reflux disease)   . Essential hypertension   . Anemia   . Allergy   . Hyperlipidemia     Stark Bray 06/25/2019, 4:58 PM  [place], [place], [address] Phone: [phone]   Fax:  [phone]  Name: Tammy Grimes MRN: [id number] Date of Birth: 22-Feb-1946

## 2019-06-25 NOTE — Patient Instructions (Signed)
Wear foam in better sports bra at home at least 29min per day

## 2019-06-26 ENCOUNTER — Ambulatory Visit: Payer: Medicare Other

## 2019-06-26 DIAGNOSIS — Z483 Aftercare following surgery for neoplasm: Secondary | ICD-10-CM

## 2019-06-26 DIAGNOSIS — I890 Lymphedema, not elsewhere classified: Secondary | ICD-10-CM

## 2019-06-26 DIAGNOSIS — R293 Abnormal posture: Secondary | ICD-10-CM

## 2019-06-26 DIAGNOSIS — M25611 Stiffness of right shoulder, not elsewhere classified: Secondary | ICD-10-CM | POA: Diagnosis not present

## 2019-06-26 NOTE — Therapy (Signed)
[place], [place], [address] Phone: [phone]   Fax:  [phone]  Physical Therapy Treatment  Patient Details  Name: Tammy Grimes MRN: [id number] Date of Birth: 1946-03-01 Referring Provider (PT): Dr. Barry Dienes   Encounter Date: 06/26/2019  PT End of Session - 06/26/19 0855    Visit Number  2    Number of Visits  9    Date for PT Re-Evaluation  08/06/19    PT Start Time  0800    PT Stop Time  0854    PT Time Calculation (min)  54 min    Activity Tolerance  Patient tolerated treatment well    Behavior During Therapy  [facility] for tasks assessed/performed       Past Medical History:  Diagnosis Date  . Allergy   . Anemia    prior to hysterectomy  . BCC (basal cell carcinoma) 08/01/2015  . Breast cancer ([place]) 09/25/2018   right  . Cancer ([place]) 2007   calf of right leg- melanoma  . Cataract   . Cervical cancer screening 06/06/2012  . DDD (degenerative disc disease), cervical 08/01/2015  . Family history of bladder cancer   . Family history of breast cancer   . Family history of colon cancer   . Family history of ovarian cancer   . Family history of prostate cancer   . GERD (gastroesophageal reflux disease)   . Hyperglycemia 03/28/2017  . Hyperlipidemia   . Hypertension 65  . Hypothyroidism   . Knee pain 03/30/2012   L>R  . Low back pain 03/23/2018  . Medicare annual wellness visit, subsequent 02/09/2015  . Melanoma of skin ([place]) 08/19/2016  . PONV (postoperative nausea and vomiting)   . Preventative health care 03/30/2012  . Thyroid disease 64  . Vitamin D deficiency 06/06/2012    Past Surgical History:  Procedure Laterality Date  . ABDOMINAL HYSTERECTOMY  1990's   total, for heavy bleeding and fibroids  . BASAL CELL CARCINOMA EXCISION    . BREAST LUMPECTOMY WITH RADIOACTIVE SEED AND SENTINEL LYMPH NODE BIOPSY Right 11/07/2018   Procedure: RIGHT BREAST LUMPECTOMY WITH RADIOACTIVE SEED AND  SENTINEL LYMPH NODE BIOPSY;  Surgeon: [name], MD;  Location: [facility];  Service: General;  Laterality: Right;  . BREAST SURGERY  early 70's   fibroid tumors removed- benign  . CATARACT EXTRACTION Bilateral 12/09/12  . COLONOSCOPY    . EYE SURGERY Bilateral 2015   cataracts  . MELANOMA EXCISION  2007    There were no vitals filed for this visit.  Subjective Assessment - 06/26/19 0802    Subjective  Nothing new since evaluation yesterday afternoon.    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 Radiation completed 01/11/19. Started tamoxifen 02/07/19. Other HTN, high cholesterol    Patient Stated Goals  decrease breast pain and swelling    Currently in Pain?  No/denies                       [facility] Adult PT Treatment/Exercise - 06/26/19 0001      Manual Therapy   Manual Therapy  Manual Lymphatic Drainage (MLD)    Manual Lymphatic Drainage (MLD)  In Supine: Short neck, 5 diaphragmatic breaths, Rt inguinal and Lt axillary nodes, Rt axillo-inguinal and anterior inter-axillary anastomosis, then focused on Rt breast, then Lt S/L for focus at lateral breast (  seroma palpable or very fibrotic??) and then further work along [place] and at posterior inter-axillary anastomosis, then finished retracing all steps in supine instructing pt throughout and having her return demo.              PT Education - 06/26/19 0903    Education Details  Self MLD to Rt breast    Person(s) Educated  Patient    Methods  Explanation;Demonstration;Verbal cues;Handout    Comprehension  Verbalized understanding;Returned demonstration;Verbal cues required;Tactile cues required;Need further instruction          PT Long Term Goals - 06/25/19 1657      PT LONG TERM GOAL #1   Title  Pt will be ind with self MLD for the Rt breast and lateral trunk    Time  6    Period  Weeks     Status  New      PT LONG TERM GOAL #2   Title  Pt will obtain appropriate compression bras and foam inserts for edema management    Time  6    Period  Weeks    Status  New            Plan - 06/26/19 0857    Clinical Impression Statement  Instructed pt, while performing, manual lymph drainage to the Rt breast having her return demo and used hand over hand technique to instruct. Pt was able to return fairly good demo though will require further review at next session. Instructed her today, which is earlier than usual, due to pt is not able to get back with [place] for a week due to full schedule and she was willing to try to learn, even though this was only first session of MLD.    Personal Factors and Comorbidities  Comorbidity 1;Comorbidity 2    Comorbidities  radiation history, SLNB history    Stability/Clinical Decision Making  Stable/Uncomplicated    Rehab Potential  Excellent    PT Frequency  2x / week    PT Duration  6 weeks    PT Treatment/Interventions  ADLs/Self Care Home Management;Patient/family education;Manual lymph drainage;Taping;Therapeutic exercise;Compression bandaging    PT Next Visit Plan  Rt breast/lateral chest/scapular region MLD with focus on teaching/reviewing MLD. (may need assistance or long handled sponge for back)  Check for script back for bras and modify foam as needed    PT Home Exercise Plan  Begin practicing self MLD    Consulted and Agree with Plan of Care  Patient       Patient will benefit from skilled therapeutic intervention in order to improve the following deficits and impairments:  Decreased range of motion, Increased edema  Visit Diagnosis: 1. Lymphedema   2. Abnormal posture   3. Aftercare following surgery for neoplasm   4. Stiffness of right shoulder, not elsewhere classified        Problem List Patient Active Problem List   Diagnosis Date Noted  . Genetic testing 10/25/2018  . Family history of breast cancer   . Family history of  ovarian cancer   . Family history of prostate cancer   . Family history of colon cancer   . Family history of bladder cancer   . Malignant neoplasm of lower-outer quadrant of right breast of female, estrogen receptor positive ([place]) 10/04/2018  . Knee pain, bilateral 03/23/2018  . Atypical chest pain 05/07/2017  . Hyperglycemia 03/28/2017  . Shortness of breath 03/28/2017  . Melanoma of skin ([place]) 08/19/2016  . BCC (  basal cell carcinoma) 08/01/2015  . DDD (degenerative disc disease), cervical 08/01/2015  . Medicare annual wellness visit, subsequent 02/09/2015  . Cervical cancer screening 06/06/2012  . Osteopenia 06/06/2012  . Vitamin D deficiency 06/06/2012  . Preventative health care 03/30/2012  . Knee pain 03/30/2012  . Thyroid disease   . GERD (gastroesophageal reflux disease)   . Essential hypertension   . Anemia   . Allergy   . Hyperlipidemia     Otelia Limes, PTA 06/26/2019, 9:04 AM  [place], [place], [address] Phone: [phone]   Fax:  [phone]  Name: Tammy Grimes MRN: [id number] Date of Birth: 1946/07/21

## 2019-06-26 NOTE — Patient Instructions (Signed)
Self manual lymph drainage: Perform this sequence once a day.  Only give enough pressure to your skin to make the skin move.  Hug yourself.  Do circles at your neck just above your collarbones.  Repeat this 10 times.  Diaphragmatic - Supine   Inhale through nose making navel move out toward hands. Exhale through puckered lips, hands follow navel in. Repeat _5__ times. Rest _10__ seconds between repeats.   Axilla - One at a Time   Using full weight of flat hand and fingers at center of uninvolved armpit, make _10__ in-place circles.   Copyright  VHI. All rights reserved.  LEG: Inguinal Nodes Stimulation   With small finger side of hand against hip crease on involved side, gently perform circles at the crease. Repeat __10_ times.   Copyright  VHI. All rights reserved.  1) Axilla to Inguinal Nodes - Sweep   On involved side, pump _4__ times from armpit along side of trunk to hip crease.  Now gently stretch skin from the involved side to the uninvolved side across the chest at the shoulder line.  Repeat that 4 times.  Draw an imaginary diagonal line from upper outer breast through the nipple area toward lower inner breast.  Direct fluid upward and inward from this line toward the pathway across your upper chest .  Do this in three rows to treat all of the upper inner breast tissue, and do each row 3-4x.      Direct fluid to treat all of lower outer breast tissue downward and outward toward pathway that is aimed at the right groin.  Finish by doing the pathways as described above going from your involved armpit to the same side groin and going across your upper chest from the involved shoulder to the uninvolved shoulder.  Repeat the steps above where you do circles in your right groin and left armpit.  Cancer Rehab [phone]

## 2019-06-28 ENCOUNTER — Ambulatory Visit: Payer: Medicare Other | Admitting: Physical Therapy

## 2019-07-04 ENCOUNTER — Other Ambulatory Visit: Payer: Self-pay

## 2019-07-04 ENCOUNTER — Ambulatory Visit: Payer: Medicare Other | Admitting: Rehabilitation

## 2019-07-04 ENCOUNTER — Encounter: Payer: Self-pay | Admitting: Rehabilitation

## 2019-07-04 DIAGNOSIS — Z483 Aftercare following surgery for neoplasm: Secondary | ICD-10-CM

## 2019-07-04 DIAGNOSIS — I890 Lymphedema, not elsewhere classified: Secondary | ICD-10-CM | POA: Diagnosis not present

## 2019-07-04 DIAGNOSIS — R293 Abnormal posture: Secondary | ICD-10-CM | POA: Diagnosis not present

## 2019-07-04 DIAGNOSIS — M25611 Stiffness of right shoulder, not elsewhere classified: Secondary | ICD-10-CM | POA: Diagnosis not present

## 2019-07-04 NOTE — Therapy (Signed)
[place], [place], [address] Phone: [phone]   Fax:  [phone]  Physical Therapy Treatment  Patient Details  Name: Tammy Grimes MRN: [id number] Date of Birth: [birth year] Referring Provider (PT): Dr. Barry Dienes   Encounter Date: 07/04/2019  PT End of Session - 07/04/19 1451    Visit Number  3    Number of Visits  9    Date for PT Re-Evaluation  08/06/19    PT Start Time  [id number]    PT Stop Time  1443    PT Time Calculation (min)  47 min    Activity Tolerance  Patient tolerated treatment well    Behavior During Therapy  [facility] for tasks assessed/performed       Past Medical History:  Diagnosis Date  . Allergy   . Anemia    prior to hysterectomy  . BCC (basal cell carcinoma) 08/01/2015  . Breast cancer ([place]) 09/25/2018   right  . Cancer ([place]) 2007   calf of right leg- melanoma  . Cataract   . Cervical cancer screening 06/06/2012  . DDD (degenerative disc disease), cervical 08/01/2015  . Family history of bladder cancer   . Family history of breast cancer   . Family history of colon cancer   . Family history of ovarian cancer   . Family history of prostate cancer   . GERD (gastroesophageal reflux disease)   . Hyperglycemia 03/28/2017  . Hyperlipidemia   . Hypertension 65  . Hypothyroidism   . Knee pain 03/30/2012   L>R  . Low back pain 03/23/2018  . Medicare annual wellness visit, subsequent 02/09/2015  . Melanoma of skin ([place]) 08/19/2016  . PONV (postoperative nausea and vomiting)   . Preventative health care 03/30/2012  . Thyroid disease 64  . Vitamin D deficiency 06/06/2012    Past Surgical History:  Procedure Laterality Date  . ABDOMINAL HYSTERECTOMY  1990's   total, for heavy bleeding and fibroids  . BASAL CELL CARCINOMA EXCISION    . BREAST LUMPECTOMY WITH RADIOACTIVE SEED AND SENTINEL LYMPH NODE BIOPSY Right 11/07/2018   Procedure: RIGHT BREAST LUMPECTOMY WITH RADIOACTIVE SEED AND  SENTINEL LYMPH NODE BIOPSY;  Surgeon: [name], MD;  Location: [place];  Service: General;  Laterality: Right;  . BREAST SURGERY  early 70's   fibroid tumors removed- benign  . CATARACT EXTRACTION Bilateral 12/09/12  . COLONOSCOPY    . EYE SURGERY Bilateral 2015   cataracts  . MELANOMA EXCISION  2007    There were no vitals filed for this visit.  Subjective Assessment - 07/04/19 1358    Subjective  Still having some pain lateral trunk. Most of the swelling seems to be laterally.  I think I am doing it right.    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 Radiation completed 01/11/19. Started tamoxifen 02/07/19. Other HTN, high cholesterol    Patient Stated Goals  decrease breast pain and swelling    Currently in Pain?  No/denies                       [facility] Adult PT Treatment/Exercise - 07/04/19 0001      Manual Therapy   Manual Therapy  Soft tissue mobilization;Manual Lymphatic Drainage (MLD);Taping    Manual therapy comments  gave pt prescription for bras and instruction on how to make an appointment  Edema Management  gave pt chip pack for lateral fibrosis    Soft tissue mobilization  in sidelying to the latissimus, Rt lateral fibrosis and scapular muscles    Manual Lymphatic Drainage (MLD)  with pt permission In Supine: Short neck, superficial and deep abdominals, diaphragmatic breathing, Rt inguinal and Lt axillary nodes, Rt axillo-inguinal and anterior inter-axillary anastomosis, then focused on Rt lateral breast and trunk, then Lt S/L for focus at lateral breast (seroma palpable or very fibrotic??) and then further work along Rt axillo-inguinal and at posterior inter-axillary anastomosis, then finished retracing all steps in supine instructing pt throughout and having her return demo.  Some work towards Rt axillary nodes with Rt arm in flexion due to pocket in the  axilla.      Kinesiotex  Edema      Kinesiotix   Edema  bucket on lateral breast and 4 prongs towards spine             PT Education - 07/04/19 1451    Education Details  removal of tape safely,compression bras    Person(s) Educated  Patient    Methods  Explanation    Comprehension  Verbalized understanding          PT Long Term Goals - 06/25/19 1657      PT LONG TERM GOAL #1   Title  Pt will be ind with self MLD for the Rt breast and lateral trunk    Time  6    Period  Weeks    Status  New      PT LONG TERM GOAL #2   Title  Pt will obtain appropriate compression bras and foam inserts for edema management    Time  6    Period  Weeks    Status  New            Plan - 07/04/19 1452    Clinical Impression Statement  pt reports feeling better overall with the majority of the issues remaining at the lateral breast and trunk with pain and tenderness here and towards the scapula.  continued with MLD and continued pt instruction.  added some STM to the Rt scapular and fibrotic areas gentle pressure.  Hard to tell if area is seroma. Discussed if MLD/bras/foam don't take care of it that pt could meet with Dr. Barry Dienes.  Area does soften with MLD.  Tape trial today.  pt uses KT on her knee and is familiar with its use.    PT Treatment/Interventions  ADLs/Self Care Home Management;Patient/family education;Manual lymph drainage;Taping;Therapeutic exercise;Compression bandaging    PT Next Visit Plan  Get bra appt? How was tape?  How was chip pack/better than foam? Rt lateral breast/lateral chest/scapular region MLD with focus on teaching/reviewing MLD. (may need assistance or long handled sponge for back)    Consulted and Agree with Plan of Care  Patient       Patient will benefit from skilled therapeutic intervention in order to improve the following deficits and impairments:     Visit Diagnosis: Lymphedema  Abnormal posture  Aftercare following surgery for  neoplasm     Problem List Patient Active Problem List   Diagnosis Date Noted  . Genetic testing 10/25/2018  . Family history of breast cancer   . Family history of ovarian cancer   . Family history of prostate cancer   . Family history of colon cancer   . Family history of bladder cancer   . Malignant neoplasm of lower-outer quadrant of  right breast of female, estrogen receptor positive ([place]) 10/04/2018  . Knee pain, bilateral 03/23/2018  . Atypical chest pain 05/07/2017  . Hyperglycemia 03/28/2017  . Shortness of breath 03/28/2017  . Melanoma of skin ([place]) 08/19/2016  . BCC (basal cell carcinoma) 08/01/2015  . DDD (degenerative disc disease), cervical 08/01/2015  . Medicare annual wellness visit, subsequent 02/09/2015  . Cervical cancer screening 06/06/2012  . Osteopenia 06/06/2012  . Vitamin D deficiency 06/06/2012  . Preventative health care 03/30/2012  . Knee pain 03/30/2012  . Thyroid disease   . GERD (gastroesophageal reflux disease)   . Essential hypertension   . Anemia   . Allergy   . Hyperlipidemia     Stark Bray 07/04/2019, 2:56 PM  [place], [place], [address] Phone: [phone]   Fax:  [phone]  Name: Tammy Grimes MRN: [id number] Date of Birth: [birth year]

## 2019-07-06 ENCOUNTER — Other Ambulatory Visit: Payer: Self-pay

## 2019-07-06 ENCOUNTER — Encounter: Payer: Self-pay | Admitting: Physical Therapy

## 2019-07-06 ENCOUNTER — Ambulatory Visit: Payer: Medicare Other | Admitting: Physical Therapy

## 2019-07-06 DIAGNOSIS — I890 Lymphedema, not elsewhere classified: Secondary | ICD-10-CM | POA: Diagnosis not present

## 2019-07-06 DIAGNOSIS — M25611 Stiffness of right shoulder, not elsewhere classified: Secondary | ICD-10-CM | POA: Diagnosis not present

## 2019-07-06 DIAGNOSIS — Z483 Aftercare following surgery for neoplasm: Secondary | ICD-10-CM | POA: Diagnosis not present

## 2019-07-06 DIAGNOSIS — R293 Abnormal posture: Secondary | ICD-10-CM | POA: Diagnosis not present

## 2019-07-06 NOTE — Therapy (Signed)
[place], [place], [address] Phone: [phone]   Fax:  [phone]  Physical Therapy Treatment  Patient Details  Name: Tammy Grimes MRN: [id number] Date of Birth: May 15, 1946 Referring Provider (PT): Dr. Barry Dienes   Encounter Date: 07/06/2019  PT End of Session - 07/06/19 1121    Visit Number  4    Number of Visits  9    Date for PT Re-Evaluation  08/06/19    PT Start Time  1031    PT Stop Time  1120    PT Time Calculation (min)  49 min    Activity Tolerance  Patient tolerated treatment well    Behavior During Therapy  [facility] for tasks assessed/performed       Past Medical History:  Diagnosis Date  . Allergy   . Anemia    prior to hysterectomy  . BCC (basal cell carcinoma) 08/01/2015  . Breast cancer ([place]) 09/25/2018   right  . Cancer ([place]) 2007   calf of right leg- melanoma  . Cataract   . Cervical cancer screening 06/06/2012  . DDD (degenerative disc disease), cervical 08/01/2015  . Family history of bladder cancer   . Family history of breast cancer   . Family history of colon cancer   . Family history of ovarian cancer   . Family history of prostate cancer   . GERD (gastroesophageal reflux disease)   . Hyperglycemia 03/28/2017  . Hyperlipidemia   . Hypertension 65  . Hypothyroidism   . Knee pain 03/30/2012   L>R  . Low back pain 03/23/2018  . Medicare annual wellness visit, subsequent 02/09/2015  . Melanoma of skin ([place]) 08/19/2016  . PONV (postoperative nausea and vomiting)   . Preventative health care 03/30/2012  . Thyroid disease 64  . Vitamin D deficiency 06/06/2012    Past Surgical History:  Procedure Laterality Date  . ABDOMINAL HYSTERECTOMY  1990's   total, for heavy bleeding and fibroids  . BASAL CELL CARCINOMA EXCISION    . BREAST LUMPECTOMY WITH RADIOACTIVE SEED AND SENTINEL LYMPH NODE BIOPSY Right 11/07/2018   Procedure: RIGHT BREAST LUMPECTOMY WITH RADIOACTIVE SEED AND  SENTINEL LYMPH NODE BIOPSY;  Surgeon: [name], MD;  Location: [place];  Service: General;  Laterality: Right;  . BREAST SURGERY  early 70's   fibroid tumors removed- benign  . CATARACT EXTRACTION Bilateral 12/09/12  . COLONOSCOPY    . EYE SURGERY Bilateral 2015   cataracts  . MELANOMA EXCISION  2007    There were no vitals filed for this visit.  Subjective Assessment - 07/06/19 1032    Subjective  My side is still touchy and a little puffy but it has been much better since I started therapy. I have an appointment Monday to be fitted for a compression bra. I tried the chip pack yesterday. It is more comfortable than the foam but I can not tell a difference in the swelling. I could not tell if the tape helped.    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 Radiation completed 01/11/19. Started tamoxifen 02/07/19. Other HTN, high cholesterol    Patient Stated Goals  decrease breast pain and swelling    Currently in Pain?  No/denies                       [facility] Adult PT Treatment/Exercise - 07/06/19 0001  Manual Therapy   Soft tissue mobilization  using biotone in sidelying to the latissimus, Rt lateral fibrosis and scapular muscles    Manual Lymphatic Drainage (MLD)  In Supine: Short neck, superficial and deep abdominals, Rt inguinal and Lt axillary nodes, Rt axillo-inguinal and anterior inter-axillary anastomosis, then focused on Rt lateral breast and trunk, then Lt S/L for focus at lateral breast (seroma palpable or very fibrotic??) and then further work along Rt axillo-inguinal and at posterior inter-axillary anastomosis, then finished retracing all steps    Kinesiotex  Edema   removed tape and did not reapply due to redness                 PT Long Term Goals - 06/25/19 1657      PT LONG TERM GOAL #1   Title  Pt will be ind with self MLD for the Rt  breast and lateral trunk    Time  6    Period  Weeks    Status  New      PT LONG TERM GOAL #2   Title  Pt will obtain appropriate compression bras and foam inserts for edema management    Time  6    Period  Weeks    Status  New            Plan - 07/06/19 1123    Clinical Impression Statement  Continued with soft tissue mobilization to right lateral trunk and periscapular area where pt was tight and had fibrosis. The performed MLD with focus on R lateral trunk and lateral breast. Fibrosis softened signficantly by end of session.    Rehab Potential  Excellent    PT Frequency  2x / week    PT Duration  6 weeks    PT Treatment/Interventions  ADLs/Self Care Home Management;Patient/family education;Manual lymph drainage;Taping;Therapeutic exercise;Compression bandaging    PT Next Visit Plan  Rt lateral breast/lateral chest/scapular region MLD with focus on teaching/reviewing MLD. (may need assistance or long handled sponge for back)    [place] practicing self MLD       Patient will benefit from skilled therapeutic intervention in order to improve the following deficits and impairments:     Visit Diagnosis: Lymphedema  Aftercare following surgery for neoplasm     Problem List Patient Active Problem List   Diagnosis Date Noted  . Genetic testing 10/25/2018  . Family history of breast cancer   . Family history of ovarian cancer   . Family history of prostate cancer   . Family history of colon cancer   . Family history of bladder cancer   . Malignant neoplasm of lower-outer quadrant of right breast of female, estrogen receptor positive ([place]) 10/04/2018  . Knee pain, bilateral 03/23/2018  . Atypical chest pain 05/07/2017  . Hyperglycemia 03/28/2017  . Shortness of breath 03/28/2017  . Melanoma of skin ([place]) 08/19/2016  . BCC (basal cell carcinoma) 08/01/2015  . DDD (degenerative disc disease), cervical 08/01/2015  . Medicare annual wellness visit,  subsequent 02/09/2015  . Cervical cancer screening 06/06/2012  . Osteopenia 06/06/2012  . Vitamin D deficiency 06/06/2012  . Preventative health care 03/30/2012  . Knee pain 03/30/2012  . Thyroid disease   . GERD (gastroesophageal reflux disease)   . Essential hypertension   . Anemia   . Allergy   . Hyperlipidemia     Allyson Sabal [facility] 07/06/2019, 11:25 AM  [place], [place],  [id number] Phone: [phone]   Fax:  [phone]  Name: Eriyonna Bielec Luevano MRN: [id number] Date of Birth: 1946/01/20  Manus Gunning, PT 07/06/19 11:25 AM

## 2019-07-12 ENCOUNTER — Other Ambulatory Visit: Payer: Self-pay

## 2019-07-12 ENCOUNTER — Ambulatory Visit: Payer: Medicare Other | Attending: General Surgery | Admitting: Rehabilitation

## 2019-07-12 ENCOUNTER — Encounter: Payer: Self-pay | Admitting: Rehabilitation

## 2019-07-12 DIAGNOSIS — C50511 Malignant neoplasm of lower-outer quadrant of right female breast: Secondary | ICD-10-CM | POA: Insufficient documentation

## 2019-07-12 DIAGNOSIS — I890 Lymphedema, not elsewhere classified: Secondary | ICD-10-CM | POA: Diagnosis not present

## 2019-07-12 DIAGNOSIS — Z170 Estrogen receptor positive status [ER+]: Secondary | ICD-10-CM | POA: Insufficient documentation

## 2019-07-12 DIAGNOSIS — R293 Abnormal posture: Secondary | ICD-10-CM | POA: Diagnosis not present

## 2019-07-12 DIAGNOSIS — Z483 Aftercare following surgery for neoplasm: Secondary | ICD-10-CM | POA: Diagnosis not present

## 2019-07-12 DIAGNOSIS — M25611 Stiffness of right shoulder, not elsewhere classified: Secondary | ICD-10-CM | POA: Insufficient documentation

## 2019-07-12 NOTE — Therapy (Signed)
[place], [place], [address] Phone: [phone]   Fax:  [phone]  Physical Therapy Treatment  Patient Details  Name: Tammy Grimes MRN: [id number] Date of Birth: Jul 22, 1946 Referring Provider (PT): Dr. Barry Dienes   Encounter Date: 07/12/2019  PT End of Session - 07/12/19 1129    Visit Number  5    Number of Visits  9    Date for PT Re-Evaluation  08/06/19    PT Start Time  1030    PT Stop Time  1116    PT Time Calculation (min)  46 min    Activity Tolerance  Patient tolerated treatment well    Behavior During Therapy  [facility] for tasks assessed/performed       Past Medical History:  Diagnosis Date  . Allergy   . Anemia    prior to hysterectomy  . BCC (basal cell carcinoma) 08/01/2015  . Breast cancer ([place]) 09/25/2018   right  . Cancer ([place]) 2007   calf of right leg- melanoma  . Cataract   . Cervical cancer screening 06/06/2012  . DDD (degenerative disc disease), cervical 08/01/2015  . Family history of bladder cancer   . Family history of breast cancer   . Family history of colon cancer   . Family history of ovarian cancer   . Family history of prostate cancer   . GERD (gastroesophageal reflux disease)   . Hyperglycemia 03/28/2017  . Hyperlipidemia   . Hypertension 65  . Hypothyroidism   . Knee pain 03/30/2012   L>R  . Low back pain 03/23/2018  . Medicare annual wellness visit, subsequent 02/09/2015  . Melanoma of skin ([place]) 08/19/2016  . PONV (postoperative nausea and vomiting)   . Preventative health care 03/30/2012  . Thyroid disease 64  . Vitamin D deficiency 06/06/2012    Past Surgical History:  Procedure Laterality Date  . ABDOMINAL HYSTERECTOMY  1990's   total, for heavy bleeding and fibroids  . BASAL CELL CARCINOMA EXCISION    . BREAST LUMPECTOMY WITH RADIOACTIVE SEED AND SENTINEL LYMPH NODE BIOPSY Right 11/07/2018   Procedure: RIGHT BREAST LUMPECTOMY WITH RADIOACTIVE SEED AND  SENTINEL LYMPH NODE BIOPSY;  Surgeon: [name], MD;  Location: [facility];  Service: General;  Laterality: Right;  . BREAST SURGERY  early 70's   fibroid tumors removed- benign  . CATARACT EXTRACTION Bilateral 12/09/12  . COLONOSCOPY    . EYE SURGERY Bilateral 2015   cataracts  . MELANOMA EXCISION  2007    There were no vitals filed for this visit.  Subjective Assessment - 07/12/19 1031    Subjective  got some compression bras on Monday  I have been sleeping in them.   I am not having any pain in the shoulder blade.  Still sore in the armpit.    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 Radiation completed 01/11/19. Started tamoxifen 02/07/19. Other HTN, high cholesterol    Currently in Pain?  No/denies                       [facility] Adult PT Treatment/Exercise - 07/12/19 0001      Manual Therapy   Manual therapy comments  discussed seroma in axilla versus swelling    Soft tissue mobilization  using biotone in sidelying to the latissimus, Rt lateral fibrosis and scapular muscles    Manual  Lymphatic Drainage (MLD)  In Supine: Short neck, superficial and deep abdominals, Rt inguinal and Lt axillary nodes, Rt axillo-inguinal and anterior inter-axillary anastomosis, then focused on Rt lateral breast and trunk, then Lt S/L for focus at lateral breast and then further work along Rt axillo-inguinal and at posterior inter-axillary anastomosis, then finished retracing all steps                  PT Long Term Goals - 07/12/19 1131      PT LONG TERM GOAL #1   Title  Pt will be ind with self MLD for the Rt breast and lateral trunk    Status  Achieved      PT LONG TERM GOAL #2   Title  Pt will obtain appropriate compression bras and foam inserts for edema management      PT LONG TERM GOAL #3   Title  Patient will increase right shoulder abduction to >/= 165 degrees for  increased ease reaching overhead.            Plan - 07/12/19 1129    Clinical Impression Statement  Pt has improved since PT last saw her. No pain around the scapular region, now has bras for compression, and less fibrosis laterally.  Discussed seroma vs lymphedem in the axilla as pt may need to consider aspiration if this does not change.  Overall pt is progressing well.    PT Frequency  2x / week    PT Duration  6 weeks    PT Treatment/Interventions  ADLs/Self Care Home Management;Patient/family education;Manual lymph drainage;Taping;Therapeutic exercise;Compression bandaging    PT Next Visit Plan  Rt lateral breast/lateral chest/scapular region MLD with focus on teaching/reviewing MLD. (may need assistance or long handled sponge for back)    Consulted and Agree with Plan of Care  Patient       Patient will benefit from skilled therapeutic intervention in order to improve the following deficits and impairments:     Visit Diagnosis: Lymphedema  Aftercare following surgery for neoplasm  Abnormal posture  Stiffness of right shoulder, not elsewhere classified  Malignant neoplasm of lower-outer quadrant of right breast of female, estrogen receptor positive ([place])     Problem List Patient Active Problem List   Diagnosis Date Noted  . Genetic testing 10/25/2018  . Family history of breast cancer   . Family history of ovarian cancer   . Family history of prostate cancer   . Family history of colon cancer   . Family history of bladder cancer   . Malignant neoplasm of lower-outer quadrant of right breast of female, estrogen receptor positive ([place]) 10/04/2018  . Knee pain, bilateral 03/23/2018  . Atypical chest pain 05/07/2017  . Hyperglycemia 03/28/2017  . Shortness of breath 03/28/2017  . Melanoma of skin ([place]) 08/19/2016  . BCC (basal cell carcinoma) 08/01/2015  . DDD (degenerative disc disease), cervical 08/01/2015  . Medicare annual wellness visit, subsequent 02/09/2015   . Cervical cancer screening 06/06/2012  . Osteopenia 06/06/2012  . Vitamin D deficiency 06/06/2012  . Preventative health care 03/30/2012  . Knee pain 03/30/2012  . Thyroid disease   . GERD (gastroesophageal reflux disease)   . Essential hypertension   . Anemia   . Allergy   . Hyperlipidemia     Stark Bray 07/12/2019, 11:32 AM  [place], [place], [address] Phone: [phone]   Fax:  [phone]  Name: Tammy Grimes MRN: [id number] Date of  Birth: 1946/09/03

## 2019-07-18 ENCOUNTER — Other Ambulatory Visit: Payer: Self-pay

## 2019-07-18 ENCOUNTER — Ambulatory Visit: Payer: Medicare Other | Admitting: Rehabilitation

## 2019-07-18 ENCOUNTER — Encounter: Payer: Self-pay | Admitting: Rehabilitation

## 2019-07-18 DIAGNOSIS — I890 Lymphedema, not elsewhere classified: Secondary | ICD-10-CM | POA: Diagnosis not present

## 2019-07-18 DIAGNOSIS — R293 Abnormal posture: Secondary | ICD-10-CM | POA: Diagnosis not present

## 2019-07-18 DIAGNOSIS — Z483 Aftercare following surgery for neoplasm: Secondary | ICD-10-CM

## 2019-07-18 DIAGNOSIS — Z170 Estrogen receptor positive status [ER+]: Secondary | ICD-10-CM | POA: Diagnosis not present

## 2019-07-18 DIAGNOSIS — M25611 Stiffness of right shoulder, not elsewhere classified: Secondary | ICD-10-CM

## 2019-07-18 DIAGNOSIS — C50511 Malignant neoplasm of lower-outer quadrant of right female breast: Secondary | ICD-10-CM

## 2019-07-18 NOTE — Therapy (Signed)
[place], [place], [address] Phone: [phone]   Fax:  [phone]  Physical Therapy Treatment  Patient Details  Name: Tammy Grimes MRN: [id number] Date of Birth: 24-Nov-1945 Referring Provider (PT): Dr. Barry Dienes   Encounter Date: 07/18/2019  PT End of Session - 07/18/19 1454    Visit Number  6    Number of Visits  9    Date for PT Re-Evaluation  08/06/19    PT Start Time  1401    PT Stop Time  1447    PT Time Calculation (min)  46 min    Activity Tolerance  Patient tolerated treatment well    Behavior During Therapy  [facility] for tasks assessed/performed       Past Medical History:  Diagnosis Date  . Allergy   . Anemia    prior to hysterectomy  . BCC (basal cell carcinoma) 08/01/2015  . Breast cancer ([place]) 09/25/2018   right  . Cancer ([place]) 2007   calf of right leg- melanoma  . Cataract   . Cervical cancer screening 06/06/2012  . DDD (degenerative disc disease), cervical 08/01/2015  . Family history of bladder cancer   . Family history of breast cancer   . Family history of colon cancer   . Family history of ovarian cancer   . Family history of prostate cancer   . GERD (gastroesophageal reflux disease)   . Hyperglycemia 03/28/2017  . Hyperlipidemia   . Hypertension 65  . Hypothyroidism   . Knee pain 03/30/2012   L>R  . Low back pain 03/23/2018  . Medicare annual wellness visit, subsequent 02/09/2015  . Melanoma of skin ([place]) 08/19/2016  . PONV (postoperative nausea and vomiting)   . Preventative health care 03/30/2012  . Thyroid disease 64  . Vitamin D deficiency 06/06/2012    Past Surgical History:  Procedure Laterality Date  . ABDOMINAL HYSTERECTOMY  1990's   total, for heavy bleeding and fibroids  . BASAL CELL CARCINOMA EXCISION    . BREAST LUMPECTOMY WITH RADIOACTIVE SEED AND SENTINEL LYMPH NODE BIOPSY Right 11/07/2018   Procedure: RIGHT BREAST LUMPECTOMY WITH RADIOACTIVE SEED AND  SENTINEL LYMPH NODE BIOPSY;  Surgeon: Tammy Klein, MD;  Location: [place];  Service: General;  Laterality: Right;  . BREAST SURGERY  early 70's   fibroid tumors removed- benign  . CATARACT EXTRACTION Bilateral 12/09/12  . COLONOSCOPY    . EYE SURGERY Bilateral 2015   cataracts  . MELANOMA EXCISION  2007    There were no vitals filed for this visit.  Subjective Assessment - 07/18/19 1401    Subjective  I am feeling good overall.  The breast feels much better. No real pain.  Just tenderness and pain in the armpit.    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 Radiation completed 01/11/19. Started tamoxifen 02/07/19. Other HTN, high cholesterol    Patient Stated Goals  decrease breast pain and swelling                       [facility] Adult PT Treatment/Exercise - 07/18/19 0001      Manual Therapy   Manual therapy comments  gave pt info on jovipak mini axilla pad if interested    Edema Management  Pt reports no questions about self MLD, compression and foam padding and will try at home and return in  2 weeks for check in    Soft tissue mobilization  using biotone in sidelying to the latissimus, Rt lateral fibrosis and scapular muscles    Manual Lymphatic Drainage (MLD)  In Supine: Short neck, superficial and deep abdominals, Rt inguinal and Lt axillary nodes, Rt axillo-inguinal and anterior inter-axillary anastomosis, then focused on Rt lateral breast and trunk, then Lt S/L for focus at lateral breast and then further work along Rt axillo-inguinal and at posterior inter-axillary anastomosis, then finished retracing all steps                  PT Long Term Goals - 07/12/19 1131      PT LONG TERM GOAL #1   Title  Pt will be ind with self MLD for the Rt breast and lateral trunk    Status  Achieved      PT LONG TERM GOAL #2   Title  Pt will obtain appropriate compression  bras and foam inserts for edema management      PT LONG TERM GOAL #3   Title  Patient will increase right shoulder abduction to >/= 165 degrees for increased ease reaching overhead.            Plan - 07/18/19 1455    Clinical Impression Statement  Overall no pain around the Rt shoulder blade and no pain in the Rt breast but with some discomfort with pressure at the lateral chest.  Pt continues with possible seroma in the Rt axilla although decreased and muscle tension vs edema lateral chest.  Always softens with manual work.  Pt should be fine with self care at this point but we will check in in 2 weeks to make sure things are going well.    PT Frequency  2x / week    PT Duration  6 weeks    PT Treatment/Interventions  ADLs/Self Care Home Management;Patient/family education;Manual lymph drainage;Taping;Therapeutic exercise;Compression bandaging    PT Next Visit Plan  How does the lateral chest and axilla feel after 2 week break?    Consulted and Agree with Plan of Care  Patient       Patient will benefit from skilled therapeutic intervention in order to improve the following deficits and impairments:     Visit Diagnosis: Lymphedema  Aftercare following surgery for neoplasm  Abnormal posture  Stiffness of right shoulder, not elsewhere classified  Malignant neoplasm of lower-outer quadrant of right breast of female, estrogen receptor positive ([place])     Problem List Patient Active Problem List   Diagnosis Date Noted  . Genetic testing 10/25/2018  . Family history of breast cancer   . Family history of ovarian cancer   . Family history of prostate cancer   . Family history of colon cancer   . Family history of bladder cancer   . Malignant neoplasm of lower-outer quadrant of right breast of female, estrogen receptor positive ([place]) 10/04/2018  . Knee pain, bilateral 03/23/2018  . Atypical chest pain 05/07/2017  . Hyperglycemia 03/28/2017  . Shortness of breath 03/28/2017   . Melanoma of skin ([place]) 08/19/2016  . BCC (basal cell carcinoma) 08/01/2015  . DDD (degenerative disc disease), cervical 08/01/2015  . Medicare annual wellness visit, subsequent 02/09/2015  . Cervical cancer screening 06/06/2012  . Osteopenia 06/06/2012  . Vitamin D deficiency 06/06/2012  . Preventative health care 03/30/2012  . Knee pain 03/30/2012  . Thyroid disease   . GERD (gastroesophageal reflux disease)   . Essential hypertension   . Anemia   .  Allergy   . Hyperlipidemia     Tammy Grimes 07/18/2019, 2:57 PM  [place], [place], [address] Phone: [phone]   Fax:  [phone]  Name: Tammy Grimes MRN: [id number] Date of Birth: [birth year]

## 2019-07-30 ENCOUNTER — Other Ambulatory Visit: Payer: Self-pay

## 2019-07-30 ENCOUNTER — Encounter: Payer: Self-pay | Admitting: Family Medicine

## 2019-07-30 ENCOUNTER — Ambulatory Visit (INDEPENDENT_AMBULATORY_CARE_PROVIDER_SITE_OTHER): Payer: Medicare Other | Admitting: Family Medicine

## 2019-07-30 VITALS — BP 130/72 | HR 91 | Temp 96.9°F | Resp 18 | Wt 131.2 lb

## 2019-07-30 DIAGNOSIS — C50511 Malignant neoplasm of lower-outer quadrant of right female breast: Secondary | ICD-10-CM

## 2019-07-30 DIAGNOSIS — Z170 Estrogen receptor positive status [ER+]: Secondary | ICD-10-CM | POA: Diagnosis not present

## 2019-07-30 DIAGNOSIS — R0789 Other chest pain: Secondary | ICD-10-CM

## 2019-07-30 DIAGNOSIS — E782 Mixed hyperlipidemia: Secondary | ICD-10-CM | POA: Diagnosis not present

## 2019-07-30 DIAGNOSIS — Z23 Encounter for immunization: Secondary | ICD-10-CM | POA: Diagnosis not present

## 2019-07-30 DIAGNOSIS — M8580 Other specified disorders of bone density and structure, unspecified site: Secondary | ICD-10-CM

## 2019-07-30 DIAGNOSIS — I10 Essential (primary) hypertension: Secondary | ICD-10-CM | POA: Diagnosis not present

## 2019-07-30 DIAGNOSIS — E559 Vitamin D deficiency, unspecified: Secondary | ICD-10-CM | POA: Diagnosis not present

## 2019-07-30 DIAGNOSIS — R739 Hyperglycemia, unspecified: Secondary | ICD-10-CM

## 2019-07-30 LAB — CBC
HCT: 38.3 % (ref 36.0–46.0)
Hemoglobin: 12.6 g/dL (ref 12.0–15.0)
MCHC: 33 g/dL (ref 30.0–36.0)
MCV: 93.8 fl (ref 78.0–100.0)
Platelets: 171 10*3/uL (ref 150.0–400.0)
RBC: 4.08 Mil/uL (ref 3.87–5.11)
RDW: 13.6 % (ref 11.5–15.5)
WBC: 7.5 10*3/uL (ref 4.0–10.5)

## 2019-07-30 LAB — HEMOGLOBIN A1C: Hgb A1c MFr Bld: 5.8 % (ref 4.6–6.5)

## 2019-07-30 LAB — COMPREHENSIVE METABOLIC PANEL
ALT: 23 U/L (ref 0–35)
AST: 22 U/L (ref 0–37)
Albumin: 4.2 g/dL (ref 3.5–5.2)
Alkaline Phosphatase: 43 U/L (ref 39–117)
BUN: 13 mg/dL (ref 6–23)
CO2: 27 mEq/L (ref 19–32)
Calcium: 9.2 mg/dL (ref 8.4–10.5)
Chloride: 106 mEq/L (ref 96–112)
Creatinine, Ser: 0.61 mg/dL (ref 0.40–1.20)
GFR: 96.5 mL/min (ref >60.00)
Glucose, Bld: 92 mg/dL (ref 70–99)
Potassium: 4.6 mEq/L (ref 3.5–5.1)
Sodium: 141 mEq/L (ref 135–145)
Total Bilirubin: 0.3 mg/dL (ref 0.2–1.2)
Total Protein: 6.3 g/dL (ref 6.0–8.3)

## 2019-07-30 LAB — LIPID PANEL
Cholesterol: 120 mg/dL (ref 0–200)
HDL: 40.7 mg/dL (ref >39.00)
LDL Cholesterol: 49 mg/dL (ref 0–99)
NonHDL: 79.16
Total CHOL/HDL Ratio: 3
Triglycerides: 149 mg/dL (ref 0.0–149.0)
VLDL: 29.8 mg/dL (ref 0.0–40.0)

## 2019-07-30 LAB — TSH: TSH: 3.29 u[IU]/mL (ref 0.35–4.50)

## 2019-07-30 LAB — VITAMIN D 25 HYDROXY (VIT D DEFICIENCY, FRACTURES): VITD: 104.56 ng/mL (ref 30.00–100.00)

## 2019-07-30 NOTE — Assessment & Plan Note (Signed)
Encouraged heart healthy diet, increase exercise, avoid trans fats, consider a krill oil cap daily 

## 2019-07-30 NOTE — Patient Instructions (Addendum)
Pulse oximeter   Weekly vitals  Multivitamin with minerals especially Selenium and Zinc Hypertension, Adult High blood pressure (hypertension) is when the force of blood pumping through the arteries is too strong. The arteries are the blood vessels that carry blood from the heart throughout the body. Hypertension forces the heart to work harder to pump blood and may cause arteries to become narrow or stiff. Untreated or uncontrolled hypertension can cause a heart attack, heart failure, a stroke, kidney disease, and other problems. A blood pressure reading consists of a higher number over a lower number. Ideally, your blood pressure should be below 120/80. The first ("top") number is called the systolic pressure. It is a measure of the pressure in your arteries as your heart beats. The second ("bottom") number is called the diastolic pressure. It is a measure of the pressure in your arteries as the heart relaxes. What are the causes? The exact cause of this condition is not known. There are some conditions that result in or are related to high blood pressure. What increases the risk? Some risk factors for high blood pressure are under your control. The following factors may make you more likely to develop this condition:  Smoking.  Having type 2 diabetes mellitus, high cholesterol, or both.  Not getting enough exercise or physical activity.  Being overweight.  Having too much fat, sugar, calories, or salt (sodium) in your diet.  Drinking too much alcohol. Some risk factors for high blood pressure may be difficult or impossible to change. Some of these factors include:  Having chronic kidney disease.  Having a family history of high blood pressure.  Age. Risk increases with age.  Race. You may be at higher risk if you are African American.  Gender. Men are at higher risk than women before age 7. After age 40, women are at higher risk than men.  Having obstructive sleep  apnea.  Stress. What are the signs or symptoms? High blood pressure may not cause symptoms. Very high blood pressure (hypertensive crisis) may cause:  Headache.  Anxiety.  Shortness of breath.  Nosebleed.  Nausea and vomiting.  Vision changes.  Severe chest pain.  Seizures. How is this diagnosed? This condition is diagnosed by measuring your blood pressure while you are seated, with your arm resting on a flat surface, your legs uncrossed, and your feet flat on the floor. The cuff of the blood pressure monitor will be placed directly against the skin of your upper arm at the level of your heart. It should be measured at least twice using the same arm. Certain conditions can cause a difference in blood pressure between your right and left arms. Certain factors can cause blood pressure readings to be lower or higher than normal for a short period of time:  When your blood pressure is higher when you are in a health care provider's office than when you are at home, this is called white coat hypertension. Most people with this condition do not need medicines.  When your blood pressure is higher at home than when you are in a health care provider's office, this is called masked hypertension. Most people with this condition may need medicines to control blood pressure. If you have a high blood pressure reading during one visit or you have normal blood pressure with other risk factors, you may be asked to:  Return on a different day to have your blood pressure checked again.  Monitor your blood pressure at home for 1 week  or longer. If you are diagnosed with hypertension, you may have other blood or imaging tests to help your health care provider understand your overall risk for other conditions. How is this treated? This condition is treated by making healthy lifestyle changes, such as eating healthy foods, exercising more, and reducing your alcohol intake. Your health care provider may  prescribe medicine if lifestyle changes are not enough to get your blood pressure under control, and if:  Your systolic blood pressure is above 130.  Your diastolic blood pressure is above 80. Your personal target blood pressure may vary depending on your medical conditions, your age, and other factors. Follow these instructions at home: Eating and drinking   Eat a diet that is high in fiber and potassium, and low in sodium, added sugar, and fat. An example eating plan is called the DASH (Dietary Approaches to Stop Hypertension) diet. To eat this way: ? Eat plenty of fresh fruits and vegetables. Try to fill one half of your plate at each meal with fruits and vegetables. ? Eat whole grains, such as whole-wheat pasta, brown rice, or whole-grain bread. Fill about one fourth of your plate with whole grains. ? Eat or drink low-fat dairy products, such as skim milk or low-fat yogurt. ? Avoid fatty cuts of meat, processed or cured meats, and poultry with skin. Fill about one fourth of your plate with lean proteins, such as fish, chicken without skin, beans, eggs, or tofu. ? Avoid pre-made and processed foods. These tend to be higher in sodium, added sugar, and fat.  Reduce your daily sodium intake. Most people with hypertension should eat less than 1,500 mg of sodium a day.  Do not drink alcohol if: ? Your health care provider tells you not to drink. ? You are pregnant, may be pregnant, or are planning to become pregnant.  If you drink alcohol: ? Limit how much you use to:  0-1 drink a day for women.  0-2 drinks a day for men. ? Be aware of how much alcohol is in your drink. In the [place] one drink equals one 12 oz bottle of beer (355 mL), one 5 oz glass of wine (148 mL), or one 1 oz glass of hard liquor (44 mL). Lifestyle   Work with your health care provider to maintain a healthy body weight or to lose weight. Ask what an ideal weight is for you.  Get at least 30 minutes of exercise  most days of the week. Activities may include walking, swimming, or biking.  Include exercise to strengthen your muscles (resistance exercise), such as Pilates or lifting weights, as part of your weekly exercise routine. Try to do these types of exercises for 30 minutes at least 3 days a week.  Do not use any products that contain nicotine or tobacco, such as cigarettes, e-cigarettes, and chewing tobacco. If you need help quitting, ask your health care provider.  Monitor your blood pressure at home as told by your health care provider.  Keep all follow-up visits as told by your health care provider. This is important. Medicines  Take over-the-counter and prescription medicines only as told by your health care provider. Follow directions carefully. Blood pressure medicines must be taken as prescribed.  Do not skip doses of blood pressure medicine. Doing this puts you at risk for problems and can make the medicine less effective.  Ask your health care provider about side effects or reactions to medicines that you should watch for. Contact a health care  provider if you:  Think you are having a reaction to a medicine you are taking.  Have headaches that keep coming back (recurring).  Feel dizzy.  Have swelling in your ankles.  Have trouble with your vision. Get help right away if you:  Develop a severe headache or confusion.  Have unusual weakness or numbness.  Feel faint.  Have severe pain in your chest or abdomen.  Vomit repeatedly.  Have trouble breathing. Summary  Hypertension is when the force of blood pumping through your arteries is too strong. If this condition is not controlled, it may put you at risk for serious complications.  Your personal target blood pressure may vary depending on your medical conditions, your age, and other factors. For most people, a normal blood pressure is less than 120/80.  Hypertension is treated with lifestyle changes, medicines, or a  combination of both. Lifestyle changes include losing weight, eating a healthy, low-sodium diet, exercising more, and limiting alcohol. This information is not intended to replace advice given to you by your health care provider. Make sure you discuss any questions you have with your health care provider. Document Released: 10/25/2005 Document Revised: 07/05/2018 Document Reviewed: 07/05/2018 Elsevier Patient Education  [place].  Dehydration, Adult  Dehydration is a condition in which there is not enough fluid or water in the body. This happens when you lose more fluids than you take in. Important organs, such as the kidneys, brain, and heart, cannot function without a proper amount of fluids. Any loss of fluids from the body can lead to dehydration. Dehydration can range from mild to severe. This condition should be treated right away to prevent it from becoming severe. What are the causes? This condition may be caused by:  Vomiting.  Diarrhea.  Excessive sweating, such as from heat exposure or exercise.  Not drinking enough fluid, especially: ? When ill. ? While doing activity that requires a lot of energy.  Excessive urination.  Fever.  Infection.  Certain medicines, such as medicines that cause the body to lose excess fluid (diuretics).  Inability to access safe drinking water.  Reduced physical ability to get adequate water and food. What increases the risk? This condition is more likely to develop in people:  Who have a poorly controlled long-term (chronic) illness, such as diabetes, heart disease, or kidney disease.  Who are age 77 or older.  Who are disabled.  Who live in a place with high altitude.  Who play endurance sports. What are the signs or symptoms? Symptoms of mild dehydration may include:  Thirst.  Dry lips.  Slightly dry mouth.  Dry, warm skin.  Dizziness. Symptoms of moderate dehydration may include:  Very dry mouth.  Muscle  cramps.  Dark urine. Urine may be the color of tea.  Decreased urine production.  Decreased tear production.  Heartbeat that is irregular or faster than normal (palpitations).  Headache.  Light-headedness, especially when you stand up from a sitting position.  Fainting (syncope). Symptoms of severe dehydration may include:  Changes in skin, such as: ? Cold and clammy skin. ? Blotchy (mottled) or pale skin. ? Skin that does not quickly return to normal after being lightly pinched and released (poor skin turgor).  Changes in body fluids, such as: ? Extreme thirst. ? No tear production. ? Inability to sweat when body temperature is high, such as in hot weather. ? Very little urine production.  Changes in vital signs, such as: ? Weak pulse. ? Pulse that is  more than 100 beats a minute when sitting still. ? Rapid breathing. ? Low blood pressure.  Other changes, such as: ? Sunken eyes. ? Cold hands and feet. ? Confusion. ? Lack of energy (lethargy). ? Difficulty waking up from sleep. ? Short-term weight loss. ? Unconsciousness. How is this diagnosed? This condition is diagnosed based on your symptoms and a physical exam. Blood and urine tests may be done to help confirm the diagnosis. How is this treated? Treatment for this condition depends on the severity. Mild or moderate dehydration can often be treated at home. Treatment should be started right away. Do not wait until dehydration becomes severe. Severe dehydration is an emergency and it needs to be treated in a hospital. Treatment for mild dehydration may include:  Drinking more fluids.  Replacing salts and minerals in your blood (electrolytes) that you may have lost. Treatment for moderate dehydration may include:  Drinking an oral rehydration solution (ORS). This is a drink that helps you replace fluids and electrolytes (rehydrate). It can be found at pharmacies and retail stores. Treatment for severe  dehydration may include:  Receiving fluids through an IV tube.  Receiving an electrolyte solution through a feeding tube that is passed through your nose and into your stomach (nasogastric tube, or NG tube).  Correcting any abnormalities in electrolytes.  Treating the underlying cause of dehydration. Follow these instructions at home:  If directed by your health care provider, drink an ORS: ? Make an ORS by following instructions on the package. ? Start by drinking small amounts, about  cup (120 mL) every 5-10 minutes. ? Slowly increase how much you drink until you have taken the amount recommended by your health care provider.  Drink enough clear fluid to keep your urine clear or pale yellow. If you were told to drink an ORS, finish the ORS first, then start slowly drinking other clear fluids. Drink fluids such as: ? Water. Do not drink only water. Doing that can lead to having too little salt (sodium) in the body (hyponatremia). ? Ice chips. ? Fruit juice that you have added water to (diluted fruit juice). ? Low-calorie sports drinks.  Avoid: ? Alcohol. ? Drinks that contain a lot of sugar. These include high-calorie sports drinks, fruit juice that is not diluted, and soda. ? Caffeine. ? Foods that are greasy or contain a lot of fat or sugar.  Take over-the-counter and prescription medicines only as told by your health care provider.  Do not take sodium tablets. This can lead to having too much sodium in the body (hypernatremia).  Eat foods that contain a healthy balance of electrolytes, such as bananas, oranges, potatoes, tomatoes, and spinach.  Keep all follow-up visits as told by your health care provider. This is important. Contact a health care provider if:  You have abdominal pain that: ? Gets worse. ? Stays in one area (localizes).  You have a rash.  You have a stiff neck.  You are more irritable than usual.  You are sleepier or more difficult to wake up than  usual.  You feel weak or dizzy.  You feel very thirsty.  You have urinated only a small amount of very dark urine over 6-8 hours. Get help right away if:  You have symptoms of severe dehydration.  You cannot drink fluids without vomiting.  Your symptoms get worse with treatment.  You have a fever.  You have a severe headache.  You have vomiting or diarrhea that: ? Gets worse. ?  Does not go away.  You have blood or green matter (bile) in your vomit.  You have blood in your stool. This may cause stool to look black and tarry.  You have not urinated in 6-8 hours.  You faint.  Your heart rate while sitting still is over 100 beats a minute.  You have trouble breathing. This information is not intended to replace advice given to you by your health care provider. Make sure you discuss any questions you have with your health care provider. Document Released: 10/25/2005 Document Revised: 10/07/2017 Document Reviewed: 12/19/2015 Elsevier Patient Education  2020 [employer].

## 2019-07-30 NOTE — Assessment & Plan Note (Addendum)
Is following with Dr Barry Dienes of surgery and oncology, Dr Jana Hakim. She is on Tamoxifen and it is giving her hot flashes so they tried to take Venlafaxine but is worried about the side effects, will consider if worsens. She hsa struggled with lymphedema in right arm but therapy is helping.

## 2019-07-30 NOTE — Assessment & Plan Note (Addendum)
Supplement and monitor, results show patient with Vitamin D level above normal. Will hold supplementation and recheck in 3 months.

## 2019-07-30 NOTE — Progress Notes (Signed)
Patient advised per Dr. Charlett Blake to dc vitamin D. We will check levels again when she comes back in January.

## 2019-07-30 NOTE — Assessment & Plan Note (Signed)
hgba1c acceptable, minimize simple carbs. Increase exercise as tolerated. Continue current meds 

## 2019-07-30 NOTE — Assessment & Plan Note (Signed)
Well controlled, no changes to meds. Encouraged heart healthy diet such as the DASH diet and exercise as tolerated.  °

## 2019-08-01 NOTE — Assessment & Plan Note (Signed)
No worsening or recent episodes but she does have an appointment with cardiology next month for further evaluation.

## 2019-08-01 NOTE — Progress Notes (Signed)
Patient ID: Tammy Grimes, female   DOB: 20-Dec-1945, 73 y.o.   MRN: [id number]   Subjective:    Patient ID: Tammy Grimes, female    DOB: 1946/10/10, 73 y.o.   MRN: [id number]  No chief complaint on file.   HPI Patient is in today for follow up on chronic medical concerns. She is struggling with some lymphedema on the right after her breast cancer treatments. She is responding some to her therapy. Over all she is doing well. She has an appointment with cardiology next month but is not currently having chest pain. No recent febrile illness or hospitalizations. Denies CP/palp/SOB/HA/congestion/fevers/GI or GU c/o. Taking meds as prescribed  Past Medical History:  Diagnosis Date  . Allergy   . Anemia    prior to hysterectomy  . BCC (basal cell carcinoma) 08/01/2015  . Breast cancer ([place]) 09/25/2018   right  . Cancer ([place]) 2007   calf of right leg- melanoma  . Cataract   . Cervical cancer screening 06/06/2012  . DDD (degenerative disc disease), cervical 08/01/2015  . Family history of bladder cancer   . Family history of breast cancer   . Family history of colon cancer   . Family history of ovarian cancer   . Family history of prostate cancer   . GERD (gastroesophageal reflux disease)   . Hyperglycemia 03/28/2017  . Hyperlipidemia   . Hypertension 65  . Hypothyroidism   . Knee pain 03/30/2012   L>R  . Low back pain 03/23/2018  . Medicare annual wellness visit, subsequent 02/09/2015  . Melanoma of skin ([place]) 08/19/2016  . PONV (postoperative nausea and vomiting)   . Preventative health care 03/30/2012  . Thyroid disease 64  . Vitamin D deficiency 06/06/2012    Past Surgical History:  Procedure Laterality Date  . ABDOMINAL HYSTERECTOMY  1990's   total, for heavy bleeding and fibroids  . BASAL CELL CARCINOMA EXCISION    . BREAST LUMPECTOMY WITH RADIOACTIVE SEED AND SENTINEL LYMPH NODE BIOPSY Right 11/07/2018   Procedure: RIGHT BREAST LUMPECTOMY WITH RADIOACTIVE SEED AND SENTINEL  LYMPH NODE BIOPSY;  Surgeon: [name], MD;  Location: [facility];  Service: General;  Laterality: Right;  . BREAST SURGERY  early 70's   fibroid tumors removed- benign  . CATARACT EXTRACTION Bilateral 12/09/12  . COLONOSCOPY    . EYE SURGERY Bilateral 2015   cataracts  . MELANOMA EXCISION  2007    Family History  Problem Relation Age of Onset  . Hypertension Mother   . Alzheimer's disease Mother   . Dementia Mother        alzheimer's  . Aortic aneurysm Father   . Hypertension Father   . COPD Father        smoker  . Heart disease Father        s/p bypass, aortic aneurysm, carotid artery disease  . Breast cancer Sister 20  . Diabetes Son 22       type 1  . Alzheimer's disease Maternal Grandmother   . Prostate cancer Maternal Grandfather 60       metastatic  . Stroke Paternal Grandfather   . Ovarian cancer Maternal Aunt 75  . Cancer Maternal Aunt        cervical or uterine  . Colon cancer Paternal Uncle 38  . Breast cancer Cousin 58  . Breast cancer Cousin 49  . Colon cancer Cousin        dx >50  . Esophageal cancer Neg Hx   .  Pancreatic cancer Neg Hx   . Rectal cancer Neg Hx   . Stomach cancer Neg Hx     Social History   Socioeconomic History  . Marital status: Married    Spouse name: Not on file  . Number of children: Not on file  . Years of education: Not on file  . Highest education level: Not on file  Occupational History  . Not on file  Social Needs  . Financial resource strain: Not on file  . Food insecurity    Worry: Not on file    Inability: Not on file  . Transportation needs    Medical: No    Non-medical: No  Tobacco Use  . Smoking status: Never Smoker  . Smokeless tobacco: Never Used  Substance and Sexual Activity  . Alcohol use: No  . Drug use: No  . Sexual activity: Yes    Comment: lives with husband, retired from [employer] work, no dietary restrictions  Lifestyle  . Physical activity    Days per week: Not on  file    Minutes per session: Not on file  . Stress: Not on file  Relationships  . Social Herbalist on phone: Not on file    Gets together: Not on file    Attends religious service: Not on file    Active member of club or organization: Not on file    Attends meetings of clubs or organizations: Not on file    Relationship status: Not on file  . Intimate partner violence    Fear of current or ex partner: No    Emotionally abused: No    Physically abused: No    Forced sexual activity: No  Other Topics Concern  . Not on file  Social History Narrative  . Not on file    Outpatient Medications Prior to Visit  Medication Sig Dispense Refill  . amLODipine (NORVASC) 5 MG tablet Take 1 tablet (5 mg total) by mouth daily. 90 tablet 1  . aspirin 81 MG tablet Take 81 mg by mouth daily.    . calcium-vitamin D 250-100 MG-UNIT per tablet Take 1 tablet by mouth daily.    . fluconazole (DIFLUCAN) 100 MG tablet Take 1 tablet (100 mg total) by mouth daily. 10 tablet 0  . KRILL OIL PO Take 1 capsule by mouth daily.    . metoprolol tartrate (LOPRESSOR) 25 MG tablet Take 1/2 tablet (12.5mg  ) by mouth two times a day 90 tablet 1  . nitrofurantoin, macrocrystal-monohydrate, (MACROBID) 100 MG capsule Take 1 capsule (100 mg total) by mouth 2 (two) times daily. 1 po BId 10 capsule 0  . omeprazole (PRILOSEC) 20 MG capsule Take 1 capsule (20 mg total) by mouth daily. 90 capsule 3  . rosuvastatin (CRESTOR) 5 MG tablet TAKE 1 TABLET BY MOUTH EVERY DAY 90 tablet 1  . SYNTHROID 75 MCG tablet TAKE 1 TABLET(75 MCG) BY MOUTH DAILY BEFORE BREAKFAST 90 tablet 3  . Turmeric 500 MG TABS Take 500 mg by mouth daily.    Marland Kitchen venlafaxine XR (EFFEXOR-XR) 37.5 MG 24 hr capsule Take 1 capsule (37.5 mg total) by mouth daily with breakfast. 90 capsule 4   No facility-administered medications prior to visit.     Allergies  Allergen Reactions  . Penicillins Rash    Review of Systems  Constitutional: Positive for  malaise/fatigue. Negative for fever.  HENT: Negative for congestion.   Eyes: Negative for blurred vision.  Respiratory: Negative for shortness of  breath.   Cardiovascular: Negative for chest pain, palpitations and leg swelling.  Gastrointestinal: Negative for abdominal pain, blood in stool and nausea.  Genitourinary: Negative for dysuria and frequency.  Musculoskeletal: Positive for joint pain. Negative for falls.  Skin: Negative for rash.  Neurological: Negative for dizziness, loss of consciousness and headaches.  Endo/Heme/Allergies: Negative for environmental allergies.  Psychiatric/Behavioral: Negative for depression. The patient is not nervous/anxious.        Objective:    Physical Exam Vitals signs and nursing note reviewed.  Constitutional:      General: She is not in acute distress.    Appearance: She is well-developed.  HENT:     Head: Normocephalic and atraumatic.     Nose: Nose normal.  Eyes:     General:        Right eye: No discharge.        Left eye: No discharge.  Neck:     Musculoskeletal: Normal range of motion and neck supple.  Cardiovascular:     Rate and Rhythm: Normal rate and regular rhythm.     Heart sounds: No murmur.  Pulmonary:     Effort: Pulmonary effort is normal.     Breath sounds: Normal breath sounds.  Abdominal:     General: Bowel sounds are normal.     Palpations: Abdomen is soft.     Tenderness: There is no abdominal tenderness.  Skin:    General: Skin is warm and dry.  Neurological:     Mental Status: She is alert and oriented to person, place, and time.     BP 130/72 (BP Location: Left Arm, Patient Position: Sitting, Cuff Size: Normal)   Pulse 91   Temp (!) 96.9 F (36.1 C) (Temporal)   Resp 18   Wt 131 lb 3.2 oz (59.5 kg)   SpO2 96%   BMI 24.00 kg/m  Wt Readings from Last 3 Encounters:  07/30/19 131 lb 3.2 oz (59.5 kg)  05/22/19 134 lb 4.8 oz (60.9 kg)  05/01/19 134 lb 8 oz (61 kg)    Diabetic Foot Exam - Simple    No data filed     Lab Results  Component Value Date   WBC 7.5 07/30/2019   HGB 12.6 07/30/2019   HCT 38.3 07/30/2019   PLT 171.0 07/30/2019   GLUCOSE 92 07/30/2019   CHOL 120 07/30/2019   TRIG 149.0 07/30/2019   HDL 40.70 07/30/2019   LDLDIRECT 152.0 08/01/2015   LDLCALC 49 07/30/2019   ALT 23 07/30/2019   AST 22 07/30/2019   NA 141 07/30/2019   K 4.6 07/30/2019   CL 106 07/30/2019   CREATININE 0.61 07/30/2019   BUN 13 07/30/2019   CO2 27 07/30/2019   TSH 3.29 07/30/2019   HGBA1C 5.8 07/30/2019    Lab Results  Component Value Date   TSH 3.29 07/30/2019   Lab Results  Component Value Date   WBC 7.5 07/30/2019   HGB 12.6 07/30/2019   HCT 38.3 07/30/2019   MCV 93.8 07/30/2019   PLT 171.0 07/30/2019   Lab Results  Component Value Date   NA 141 07/30/2019   K 4.6 07/30/2019   CO2 27 07/30/2019   GLUCOSE 92 07/30/2019   BUN 13 07/30/2019   CREATININE 0.61 07/30/2019   BILITOT 0.3 07/30/2019   ALKPHOS 43 07/30/2019   AST 22 07/30/2019   ALT 23 07/30/2019   PROT 6.3 07/30/2019   ALBUMIN 4.2 07/30/2019   CALCIUM 9.2 07/30/2019   ANIONGAP 12  05/22/2019   GFR 96.50 07/30/2019   Lab Results  Component Value Date   CHOL 120 07/30/2019   Lab Results  Component Value Date   HDL 40.70 07/30/2019   Lab Results  Component Value Date   LDLCALC 49 07/30/2019   Lab Results  Component Value Date   TRIG 149.0 07/30/2019   Lab Results  Component Value Date   CHOLHDL 3 07/30/2019   Lab Results  Component Value Date   HGBA1C 5.8 07/30/2019       Assessment & Plan:   Problem List Items Addressed This Visit    Essential hypertension    Well controlled, no changes to meds. Encouraged heart healthy diet such as the DASH diet and exercise as tolerated.       Relevant Orders   CBC (Completed)   Comprehensive metabolic panel (Completed)   TSH (Completed)   Hyperlipidemia    Encouraged heart healthy diet, increase exercise, avoid trans fats, consider a  krill oil cap daily      Relevant Orders   Lipid panel (Completed)   Osteopenia    Encouraged to get adequate exercise, calcium and vitamin d intake      Vitamin D deficiency    Supplement and monitor, results show patient with Vitamin D level above normal. Will hold supplementation and recheck in 3 months.      Relevant Orders   VITAMIN D 25 Hydroxy (Vit-D Deficiency, Fractures) (Completed)   Hyperglycemia    hgba1c acceptable, minimize simple carbs. Increase exercise as tolerated. Continue current meds      Relevant Orders   Hemoglobin A1c (Completed)   Atypical chest pain    No worsening or recent episodes but she does have an appointment with cardiology next month for further evaluation.       Malignant neoplasm of lower-outer quadrant of right breast of female, estrogen receptor positive ([place])    Is following with Dr Barry Dienes of surgery and oncology, Dr Jana Hakim. She is on Tamoxifen and it is giving her hot flashes so they tried to take Venlafaxine but is worried about the side effects, will consider if worsens. She hsa struggled with lymphedema in right arm but therapy is helping.       Other Visit Diagnoses    Needs flu shot    -  Primary   Relevant Orders   Flu Vaccine QUAD High Dose(Fluad) (Completed)      I am having Tammy Perking. Casstevens "[name]" maintain her aspirin, KRILL OIL PO, calcium-vitamin D, Turmeric, omeprazole, Synthroid, fluconazole, nitrofurantoin (macrocrystal-monohydrate), metoprolol tartrate, amLODipine, rosuvastatin, and venlafaxine XR.  No orders of the defined types were placed in this encounter.    Penni Homans, MD

## 2019-08-01 NOTE — Assessment & Plan Note (Signed)
Encouraged to get adequate exercise, calcium and vitamin d intake 

## 2019-08-08 ENCOUNTER — Other Ambulatory Visit: Payer: Self-pay

## 2019-08-08 ENCOUNTER — Encounter: Payer: Self-pay | Admitting: Rehabilitation

## 2019-08-08 ENCOUNTER — Ambulatory Visit: Payer: Medicare Other | Admitting: Rehabilitation

## 2019-08-08 DIAGNOSIS — C50511 Malignant neoplasm of lower-outer quadrant of right female breast: Secondary | ICD-10-CM

## 2019-08-08 DIAGNOSIS — Z170 Estrogen receptor positive status [ER+]: Secondary | ICD-10-CM | POA: Diagnosis not present

## 2019-08-08 DIAGNOSIS — M25611 Stiffness of right shoulder, not elsewhere classified: Secondary | ICD-10-CM | POA: Diagnosis not present

## 2019-08-08 DIAGNOSIS — Z483 Aftercare following surgery for neoplasm: Secondary | ICD-10-CM | POA: Diagnosis not present

## 2019-08-08 DIAGNOSIS — I890 Lymphedema, not elsewhere classified: Secondary | ICD-10-CM | POA: Diagnosis not present

## 2019-08-08 DIAGNOSIS — R293 Abnormal posture: Secondary | ICD-10-CM

## 2019-08-08 NOTE — Therapy (Signed)
Tammy Grimes, [place], [address] Phone: [phone]   Fax:  [phone]  Physical Therapy Treatment  Patient Details  Name: Tammy Grimes MRN: [id number] Date of Birth: 10/12/1946 Referring Provider (PT): Dr. Barry Dienes   Encounter Date: 08/08/2019  PT End of Session - 08/08/19 1455    Visit Number  7    Number of Visits  9    PT Start Time  1400    PT Stop Time  1440    PT Time Calculation (min)  40 min    Activity Tolerance  Patient tolerated treatment well    Behavior During Therapy  [facility] for tasks assessed/performed       Past Medical History:  Diagnosis Date  . Allergy   . Anemia    prior to hysterectomy  . BCC (basal cell carcinoma) 08/01/2015  . Breast cancer ([place]) 09/25/2018   right  . Cancer ([place]) 2007   calf of right leg- melanoma  . Cataract   . Cervical cancer screening 06/06/2012  . DDD (degenerative disc disease), cervical 08/01/2015  . Family history of bladder cancer   . Family history of breast cancer   . Family history of colon cancer   . Family history of ovarian cancer   . Family history of prostate cancer   . GERD (gastroesophageal reflux disease)   . Hyperglycemia 03/28/2017  . Hyperlipidemia   . Hypertension 65  . Hypothyroidism   . Knee pain 03/30/2012   L>R  . Low back pain 03/23/2018  . Medicare annual wellness visit, subsequent 02/09/2015  . Melanoma of skin ([place]) 08/19/2016  . PONV (postoperative nausea and vomiting)   . Preventative health care 03/30/2012  . Thyroid disease 64  . Vitamin D deficiency 06/06/2012    Past Surgical History:  Procedure Laterality Date  . ABDOMINAL HYSTERECTOMY  1990's   total, for heavy bleeding and fibroids  . BASAL CELL CARCINOMA EXCISION    . BREAST LUMPECTOMY WITH RADIOACTIVE SEED AND SENTINEL LYMPH NODE BIOPSY Right 11/07/2018   Procedure: RIGHT BREAST LUMPECTOMY WITH RADIOACTIVE SEED AND SENTINEL LYMPH NODE BIOPSY;  Surgeon:  [name], MD;  Location: [facility];  Service: General;  Laterality: Right;  . BREAST SURGERY  early 70's   fibroid tumors removed- benign  . CATARACT EXTRACTION Bilateral 12/09/12  . COLONOSCOPY    . EYE SURGERY Bilateral 2015   cataracts  . MELANOMA EXCISION  2007    There were no vitals filed for this visit.  Subjective Assessment - 08/08/19 1400    Subjective  I think I am managing things well.  I am a little bit puffy around the shoulder blade.  I have no questions on the massage.  My compression bra is working well I have 3 of them. I never got the foam thing but still have the information.    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 Radiation completed 01/11/19. Started tamoxifen 02/07/19. Other HTN, high cholesterol    Currently in Pain?  No/denies               Tammy Grimes - 08/08/19 0001    Open a tight or new jar  Mild difficulty    Do heavy household chores (wash walls, wash floors)  No difficulty    Carry a shopping bag or briefcase  No difficulty    Wash your back  No difficulty    Use a knife to cut food  No difficulty    Recreational activities in which you take some force or impact through your arm, shoulder, or hand (golf, hammering, tennis)  Mild difficulty    During the past week, to what extent has your arm, shoulder or hand problem interfered with your normal social activities with family, friends, neighbors, or groups?  Not at all    During the past week, to what extent has your arm, shoulder or hand problem limited your work or other regular daily activities  Not at all    Arm, shoulder, or hand pain.  Mild    Tingling (pins and needles) in your arm, shoulder, or hand  None    Difficulty Sleeping  No difficulty    DASH Score  6.82 %             [facility] Adult PT Treatment/Exercise - 08/08/19 0001      Manual Therapy   Soft tissue mobilization  in  right sidelying STM to the left lateral chest into the breast border with fibrosis here softening with manual work and education to patient to focus on bit more on this area with self MLD and foam use.  Gentle STM to the right periscapular muscles and latissimus insertion using biotone    Manual Lymphatic Drainage (MLD)  stationar circles from right lateral breast towards the left axillary region during STM                  PT Long Term Goals - 08/08/19 1406      PT LONG TERM GOAL #1   Title  Pt will be ind with self MLD for the Rt breast and lateral trunk    Status  Achieved      PT LONG TERM GOAL #2   Title  Pt will obtain appropriate compression bras and foam inserts for edema management    Status  Achieved      PT LONG TERM GOAL #3   Title  Patient will increase right shoulder abduction to >/= 165 degrees for increased ease reaching overhead.    Baseline  166    Status  Achieved      PT LONG TERM GOAL #4   Title  Patient will improve her DASH score to </= 8 for increased right arm function.    Status  Achieved      PT LONG TERM GOAL #5   Title  Patient will erbalize good understanding of lymphedema risk reduction practices.    Status  Achieved            Plan - 08/08/19 1456    Clinical Impression Statement  Pt returns today after missing 2 week check up appointment to report that she is doing very well with her self care.  Feels like no more visits are needed.  Performed some STM today to the area of reported tightness at the right lateral chest and scapula with this eliminated after treatment.  Pt educated on cross body and mermaid stretches.  Pt ready for DC with all goals met.    Consulted and Agree with Plan of Care  Patient       Patient will benefit from skilled therapeutic intervention in order to improve the following deficits and impairments:     Visit Diagnosis: Lymphedema  Aftercare following surgery for neoplasm  Abnormal  posture  Stiffness of right shoulder, not elsewhere classified  Malignant neoplasm of lower-outer quadrant  of right breast of female, estrogen receptor positive ([place])     Problem List Patient Active Problem List   Diagnosis Date Noted  . Genetic testing 10/25/2018  . Family history of breast cancer   . Family history of ovarian cancer   . Family history of prostate cancer   . Family history of colon cancer   . Family history of bladder cancer   . Malignant neoplasm of lower-outer quadrant of right breast of female, estrogen receptor positive ([place]) 10/04/2018  . Knee pain, bilateral 03/23/2018  . Atypical chest pain 05/07/2017  . Hyperglycemia 03/28/2017  . Shortness of breath 03/28/2017  . Melanoma of skin ([place]) 08/19/2016  . BCC (basal cell carcinoma) 08/01/2015  . DDD (degenerative disc disease), cervical 08/01/2015  . Medicare annual wellness visit, subsequent 02/09/2015  . Cervical cancer screening 06/06/2012  . Osteopenia 06/06/2012  . Vitamin D deficiency 06/06/2012  . Preventative health care 03/30/2012  . Knee pain 03/30/2012  . Thyroid disease   . GERD (gastroesophageal reflux disease)   . Essential hypertension   . Anemia   . Allergy   . Hyperlipidemia     Stark Bray 08/08/2019, 2:58 PM  [facility], [place], [address] Phone: [phone]   Fax:  [phone]  Name: Tammy Grimes MRN: [id number] Date of Birth: 1945/11/26  PHYSICAL THERAPY DISCHARGE SUMMARY  Visits from Start of Care: 7  Current functional level related to goals / functional outcomes: See above   Remaining deficits: Right breast and lateral chest tightness and puffiness that is relieved with self care   Education / Equipment: Self MLD, compression bras, info on swell spot, foam inserts, scapular HEP Plan: Patient agrees to discharge.  Patient goals were met. Patient is being discharged due to meeting  the stated rehab goals.  ?????    Shan Levans, PT

## 2019-08-18 NOTE — Progress Notes (Signed)
Cardiology Office Note:    Date:  08/20/2019   ID:  Tammy Grimes, DOB Mar 25, 1946, MRN [id number]  PCP:  [name], MD  Cardiologist:  Previously seen by Dr. Saunders Revel. Electrophysiologist:  None   Referring MD: [name], MD   Chief Complaint: follow-up of atypical chest pain  History of Present Illness:    Tammy Grimes is a 73 y.o. female with a history of hypertension, hyperlipidemia, hypothyroidism, melanoma, breast cancer diagnosed in 09/2018 s/p radiation, and GERD who was previously followed by Dr. Saunders Revel and presents today for follow-up.  Patient first seen by Dr. Saunders Revel in 04/2017 for evaluation of exertional shortness of breath and neck pain concerning for anginal equivalent at the request of PCP. Exercise Myoview stress test was ordered and showed one small defect of moderate severity in the apex location suggestive of mild apical ischemia vs shifting breast attenuation. Study was considered low risk. Lopressor 12.5mg  twice daily was added following stress test. She was seen for follow-up in 05/2017. Aspirin and statin were added for primary prevention at that time. Plan was to follow-up in 6 months but it does not appear that this ever happened.  Recent Labs Reviewed: - 07/2019: Total Cholesterol 120, Triglycerides 149, HDL 40.7, LDL 49, Hemoglobin A1c 5.8, Cr 0.61, K 4.60, TSH 3.29, Hemoglobin 12.6.   Patient presents today for follow-up. She was diagnosed with breast cancer in 09/2018 and finished radiation in 01/2019. She has been doing well since that time. She has follow-up with Oncologist next month. Patient doing well from a cardiac standpoint. She denies any recurrence of chest/neck pain. No shortness of breath, palpitations, lightheadedness, dizziness, orthopnea, PND, or lower extremity edema. She has last about 10 lbs throughout the pandemic. She is note as active as she used to be since her gym was closed but she recently bought a stationary bike. She has no questions  or concerns today. She is looking to get established with a new MD given Dr. Saunders Revel moved to [place].    Past Medical History:  Diagnosis Date  . Allergy   . Anemia    prior to hysterectomy  . BCC (basal cell carcinoma) 08/01/2015  . Breast cancer ([place]) 09/25/2018   right  . Cancer ([place]) 2007   calf of right leg- melanoma  . Cataract   . Cervical cancer screening 06/06/2012  . DDD (degenerative disc disease), cervical 08/01/2015  . Family history of bladder cancer   . Family history of breast cancer   . Family history of colon cancer   . Family history of ovarian cancer   . Family history of prostate cancer   . GERD (gastroesophageal reflux disease)   . Hyperglycemia 03/28/2017  . Hyperlipidemia   . Hypertension 65  . Hypothyroidism   . Knee pain 03/30/2012   L>R  . Low back pain 03/23/2018  . Melanoma of skin ([place]) 08/19/2016  . PONV (postoperative nausea and vomiting)   . Thyroid disease 64  . Vitamin D deficiency 06/06/2012    Past Surgical History:  Procedure Laterality Date  . ABDOMINAL HYSTERECTOMY  1990's   total, for heavy bleeding and fibroids  . BASAL CELL CARCINOMA EXCISION    . BREAST LUMPECTOMY WITH RADIOACTIVE SEED AND SENTINEL LYMPH NODE BIOPSY Right 11/07/2018   Procedure: RIGHT BREAST LUMPECTOMY WITH RADIOACTIVE SEED AND SENTINEL LYMPH NODE BIOPSY;  Surgeon: [name], MD;  Location: [facility];  Service: General;  Laterality: Right;  . BREAST SURGERY  early  70's   fibroid tumors removed- benign  . CATARACT EXTRACTION Bilateral 12/09/12  . COLONOSCOPY    . EYE SURGERY Bilateral 2015   cataracts  . MELANOMA EXCISION  2007    Current Medications: Current Meds  Medication Sig  . amLODipine (NORVASC) 5 MG tablet Take 1 tablet (5 mg total) by mouth daily.  Marland Kitchen aspirin 81 MG tablet Take 81 mg by mouth daily.  . calcium-vitamin D 250-100 MG-UNIT per tablet Take 1 tablet by mouth daily.  . metoprolol tartrate (LOPRESSOR) 25 MG tablet Take 1/2  tablet (12.5mg  ) by mouth two times a day  . Multiple Vitamins-Minerals (CENTRUM SILVER 50+WOMEN) TABS Use as directed  . omeprazole (PRILOSEC) 20 MG capsule Take 1 capsule (20 mg total) by mouth daily.  . rosuvastatin (CRESTOR) 5 MG tablet TAKE 1 TABLET BY MOUTH EVERY DAY  . SYNTHROID 75 MCG tablet TAKE 1 TABLET(75 MCG) BY MOUTH DAILY BEFORE BREAKFAST  . tamoxifen (NOLVADEX) 20 MG tablet Take 20 mg by mouth daily.  Marland Kitchen venlafaxine XR (EFFEXOR-XR) 37.5 MG 24 hr capsule Take 1 capsule (37.5 mg total) by mouth daily with breakfast.     Allergies:   Penicillins   Social History   Socioeconomic History  . Marital status: Married    Spouse name: Not on file  . Number of children: Not on file  . Years of education: Not on file  . Highest education level: Not on file  Occupational History  . Not on file  Social Needs  . Financial resource strain: Not on file  . Food insecurity    Worry: Not on file    Inability: Not on file  . Transportation needs    Medical: No    Non-medical: No  Tobacco Use  . Smoking status: Never Smoker  . Smokeless tobacco: Never Used  Substance and Sexual Activity  . Alcohol use: No  . Drug use: No  . Sexual activity: Yes    Comment: lives with husband, retired from [employer] work, no dietary restrictions  Lifestyle  . Physical activity    Days per week: Not on file    Minutes per session: Not on file  . Stress: Not on file  Relationships  . Social Herbalist on phone: Not on file    Gets together: Not on file    Attends religious service: Not on file    Active member of club or organization: Not on file    Attends meetings of clubs or organizations: Not on file    Relationship status: Not on file  Other Topics Concern  . Not on file  Social History Narrative  . Not on file     Family History: The patient's family history includes Alzheimer's disease in her maternal grandmother and mother; Aortic aneurysm in her father; Breast  cancer (age of onset: 74) in her cousin and cousin; Breast cancer (age of onset: 27) in her sister; COPD in her father; Cancer in her maternal aunt; Colon cancer in her cousin; Colon cancer (age of onset: 47) in her paternal uncle; Dementia in her mother; Diabetes (age of onset: 67) in her son; Heart disease in her father; Hypertension in her father and mother; Ovarian cancer (age of onset: 75) in her maternal aunt; Prostate cancer (age of onset: 59) in her maternal grandfather; Stroke in her paternal grandfather. There is no history of Esophageal cancer, Pancreatic cancer, Rectal cancer, or Stomach cancer.  ROS:   Please see the history  of present illness.    All other systems reviewed and are negative.  EKGs/Labs/Other Studies Reviewed:    The following studies were reviewed today:  Exercise Myoview 05/16/2017:  Nuclear stress EF: 74%.  Blood pressure demonstrated a normal response to exercise.  There was no ST segment deviation noted during stress.  Defect 1: There is a small defect of moderate severity present in the apex location.  This is a low risk study.  The left ventricular ejection fraction is hyperdynamic (>65%).   Abnormal, low risk stress nuclear study with mild apical ischemia vs shifting breast attenuation; EF 74 with normal wall motion.   EKG:  EKG ordered today. EKG was personally reviewed and demonstrates normal sinus rhythm, rate 70 bpm, with  Q waves in lead III, low voltage QRS in anterior leads, and non-specific ST/T changes. No acute changes compared to prior tracing.  Recent Labs: 07/30/2019: ALT 23; BUN 13; Creatinine, Ser 0.61; Hemoglobin 12.6; Platelets 171.0; Potassium 4.6; Sodium 141; TSH 3.29  Recent Lipid Panel    Component Value Date/Time   CHOL 120 07/30/2019 1119   CHOL 123 08/22/2017 0814   TRIG 149.0 07/30/2019 1119   HDL 40.70 07/30/2019 1119   HDL 42 08/22/2017 0814   CHOLHDL 3 07/30/2019 1119   VLDL 29.8 07/30/2019 1119   LDLCALC 49  07/30/2019 1119   LDLCALC 60 08/22/2017 0814   LDLDIRECT 152.0 08/01/2015 1013    Physical Exam:    Vital Signs:  BP 130/68   Pulse 70   Ht 5\' 2"  (1.575 m)   Wt 131 lb 1.9 oz (59.5 kg)   SpO2 98%   BMI 23.98 kg/m     Wt Readings from Last 3 Encounters:  08/20/19 131 lb 1.9 oz (59.5 kg)  07/30/19 131 lb 3.2 oz (59.5 kg)  05/22/19 134 lb 4.8 oz (60.9 kg)    General: 73 y.o. female  in no acute distress. HEENT: Normocephalic and atraumatic. Sclera clear. Neck: Supple. No carotid bruits. No JVD. Heart: RRR. Distinct S1 and S2. No murmurs, gallops, or rubs. Radial and distal pedal pulses 2+ and equal bilaterally. Lungs: No increased work of breathing. Clear to ausculation bilaterally. No wheezes, rhonchi, or rales.  Abdomen: Soft, non-distended, and non-tender to palpation. Bowel sounds present in all 4 quadrants.  MSK: Normal strength and tone for age. Extremities: No clubbing, cyanosis, or edema.    Skin: Warm and dry. Neuro: Alert and oriented x3. No focal deficits. Psych: Normal affect. Responds appropriately.  ASSESSMENT:    1. History of chest pain   2. Essential hypertension   3. Hyperlipidemia, unspecified hyperlipidemia type   4. Hypothyroidism, unspecified type    PLAN:    History of Atypical Chest Pain - History of atypical chest pain/neck pain in 2018. She had low risk stress test at that time.  - Stable. No recurrent chest/neck pain. - EKG shows no acute ST/T changes. - Continue Aspirin 81mg  daily, Crestor 5mg  daily, and Lopressor 12.5mg  twice daily. - Encouraged patient to continue to stay active and practice heart healthy diet.   Hypertension - BP well controlled at 130/68. Systolic BP often in the [id number] to 130's at home but can be as low as the 100's. - Continue Amlodipine 5mg  daily and Lopressor 12.5mg  twice daily.   Hyperlipidemia - Recent lipid panel from 07/30/2019: Total Cholesterol 120, Triglycerides 149, HDL 40.7, LDL 49. - Continue Crestor 5mg   daily. - Labs followed by PCP.  Hypothyroidism - Recent TSH 3.29. - Continue  Synthroid. - Managed by PCP.  Disposition: Follow-up with MD in 1 year. Patient previously seen by Dr. Saunders Revel but will transition care to Dr. Harrington Challenger given Dr. Saunders Revel is now in [place].   Medication Adjustments/Labs and Tests Ordered: Current medicines are reviewed at length with the patient today.  Concerns regarding medicines are outlined above.  Orders Placed This Encounter  Procedures  . EKG 12-Lead   No orders of the defined types were placed in this encounter.   Patient Instructions  Medication Instructions:  Your physician recommends that you continue on your current medications as directed. Please refer to the Current Medication list given to you today.  If you need a refill on your cardiac medications before your next appointment, please call your pharmacy.   Lab work: None ordered  If you have labs (blood work) drawn today and your tests are completely normal, you will receive your results only by: Marland Kitchen MyChart Message (if you have MyChart) OR . A paper copy in the mail If you have any lab test that is abnormal or we need to change your treatment, we will call you to review the results.  Testing/Procedures: None ordered  Follow-Up: At [facility], you and your health needs are our priority.  As part of our continuing mission to provide you with exceptional heart care, we have created designated Provider Care Teams.  These Care Teams include your primary Cardiologist (physician) and Advanced Practice Providers (APPs -  Physician Assistants and Nurse Practitioners) who all work together to provide you with the care you need, when you need it. You will need a follow up appointment in:  12 months.  Please call our office 2 months in advance to schedule this appointment.  You may see Dr. Harrington Challenger or one of the following Advanced Practice Providers on your designated Care Team: Richardson Dopp, PA-C [place], [place] . Daune Perch, NP  Any Other Special Instructions Will Be Listed Below (If Applicable).       Signed, Darreld Mclean, PA-C  08/20/2019 11:16 AM    [place]

## 2019-08-19 ENCOUNTER — Encounter: Payer: Self-pay | Admitting: Physician Assistant

## 2019-08-20 ENCOUNTER — Encounter: Payer: Self-pay | Admitting: Student

## 2019-08-20 ENCOUNTER — Ambulatory Visit (INDEPENDENT_AMBULATORY_CARE_PROVIDER_SITE_OTHER): Payer: Medicare Other | Admitting: Student

## 2019-08-20 ENCOUNTER — Other Ambulatory Visit: Payer: Self-pay

## 2019-08-20 ENCOUNTER — Telehealth: Payer: Self-pay | Admitting: Oncology

## 2019-08-20 VITALS — BP 130/68 | HR 70 | Ht 62.0 in | Wt 131.1 lb

## 2019-08-20 DIAGNOSIS — I10 Essential (primary) hypertension: Secondary | ICD-10-CM

## 2019-08-20 DIAGNOSIS — Z87898 Personal history of other specified conditions: Secondary | ICD-10-CM

## 2019-08-20 DIAGNOSIS — E785 Hyperlipidemia, unspecified: Secondary | ICD-10-CM

## 2019-08-20 DIAGNOSIS — E039 Hypothyroidism, unspecified: Secondary | ICD-10-CM | POA: Diagnosis not present

## 2019-08-20 NOTE — Telephone Encounter (Signed)
Called pt per 10/8 sch message - pt request to reschedule appt for 10/10 - no answer and no vmail to leave message.

## 2019-08-20 NOTE — Patient Instructions (Signed)
Medication Instructions:  Your physician recommends that you continue on your current medications as directed. Please refer to the Current Medication list given to you today.  If you need a refill on your cardiac medications before your next appointment, please call your pharmacy.   Lab work: None ordered  If you have labs (blood work) drawn today and your tests are completely normal, you will receive your results only by: Marland Kitchen MyChart Message (if you have MyChart) OR . A paper copy in the mail If you have any lab test that is abnormal or we need to change your treatment, we will call you to review the results.  Testing/Procedures: None ordered  Follow-Up: At [facility], you and your health needs are our priority.  As part of our continuing mission to provide you with exceptional heart care, we have created designated Provider Care Teams.  These Care Teams include your primary Cardiologist (physician) and Advanced Practice Providers (APPs -  Physician Assistants and Nurse Practitioners) who all work together to provide you with the care you need, when you need it. You will need a follow up appointment in:  12 months.  Please call our office 2 months in advance to schedule this appointment.  You may see Dr. Harrington Challenger or one of the following Advanced Practice Providers on your designated Care Team: Richardson Dopp, PA-C [place], [place] . Daune Perch, NP  Any Other Special Instructions Will Be Listed Below (If Applicable).

## 2019-09-13 DIAGNOSIS — Z853 Personal history of malignant neoplasm of breast: Secondary | ICD-10-CM | POA: Diagnosis not present

## 2019-09-17 NOTE — Progress Notes (Signed)
[facility]  [phone] [phone]    ID: Tammy Grimes DOB: 01/11/1946  MR#: [id number]  PYK#:[id number]  Patient Care Team: [name], MD as PCP - General (Family Medicine) Martinique, Amy, MD as Consulting Physician (Dermatology) Rutherford Guys, MD as Consulting Physician (Ophthalmology) [name], MD as Consulting Physician (General Surgery) Danique Hartsough, Virgie Dad, MD as Consulting Physician (Oncology) Kyung Rudd, MD as Consulting Physician (Radiation Oncology) Gatha Mayer, MD as Consulting Physician (Gastroenterology) Mauro Kaufmann, RN as Oncology Nurse Navigator Rockwell Germany, RN as Oncology Nurse Navigator OTHER MD: Dr. Purcell Nails, Dr. Sharyon Medicus DDS PA   CHIEF COMPLAINT: Estrogen receptor positive breast cancer  CURRENT TREATMENT: Tamoxifen   INTERVAL HISTORY: Tammy Grimes returns today for follow-up of her estrogen receptor positive breast cancer.   She continues on tamoxifen.  She generally tolerates this without any significant side effects other than hot flashes.  These have improved sufficiently that she does not think she needs anything for them.  Specifically she never started the Effexor we prescribed.  She had her annual mammography at [facility] 09[facility]13/2019.  This showed the breast density category C.  There were new posttreatment changes in the right breast but no evidence of malignancy.   REVIEW OF SYSTEMS: Tammy Grimes is staying at home and waiting of the pandemic she says.  They have been to [place] house a couple of times, near [facility], and she is planning to visit her sister in [place] who recently bought a house.  For exercise she is doing stretches in the morning.  Rarely she takes a hike.  Aside from these issues a detailed review of systems today was stable    HISTORY OF CURRENT ILLNESS: From the original intake note:  "Tammy Grimes" had routine screening mammography on 09/11/2018 showing a possible abnormality  in the right breast. She underwent right unilateral diagnostic mammography with tomography and right breast ultrasonography at [facility] on 09/21/2018 showing: Breast Density Category C. 1.3 cm irregular focal asymmetry in the right breast lower inner aspect anterior depth 4 cm from the nipple noted on the mammogram, but on the ultrasound this measured at [2.8] cm. The irregular mass is hypoechoic with posterior acoustic shadowing. Color flow imaging demonstrates increased vascularity. Elastography imaging assessment is hard. No significant abnormalities were seen in the right axilla.   Accordingly on 09/28/2018 she proceeded to biopsy of the right breast area in question. The pathology from this procedure showed ([id number]): invasive ductal carcinoma, Grade III. Prognostic indicators significant for: estrogen receptor, 95% positive and progesterone receptor, 95% positive, both with strong staining intensity. Proliferation marker Ki67 at 15%. HER2 negative immunohistochemcal and morphometric analysis 1+.  The patient's subsequent history is as detailed below.   PAST MEDICAL HISTORY: Past Medical History:  Diagnosis Date  . Allergy   . Anemia    prior to hysterectomy  . BCC (basal cell carcinoma) 08/01/2015  . Breast cancer ([place]) 09/25/2018   right  . Cancer ([place]) 2007   calf of right leg- melanoma  . Cataract   . Cervical cancer screening 06/06/2012  . DDD (degenerative disc disease), cervical 08/01/2015  . Family history of bladder cancer   . Family history of breast cancer   . Family history of colon cancer   . Family history of ovarian cancer   . Family history of prostate cancer   . GERD (gastroesophageal reflux disease)   . Hyperglycemia 03/28/2017  . Hyperlipidemia   . Hypertension 65  .  Hypothyroidism   . Knee pain 03/30/2012   L>R  . Low back pain 03/23/2018  . Melanoma of skin ([place]) 08/19/2016  . PONV (postoperative nausea and vomiting)   . Thyroid disease 64  . Vitamin D  deficiency 06/06/2012     PAST SURGICAL HISTORY: Past Surgical History:  Procedure Laterality Date  . ABDOMINAL HYSTERECTOMY  1990's   total, for heavy bleeding and fibroids  . BASAL CELL CARCINOMA EXCISION    . BREAST LUMPECTOMY WITH RADIOACTIVE SEED AND SENTINEL LYMPH NODE BIOPSY Right 11/07/2018   Procedure: RIGHT BREAST LUMPECTOMY WITH RADIOACTIVE SEED AND SENTINEL LYMPH NODE BIOPSY;  Surgeon: [name], MD;  Location: [facility];  Service: General;  Laterality: Right;  . BREAST SURGERY  early 70's   fibroid tumors removed- benign  . CATARACT EXTRACTION Bilateral 12/09/12  . COLONOSCOPY    . EYE SURGERY Bilateral 2015   cataracts  . MELANOMA EXCISION  2007     FAMILY HISTORY: Family History  Problem Relation Age of Onset  . Hypertension Mother   . Alzheimer's disease Mother   . Dementia Mother        alzheimer's  . Aortic aneurysm Father   . Hypertension Father   . COPD Father        smoker  . Heart disease Father        s/p bypass, aortic aneurysm, carotid artery disease  . Breast cancer Sister 23  . Diabetes Son 22       type 1  . Alzheimer's disease Maternal Grandmother   . Prostate cancer Maternal Grandfather 60       metastatic  . Stroke Paternal Grandfather   . Ovarian cancer Maternal Aunt 75  . Cancer Maternal Aunt        cervical or uterine  . Colon cancer Paternal Uncle 52  . Breast cancer Cousin 4  . Breast cancer Cousin 64  . Colon cancer Cousin        dx >50  . Esophageal cancer Neg Hx   . Pancreatic cancer Neg Hx   . Rectal cancer Neg Hx   . Stomach cancer Neg Hx    Her father died from COPD at age 25. Patients' mother died from alzheimer's at age 21. The patient has no brothers, 1 sister. Her sister, [name], had breast cancer at the age of 49.  [name], is employed with the [employer].One of their maternal aunts had ovarian cancer, and a second maternal aunt had either uterine or cervical cancer. Tammy Grimes had  Melanoma in situ on her right calf.   GYNECOLOGIC HISTORY:  Menarche: 73 years old Age at first live birth: 73 years old GX P: 1 LMP: No LMP recorded. Patient has had a hysterectomy. 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 Her husband, Yvone Neu, worked as a English as a second language teacher for an Retail buyer.  He has now retired.  Tammy Grimes has one son, Careers information officer, age 80, who lives in [place] and is a Advertising copywriter as well as a Art gallery manager.     ADVANCED DIRECTIVES: Her husband, Yvone Neu is Forensic scientist (MPOA). If Yvone Neu is unable, then her son, Truddie Crumble, would assume MPOA.    HEALTH  MAINTENANCE: Social History   Tobacco Use  . Smoking status: Never Smoker  . Smokeless tobacco: Never Used  Substance Use Topics  . Alcohol use: No  . Drug use: No    Colonoscopy: yes, 06/07/2017  PAP:   Bone density: yes, 07/25/2018   Allergies  Allergen Reactions  . Penicillins Rash    Current Outpatient Medications  Medication Sig Dispense Refill  . amLODipine (NORVASC) 5 MG tablet Take 1 tablet (5 mg total) by mouth daily. 90 tablet 1  . aspirin 81 MG tablet Take 81 mg by mouth daily.    . calcium-vitamin D 250-100 MG-UNIT per tablet Take 1 tablet by mouth daily.    . metoprolol tartrate (LOPRESSOR) 25 MG tablet Take 1/2 tablet (12.40m ) by mouth two times a day 90 tablet 1  . Multiple Vitamins-Minerals (CENTRUM SILVER 50+WOMEN) TABS Use as directed    . omeprazole (PRILOSEC) 20 MG capsule Take 1 capsule (20 mg total) by mouth daily. 90 capsule 3  . rosuvastatin (CRESTOR) 5 MG tablet TAKE 1 TABLET BY MOUTH EVERY DAY 90 tablet 1  . SYNTHROID 75 MCG tablet TAKE 1 TABLET(75 MCG) BY MOUTH DAILY BEFORE  BREAKFAST 90 tablet 3  . tamoxifen (NOLVADEX) 20 MG tablet Take 20 mg by mouth daily.    .Marland Kitchenvenlafaxine XR (EFFEXOR-XR) 37.5 MG 24 hr capsule Take 1 capsule (37.5 mg total) by mouth daily with breakfast. 90 capsule 4   No current facility-administered medications for this visit.     OBJECTIVE: Middle-aged white woman in no acute distress  Vitals:   09/18/19 1401  BP: (!) 145/76  Pulse: 82  Resp: 16  Temp: 98.3 F (36.8 C)  SpO2: 97%     Body mass index is 24.58 kg/m.   Wt Readings from Last 3 Encounters:  09/18/19 134 lb 6.4 oz (61 kg)  08/20/19 131 lb 1.9 oz (59.5 kg)  07/30/19 131 lb 3.2 oz (59.5 kg)      ECOG FS:1 - Symptomatic but completely ambulatory  Sclerae unicteric, EOMs intact Wearing a mask No cervical or supraclavicular adenopathy Lungs no rales or rhonchi Heart regular rate and rhythm Abd soft, nontender, positive bowel sounds MSK no focal spinal tenderness, no upper extremity lymphedema Neuro: nonfocal, well oriented, appropriate affect Breasts: On the right she is status post lumpectomy followed by radiation.  There continues to be a minimal blush over the breast, but this is fading.  There is no evidence of disease recurrence.  The left breast is benign.  Both axillae are benign.    LAB RESULTS:  CMP     Component Value Date/Time   NA 141 07/30/2019 1119   K 4.6 07/30/2019 1119   CL 106 07/30/2019 1119   CO2 27 07/30/2019 1119   GLUCOSE 92 07/30/2019 1119   BUN 13 07/30/2019 1119   CREATININE 0.61 07/30/2019 1119   CREATININE 0.72 05/22/2019 1213   CREATININE 0.56 07/24/2013 1200   CALCIUM 9.2 07/30/2019 1119   PROT 6.3 07/30/2019 1119   ALBUMIN 4.2 07/30/2019 1119   AST 22 07/30/2019 1119   AST 21 05/22/2019 1213   ALT 23 07/30/2019 1119   ALT 23 05/22/2019 1213   ALKPHOS 43 07/30/2019 1119   BILITOT 0.3 07/30/2019 1119   BILITOT 0.6 05/22/2019 1213   GFRNONAA >60 05/22/2019 1213   GFRAA >60 05/22/2019 1213    No results found for:  TOTALPROTELP, ALBUMINELP, A1GS, A2GS, BETS, BETA2SER, GAMS, MSPIKE, SPEI  No results found for: KPAFRELGTCHN, LAMBDASER,  [facility]  Lab Results  Component Value Date   WBC 6.4 09/18/2019   NEUTROABS 4.2 09/18/2019   HGB 12.2 09/18/2019   HCT 37.8 09/18/2019   MCV 97.4 09/18/2019   PLT 168 09/18/2019    @LASTCHEMISTRY @  No results found for: LABCA2  No components found for: XNATFT732  No results for input(s): INR in the last 168 hours.  No results found for: LABCA2  No results found for: KGU542  No results found for: HCW237  No results found for: SEG315  No results found for: CA2729  No components found for: HGQUANT  No results found for: CEA1 / No results found for: CEA1   No results found for: AFPTUMOR  No results found for: CHROMOGRNA  No results found for: PSA1  Appointment on 09/18/2019  Component Date Value Ref Range Status  . WBC 09/18/2019 6.4  4.0 - 10.5 K/uL Final  . RBC 09/18/2019 3.88  3.87 - 5.11 MIL/uL Final  . Hemoglobin 09/18/2019 12.2  12.0 - 15.0 g/dL Final  . HCT 09/18/2019 37.8  36.0 - 46.0 % Final  . MCV 09/18/2019 97.4  80.0 - 100.0 fL Final  . MCH 09/18/2019 31.4  26.0 - 34.0 pg Final  . MCHC 09/18/2019 32.3  30.0 - 36.0 g/dL Final  . RDW 09/18/2019 12.9  11.5 - 15.5 % Final  . Platelets 09/18/2019 168  150 - 400 K/uL Final  . nRBC 09/18/2019 0.0  0.0 - 0.2 % Final  . Neutrophils Relative % 09/18/2019 65  % Final  . Neutro Abs 09/18/2019 4.2  1.7 - 7.7 K/uL Final  . Lymphocytes Relative 09/18/2019 22  % Final  . Lymphs Abs 09/18/2019 1.4  0.7 - 4.0 K/uL Final  . Monocytes Relative 09/18/2019 9  % Final  . Monocytes Absolute 09/18/2019 0.6  0.1 - 1.0 K/uL Final  . Eosinophils Relative 09/18/2019 3  % Final  . Eosinophils Absolute 09/18/2019 0.2  0.0 - 0.5 K/uL Final  . Basophils Relative 09/18/2019 1  % Final  . Basophils Absolute 09/18/2019 0.0  0.0 - 0.1 K/uL Final  . Immature Granulocytes 09/18/2019 0  % Final  . Abs Immature  Granulocytes 09/18/2019 0.02  0.00 - 0.07 K/uL Final   Performed at [facility], [place] [address], [place], [place] [address]    (this displays the last labs from the last 3 days)  No results found for: TOTALPROTELP, ALBUMINELP, A1GS, A2GS, BETS, BETA2SER, GAMS, MSPIKE, SPEI (this displays SPEP labs)  No results found for: KPAFRELGTCHN, LAMBDASER, KAPLAMBRATIO (kappa/lambda light chains)  No results found for: HGBA, HGBA2QUANT, HGBFQUANT, HGBSQUAN (Hemoglobinopathy evaluation)   No results found for: LDH  No results found for: IRON, TIBC, IRONPCTSAT (Iron and TIBC)  No results found for: FERRITIN  Urinalysis No results found for: COLORURINE, APPEARANCEUR, LABSPEC, PHURINE, GLUCOSEU, HGBUR, BILIRUBINUR, KETONESUR, PROTEINUR, UROBILINOGEN, NITRITE, LEUKOCYTESUR   STUDIES:  Genetics and Oncotype results discussed with the patient   ELIGIBLE FOR AVAILABLE RESEARCH PROTOCOL: no   ASSESSMENT: 73 y.o. [place] woman status post right breast lower inner quadrant biopsy 09/27/2018 for a clinical T1c N0 invasive ductal carcinoma, grade 3, estrogen and progesterone receptor positive, HER-2 not amplified, with an MIB-1 of 15%  (1) Genetic testing performed through [facility] Common Hereditary Cancers panel + Melanoma panel on 10/11/2018 showed no pathogenic mutations in APC, ATM, AXIN2, BAP1, BARD1, BMPR1A, BRCA1, BRCA2, BRIP1, BUB1B, CDH1, CDK4, CDKN2A, CHEK2, CTNNA1, DICER1, ENG, EPCAM, GALNT12, GREM1, HOXB13, KIT, MEN1, MITF, MLH1,  MLH3, MSH2, MSH3, MSH6, MUTYH, NBN, NF1, NTHL1, PALB2, PDGFRA, PMS2, POLD1, POLE, POT1 PTEN, RAD50, RAD51C, RAD51D, RB1, RNF43, RPS20, SDHA, SDHB, SDHC, SDHD, SMAD4, SMARCA4, STK11, TP53, TSC1, TSC2, VHL.  (2) status post right lumpectomy and sentinel lymph node sampling 11/07/2018 for a pT2 pN0, stage IIA, grade 3 with a positive anterior margin  (a) a total of 7 lymph nodes were removed  (3) The Oncotype DX score was  10 predicting a risk of outside the breast recurrence over the next 9 years of 3% if the patient's only systemic therapy is tamoxifen for 5 years. It also predicts <1% benefit from chemotherapy.  (4) adjuvant radiation 12/14/2018 - 01/11/2019  (a) Right breast / 42.56 Gy in 16 fractions  (b) Seroma boost / 8 Gy in 4 fractions  (5) started tamoxifen 02/07/2019  (a) DEXA scan at [facility] 07/25/2018 showed a T score of -2.0   PLAN: Tammy Grimes is just about a year out from definitive surgery for her breast cancer with no evidence of disease recurrence.  She is tolerating tamoxifen well, and never did start the venlafaxine.  She feels that she will have no difficulty continuing this drug for 5 years  We discussed her osteopenia.  At this point vitamin D and walking is what she would like to do to try to improve that number.  At this point I feel comfortable seeing her on a once a year basis.  That is also her preference.  Accordingly she will see me again a year from now, after her [place] mammography 2021.  She knows to call for any problem that may develop before her next visit.   Rosalynn Sergent, Virgie Dad, MD  09/18/19 2:11 PM Medical Oncology and Hematology [facility] [place], [place] [address] Tel. [phone]    Fax. [phone]    I, Wilburn Mylar, am acting as scribe for Dr. Virgie Dad. Madolin Twaddle.  I, Lurline Del MD, have reviewed the above documentation for accuracy and completeness, and I agree with the above.

## 2019-09-18 ENCOUNTER — Inpatient Hospital Stay: Payer: Medicare Other

## 2019-09-18 ENCOUNTER — Inpatient Hospital Stay: Payer: Medicare Other | Attending: Oncology | Admitting: Oncology

## 2019-09-18 ENCOUNTER — Other Ambulatory Visit: Payer: Self-pay

## 2019-09-18 VITALS — BP 145/76 | HR 82 | Temp 98.3°F | Resp 16 | Ht 62.0 in | Wt 134.4 lb

## 2019-09-18 DIAGNOSIS — D649 Anemia, unspecified: Secondary | ICD-10-CM | POA: Diagnosis not present

## 2019-09-18 DIAGNOSIS — E785 Hyperlipidemia, unspecified: Secondary | ICD-10-CM | POA: Diagnosis not present

## 2019-09-18 DIAGNOSIS — E039 Hypothyroidism, unspecified: Secondary | ICD-10-CM | POA: Insufficient documentation

## 2019-09-18 DIAGNOSIS — Z8051 Family history of malignant neoplasm of kidney: Secondary | ICD-10-CM | POA: Diagnosis not present

## 2019-09-18 DIAGNOSIS — Z8042 Family history of malignant neoplasm of prostate: Secondary | ICD-10-CM | POA: Diagnosis not present

## 2019-09-18 DIAGNOSIS — Z79810 Long term (current) use of selective estrogen receptor modulators (SERMs): Secondary | ICD-10-CM | POA: Insufficient documentation

## 2019-09-18 DIAGNOSIS — R232 Flushing: Secondary | ICD-10-CM | POA: Insufficient documentation

## 2019-09-18 DIAGNOSIS — C50511 Malignant neoplasm of lower-outer quadrant of right female breast: Secondary | ICD-10-CM | POA: Diagnosis not present

## 2019-09-18 DIAGNOSIS — Z800 Family history of malignant neoplasm of digestive organs: Secondary | ICD-10-CM | POA: Diagnosis not present

## 2019-09-18 DIAGNOSIS — Z79899 Other long term (current) drug therapy: Secondary | ICD-10-CM | POA: Diagnosis not present

## 2019-09-18 DIAGNOSIS — Z803 Family history of malignant neoplasm of breast: Secondary | ICD-10-CM | POA: Insufficient documentation

## 2019-09-18 DIAGNOSIS — M8580 Other specified disorders of bone density and structure, unspecified site: Secondary | ICD-10-CM | POA: Diagnosis not present

## 2019-09-18 DIAGNOSIS — E559 Vitamin D deficiency, unspecified: Secondary | ICD-10-CM | POA: Insufficient documentation

## 2019-09-18 DIAGNOSIS — I10 Essential (primary) hypertension: Secondary | ICD-10-CM | POA: Insufficient documentation

## 2019-09-18 DIAGNOSIS — R739 Hyperglycemia, unspecified: Secondary | ICD-10-CM | POA: Insufficient documentation

## 2019-09-18 DIAGNOSIS — C50211 Malignant neoplasm of upper-inner quadrant of right female breast: Secondary | ICD-10-CM | POA: Insufficient documentation

## 2019-09-18 DIAGNOSIS — Z170 Estrogen receptor positive status [ER+]: Secondary | ICD-10-CM | POA: Diagnosis not present

## 2019-09-18 DIAGNOSIS — Z7982 Long term (current) use of aspirin: Secondary | ICD-10-CM | POA: Insufficient documentation

## 2019-09-18 DIAGNOSIS — Z8041 Family history of malignant neoplasm of ovary: Secondary | ICD-10-CM | POA: Insufficient documentation

## 2019-09-18 LAB — COMPREHENSIVE METABOLIC PANEL
ALT: 37 U/L (ref 0–44)
AST: 27 U/L (ref 15–41)
Albumin: 4 g/dL (ref 3.5–5.0)
Alkaline Phosphatase: 50 U/L (ref 38–126)
Anion gap: 11 (ref 5–15)
BUN: 12 mg/dL (ref 8–23)
CO2: 26 mmol/L (ref 22–32)
Calcium: 9 mg/dL (ref 8.9–10.3)
Chloride: 106 mmol/L (ref 98–111)
Creatinine, Ser: 0.72 mg/dL (ref 0.44–1.00)
GFR calc Af Amer: >60 mL/min (ref >60)
GFR calc non Af Amer: >60 mL/min (ref >60)
Glucose, Bld: 97 mg/dL (ref 70–99)
Potassium: 4.1 mmol/L (ref 3.5–5.1)
Sodium: 143 mmol/L (ref 135–145)
Total Bilirubin: 0.4 mg/dL (ref 0.3–1.2)
Total Protein: 6.7 g/dL (ref 6.5–8.1)

## 2019-09-18 LAB — CBC WITH DIFFERENTIAL/PLATELET
Abs Immature Granulocytes: 0.02 10*3/uL (ref 0.00–0.07)
Basophils Absolute: 0 10*3/uL (ref 0.0–0.1)
Basophils Relative: 1 %
Eosinophils Absolute: 0.2 10*3/uL (ref 0.0–0.5)
Eosinophils Relative: 3 %
HCT: 37.8 % (ref 36.0–46.0)
Hemoglobin: 12.2 g/dL (ref 12.0–15.0)
Immature Granulocytes: 0 %
Lymphocytes Relative: 22 %
Lymphs Abs: 1.4 10*3/uL (ref 0.7–4.0)
MCH: 31.4 pg (ref 26.0–34.0)
MCHC: 32.3 g/dL (ref 30.0–36.0)
MCV: 97.4 fL (ref 80.0–100.0)
Monocytes Absolute: 0.6 10*3/uL (ref 0.1–1.0)
Monocytes Relative: 9 %
Neutro Abs: 4.2 10*3/uL (ref 1.7–7.7)
Neutrophils Relative %: 65 %
Platelets: 168 10*3/uL (ref 150–400)
RBC: 3.88 MIL/uL (ref 3.87–5.11)
RDW: 12.9 % (ref 11.5–15.5)
WBC: 6.4 10*3/uL (ref 4.0–10.5)
nRBC: 0 % (ref 0.0–0.2)

## 2019-09-18 MED ORDER — TAMOXIFEN CITRATE 20 MG PO TABS: 20.0000 mg | ORAL_TABLET | Freq: Every day | ORAL | 4 refills | Status: DC

## 2019-09-20 ENCOUNTER — Telehealth: Payer: Self-pay | Admitting: Oncology

## 2019-09-20 NOTE — Telephone Encounter (Signed)
I talk with patient regarding schedule  

## 2019-11-29 ENCOUNTER — Encounter: Payer: Self-pay | Admitting: Family Medicine

## 2019-11-29 ENCOUNTER — Ambulatory Visit (INDEPENDENT_AMBULATORY_CARE_PROVIDER_SITE_OTHER): Payer: Medicare Other | Admitting: Family Medicine

## 2019-11-29 ENCOUNTER — Other Ambulatory Visit: Payer: Self-pay

## 2019-11-29 VITALS — BP 138/86 | HR 64 | Temp 97.9°F | Resp 18 | Wt 132.0 lb

## 2019-11-29 DIAGNOSIS — E559 Vitamin D deficiency, unspecified: Secondary | ICD-10-CM | POA: Diagnosis not present

## 2019-11-29 DIAGNOSIS — I10 Essential (primary) hypertension: Secondary | ICD-10-CM

## 2019-11-29 DIAGNOSIS — E782 Mixed hyperlipidemia: Secondary | ICD-10-CM | POA: Diagnosis not present

## 2019-11-29 DIAGNOSIS — R739 Hyperglycemia, unspecified: Secondary | ICD-10-CM | POA: Diagnosis not present

## 2019-11-29 DIAGNOSIS — E079 Disorder of thyroid, unspecified: Secondary | ICD-10-CM

## 2019-11-29 LAB — LIPID PANEL
Cholesterol: 114 mg/dL (ref 0–200)
HDL: 43 mg/dL (ref >39.00)
LDL Cholesterol: 48 mg/dL (ref 0–99)
NonHDL: 70.69
Total CHOL/HDL Ratio: 3
Triglycerides: 112 mg/dL (ref 0.0–149.0)
VLDL: 22.4 mg/dL (ref 0.0–40.0)

## 2019-11-29 LAB — COMPREHENSIVE METABOLIC PANEL
ALT: 42 U/L — ABNORMAL HIGH (ref 0–35)
AST: 31 U/L (ref 0–37)
Albumin: 4.3 g/dL (ref 3.5–5.2)
Alkaline Phosphatase: 47 U/L (ref 39–117)
BUN: 12 mg/dL (ref 6–23)
CO2: 27 mEq/L (ref 19–32)
Calcium: 9.6 mg/dL (ref 8.4–10.5)
Chloride: 106 mEq/L (ref 96–112)
Creatinine, Ser: 0.61 mg/dL (ref 0.40–1.20)
GFR: 96.41 mL/min (ref >60.00)
Glucose, Bld: 101 mg/dL — ABNORMAL HIGH (ref 70–99)
Potassium: 4.3 mEq/L (ref 3.5–5.1)
Sodium: 142 mEq/L (ref 135–145)
Total Bilirubin: 0.3 mg/dL (ref 0.2–1.2)
Total Protein: 6.3 g/dL (ref 6.0–8.3)

## 2019-11-29 LAB — TSH: TSH: 2.5 u[IU]/mL (ref 0.35–4.50)

## 2019-11-29 LAB — HEMOGLOBIN A1C: Hgb A1c MFr Bld: 5.7 % (ref 4.6–6.5)

## 2019-11-29 LAB — CBC
HCT: 38.2 % (ref 36.0–46.0)
Hemoglobin: 12.5 g/dL (ref 12.0–15.0)
MCHC: 32.8 g/dL (ref 30.0–36.0)
MCV: 95 fl (ref 78.0–100.0)
Platelets: 164 10*3/uL (ref 150.0–400.0)
RBC: 4.02 Mil/uL (ref 3.87–5.11)
RDW: 12.9 % (ref 11.5–15.5)
WBC: 5.9 10*3/uL (ref 4.0–10.5)

## 2019-11-29 LAB — VITAMIN D 25 HYDROXY (VIT D DEFICIENCY, FRACTURES): VITD: 84.77 ng/mL (ref 30.00–100.00)

## 2019-11-29 MED ORDER — LEVOTHYROXINE SODIUM 75 MCG PO TABS: 75.0000 ug | ORAL_TABLET | Freq: Every day | ORAL | 1 refills | Status: DC

## 2019-11-29 MED ORDER — AMLODIPINE BESYLATE 5 MG PO TABS: 5.0000 mg | ORAL_TABLET | Freq: Every day | ORAL | 1 refills | Status: DC

## 2019-11-29 NOTE — Patient Instructions (Addendum)
Multivitamin with minerals, selenium Probiotic daily Aspirin EC 81 mg daily  Melatonin 2.5-5 mg at bedtime  Pulse oximeter want oxygen in the 90s  Weekly vitals     Hypothyroidism  Hypothyroidism is when the thyroid gland does not make enough of certain hormones (it is underactive). The thyroid gland is a small gland located in the lower front part of the neck, just in front of the windpipe (trachea). This gland makes hormones that help control how the body uses food for energy (metabolism) as well as how the heart and brain function. These hormones also play a role in keeping your bones strong. When the thyroid is underactive, it produces too little of the hormones thyroxine (T4) and triiodothyronine (T3). What are the causes? This condition may be caused by:  Hashimoto's disease. This is a disease in which the body's disease-fighting system (immune system) attacks the thyroid gland. This is the most common cause.  Viral infections.  Pregnancy.  Certain medicines.  Birth defects.  Past radiation treatments to the head or neck for cancer.  Past treatment with radioactive iodine.  Past exposure to radiation in the environment.  Past surgical removal of part or all of the thyroid.  Problems with a gland in the center of the brain (pituitary gland).  Lack of enough iodine in the diet. What increases the risk? You are more likely to develop this condition if:  You are female.  You have a family history of thyroid conditions.  You use a medicine called lithium.  You take medicines that affect the immune system (immunosuppressants). What are the signs or symptoms? Symptoms of this condition include:  Feeling as though you have no energy (lethargy).  Not being able to tolerate cold.  Weight gain that is not explained by a change in diet or exercise habits.  Lack of appetite.  Dry skin.  Coarse hair.  Menstrual irregularity.  Slowing of thought processes.   Constipation.  Sadness or depression. How is this diagnosed? This condition may be diagnosed based on:  Your symptoms, your medical history, and a physical exam.  Blood tests. You may also have imaging tests, such as an ultrasound or MRI. How is this treated? This condition is treated with medicine that replaces the thyroid hormones that your body does not make. After you begin treatment, it may take several weeks for symptoms to go away. Follow these instructions at home:  Take over-the-counter and prescription medicines only as told by your health care provider.  If you start taking any new medicines, tell your health care provider.  Keep all follow-up visits as told by your health care provider. This is important. ? As your condition improves, your dosage of thyroid hormone medicine may change. ? You will need to have blood tests regularly so that your health care provider can monitor your condition. Contact a health care provider if:  Your symptoms do not get better with treatment.  You are taking thyroid replacement medicine and you: ? Sweat a lot. ? Have tremors. ? Feel anxious. ? Lose weight rapidly. ? Cannot tolerate heat. ? Have emotional swings. ? Have diarrhea. ? Feel weak. Get help right away if you have:  Chest pain.  An irregular heartbeat.  A rapid heartbeat.  Difficulty breathing. Summary  Hypothyroidism is when the thyroid gland does not make enough of certain hormones (it is underactive).  When the thyroid is underactive, it produces too little of the hormones thyroxine (T4) and triiodothyronine (T3).  The most  common cause is Hashimoto's disease, a disease in which the body's disease-fighting system (immune system) attacks the thyroid gland. The condition can also be caused by viral infections, medicine, pregnancy, or past radiation treatment to the head or neck.  Symptoms may include weight gain, dry skin, constipation, feeling as though you do  not have energy, and not being able to tolerate cold.  This condition is treated with medicine to replace the thyroid hormones that your body does not make. This information is not intended to replace advice given to you by your health care provider. Make sure you discuss any questions you have with your health care provider. Document Revised: 10/07/2017 Document Reviewed: 10/05/2017 Elsevier Patient Education  2020 [employer].

## 2019-12-02 NOTE — Assessment & Plan Note (Signed)
On Levothyroxine, continue to monitor. She can no longer easily afford how much her brand name Synthroid is costing her now. Will try switching her to generic version and monitor

## 2019-12-02 NOTE — Assessment & Plan Note (Signed)
Encouraged heart healthy diet, increase exercise, avoid trans fats, consider a krill oil cap daily 

## 2019-12-02 NOTE — Assessment & Plan Note (Signed)
hgba1c acceptable, minimize simple carbs. Increase exercise as tolerated.  

## 2019-12-02 NOTE — Progress Notes (Signed)
Subjective:    Patient ID: Tammy Grimes, female    DOB: [birth year], 74 y.o.   MRN: [id number]  Chief Complaint  Patient presents with  . Follow-up    HPI Patient is in today for follow up on chronic medical concerns. She has been on brand name synthroid for years and done well but now her insurance is changing her $190 a month and she is ready to try a generic. No recent febrile illness or hospitalizations. She is struggling with some chronic back and knee pain but no recent falls or trauma. No redness or warmth. Denies CP/palp/SOB/HA/congestion/fevers/GI or GU c/o. Taking meds as prescribed  Past Medical History:  Diagnosis Date  . Allergy   . Anemia    prior to hysterectomy  . BCC (basal cell carcinoma) 08/01/2015  . Breast cancer ([facility]) 09/25/2018   right  . Cancer ([place]) 2007   calf of right leg- melanoma  . Cataract   . Cervical cancer screening 06/06/2012  . DDD (degenerative disc disease), cervical 08/01/2015  . Family history of bladder cancer   . Family history of breast cancer   . Family history of colon cancer   . Family history of ovarian cancer   . Family history of prostate cancer   . GERD (gastroesophageal reflux disease)   . Hyperglycemia 03/28/2017  . Hyperlipidemia   . Hypertension 65  . Hypothyroidism   . Knee pain 03/30/2012   L>R  . Low back pain 03/23/2018  . Melanoma of skin ([facility]) 08/19/2016  . PONV (postoperative nausea and vomiting)   . Thyroid disease 64  . Vitamin D deficiency 06/06/2012    Past Surgical History:  Procedure Laterality Date  . ABDOMINAL HYSTERECTOMY  1990's   total, for heavy bleeding and fibroids  . BASAL CELL CARCINOMA EXCISION    . BREAST LUMPECTOMY WITH RADIOACTIVE SEED AND SENTINEL LYMPH NODE BIOPSY Right 11/07/2018   Procedure: RIGHT BREAST LUMPECTOMY WITH RADIOACTIVE SEED AND SENTINEL LYMPH NODE BIOPSY;  Surgeon: [name], MD;  Location: [facility];  Service: General;  Laterality: Right;  . BREAST  SURGERY  early 70's   fibroid tumors removed- benign  . CATARACT EXTRACTION Bilateral 12/09/12  . COLONOSCOPY    . EYE SURGERY Bilateral 2015   cataracts  . MELANOMA EXCISION  2007    Family History  Problem Relation Age of Onset  . Hypertension Mother   . Alzheimer's disease Mother   . Dementia Mother        alzheimer's  . Aortic aneurysm Father   . Hypertension Father   . COPD Father        smoker  . Heart disease Father        s/p bypass, aortic aneurysm, carotid artery disease  . Breast cancer Sister 67  . Diabetes Son 22       type 1  . Alzheimer's disease Maternal Grandmother   . Prostate cancer Maternal Grandfather 60       metastatic  . Stroke Paternal Grandfather   . Ovarian cancer Maternal Aunt 75  . Cancer Maternal Aunt        cervical or uterine  . Colon cancer Paternal Uncle 42  . Breast cancer Cousin 65  . Breast cancer Cousin 26  . Colon cancer Cousin        dx >50  . Esophageal cancer Neg Hx   . Pancreatic cancer Neg Hx   . Rectal cancer Neg Hx   . Stomach cancer  Neg Hx     Social History   Socioeconomic History  . Marital status: Married    Spouse name: Not on file  . Number of children: Not on file  . Years of education: Not on file  . Highest education level: Not on file  Occupational History  . Not on file  Tobacco Use  . Smoking status: Never Smoker  . Smokeless tobacco: Never Used  Substance and Sexual Activity  . Alcohol use: No  . Drug use: No  . Sexual activity: Yes    Comment: lives with husband, retired from [employer] work, no dietary restrictions  Other Topics Concern  . Not on file  Social History Narrative  . Not on file   Social Determinants of Health   Financial Resource Strain:   . Difficulty of Paying Living Expenses: Not on file  Food Insecurity:   . Worried About Charity fundraiser in the Last Year: Not on file  . Ran Out of Food in the Last Year: Not on file  Transportation Needs: No Transportation Needs   . Lack of Transportation (Medical): No  . Lack of Transportation (Non-Medical): No  Physical Activity:   . Days of Exercise per Week: Not on file  . Minutes of Exercise per Session: Not on file  Stress:   . Feeling of Stress : Not on file  Social Connections:   . Frequency of Communication with Friends and Family: Not on file  . Frequency of Social Gatherings with Friends and Family: Not on file  . Attends Religious Services: Not on file  . Active Member of Clubs or Organizations: Not on file  . Attends Archivist Meetings: Not on file  . Marital Status: Not on file  Intimate Partner Violence: Not At Risk  . Fear of Current or Ex-Partner: No  . Emotionally Abused: No  . Physically Abused: No  . Sexually Abused: No    Outpatient Medications Prior to Visit  Medication Sig Dispense Refill  . aspirin 81 MG tablet Take 81 mg by mouth daily.    . calcium-vitamin D 250-100 MG-UNIT per tablet Take 1 tablet by mouth daily.    . metoprolol tartrate (LOPRESSOR) 25 MG tablet Take 1/2 tablet (12.5mg  ) by mouth two times a day 90 tablet 1  . Multiple Vitamins-Minerals (CENTRUM SILVER 50+WOMEN) TABS Use as directed    . omeprazole (PRILOSEC) 20 MG capsule Take 1 capsule (20 mg total) by mouth daily. 90 capsule 3  . rosuvastatin (CRESTOR) 5 MG tablet TAKE 1 TABLET BY MOUTH EVERY DAY 90 tablet 1  . tamoxifen (NOLVADEX) 20 MG tablet Take 1 tablet (20 mg total) by mouth daily. 90 tablet 4  . amLODipine (NORVASC) 5 MG tablet Take 1 tablet (5 mg total) by mouth daily. 90 tablet 1  . SYNTHROID 75 MCG tablet TAKE 1 TABLET(75 MCG) BY MOUTH DAILY BEFORE BREAKFAST 90 tablet 3   No facility-administered medications prior to visit.    Allergies  Allergen Reactions  . Penicillins Rash    Review of Systems  Constitutional: Negative for fever and malaise/fatigue.  HENT: Negative for congestion.   Eyes: Negative for blurred vision.  Respiratory: Negative for shortness of breath.    Cardiovascular: Negative for chest pain, palpitations and leg swelling.  Gastrointestinal: Negative for abdominal pain, blood in stool and nausea.  Genitourinary: Negative for dysuria and frequency.  Musculoskeletal: Negative for falls.  Skin: Negative for rash.  Neurological: Negative for dizziness, loss of consciousness  and headaches.  Endo/Heme/Allergies: Negative for environmental allergies.  Psychiatric/Behavioral: Negative for depression. The patient is not nervous/anxious.        Objective:    Physical Exam Vitals and nursing note reviewed.  Constitutional:      General: She is not in acute distress.    Appearance: She is well-developed.  HENT:     Head: Normocephalic and atraumatic.     Nose: Nose normal.  Eyes:     General:        Right eye: No discharge.        Left eye: No discharge.  Cardiovascular:     Rate and Rhythm: Normal rate and regular rhythm.     Heart sounds: No murmur.  Pulmonary:     Effort: Pulmonary effort is normal.     Breath sounds: Normal breath sounds.  Abdominal:     General: Bowel sounds are normal.     Palpations: Abdomen is soft.     Tenderness: There is no abdominal tenderness.  Musculoskeletal:     Cervical back: Normal range of motion and neck supple.  Skin:    General: Skin is warm and dry.  Neurological:     Mental Status: She is alert and oriented to person, place, and time.     BP 138/86   Pulse 64   Temp 97.9 F (36.6 C)   Resp 18   Wt 132 lb (59.9 kg)   SpO2 98%   BMI 24.14 kg/m  Wt Readings from Last 3 Encounters:  11/29/19 132 lb (59.9 kg)  09/18/19 134 lb 6.4 oz (61 kg)  08/20/19 131 lb 1.9 oz (59.5 kg)    Diabetic Foot Exam - Simple   No data filed     Lab Results  Component Value Date   WBC 5.9 11/29/2019   HGB 12.5 11/29/2019   HCT 38.2 11/29/2019   PLT 164.0 11/29/2019   GLUCOSE 101 (H) 11/29/2019   CHOL 114 11/29/2019   TRIG 112.0 11/29/2019   HDL 43.00 11/29/2019   LDLDIRECT 152.0 08/01/2015    LDLCALC 48 11/29/2019   ALT 42 (H) 11/29/2019   AST 31 11/29/2019   NA 142 11/29/2019   K 4.3 11/29/2019   CL 106 11/29/2019   CREATININE 0.61 11/29/2019   BUN 12 11/29/2019   CO2 27 11/29/2019   TSH 2.50 11/29/2019   HGBA1C 5.7 11/29/2019    Lab Results  Component Value Date   TSH 2.50 11/29/2019   Lab Results  Component Value Date   WBC 5.9 11/29/2019   HGB 12.5 11/29/2019   HCT 38.2 11/29/2019   MCV 95.0 11/29/2019   PLT 164.0 11/29/2019   Lab Results  Component Value Date   NA 142 11/29/2019   K 4.3 11/29/2019   CO2 27 11/29/2019   GLUCOSE 101 (H) 11/29/2019   BUN 12 11/29/2019   CREATININE 0.61 11/29/2019   BILITOT 0.3 11/29/2019   ALKPHOS 47 11/29/2019   AST 31 11/29/2019   ALT 42 (H) 11/29/2019   PROT 6.3 11/29/2019   ALBUMIN 4.3 11/29/2019   CALCIUM 9.6 11/29/2019   ANIONGAP 11 09/18/2019   GFR 96.41 11/29/2019   Lab Results  Component Value Date   CHOL 114 11/29/2019   Lab Results  Component Value Date   HDL 43.00 11/29/2019   Lab Results  Component Value Date   LDLCALC 48 11/29/2019   Lab Results  Component Value Date   TRIG 112.0 11/29/2019   Lab Results  Component  Value Date   CHOLHDL 3 11/29/2019   Lab Results  Component Value Date   HGBA1C 5.7 11/29/2019       Assessment & Plan:   Problem List Items Addressed This Visit    Thyroid disease    On Levothyroxine, continue to monitor. She can no longer easily afford how much her brand name Synthroid is costing her now. Will try switching her to generic version and monitor      Relevant Medications   levothyroxine (SYNTHROID) 75 MCG tablet   Other Relevant Orders   TSH (Completed)   Essential hypertension - Primary    Improved on recheck, move Amlodipine to qhs and she will monitor and let us know if remains elevated      Relevant Medications   amLODipine (NORVASC) 5 MG tablet   Other Relevant Orders   CBC (Completed)   Comprehensive metabolic panel (Completed)    Hyperlipidemia    Encouraged heart healthy diet, increase exercise, avoid trans fats, consider a krill oil cap daily      Relevant Medications   amLODipine (NORVASC) 5 MG tablet   Other Relevant Orders   Lipid panel (Completed)   Vitamin D deficiency    Supplement and monitor      Relevant Orders   VITAMIN D 25 Hydroxy (Vit-D Deficiency, Fractures) (Completed)   Hyperglycemia    hgba1c acceptable, minimize simple carbs. Increase exercise as tolerated.       Relevant Orders   Hemoglobin A1c (Completed)      I have discontinued Olin Hauser K. Suazo "[name]"'s Synthroid. I have also changed her amLODipine. Additionally, I am having her start on levothyroxine. Lastly, I am having her maintain her aspirin, calcium-vitamin D, omeprazole, metoprolol tartrate, rosuvastatin, Centrum Silver 50+Women, and tamoxifen.  Meds ordered this encounter  Medications  . amLODipine (NORVASC) 5 MG tablet    Sig: Take 1 tablet (5 mg total) by mouth at bedtime.    Dispense:  90 tablet    Refill:  1  . levothyroxine (SYNTHROID) 75 MCG tablet    Sig: Take 1 tablet (75 mcg total) by mouth daily.    Dispense:  90 tablet    Refill:  1     Penni Homans, MD

## 2019-12-02 NOTE — Assessment & Plan Note (Signed)
Improved on recheck, move Amlodipine to qhs and she will monitor and let us know if remains elevated

## 2019-12-02 NOTE — Assessment & Plan Note (Signed)
Supplement and monitor 

## 2019-12-03 ENCOUNTER — Other Ambulatory Visit: Payer: Self-pay | Admitting: Family Medicine

## 2019-12-04 ENCOUNTER — Telehealth: Payer: Self-pay | Admitting: *Deleted

## 2019-12-04 MED ORDER — LEVOTHYROXINE SODIUM 75 MCG PO TABS: 75.0000 ug | ORAL_TABLET | Freq: Every day | ORAL | 1 refills | Status: DC

## 2019-12-04 MED ORDER — OMEPRAZOLE 20 MG PO CPDR: 20.0000 mg | DELAYED_RELEASE_CAPSULE | Freq: Every day | ORAL | 1 refills | Status: DC

## 2019-12-04 NOTE — Telephone Encounter (Signed)
Received fax from [facility] for omeprazole and levothyroxin. Refills sent.

## 2019-12-14 ENCOUNTER — Encounter: Payer: Medicare Other | Admitting: Adult Health

## 2019-12-18 DIAGNOSIS — D1801 Hemangioma of skin and subcutaneous tissue: Secondary | ICD-10-CM | POA: Diagnosis not present

## 2019-12-18 DIAGNOSIS — L570 Actinic keratosis: Secondary | ICD-10-CM | POA: Diagnosis not present

## 2019-12-18 DIAGNOSIS — Z85828 Personal history of other malignant neoplasm of skin: Secondary | ICD-10-CM | POA: Diagnosis not present

## 2019-12-18 DIAGNOSIS — D225 Melanocytic nevi of trunk: Secondary | ICD-10-CM | POA: Diagnosis not present

## 2019-12-25 DIAGNOSIS — Z170 Estrogen receptor positive status [ER+]: Secondary | ICD-10-CM | POA: Diagnosis not present

## 2019-12-25 DIAGNOSIS — C50511 Malignant neoplasm of lower-outer quadrant of right female breast: Secondary | ICD-10-CM | POA: Diagnosis not present

## 2019-12-25 DIAGNOSIS — I890 Lymphedema, not elsewhere classified: Secondary | ICD-10-CM | POA: Diagnosis not present

## 2020-02-09 ENCOUNTER — Other Ambulatory Visit: Payer: Self-pay | Admitting: Family Medicine

## 2020-02-12 DIAGNOSIS — H00024 Hordeolum internum left upper eyelid: Secondary | ICD-10-CM | POA: Diagnosis not present

## 2020-03-03 ENCOUNTER — Ambulatory Visit (INDEPENDENT_AMBULATORY_CARE_PROVIDER_SITE_OTHER): Payer: Medicare Other | Admitting: Family Medicine

## 2020-03-03 ENCOUNTER — Other Ambulatory Visit: Payer: Self-pay

## 2020-03-03 ENCOUNTER — Encounter: Payer: Self-pay | Admitting: Family Medicine

## 2020-03-03 VITALS — BP 132/80 | HR 79 | Temp 97.8°F | Resp 12 | Ht 62.0 in | Wt 130.2 lb

## 2020-03-03 DIAGNOSIS — R55 Syncope and collapse: Secondary | ICD-10-CM

## 2020-03-03 DIAGNOSIS — R739 Hyperglycemia, unspecified: Secondary | ICD-10-CM

## 2020-03-03 DIAGNOSIS — D649 Anemia, unspecified: Secondary | ICD-10-CM

## 2020-03-03 DIAGNOSIS — E559 Vitamin D deficiency, unspecified: Secondary | ICD-10-CM

## 2020-03-03 DIAGNOSIS — E079 Disorder of thyroid, unspecified: Secondary | ICD-10-CM | POA: Diagnosis not present

## 2020-03-03 DIAGNOSIS — R002 Palpitations: Secondary | ICD-10-CM

## 2020-03-03 DIAGNOSIS — I10 Essential (primary) hypertension: Secondary | ICD-10-CM

## 2020-03-03 DIAGNOSIS — E782 Mixed hyperlipidemia: Secondary | ICD-10-CM

## 2020-03-03 LAB — COMPREHENSIVE METABOLIC PANEL
ALT: 31 U/L (ref 0–35)
AST: 24 U/L (ref 0–37)
Albumin: 4.2 g/dL (ref 3.5–5.2)
Alkaline Phosphatase: 48 U/L (ref 39–117)
BUN: 17 mg/dL (ref 6–23)
CO2: 26 mEq/L (ref 19–32)
Calcium: 9.3 mg/dL (ref 8.4–10.5)
Chloride: 103 mEq/L (ref 96–112)
Creatinine, Ser: 0.64 mg/dL (ref 0.40–1.20)
GFR: 91.15 mL/min (ref >60.00)
Glucose, Bld: 104 mg/dL — ABNORMAL HIGH (ref 70–99)
Potassium: 4.6 mEq/L (ref 3.5–5.1)
Sodium: 138 mEq/L (ref 135–145)
Total Bilirubin: 0.4 mg/dL (ref 0.2–1.2)
Total Protein: 6.7 g/dL (ref 6.0–8.3)

## 2020-03-03 LAB — CBC
HCT: 36.6 % (ref 36.0–46.0)
Hemoglobin: 12.1 g/dL (ref 12.0–15.0)
MCHC: 33.1 g/dL (ref 30.0–36.0)
MCV: 95.1 fl (ref 78.0–100.0)
Platelets: 226 10*3/uL (ref 150.0–400.0)
RBC: 3.85 Mil/uL — ABNORMAL LOW (ref 3.87–5.11)
RDW: 13 % (ref 11.5–15.5)
WBC: 7 10*3/uL (ref 4.0–10.5)

## 2020-03-03 LAB — LIPID PANEL
Cholesterol: 102 mg/dL (ref 0–200)
HDL: 32.3 mg/dL — ABNORMAL LOW (ref >39.00)
LDL Cholesterol: 38 mg/dL (ref 0–99)
NonHDL: 69.94
Total CHOL/HDL Ratio: 3
Triglycerides: 162 mg/dL — ABNORMAL HIGH (ref 0.0–149.0)
VLDL: 32.4 mg/dL (ref 0.0–40.0)

## 2020-03-03 LAB — MAGNESIUM: Magnesium: 1.7 mg/dL (ref 1.5–2.5)

## 2020-03-03 LAB — TSH: TSH: 3.94 u[IU]/mL (ref 0.35–4.50)

## 2020-03-03 LAB — HEMOGLOBIN A1C: Hgb A1c MFr Bld: 5.8 % (ref 4.6–6.5)

## 2020-03-03 NOTE — Assessment & Plan Note (Signed)
Increase leafy greens, consider increased lean red meat and using cast iron cookware. Continue to monitor, report any concerns 

## 2020-03-03 NOTE — Progress Notes (Signed)
Subjective:    Patient ID: Tammy Grimes, female    DOB: Sep 01, 1946, 74 y.o.   MRN: [id number]  Chief Complaint  Patient presents with  . 3 month follow up    HPI Patient is in today for follow up on chronic medical concerns. No recent febrile illness or hospitalizations. She feels well today. Roughly 10 days ago she woke up feeling stiff and achy. Her heart was beating hard. When she got up to go to the restroom she based out. None of this recurred and she feels well today. She feels she might have been dehydrated. Denies CP/SOB/HA/congestion/fevers/GI or GU c/o. Taking meds as prescribed  Past Medical History:  Diagnosis Date  . Allergy   . Anemia    prior to hysterectomy  . BCC (basal cell carcinoma) 08/01/2015  . Breast cancer ([facility]) 09/25/2018   right  . Cancer ([facility]) 2007   calf of right leg- melanoma  . Cataract   . Cervical cancer screening 06/06/2012  . DDD (degenerative disc disease), cervical 08/01/2015  . Family history of bladder cancer   . Family history of breast cancer   . Family history of colon cancer   . Family history of ovarian cancer   . Family history of prostate cancer   . GERD (gastroesophageal reflux disease)   . Hyperglycemia 03/28/2017  . Hyperlipidemia   . Hypertension 65  . Hypothyroidism   . Knee pain 03/30/2012   L>R  . Low back pain 03/23/2018  . Melanoma of skin ([facility]) 08/19/2016  . PONV (postoperative nausea and vomiting)   . Thyroid disease 64  . Vitamin D deficiency 06/06/2012    Past Surgical History:  Procedure Laterality Date  . ABDOMINAL HYSTERECTOMY  1990's   total, for heavy bleeding and fibroids  . BASAL CELL CARCINOMA EXCISION    . BREAST LUMPECTOMY WITH RADIOACTIVE SEED AND SENTINEL LYMPH NODE BIOPSY Right 11/07/2018   Procedure: RIGHT BREAST LUMPECTOMY WITH RADIOACTIVE SEED AND SENTINEL LYMPH NODE BIOPSY;  Surgeon: [name], MD;  Location: [facility];  Service: General;  Laterality: Right;  . BREAST  SURGERY  early 70's   fibroid tumors removed- benign  . CATARACT EXTRACTION Bilateral 12/09/12  . COLONOSCOPY    . EYE SURGERY Bilateral 2015   cataracts  . MELANOMA EXCISION  2007    Family History  Problem Relation Age of Onset  . Hypertension Mother   . Alzheimer's disease Mother   . Dementia Mother        alzheimer's  . Aortic aneurysm Father   . Hypertension Father   . COPD Father        smoker  . Heart disease Father        s/p bypass, aortic aneurysm, carotid artery disease  . Breast cancer Sister 79  . Diabetes Son 22       type 1  . Alzheimer's disease Maternal Grandmother   . Prostate cancer Maternal Grandfather 60       metastatic  . Stroke Paternal Grandfather   . Ovarian cancer Maternal Aunt 75  . Cancer Maternal Aunt        cervical or uterine  . Colon cancer Paternal Uncle 15  . Breast cancer Cousin 22  . Breast cancer Cousin 61  . Colon cancer Cousin        dx >50  . Esophageal cancer Neg Hx   . Pancreatic cancer Neg Hx   . Rectal cancer Neg Hx   . Stomach  cancer Neg Hx     Social History   Socioeconomic History  . Marital status: Married    Spouse name: Not on file  . Number of children: Not on file  . Years of education: Not on file  . Highest education level: Not on file  Occupational History  . Not on file  Tobacco Use  . Smoking status: Never Smoker  . Smokeless tobacco: Never Used  Substance and Sexual Activity  . Alcohol use: No  . Drug use: No  . Sexual activity: Yes    Comment: lives with husband, retired from [employer] work, no dietary restrictions  Other Topics Concern  . Not on file  Social History Narrative  . Not on file   Social Determinants of Health   Financial Resource Strain:   . Difficulty of Paying Living Expenses:   Food Insecurity:   . Worried About Charity fundraiser in the Last Year:   . Arboriculturist in the Last Year:   Transportation Needs:   . Film/video editor (Medical):   Marland Kitchen Lack of  Transportation (Non-Medical):   Physical Activity:   . Days of Exercise per Week:   . Minutes of Exercise per Session:   Stress:   . Feeling of Stress :   Social Connections:   . Frequency of Communication with Friends and Family:   . Frequency of Social Gatherings with Friends and Family:   . Attends Religious Services:   . Active Member of Clubs or Organizations:   . Attends Archivist Meetings:   Marland Kitchen Marital Status:   Intimate Partner Violence:   . Fear of Current or Ex-Partner:   . Emotionally Abused:   Marland Kitchen Physically Abused:   . Sexually Abused:     Outpatient Medications Prior to Visit  Medication Sig Dispense Refill  . amLODipine (NORVASC) 5 MG tablet Take 1 tablet (5 mg total) by mouth at bedtime. 90 tablet 1  . aspirin 81 MG tablet Take 81 mg by mouth daily.    . calcium-vitamin D 250-100 MG-UNIT per tablet Take 1 tablet by mouth daily.    Marland Kitchen levothyroxine (SYNTHROID) 75 MCG tablet Take 1 tablet (75 mcg total) by mouth daily. 90 tablet 1  . metoprolol tartrate (LOPRESSOR) 25 MG tablet TAKE 1/2 TABLET(12.5 MG) BY MOUTH TWICE DAILY 90 tablet 1  . Multiple Vitamins-Minerals (CENTRUM SILVER 50+WOMEN) TABS Use as directed    . Omega-3 Fatty Acids (OMEGA 3 500) 500 MG CAPS Take 1 capsule by mouth daily.    Marland Kitchen omeprazole (PRILOSEC) 20 MG capsule Take 1 capsule (20 mg total) by mouth daily. 90 capsule 1  . Probiotic Product (PROBIOTIC DAILY) CAPS Take 1 tablet by mouth daily.    . rosuvastatin (CRESTOR) 5 MG tablet TAKE 1 TABLET BY MOUTH  DAILY 90 tablet 3  . tamoxifen (NOLVADEX) 20 MG tablet Take 1 tablet (20 mg total) by mouth daily. 90 tablet 4  . TURMERIC CURCUMIN PO Take 1,650 mg by mouth daily.     No facility-administered medications prior to visit.    Allergies  Allergen Reactions  . Penicillins Rash    Review of Systems  Constitutional: Negative for fever and malaise/fatigue.  HENT: Negative for congestion.   Eyes: Negative for blurred vision.    Respiratory: Negative for shortness of breath.   Cardiovascular: Positive for palpitations. Negative for chest pain and leg swelling.  Gastrointestinal: Negative for abdominal pain, blood in stool and nausea.  Genitourinary: Negative  for dysuria and frequency.  Musculoskeletal: Negative for falls.  Skin: Negative for rash.  Neurological: Positive for loss of consciousness. Negative for dizziness and headaches.  Endo/Heme/Allergies: Negative for environmental allergies.  Psychiatric/Behavioral: Negative for depression. The patient is not nervous/anxious.        Objective:    Physical Exam Vitals and nursing note reviewed.  Constitutional:      General: She is not in acute distress.    Appearance: She is well-developed.  HENT:     Head: Normocephalic and atraumatic.     Nose: Nose normal.  Eyes:     General:        Right eye: No discharge.        Left eye: No discharge.  Cardiovascular:     Rate and Rhythm: Normal rate and regular rhythm.     Heart sounds: No murmur.  Pulmonary:     Effort: Pulmonary effort is normal.     Breath sounds: Normal breath sounds.  Abdominal:     General: Bowel sounds are normal.     Palpations: Abdomen is soft.     Tenderness: There is no abdominal tenderness.  Musculoskeletal:     Cervical back: Normal range of motion and neck supple.  Skin:    General: Skin is warm and dry.  Neurological:     Mental Status: She is alert and oriented to person, place, and time.     BP 132/80 (BP Location: Right Arm, Cuff Size: Normal)   Pulse 79   Temp 97.8 F (36.6 C) (Temporal)   Resp 12   Ht 5\' 2"  (1.575 m)   Wt 130 lb 3.2 oz (59.1 kg)   SpO2 96%   BMI 23.81 kg/m  Wt Readings from Last 3 Encounters:  03/03/20 130 lb 3.2 oz (59.1 kg)  11/29/19 132 lb (59.9 kg)  09/18/19 134 lb 6.4 oz (61 kg)    Diabetic Foot Exam - Simple   No data filed     Lab Results  Component Value Date   WBC 5.9 11/29/2019   HGB 12.5 11/29/2019   HCT 38.2  11/29/2019   PLT 164.0 11/29/2019   GLUCOSE 101 (H) 11/29/2019   CHOL 114 11/29/2019   TRIG 112.0 11/29/2019   HDL 43.00 11/29/2019   LDLDIRECT 152.0 08/01/2015   LDLCALC 48 11/29/2019   ALT 42 (H) 11/29/2019   AST 31 11/29/2019   NA 142 11/29/2019   K 4.3 11/29/2019   CL 106 11/29/2019   CREATININE 0.61 11/29/2019   BUN 12 11/29/2019   CO2 27 11/29/2019   TSH 2.50 11/29/2019   HGBA1C 5.7 11/29/2019    Lab Results  Component Value Date   TSH 2.50 11/29/2019   Lab Results  Component Value Date   WBC 5.9 11/29/2019   HGB 12.5 11/29/2019   HCT 38.2 11/29/2019   MCV 95.0 11/29/2019   PLT 164.0 11/29/2019   Lab Results  Component Value Date   NA 142 11/29/2019   K 4.3 11/29/2019   CO2 27 11/29/2019   GLUCOSE 101 (H) 11/29/2019   BUN 12 11/29/2019   CREATININE 0.61 11/29/2019   BILITOT 0.3 11/29/2019   ALKPHOS 47 11/29/2019   AST 31 11/29/2019   ALT 42 (H) 11/29/2019   PROT 6.3 11/29/2019   ALBUMIN 4.3 11/29/2019   CALCIUM 9.6 11/29/2019   ANIONGAP 11 09/18/2019   GFR 96.41 11/29/2019   Lab Results  Component Value Date   CHOL 114 11/29/2019   Lab Results  Component Value Date   HDL 43.00 11/29/2019   Lab Results  Component Value Date   LDLCALC 48 11/29/2019   Lab Results  Component Value Date   TRIG 112.0 11/29/2019   Lab Results  Component Value Date   CHOLHDL 3 11/29/2019   Lab Results  Component Value Date   HGBA1C 5.7 11/29/2019       Assessment & Plan:   Problem List Items Addressed This Visit    Thyroid disease    On Levothyroxine, continue to monitor      Relevant Orders   TSH   Essential hypertension - Primary    Well controlled, no changes to meds. Encouraged heart healthy diet such as the DASH diet and exercise as tolerated.       Anemia    Increase leafy greens, consider increased lean red meat and using cast iron cookware. Continue to monitor, report any concerns      Relevant Orders   CBC   Hyperlipidemia    Relevant Orders   Lipid panel   Vitamin D deficiency    Supplement and monitor      Relevant Orders   Comprehensive metabolic panel   Hyperglycemia    hgba1c acceptable, minimize simple carbs. Increase exercise as tolerated.      Relevant Orders   Hemoglobin A1c   Palpitations    Roughly 10 days ago she woke up feeling stiff and achy. Her heart was beating hard. When she got up to go to the restroom she based out. None of this recurred and she feels well today. She feels she might have been dehydrated. She agrees to proceed with echo for now and report if she has any recurrent symptoms so we can refer to cardiology. She will stay hydrated and take in adequate protein      Relevant Orders   ECHOCARDIOGRAM COMPLETE   Magnesium   Syncope    Roughly 10 days ago she woke up feeling stiff and achy. Her heart was beating hard. When she got up to go to the restroom she based out. None of this recurred and she feels well today. She feels she might have been dehydrated.      Relevant Orders   ECHOCARDIOGRAM COMPLETE      I am having Tammy Perking. Ulin "[name]" maintain her aspirin, calcium-vitamin D, Centrum Silver 50+Women, tamoxifen, amLODipine, rosuvastatin, levothyroxine, omeprazole, metoprolol tartrate, TURMERIC CURCUMIN PO, Probiotic Daily, and Omega 3 500.  No orders of the defined types were placed in this encounter.    Penni Homans, MD

## 2020-03-03 NOTE — Assessment & Plan Note (Signed)
Supplement and monitor 

## 2020-03-03 NOTE — Assessment & Plan Note (Signed)
On Levothyroxine, continue to monitor 

## 2020-03-03 NOTE — Assessment & Plan Note (Signed)
Roughly 10 days ago she woke up feeling stiff and achy. Her heart was beating hard. When she got up to go to the restroom she based out. None of this recurred and she feels well today. She feels she might have been dehydrated.

## 2020-03-03 NOTE — Assessment & Plan Note (Signed)
hgba1c acceptable, minimize simple carbs. Increase exercise as tolerated.  

## 2020-03-03 NOTE — Assessment & Plan Note (Signed)
Roughly 10 days ago she woke up feeling stiff and achy. Her heart was beating hard. When she got up to go to the restroom she based out. None of this recurred and she feels well today. She feels she might have been dehydrated. She agrees to proceed with echo for now and report if she has any recurrent symptoms so we can refer to cardiology. She will stay hydrated and take in adequate protein

## 2020-03-03 NOTE — Patient Instructions (Addendum)
Shringrix is the new shingles shot check insurance regarding coverage and where it is most cost effective to receive the shot, document and call for appt here if you would like a nurse visit   Hypertension, Adult High blood pressure (hypertension) is when the force of blood pumping through the arteries is too strong. The arteries are the blood vessels that carry blood from the heart throughout the body. Hypertension forces the heart to work harder to pump blood and may cause arteries to become narrow or stiff. Untreated or uncontrolled hypertension can cause a heart attack, heart failure, a stroke, kidney disease, and other problems. A blood pressure reading consists of a higher number over a lower number. Ideally, your blood pressure should be below 120/80. The first ("top") number is called the systolic pressure. It is a measure of the pressure in your arteries as your heart beats. The second ("bottom") number is called the diastolic pressure. It is a measure of the pressure in your arteries as the heart relaxes. What are the causes? The exact cause of this condition is not known. There are some conditions that result in or are related to high blood pressure. What increases the risk? Some risk factors for high blood pressure are under your control. The following factors may make you more likely to develop this condition:  Smoking.  Having type 2 diabetes mellitus, high cholesterol, or both.  Not getting enough exercise or physical activity.  Being overweight.  Having too much fat, sugar, calories, or salt (sodium) in your diet.  Drinking too much alcohol. Some risk factors for high blood pressure may be difficult or impossible to change. Some of these factors include:  Having chronic kidney disease.  Having a family history of high blood pressure.  Age. Risk increases with age.  Race. You may be at higher risk if you are African American.  Gender. Men are at higher risk than women  before age 26. After age 14, women are at higher risk than men.  Having obstructive sleep apnea.  Stress. What are the signs or symptoms? High blood pressure may not cause symptoms. Very high blood pressure (hypertensive crisis) may cause:  Headache.  Anxiety.  Shortness of breath.  Nosebleed.  Nausea and vomiting.  Vision changes.  Severe chest pain.  Seizures. How is this diagnosed? This condition is diagnosed by measuring your blood pressure while you are seated, with your arm resting on a flat surface, your legs uncrossed, and your feet flat on the floor. The cuff of the blood pressure monitor will be placed directly against the skin of your upper arm at the level of your heart. It should be measured at least twice using the same arm. Certain conditions can cause a difference in blood pressure between your right and left arms. Certain factors can cause blood pressure readings to be lower or higher than normal for a short period of time:  When your blood pressure is higher when you are in a health care provider's office than when you are at home, this is called white coat hypertension. Most people with this condition do not need medicines.  When your blood pressure is higher at home than when you are in a health care provider's office, this is called masked hypertension. Most people with this condition may need medicines to control blood pressure. If you have a high blood pressure reading during one visit or you have normal blood pressure with other risk factors, you may be asked to:  Return on a different day to have your blood pressure checked again.  Monitor your blood pressure at home for 1 week or longer. If you are diagnosed with hypertension, you may have other blood or imaging tests to help your health care provider understand your overall risk for other conditions. How is this treated? This condition is treated by making healthy lifestyle changes, such as eating  healthy foods, exercising more, and reducing your alcohol intake. Your health care provider may prescribe medicine if lifestyle changes are not enough to get your blood pressure under control, and if:  Your systolic blood pressure is above 130.  Your diastolic blood pressure is above 80. Your personal target blood pressure may vary depending on your medical conditions, your age, and other factors. Follow these instructions at home: Eating and drinking   Eat a diet that is high in fiber and potassium, and low in sodium, added sugar, and fat. An example eating plan is called the DASH (Dietary Approaches to Stop Hypertension) diet. To eat this way: ? Eat plenty of fresh fruits and vegetables. Try to fill one half of your plate at each meal with fruits and vegetables. ? Eat whole grains, such as whole-wheat pasta, brown rice, or whole-grain bread. Fill about one fourth of your plate with whole grains. ? Eat or drink low-fat dairy products, such as skim milk or low-fat yogurt. ? Avoid fatty cuts of meat, processed or cured meats, and poultry with skin. Fill about one fourth of your plate with lean proteins, such as fish, chicken without skin, beans, eggs, or tofu. ? Avoid pre-made and processed foods. These tend to be higher in sodium, added sugar, and fat.  Reduce your daily sodium intake. Most people with hypertension should eat less than 1,500 mg of sodium a day.  Do not drink alcohol if: ? Your health care provider tells you not to drink. ? You are pregnant, may be pregnant, or are planning to become pregnant.  If you drink alcohol: ? Limit how much you use to:  0-1 drink a day for women.  0-2 drinks a day for men. ? Be aware of how much alcohol is in your drink. In the [place] one drink equals one 12 oz bottle of beer (355 mL), one 5 oz glass of wine (148 mL), or one 1 oz glass of hard liquor (44 mL). Lifestyle   Work with your health care provider to maintain a healthy body weight or  to lose weight. Ask what an ideal weight is for you.  Get at least 30 minutes of exercise most days of the week. Activities may include walking, swimming, or biking.  Include exercise to strengthen your muscles (resistance exercise), such as Pilates or lifting weights, as part of your weekly exercise routine. Try to do these types of exercises for 30 minutes at least 3 days a week.  Do not use any products that contain nicotine or tobacco, such as cigarettes, e-cigarettes, and chewing tobacco. If you need help quitting, ask your health care provider.  Monitor your blood pressure at home as told by your health care provider.  Keep all follow-up visits as told by your health care provider. This is important. Medicines  Take over-the-counter and prescription medicines only as told by your health care provider. Follow directions carefully. Blood pressure medicines must be taken as prescribed.  Do not skip doses of blood pressure medicine. Doing this puts you at risk for problems and can make the medicine less effective.  Ask your health care provider about side effects or reactions to medicines that you should watch for. Contact a health care provider if you:  Think you are having a reaction to a medicine you are taking.  Have headaches that keep coming back (recurring).  Feel dizzy.  Have swelling in your ankles.  Have trouble with your vision. Get help right away if you:  Develop a severe headache or confusion.  Have unusual weakness or numbness.  Feel faint.  Have severe pain in your chest or abdomen.  Vomit repeatedly.  Have trouble breathing. Summary  Hypertension is when the force of blood pumping through your arteries is too strong. If this condition is not controlled, it may put you at risk for serious complications.  Your personal target blood pressure may vary depending on your medical conditions, your age, and other factors. For most people, a normal blood  pressure is less than 120/80.  Hypertension is treated with lifestyle changes, medicines, or a combination of both. Lifestyle changes include losing weight, eating a healthy, low-sodium diet, exercising more, and limiting alcohol. This information is not intended to replace advice given to you by your health care provider. Make sure you discuss any questions you have with your health care provider. Document Revised: 07/05/2018 Document Reviewed: 07/05/2018 Elsevier Patient Education  [place].  Palpitations Palpitations are feelings that your heartbeat is irregular or is faster than normal. It may feel like your heart is fluttering or skipping a beat. Palpitations are usually not a serious problem. They may be caused by many things, including smoking, caffeine, alcohol, stress, and certain medicines or drugs. Most causes of palpitations are not serious. However, some palpitations can be a sign of a serious problem. You may need further tests to rule out serious medical problems. Follow these instructions at home:     Pay attention to any changes in your condition. Take these actions to help manage your symptoms: Eating and drinking  Avoid foods and drinks that may cause palpitations. These may include: ? Caffeinated coffee, tea, soft drinks, diet pills, and energy drinks. ? Chocolate. ? Alcohol. Lifestyle  Take steps to reduce your stress and anxiety. Things that can help you relax include: ? Yoga. ? Mind-body activities, such as deep breathing, meditation, or using words and images to create positive thoughts (guided imagery). ? Physical activity, such as swimming, jogging, or walking. Tell your health care provider if your palpitations increase with activity. If you have chest pain or shortness of breath with activity, do not continue the activity until you are seen by your health care provider. ? Biofeedback. This is a method that helps you learn to use your mind to control  things in your body, such as your heartbeat.  Do not use drugs, including cocaine or ecstasy. Do not use marijuana.  Get plenty of rest and sleep. Keep a regular bed time. General instructions  Take over-the-counter and prescription medicines only as told by your health care provider.  Do not use any products that contain nicotine or tobacco, such as cigarettes and e-cigarettes. If you need help quitting, ask your health care provider.  Keep all follow-up visits as told by your health care provider. This is important. These may include visits for further testing if palpitations do not go away or get worse. Contact a health care provider if you:  Continue to have a fast or irregular heartbeat after 24 hours.  Notice that your palpitations occur more often. Get help right  away if you:  Have chest pain or shortness of breath.  Have a severe headache.  Feel dizzy or you faint. Summary  Palpitations are feelings that your heartbeat is irregular or is faster than normal. It may feel like your heart is fluttering or skipping a beat.  Palpitations may be caused by many things, including smoking, caffeine, alcohol, stress, certain medicines, and drugs.  Although most causes of palpitations are not serious, some causes can be a sign of a serious medical problem.  Get help right away if you faint or have chest pain, shortness of breath, a severe headache, or dizziness. This information is not intended to replace advice given to you by your health care provider. Make sure you discuss any questions you have with your health care provider. Document Revised: 12/07/2017 Document Reviewed: 12/07/2017 Elsevier Patient Education  2020 [employer].

## 2020-03-03 NOTE — Assessment & Plan Note (Signed)
Well controlled, no changes to meds. Encouraged heart healthy diet such as the DASH diet and exercise as tolerated.  °

## 2020-03-10 ENCOUNTER — Ambulatory Visit (HOSPITAL_BASED_OUTPATIENT_CLINIC_OR_DEPARTMENT_OTHER)
Admission: RE | Admit: 2020-03-10 | Discharge: 2020-03-10 | Disposition: A | Discharge status: Home / Self Care | Payer: Medicare Other | Source: Ambulatory Visit | Attending: Family Medicine | Admitting: Family Medicine

## 2020-03-10 ENCOUNTER — Encounter: Payer: Self-pay | Admitting: Family Medicine

## 2020-03-10 ENCOUNTER — Other Ambulatory Visit: Payer: Self-pay

## 2020-03-10 DIAGNOSIS — R002 Palpitations: Secondary | ICD-10-CM

## 2020-03-10 DIAGNOSIS — R55 Syncope and collapse: Secondary | ICD-10-CM | POA: Diagnosis not present

## 2020-03-10 NOTE — Progress Notes (Signed)
  Echocardiogram 2D Echocardiogram has been performed.  Tammy Grimes 03/10/2020, 2:40 PM

## 2020-03-11 ENCOUNTER — Other Ambulatory Visit: Payer: Self-pay | Admitting: Family Medicine

## 2020-03-11 DIAGNOSIS — I5189 Other ill-defined heart diseases: Secondary | ICD-10-CM

## 2020-03-18 ENCOUNTER — Encounter: Payer: Self-pay | Admitting: *Deleted

## 2020-03-22 ENCOUNTER — Other Ambulatory Visit: Payer: Self-pay | Admitting: Family Medicine

## 2020-03-27 ENCOUNTER — Other Ambulatory Visit: Payer: Self-pay | Admitting: Family Medicine

## 2020-04-06 ENCOUNTER — Other Ambulatory Visit: Payer: Self-pay | Admitting: Family Medicine

## 2020-04-15 ENCOUNTER — Telehealth (HOSPITAL_COMMUNITY): Payer: Self-pay | Admitting: *Deleted

## 2020-04-15 NOTE — Telephone Encounter (Signed)
Attempted to call patient regarding upcoming cardiac MRI appointment. Left message on voicemail with name and callback number  [place] RN Navigator [facility] [phone] Office [phone] Cell

## 2020-04-15 NOTE — Telephone Encounter (Signed)
Pt returning call regarding upcoming cardiac imaging study; pt verbalizes understanding of appt date/time, parking situation and where to check in,and verified current allergies; name and call back number provided for further questions should they arise  Burley Saver RN [facility] [phone] office [phone] cell  Pt denies claustrophobia.

## 2020-04-16 ENCOUNTER — Other Ambulatory Visit: Payer: Self-pay

## 2020-04-16 ENCOUNTER — Ambulatory Visit (HOSPITAL_COMMUNITY)
Admission: RE | Admit: 2020-04-16 | Discharge: 2020-04-16 | Disposition: A | Discharge status: Home / Self Care | Payer: Medicare Other | Source: Ambulatory Visit | Attending: Family Medicine | Admitting: Family Medicine

## 2020-04-16 DIAGNOSIS — I5189 Other ill-defined heart diseases: Secondary | ICD-10-CM | POA: Diagnosis not present

## 2020-04-16 DIAGNOSIS — I313 Pericardial effusion (noninflammatory): Secondary | ICD-10-CM | POA: Diagnosis not present

## 2020-04-16 MED ORDER — GADOBUTROL 1 MMOL/ML IV SOLN
7.0000 mL | Freq: Once | INTRAVENOUS | Status: AC | PRN
  Administered 2020-04-16: 7 mL via INTRAVENOUS

## 2020-04-21 ENCOUNTER — Other Ambulatory Visit: Payer: Self-pay | Admitting: Oncology

## 2020-04-23 ENCOUNTER — Other Ambulatory Visit: Payer: Self-pay | Admitting: *Deleted

## 2020-04-23 MED ORDER — METOPROLOL TARTRATE 25 MG PO TABS: ORAL_TABLET | ORAL | 1 refills | Status: DC

## 2020-05-20 DIAGNOSIS — Z961 Presence of intraocular lens: Secondary | ICD-10-CM | POA: Diagnosis not present

## 2020-06-24 DIAGNOSIS — L820 Inflamed seborrheic keratosis: Secondary | ICD-10-CM | POA: Diagnosis not present

## 2020-07-22 ENCOUNTER — Other Ambulatory Visit: Payer: Self-pay

## 2020-07-22 ENCOUNTER — Ambulatory Visit: Payer: Medicare Other | Admitting: *Deleted

## 2020-07-22 ENCOUNTER — Ambulatory Visit (INDEPENDENT_AMBULATORY_CARE_PROVIDER_SITE_OTHER): Payer: Medicare Other | Admitting: Family Medicine

## 2020-07-22 VITALS — BP 126/70 | HR 65 | Temp 97.8°F | Resp 13 | Ht 61.0 in | Wt 130.0 lb

## 2020-07-22 DIAGNOSIS — M545 Low back pain, unspecified: Secondary | ICD-10-CM | POA: Insufficient documentation

## 2020-07-22 DIAGNOSIS — Z780 Asymptomatic menopausal state: Secondary | ICD-10-CM

## 2020-07-22 DIAGNOSIS — Z170 Estrogen receptor positive status [ER+]: Secondary | ICD-10-CM

## 2020-07-22 DIAGNOSIS — E559 Vitamin D deficiency, unspecified: Secondary | ICD-10-CM | POA: Diagnosis not present

## 2020-07-22 DIAGNOSIS — M79605 Pain in left leg: Secondary | ICD-10-CM

## 2020-07-22 DIAGNOSIS — R739 Hyperglycemia, unspecified: Secondary | ICD-10-CM | POA: Diagnosis not present

## 2020-07-22 DIAGNOSIS — E079 Disorder of thyroid, unspecified: Secondary | ICD-10-CM

## 2020-07-22 DIAGNOSIS — E782 Mixed hyperlipidemia: Secondary | ICD-10-CM | POA: Diagnosis not present

## 2020-07-22 DIAGNOSIS — M8580 Other specified disorders of bone density and structure, unspecified site: Secondary | ICD-10-CM

## 2020-07-22 DIAGNOSIS — R002 Palpitations: Secondary | ICD-10-CM

## 2020-07-22 DIAGNOSIS — C50511 Malignant neoplasm of lower-outer quadrant of right female breast: Secondary | ICD-10-CM

## 2020-07-22 DIAGNOSIS — E2839 Other primary ovarian failure: Secondary | ICD-10-CM

## 2020-07-22 DIAGNOSIS — I10 Essential (primary) hypertension: Secondary | ICD-10-CM

## 2020-07-22 NOTE — Assessment & Plan Note (Signed)
No recent episode and work up was negative

## 2020-07-22 NOTE — Progress Notes (Signed)
Subjective:    Patient ID: Tammy Grimes, female    DOB: 1946/02/02, 74 y.o.   MRN: [id number]  Chief Complaint  Patient presents with  . 20 week followup    HPI Patient is in today for follow up on chronic medical concerns. No recent febrile illness or hospitalizations. No recent palpitations and her previous episode resulted in echo and MRI which were reassuring. No polyuria or polydipsia. She received and tolerated her Shingrix shots in June and August. Denies CP/palp/SOB/HA/congestion/fevers/GI or GU c/o. Taking meds as prescribed. Still struggles with some low back pain with radicular pain to left hip at times  Past Medical History:  Diagnosis Date  . Allergy   . Anemia    prior to hysterectomy  . BCC (basal cell carcinoma) 08/01/2015  . Breast cancer ([place]) 09/25/2018   right  . Cancer ([place]) 2007   calf of right leg- melanoma  . Cataract   . Cervical cancer screening 06/06/2012  . DDD (degenerative disc disease), cervical 08/01/2015  . Family history of bladder cancer   . Family history of breast cancer   . Family history of colon cancer   . Family history of ovarian cancer   . Family history of prostate cancer   . GERD (gastroesophageal reflux disease)   . Hyperglycemia 03/28/2017  . Hyperlipidemia   . Hypertension 65  . Hypothyroidism   . Knee pain 03/30/2012   L>R  . Low back pain 03/23/2018  . Melanoma of skin ([place]) 08/19/2016  . PONV (postoperative nausea and vomiting)   . Thyroid disease 64  . Vitamin D deficiency 06/06/2012    Past Surgical History:  Procedure Laterality Date  . ABDOMINAL HYSTERECTOMY  1990's   total, for heavy bleeding and fibroids  . BASAL CELL CARCINOMA EXCISION    . BREAST LUMPECTOMY WITH RADIOACTIVE SEED AND SENTINEL LYMPH NODE BIOPSY Right 11/07/2018   Procedure: RIGHT BREAST LUMPECTOMY WITH RADIOACTIVE SEED AND SENTINEL LYMPH NODE BIOPSY;  Surgeon: [name], MD;  Location: [facility];  Service: General;   Laterality: Right;  . BREAST SURGERY  early 70's   fibroid tumors removed- benign  . CATARACT EXTRACTION Bilateral 12/09/12  . COLONOSCOPY    . EYE SURGERY Bilateral 2015   cataracts  . MELANOMA EXCISION  2007    Family History  Problem Relation Age of Onset  . Hypertension Mother   . Alzheimer's disease Mother   . Dementia Mother        alzheimer's  . Aortic aneurysm Father   . Hypertension Father   . COPD Father        smoker  . Heart disease Father        s/p bypass, aortic aneurysm, carotid artery disease  . Breast cancer Sister 62  . Diabetes Son 22       type 1  . Alzheimer's disease Maternal Grandmother   . Prostate cancer Maternal Grandfather 60       metastatic  . Stroke Paternal Grandfather   . Ovarian cancer Maternal Aunt 75  . Cancer Maternal Aunt        cervical or uterine  . Colon cancer Paternal Uncle 67  . Breast cancer Cousin 86  . Breast cancer Cousin 75  . Colon cancer Cousin        dx >50  . Esophageal cancer Neg Hx   . Pancreatic cancer Neg Hx   . Rectal cancer Neg Hx   . Stomach cancer Neg Hx  Social History   Socioeconomic History  . Marital status: Married    Spouse name: Not on file  . Number of children: Not on file  . Years of education: Not on file  . Highest education level: Not on file  Occupational History  . Not on file  Tobacco Use  . Smoking status: Never Smoker  . Smokeless tobacco: Never Used  Vaping Use  . Vaping Use: Never used  Substance and Sexual Activity  . Alcohol use: No  . Drug use: No  . Sexual activity: Yes    Comment: lives with husband, retired from [employer] work, no dietary restrictions  Other Topics Concern  . Not on file  Social History Narrative  . Not on file   Social Determinants of Health   Financial Resource Strain:   . Difficulty of Paying Living Expenses: Not on file  Food Insecurity:   . Worried About Charity fundraiser in the Last Year: Not on file  . Ran Out of Food in the  Last Year: Not on file  Transportation Needs:   . Lack of Transportation (Medical): Not on file  . Lack of Transportation (Non-Medical): Not on file  Physical Activity:   . Days of Exercise per Week: Not on file  . Minutes of Exercise per Session: Not on file  Stress:   . Feeling of Stress : Not on file  Social Connections:   . Frequency of Communication with Friends and Family: Not on file  . Frequency of Social Gatherings with Friends and Family: Not on file  . Attends Religious Services: Not on file  . Active Member of Clubs or Organizations: Not on file  . Attends Archivist Meetings: Not on file  . Marital Status: Not on file  Intimate Partner Violence:   . Fear of Current or Ex-Partner: Not on file  . Emotionally Abused: Not on file  . Physically Abused: Not on file  . Sexually Abused: Not on file    Outpatient Medications Prior to Visit  Medication Sig Dispense Refill  . amLODipine (NORVASC) 5 MG tablet TAKE 1 TABLET BY MOUTH AT  BEDTIME 90 tablet 3  . aspirin 81 MG tablet Take 81 mg by mouth daily.    . calcium-vitamin D 250-100 MG-UNIT per tablet Take 1 tablet by mouth daily.    Marland Kitchen levothyroxine (SYNTHROID) 75 MCG tablet TAKE 1 TABLET BY MOUTH  DAILY 90 tablet 3  . metoprolol tartrate (LOPRESSOR) 25 MG tablet TAKE 1/2 TABLET(12.5 MG) BY MOUTH TWICE DAILY 90 tablet 1  . Multiple Vitamins-Minerals (CENTRUM SILVER 50+WOMEN) TABS Use as directed    . Omega-3 Fatty Acids (OMEGA 3 500) 500 MG CAPS Take 1 capsule by mouth daily.    Marland Kitchen omeprazole (PRILOSEC) 20 MG capsule TAKE 1 CAPSULE BY MOUTH  DAILY 90 capsule 1  . rosuvastatin (CRESTOR) 5 MG tablet TAKE 1 TABLET BY MOUTH  DAILY 90 tablet 3  . tamoxifen (NOLVADEX) 20 MG tablet TAKE 1 TABLET BY MOUTH  DAILY 90 tablet 3  . TURMERIC CURCUMIN PO Take 1,650 mg by mouth daily.    . Probiotic Product (PROBIOTIC DAILY) CAPS Take 1 tablet by mouth daily.     No facility-administered medications prior to visit.    Allergies    Allergen Reactions  . Penicillins Rash    Review of Systems  Constitutional: Negative for fever and malaise/fatigue.  HENT: Negative for congestion.   Eyes: Negative for blurred vision.  Respiratory: Negative for  shortness of breath.   Cardiovascular: Negative for chest pain, palpitations and leg swelling.  Gastrointestinal: Negative for abdominal pain, blood in stool and nausea.  Genitourinary: Negative for dysuria and frequency.  Musculoskeletal: Positive for back pain. Negative for falls.  Skin: Negative for rash.  Neurological: Negative for dizziness, loss of consciousness and headaches.  Endo/Heme/Allergies: Negative for environmental allergies.  Psychiatric/Behavioral: Negative for depression. The patient is not nervous/anxious.        Objective:    Physical Exam Vitals and nursing note reviewed.  Constitutional:      General: She is not in acute distress.    Appearance: She is well-developed.  HENT:     Head: Normocephalic and atraumatic.     Nose: Nose normal.  Eyes:     General:        Right eye: No discharge.        Left eye: No discharge.  Cardiovascular:     Rate and Rhythm: Normal rate and regular rhythm.     Heart sounds: No murmur heard.   Pulmonary:     Effort: Pulmonary effort is normal.     Breath sounds: Normal breath sounds.  Abdominal:     General: Bowel sounds are normal.     Palpations: Abdomen is soft.     Tenderness: There is no abdominal tenderness.  Musculoskeletal:     Cervical back: Normal range of motion and neck supple.  Skin:    General: Skin is warm and dry.  Neurological:     Mental Status: She is alert and oriented to person, place, and time.     BP 126/70 (BP Location: Left Arm, Patient Position: Sitting, Cuff Size: Large)   Pulse 65   Temp 97.8 F (36.6 C) (Oral)   Resp 13   Ht 5\' 1"  (1.549 m)   Wt 130 lb (59 kg)   SpO2 100%   BMI 24.56 kg/m  Wt Readings from Last 3 Encounters:  07/22/20 130 lb (59 kg)  03/03/20  130 lb 3.2 oz (59.1 kg)  11/29/19 132 lb (59.9 kg)    Diabetic Foot Exam - Simple   No data filed     Lab Results  Component Value Date   WBC 7.0 03/03/2020   HGB 12.1 03/03/2020   HCT 36.6 03/03/2020   PLT 226.0 03/03/2020   GLUCOSE 104 (H) 03/03/2020   CHOL 102 03/03/2020   TRIG 162.0 (H) 03/03/2020   HDL 32.30 (L) 03/03/2020   LDLDIRECT 152.0 08/01/2015   LDLCALC 38 03/03/2020   ALT 31 03/03/2020   AST 24 03/03/2020   NA 138 03/03/2020   K 4.6 03/03/2020   CL 103 03/03/2020   CREATININE 0.64 03/03/2020   BUN 17 03/03/2020   CO2 26 03/03/2020   TSH 3.94 03/03/2020   HGBA1C 5.8 03/03/2020    Lab Results  Component Value Date   TSH 3.94 03/03/2020   Lab Results  Component Value Date   WBC 7.0 03/03/2020   HGB 12.1 03/03/2020   HCT 36.6 03/03/2020   MCV 95.1 03/03/2020   PLT 226.0 03/03/2020   Lab Results  Component Value Date   NA 138 03/03/2020   K 4.6 03/03/2020   CO2 26 03/03/2020   GLUCOSE 104 (H) 03/03/2020   BUN 17 03/03/2020   CREATININE 0.64 03/03/2020   BILITOT 0.4 03/03/2020   ALKPHOS 48 03/03/2020   AST 24 03/03/2020   ALT 31 03/03/2020   PROT 6.7 03/03/2020   ALBUMIN 4.2 03/03/2020  CALCIUM 9.3 03/03/2020   ANIONGAP 11 09/18/2019   GFR 91.15 03/03/2020   Lab Results  Component Value Date   CHOL 102 03/03/2020   Lab Results  Component Value Date   HDL 32.30 (L) 03/03/2020   Lab Results  Component Value Date   LDLCALC 38 03/03/2020   Lab Results  Component Value Date   TRIG 162.0 (H) 03/03/2020   Lab Results  Component Value Date   CHOLHDL 3 03/03/2020   Lab Results  Component Value Date   HGBA1C 5.8 03/03/2020       Assessment & Plan:   Problem List Items Addressed This Visit    Thyroid disease    On Levothyroxine, continue to monitor      Essential hypertension    Well controlled, no changes to meds. Encouraged heart healthy diet such as the DASH diet and exercise as tolerated.       Hyperlipidemia     Encouraged heart healthy diet, increase exercise, avoid trans fats, consider a krill oil cap daily      Osteopenia    Encouraged to get adequate exercise, calcium and vitamin d intake      Vitamin D deficiency    Supplement and monitor      Hyperglycemia    hgba1c acceptable, minimize simple carbs. Increase exercise as tolerated.       Malignant neoplasm of lower-outer quadrant of right breast of female, estrogen receptor positive ([place])    Surgery 2019 and her taste has never been quite the same. Some things taste different      Palpitations    No recent episode and work up was negative      Low back pain radiating to left leg    Pain radiates to left hip and she notes weakness and pain in both knees that pop out of joint at times. She wears a brace. She will consider another round of PT again, has done chiropractic in past as well.          I have discontinued Madolyn Ackroyd. Soule "[name]"'s Probiotic Daily. I am also having her maintain her aspirin, calcium-vitamin D, Centrum Silver 50+Women, rosuvastatin, TURMERIC CURCUMIN PO, Omega 3 500, amLODipine, omeprazole, levothyroxine, tamoxifen, and metoprolol tartrate.  No orders of the defined types were placed in this encounter.    Penni Homans, MD

## 2020-07-22 NOTE — Assessment & Plan Note (Signed)
Well controlled, no changes to meds. Encouraged heart healthy diet such as the DASH diet and exercise as tolerated.  °

## 2020-07-22 NOTE — Assessment & Plan Note (Signed)
Surgery 2019 and her taste has never been quite the same. Some things taste different

## 2020-07-22 NOTE — Assessment & Plan Note (Signed)
Encouraged to get adequate exercise, calcium and vitamin d intake 

## 2020-07-22 NOTE — Assessment & Plan Note (Signed)
On Levothyroxine, continue to monitor 

## 2020-07-22 NOTE — Assessment & Plan Note (Signed)
Pain radiates to left hip and she notes weakness and pain in both knees that pop out of joint at times. She wears a brace. She will consider another round of PT again, has done chiropractic in past as well.

## 2020-07-22 NOTE — Patient Instructions (Signed)
Hypothyroidism  Hypothyroidism is when the thyroid gland does not make enough of certain hormones (it is underactive). The thyroid gland is a small gland located in the lower front part of the neck, just in front of the windpipe (trachea). This gland makes hormones that help control how the body uses food for energy (metabolism) as well as how the heart and brain function. These hormones also play a role in keeping your bones strong. When the thyroid is underactive, it produces too little of the hormones thyroxine (T4) and triiodothyronine (T3). What are the causes? This condition may be caused by:  Hashimoto's disease. This is a disease in which the body's disease-fighting system (immune system) attacks the thyroid gland. This is the most common cause.  Viral infections.  Pregnancy.  Certain medicines.  Birth defects.  Past radiation treatments to the head or neck for cancer.  Past treatment with radioactive iodine.  Past exposure to radiation in the environment.  Past surgical removal of part or all of the thyroid.  Problems with a gland in the center of the brain (pituitary gland).  Lack of enough iodine in the diet. What increases the risk? You are more likely to develop this condition if:  You are female.  You have a family history of thyroid conditions.  You use a medicine called lithium.  You take medicines that affect the immune system (immunosuppressants). What are the signs or symptoms? Symptoms of this condition include:  Feeling as though you have no energy (lethargy).  Not being able to tolerate cold.  Weight gain that is not explained by a change in diet or exercise habits.  Lack of appetite.  Dry skin.  Coarse hair.  Menstrual irregularity.  Slowing of thought processes.  Constipation.  Sadness or depression. How is this diagnosed? This condition may be diagnosed based on:  Your symptoms, your medical history, and a physical exam.  Blood  tests. You may also have imaging tests, such as an ultrasound or MRI. How is this treated? This condition is treated with medicine that replaces the thyroid hormones that your body does not make. After you begin treatment, it may take several weeks for symptoms to go away. Follow these instructions at home:  Take over-the-counter and prescription medicines only as told by your health care provider.  If you start taking any new medicines, tell your health care provider.  Keep all follow-up visits as told by your health care provider. This is important. ? As your condition improves, your dosage of thyroid hormone medicine may change. ? You will need to have blood tests regularly so that your health care provider can monitor your condition. Contact a health care provider if:  Your symptoms do not get better with treatment.  You are taking thyroid replacement medicine and you: ? Sweat a lot. ? Have tremors. ? Feel anxious. ? Lose weight rapidly. ? Cannot tolerate heat. ? Have emotional swings. ? Have diarrhea. ? Feel weak. Get help right away if you have:  Chest pain.  An irregular heartbeat.  A rapid heartbeat.  Difficulty breathing. Summary  Hypothyroidism is when the thyroid gland does not make enough of certain hormones (it is underactive).  When the thyroid is underactive, it produces too little of the hormones thyroxine (T4) and triiodothyronine (T3).  The most common cause is Hashimoto's disease, a disease in which the body's disease-fighting system (immune system) attacks the thyroid gland. The condition can also be caused by viral infections, medicine, pregnancy, or past   radiation treatment to the head or neck.  Symptoms may include weight gain, dry skin, constipation, feeling as though you do not have energy, and not being able to tolerate cold.  This condition is treated with medicine to replace the thyroid hormones that your body does not make. This information  is not intended to replace advice given to you by your health care provider. Make sure you discuss any questions you have with your health care provider. Document Revised: 10/07/2017 Document Reviewed: 10/05/2017 Elsevier Patient Education  2020 Elsevier Inc.  

## 2020-07-22 NOTE — Assessment & Plan Note (Signed)
Encouraged heart healthy diet, increase exercise, avoid trans fats, consider a krill oil cap daily 

## 2020-07-22 NOTE — Assessment & Plan Note (Signed)
Supplement and monitor 

## 2020-07-22 NOTE — Assessment & Plan Note (Signed)
hgba1c acceptable, minimize simple carbs. Increase exercise as tolerated.  

## 2020-07-23 LAB — COMPREHENSIVE METABOLIC PANEL
AG Ratio: 2.1 (calc) (ref 1.0–2.5)
ALT: 38 U/L — ABNORMAL HIGH (ref 6–29)
AST: 35 U/L (ref 10–35)
Albumin: 4.4 g/dL (ref 3.6–5.1)
Alkaline phosphatase (APISO): 44 U/L (ref 37–153)
BUN: 16 mg/dL (ref 7–25)
CO2: 25 mmol/L (ref 20–32)
Calcium: 9.5 mg/dL (ref 8.6–10.4)
Chloride: 105 mmol/L (ref 98–110)
Creat: 0.63 mg/dL (ref 0.60–0.93)
Globulin: 2.1 g/dL (calc) (ref 1.9–3.7)
Glucose, Bld: 113 mg/dL — ABNORMAL HIGH (ref 65–99)
Potassium: 4.4 mmol/L (ref 3.5–5.3)
Sodium: 140 mmol/L (ref 135–146)
Total Bilirubin: 0.5 mg/dL (ref 0.2–1.2)
Total Protein: 6.5 g/dL (ref 6.1–8.1)

## 2020-07-23 LAB — LIPID PANEL
Cholesterol: 126 mg/dL (ref <200)
HDL: 44 mg/dL — ABNORMAL LOW (ref >=50)
LDL Cholesterol (Calc): 57 mg/dL (calc)
Non-HDL Cholesterol (Calc): 82 mg/dL (calc) (ref <130)
Total CHOL/HDL Ratio: 2.9 (calc) (ref <5.0)
Triglycerides: 169 mg/dL — ABNORMAL HIGH (ref <150)

## 2020-07-23 LAB — CBC
HCT: 37.3 % (ref 35.0–45.0)
Hemoglobin: 12.4 g/dL (ref 11.7–15.5)
MCH: 32 pg (ref 27.0–33.0)
MCHC: 33.2 g/dL (ref 32.0–36.0)
MCV: 96.1 fL (ref 80.0–100.0)
MPV: 12.5 fL (ref 7.5–12.5)
Platelets: 162 10*3/uL (ref 140–400)
RBC: 3.88 10*6/uL (ref 3.80–5.10)
RDW: 12 % (ref 11.0–15.0)
WBC: 5.7 10*3/uL (ref 3.8–10.8)

## 2020-07-23 LAB — HEMOGLOBIN A1C
Hgb A1c MFr Bld: 5.8 % of total Hgb — ABNORMAL HIGH (ref <5.7)
Mean Plasma Glucose: 120 (calc)
eAG (mmol/L): 6.6 (calc)

## 2020-07-23 LAB — TSH: TSH: 2.23 mIU/L (ref 0.40–4.50)

## 2020-07-23 LAB — VITAMIN D 25 HYDROXY (VIT D DEFICIENCY, FRACTURES): Vit D, 25-Hydroxy: 48 ng/mL (ref 30–100)

## 2020-08-12 ENCOUNTER — Encounter: Payer: Self-pay | Admitting: Family Medicine

## 2020-08-12 NOTE — Telephone Encounter (Signed)
Order re-faxed to [facility] fax- [phone]

## 2020-08-15 DIAGNOSIS — M8589 Other specified disorders of bone density and structure, multiple sites: Secondary | ICD-10-CM | POA: Diagnosis not present

## 2020-08-15 LAB — HM DEXA SCAN

## 2020-09-09 ENCOUNTER — Other Ambulatory Visit: Payer: Self-pay | Admitting: Family Medicine

## 2020-09-15 DIAGNOSIS — Z853 Personal history of malignant neoplasm of breast: Secondary | ICD-10-CM | POA: Diagnosis not present

## 2020-09-15 LAB — HM MAMMOGRAPHY

## 2020-09-18 ENCOUNTER — Other Ambulatory Visit: Payer: Self-pay

## 2020-09-18 ENCOUNTER — Inpatient Hospital Stay: Payer: Medicare Other | Attending: Oncology | Admitting: Oncology

## 2020-09-18 ENCOUNTER — Inpatient Hospital Stay: Payer: Medicare Other

## 2020-09-18 VITALS — BP 142/59 | HR 66 | Temp 98.5°F | Resp 17 | Ht 61.0 in | Wt 127.8 lb

## 2020-09-18 DIAGNOSIS — E785 Hyperlipidemia, unspecified: Secondary | ICD-10-CM | POA: Insufficient documentation

## 2020-09-18 DIAGNOSIS — Z170 Estrogen receptor positive status [ER+]: Secondary | ICD-10-CM | POA: Insufficient documentation

## 2020-09-18 DIAGNOSIS — Z923 Personal history of irradiation: Secondary | ICD-10-CM | POA: Diagnosis not present

## 2020-09-18 DIAGNOSIS — M8580 Other specified disorders of bone density and structure, unspecified site: Secondary | ICD-10-CM | POA: Insufficient documentation

## 2020-09-18 DIAGNOSIS — E039 Hypothyroidism, unspecified: Secondary | ICD-10-CM | POA: Diagnosis not present

## 2020-09-18 DIAGNOSIS — Z79899 Other long term (current) drug therapy: Secondary | ICD-10-CM | POA: Diagnosis not present

## 2020-09-18 DIAGNOSIS — Z8042 Family history of malignant neoplasm of prostate: Secondary | ICD-10-CM | POA: Insufficient documentation

## 2020-09-18 DIAGNOSIS — C50211 Malignant neoplasm of upper-inner quadrant of right female breast: Secondary | ICD-10-CM | POA: Diagnosis not present

## 2020-09-18 DIAGNOSIS — Z800 Family history of malignant neoplasm of digestive organs: Secondary | ICD-10-CM | POA: Diagnosis not present

## 2020-09-18 DIAGNOSIS — Z7982 Long term (current) use of aspirin: Secondary | ICD-10-CM | POA: Diagnosis not present

## 2020-09-18 DIAGNOSIS — Z803 Family history of malignant neoplasm of breast: Secondary | ICD-10-CM | POA: Insufficient documentation

## 2020-09-18 DIAGNOSIS — Z85820 Personal history of malignant melanoma of skin: Secondary | ICD-10-CM | POA: Insufficient documentation

## 2020-09-18 DIAGNOSIS — Z8041 Family history of malignant neoplasm of ovary: Secondary | ICD-10-CM | POA: Insufficient documentation

## 2020-09-18 DIAGNOSIS — R232 Flushing: Secondary | ICD-10-CM | POA: Insufficient documentation

## 2020-09-18 DIAGNOSIS — Z8379 Family history of other diseases of the digestive system: Secondary | ICD-10-CM | POA: Diagnosis not present

## 2020-09-18 DIAGNOSIS — Z8052 Family history of malignant neoplasm of bladder: Secondary | ICD-10-CM | POA: Insufficient documentation

## 2020-09-18 DIAGNOSIS — E559 Vitamin D deficiency, unspecified: Secondary | ICD-10-CM | POA: Insufficient documentation

## 2020-09-18 DIAGNOSIS — Z79811 Long term (current) use of aromatase inhibitors: Secondary | ICD-10-CM | POA: Diagnosis not present

## 2020-09-18 DIAGNOSIS — C50511 Malignant neoplasm of lower-outer quadrant of right female breast: Secondary | ICD-10-CM

## 2020-09-18 DIAGNOSIS — Z9221 Personal history of antineoplastic chemotherapy: Secondary | ICD-10-CM | POA: Diagnosis not present

## 2020-09-18 DIAGNOSIS — I10 Essential (primary) hypertension: Secondary | ICD-10-CM | POA: Diagnosis not present

## 2020-09-18 LAB — COMPREHENSIVE METABOLIC PANEL
ALT: 44 U/L (ref 0–44)
AST: 40 U/L (ref 15–41)
Albumin: 4.2 g/dL (ref 3.5–5.0)
Alkaline Phosphatase: 50 U/L (ref 38–126)
Anion gap: 9 (ref 5–15)
BUN: 15 mg/dL (ref 8–23)
CO2: 25 mmol/L (ref 22–32)
Calcium: 9.5 mg/dL (ref 8.9–10.3)
Chloride: 106 mmol/L (ref 98–111)
Creatinine, Ser: 0.77 mg/dL (ref 0.44–1.00)
GFR, Estimated: >60 mL/min (ref >60)
Glucose, Bld: 90 mg/dL (ref 70–99)
Potassium: 4.3 mmol/L (ref 3.5–5.1)
Sodium: 140 mmol/L (ref 135–145)
Total Bilirubin: 0.7 mg/dL (ref 0.3–1.2)
Total Protein: 7.1 g/dL (ref 6.5–8.1)

## 2020-09-18 LAB — CBC WITH DIFFERENTIAL/PLATELET
Abs Immature Granulocytes: 0.01 10*3/uL (ref 0.00–0.07)
Basophils Absolute: 0.1 10*3/uL (ref 0.0–0.1)
Basophils Relative: 1 %
Eosinophils Absolute: 0.2 10*3/uL (ref 0.0–0.5)
Eosinophils Relative: 3 %
HCT: 39.4 % (ref 36.0–46.0)
Hemoglobin: 13 g/dL (ref 12.0–15.0)
Immature Granulocytes: 0 %
Lymphocytes Relative: 29 %
Lymphs Abs: 1.8 10*3/uL (ref 0.7–4.0)
MCH: 31.9 pg (ref 26.0–34.0)
MCHC: 33 g/dL (ref 30.0–36.0)
MCV: 96.6 fL (ref 80.0–100.0)
Monocytes Absolute: 0.5 10*3/uL (ref 0.1–1.0)
Monocytes Relative: 8 %
Neutro Abs: 3.9 10*3/uL (ref 1.7–7.7)
Neutrophils Relative %: 59 %
Platelets: 168 10*3/uL (ref 150–400)
RBC: 4.08 MIL/uL (ref 3.87–5.11)
RDW: 12.1 % (ref 11.5–15.5)
WBC: 6.4 10*3/uL (ref 4.0–10.5)
nRBC: 0 % (ref 0.0–0.2)

## 2020-09-18 NOTE — Progress Notes (Signed)
[place]  Telephone:([phone]) [phone] [phone]) [phone]    ID: Tammy Grimes DOB: Jun 06, 1946  MR#: [id number]  EGB#:[id number]  Patient Care Team: [name], MD as PCP - General (Family Medicine) Martinique, Amy, MD as Consulting Physician (Dermatology) Rutherford Guys, MD as Consulting Physician (Ophthalmology) [name], MD as Consulting Physician (General Surgery) Aaryan Essman, Virgie Dad, MD as Consulting Physician (Oncology) Kyung Rudd, MD as Consulting Physician (Radiation Oncology) Gatha Mayer, MD as Consulting Physician (Gastroenterology) Mauro Kaufmann, RN as Oncology Nurse Navigator Rockwell Germany, RN as Oncology Nurse Navigator OTHER MD: Dr. Purcell Nails, Dr. Sharyon Medicus DDS PA   CHIEF COMPLAINT: Estrogen receptor positive breast cancer  CURRENT TREATMENT: Tamoxifen   INTERVAL HISTORY: Tammy Grimes returns today for follow-up of her estrogen receptor positive breast cancer.   She continues on tamoxifen.  She generally tolerates this without any significant side effects other than hot flashes.  These have improved sufficiently that she does not think she needs anything for them.    Since her last visit, she underwent bone density screening on 08/15/2020 showing a T-score of -1.3, which is considered osteopenic prior in the same areas was -1.0)..  She also underwent bilateral diagnostic mammography with tomography at [facility] on 09/15/2020 showing: breast density category C; no evidence of malignancy in either breast.    REVIEW OF SYSTEMS: Tammy Grimes is doing stretching and walking chiefly for exercises.  She has a little bit of low back pain sometimes.  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  A detailed review of systems was otherwise stable   COVID 19 VACCINATION STATUS: Status post Pfizer x2+ booster October 2021    HISTORY OF CURRENT ILLNESS: From the original intake note:  "Tammy Grimes"  had routine screening mammography on 09/11/2018 showing a possible abnormality in the right breast. She underwent right unilateral diagnostic mammography with tomography and right breast ultrasonography at [facility] on 09/21/2018 showing: Breast Density Category C. 1.3 cm irregular focal asymmetry in the right breast lower inner aspect anterior depth 4 cm from the nipple noted on the mammogram, but on the ultrasound this measured at [2.8] cm. The irregular mass is hypoechoic with posterior acoustic shadowing. Color flow imaging demonstrates increased vascularity. Elastography imaging assessment is hard. No significant abnormalities were seen in the right axilla.   Accordingly on 09/28/2018 she proceeded to biopsy of the right breast area in question. The pathology from this procedure showed ([id number]): invasive ductal carcinoma, Grade III. Prognostic indicators significant for: estrogen receptor, 95% positive and progesterone receptor, 95% positive, both with strong staining intensity. Proliferation marker Ki67 at 15%. HER2 negative immunohistochemcal and morphometric analysis 1+.  The patient's subsequent history is as detailed below.   PAST MEDICAL HISTORY: Past Medical History:  Diagnosis Date  . Allergy   . Anemia    prior to hysterectomy  . BCC (basal cell carcinoma) 08/01/2015  . Breast cancer ([place]) 09/25/2018   right  . Cancer ([place]) 2007   calf of right leg- melanoma  . Cataract   . Cervical cancer screening 06/06/2012  . DDD (degenerative disc disease), cervical 08/01/2015  . Family history of bladder cancer   . Family history of breast cancer   . Family history of colon cancer   . Family history of ovarian cancer   . Family history of prostate cancer   . GERD (gastroesophageal reflux disease)   . Hyperglycemia  03/28/2017  . Hyperlipidemia   . Hypertension 65  . Hypothyroidism   . Knee pain 03/30/2012   L>R  . Low back pain 03/23/2018  . Melanoma of skin ([place]) 08/19/2016  . PONV  (postoperative nausea and vomiting)   . Thyroid disease 64  . Vitamin D deficiency 06/06/2012    PAST SURGICAL HISTORY: Past Surgical History:  Procedure Laterality Date  . ABDOMINAL HYSTERECTOMY  1990's   total, for heavy bleeding and fibroids  . BASAL CELL CARCINOMA EXCISION    . BREAST LUMPECTOMY WITH RADIOACTIVE SEED AND SENTINEL LYMPH NODE BIOPSY Right 11/07/2018   Procedure: RIGHT BREAST LUMPECTOMY WITH RADIOACTIVE SEED AND SENTINEL LYMPH NODE BIOPSY;  Surgeon: [name], MD;  Location: [place];  Service: General;  Laterality: Right;  . BREAST SURGERY  early 70's   fibroid tumors removed- benign  . CATARACT EXTRACTION Bilateral 12/09/12  . COLONOSCOPY    . EYE SURGERY Bilateral 2015   cataracts  . MELANOMA EXCISION  2007    FAMILY HISTORY: Family History  Problem Relation Age of Onset  . Hypertension Mother   . Alzheimer's disease Mother   . Dementia Mother        alzheimer's  . Aortic aneurysm Father   . Hypertension Father   . COPD Father        smoker  . Heart disease Father        s/p bypass, aortic aneurysm, carotid artery disease  . Breast cancer Sister 36  . Diabetes Son 22       type 1  . Alzheimer's disease Maternal Grandmother   . Prostate cancer Maternal Grandfather 60       metastatic  . Stroke Paternal Grandfather   . Ovarian cancer Maternal Aunt 75  . Cancer Maternal Aunt        cervical or uterine  . Colon cancer Paternal Uncle 69  . Breast cancer Cousin 25  . Breast cancer Cousin 49  . Colon cancer Cousin        dx >50  . Esophageal cancer Neg Hx   . Pancreatic cancer Neg Hx   . Rectal cancer Neg Hx   . Stomach cancer Neg Hx    Her father died from COPD at age 33. Patients' mother died from alzheimer's at age 36. The patient has no brothers, 1 sister. Her sister, Tammy Grimes, had breast cancer at the age of 48.  Tammy Grimes, is employed with the [employer].One of their maternal aunts had ovarian cancer, and a  second maternal aunt had either uterine or cervical cancer. Tammy Grimes had Melanoma in situ on her right calf.   GYNECOLOGIC HISTORY:  Menarche: 74 years old Age at first live birth: 74 years old GX P: 1 LMP: No LMP recorded. Patient has had a hysterectomy. 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 Her husband, Tammy Grimes, worked as a English as a second language teacher for an Retail buyer.  He has now retired.  Tammy Grimes has one son, Tammy Grimes, age 71, who lives in [place] and is a Advertising copywriter as well as a Art gallery manager.     ADVANCED DIRECTIVES: Her husband, Tammy Grimes is Forensic scientist (MPOA). If Tammy Grimes is unable, then her  son, Tammy Grimes, would assume MPOA.    HEALTH MAINTENANCE: Social History   Tobacco Use  . Smoking status: Never Smoker  . Smokeless tobacco: Never Used  Vaping Use  . Vaping Use: Never used  Substance Use Topics  . Alcohol use: No  . Drug use: No    Colonoscopy: yes, 06/07/2017  PAP:   Bone density: yes, 07/25/2018   Allergies  Allergen Reactions  . Penicillins Rash    Current Outpatient Medications  Medication Sig Dispense Refill  . amLODipine (NORVASC) 5 MG tablet TAKE 1 TABLET BY MOUTH AT  BEDTIME 90 tablet 3  . aspirin 81 MG tablet Take 81 mg by mouth daily.    . calcium-vitamin D 250-100 MG-UNIT per tablet Take 1 tablet by mouth daily.    Marland Kitchen levothyroxine (SYNTHROID) 75 MCG tablet TAKE 1 TABLET BY MOUTH  DAILY 90 tablet 3  . metoprolol tartrate (LOPRESSOR) 25 MG tablet TAKE 1/2 TABLET(12.5 MG) BY MOUTH TWICE DAILY 90 tablet 1  . Multiple Vitamins-Minerals (CENTRUM SILVER 50+WOMEN) TABS Use as directed    . Omega-3 Fatty Acids (OMEGA 3 500) 500 MG CAPS Take 1 capsule by mouth daily.     Marland Kitchen omeprazole (PRILOSEC) 20 MG capsule TAKE 1 CAPSULE BY MOUTH  DAILY 90 capsule 3  . rosuvastatin (CRESTOR) 5 MG tablet TAKE 1 TABLET BY MOUTH  DAILY 90 tablet 3  . tamoxifen (NOLVADEX) 20 MG tablet TAKE 1 TABLET BY MOUTH  DAILY 90 tablet 3  . TURMERIC CURCUMIN PO Take 1,650 mg by mouth daily.     No current facility-administered medications for this visit.    OBJECTIVE: white woman in no acute distress  Vitals:   09/18/20 1303  BP: (!) 142/59  Pulse: 66  Resp: 17  Temp: 98.5 F (36.9 C)  SpO2: 98%     Body mass index is 24.15 kg/m.   Wt Readings from Last 3 Encounters:  09/18/20 127 lb 12.8 oz (58 kg)  07/22/20 130 lb (59 kg)  03/03/20 130 lb 3.2 oz (59.1 kg)      ECOG FS:1 - Symptomatic but completely ambulatory  Sclerae unicteric, EOMs intact Wearing a mask No cervical or supraclavicular adenopathy Lungs no rales or rhonchi Heart regular rate and rhythm Abd soft, nontender, positive bowel sounds MSK no focal spinal tenderness, no upper extremity lymphedema Neuro: nonfocal, well oriented, appropriate affect Breasts: The right breast is status post lumpectomy and radiation.  There is the expected coarsening of the skin but otherwise the cosmetic result is good and there is no evidence of disease recurrence.  Left breast is benign.  Both axillae are benign.   LAB RESULTS:  CMP     Component Value Date/Time   NA 140 07/22/2020 1008   K 4.4 07/22/2020 1008   CL 105 07/22/2020 1008   CO2 25 07/22/2020 1008   GLUCOSE 113 (H) 07/22/2020 1008   BUN 16 07/22/2020 1008   CREATININE 0.63 07/22/2020 1008   CALCIUM 9.5 07/22/2020 1008   PROT 6.5 07/22/2020 1008   ALBUMIN 4.2 03/03/2020 1053   AST 35 07/22/2020 1008   AST 21 05/22/2019 1213   ALT 38 (H) 07/22/2020 1008   ALT 23 05/22/2019 1213   ALKPHOS 48 03/03/2020 1053   BILITOT 0.5 07/22/2020 1008   BILITOT 0.6 05/22/2019 1213   GFRNONAA >60 09/18/2019 1340   GFRNONAA >60 05/22/2019 1213   GFRAA >60  09/18/2019 1340   GFRAA >60 05/22/2019 1213    Lab Results  Component  Value Date   WBC 6.4 09/18/2020   NEUTROABS 3.9 09/18/2020   HGB 13.0 09/18/2020   HCT 39.4 09/18/2020   MCV 96.6 09/18/2020   PLT 168 09/18/2020   No results found for: LABCA2  No components found for: UTMLYY503  No results for input(s): INR in the last 168 hours.  No results found for: LABCA2  No results found for: TWS568  No results found for: LEX517  No results found for: GYF749  No results found for: CA2729  No components found for: HGQUANT  No results found for: CEA1 / No results found for: CEA1   No results found for: AFPTUMOR  No results found for: CHROMOGRNA  No results found for: TOTALPROTELP, ALBUMINELP, A1GS, A2GS, BETS, BETA2SER, GAMS, MSPIKE, SPEI (this displays SPEP labs)  No results found for: KPAFRELGTCHN, LAMBDASER, KAPLAMBRATIO (kappa/lambda light chains)  No results found for: HGBA, HGBA2QUANT, HGBFQUANT, HGBSQUAN (Hemoglobinopathy evaluation)   No results found for: LDH  No results found for: IRON, TIBC, IRONPCTSAT (Iron and TIBC)  No results found for: FERRITIN  Urinalysis No results found for: COLORURINE, APPEARANCEUR, LABSPEC, PHURINE, GLUCOSEU, HGBUR, BILIRUBINUR, KETONESUR, PROTEINUR, UROBILINOGEN, NITRITE, LEUKOCYTESUR   STUDIES:  No results found.    ELIGIBLE FOR AVAILABLE RESEARCH PROTOCOL: no   ASSESSMENT: 74 y.o. [place] woman status post right breast lower inner quadrant biopsy 09/27/2018 for a clinical T1c N0 invasive ductal carcinoma, grade 3, estrogen and progesterone receptor positive, HER-2 not amplified, with an MIB-1 of 15%  (1) Genetic testing performed through [facility] Common Hereditary Cancers panel + Melanoma panel on 10/11/2018 showed no pathogenic mutations in APC, ATM, AXIN2, BAP1, BARD1, BMPR1A, BRCA1, BRCA2, BRIP1, BUB1B, CDH1, CDK4, CDKN2A, CHEK2, CTNNA1, DICER1, ENG, EPCAM, GALNT12, GREM1, HOXB13, KIT, MEN1, MITF,  MLH1, MLH3, MSH2, MSH3, MSH6, MUTYH, NBN, NF1, NTHL1, PALB2, PDGFRA, PMS2, POLD1, POLE, POT1 PTEN, RAD50, RAD51C, RAD51D, RB1, RNF43, RPS20, SDHA, SDHB, SDHC, SDHD, SMAD4, SMARCA4, STK11, TP53, TSC1, TSC2, VHL.  (2) status post right lumpectomy and sentinel lymph node sampling 11/07/2018 for a pT2 pN0, stage IIA, grade 3 with a positive anterior margin  (a) a total of 7 lymph nodes were removed  (3) The Oncotype DX score was 10 predicting a risk of outside the breast recurrence over the next 9 years of 3% if the patient's only systemic therapy is tamoxifen for 5 years. It also predicts <1% benefit from chemotherapy.  (4) adjuvant radiation 12/14/2018 - 01/11/2019  (a) Right breast / 42.56 Gy in 16 fractions  (b) Seroma boost / 8 Gy in 4 fractions  (5) started tamoxifen 02/07/2019  (a) DEXA scan at [facility] 07/25/2018 showed a T score of -2.0  (b) repeat DEXA scan at [facility] 09/15/2020 shows a T score of -1.8   PLAN: Tammy Grimes is now just about 2 years out from definitive surgery for her breast cancer with no evidence of disease recurrence.  This is very favorable.  She is tolerating anastrozole well and the plan will be to continue that a total of 5 years.  She has moderate osteopenia.  We talked about her very good walking program.  She is also fixed her vitamin D level which is now over 40.  She will see me again in 1 year.  She knows to call for any other issue that may develop before then  Total encounter time 25 minutes.*  Homer Pfeifer, Virgie Dad, MD  09/18/20 1:10 PM Medical Oncology and Hematology [facility] [place], [place] [address] Tel.  [phone]    Fax. [phone]    I, Wilburn Mylar, am acting as scribe for Dr. Virgie Dad. Bertel Venard.  I, Lurline Del MD, have reviewed the above documentation for accuracy and completeness, and I agree with the above.   *Total Encounter Time as defined by the Centers for Medicare and Medicaid Services includes,  in addition to the face-to-face time of a patient visit (documented in the note above) non-face-to-face time: obtaining and reviewing outside history, ordering and reviewing medications, tests or procedures, care coordination (communications with other health care professionals or caregivers) and documentation in the medical record.

## 2020-09-23 ENCOUNTER — Telehealth: Payer: Self-pay | Admitting: Oncology

## 2020-09-23 NOTE — Telephone Encounter (Signed)
Scheduled appts per 11/11 los. Pt confirmed appt date and time.

## 2020-10-18 ENCOUNTER — Other Ambulatory Visit: Payer: Self-pay | Admitting: Family Medicine

## 2020-11-27 ENCOUNTER — Other Ambulatory Visit: Payer: Self-pay | Admitting: Family Medicine

## 2020-12-23 DIAGNOSIS — L570 Actinic keratosis: Secondary | ICD-10-CM | POA: Diagnosis not present

## 2020-12-23 DIAGNOSIS — L720 Epidermal cyst: Secondary | ICD-10-CM | POA: Diagnosis not present

## 2020-12-23 DIAGNOSIS — D1801 Hemangioma of skin and subcutaneous tissue: Secondary | ICD-10-CM | POA: Diagnosis not present

## 2020-12-23 DIAGNOSIS — L821 Other seborrheic keratosis: Secondary | ICD-10-CM | POA: Diagnosis not present

## 2020-12-23 DIAGNOSIS — Z85820 Personal history of malignant melanoma of skin: Secondary | ICD-10-CM | POA: Diagnosis not present

## 2020-12-23 DIAGNOSIS — Z85828 Personal history of other malignant neoplasm of skin: Secondary | ICD-10-CM | POA: Diagnosis not present

## 2020-12-23 DIAGNOSIS — D225 Melanocytic nevi of trunk: Secondary | ICD-10-CM | POA: Diagnosis not present

## 2021-01-22 ENCOUNTER — Encounter: Payer: Self-pay | Admitting: Family Medicine

## 2021-01-22 ENCOUNTER — Ambulatory Visit (INDEPENDENT_AMBULATORY_CARE_PROVIDER_SITE_OTHER): Payer: Medicare Other | Admitting: Family Medicine

## 2021-01-22 ENCOUNTER — Other Ambulatory Visit: Payer: Self-pay

## 2021-01-22 VITALS — BP 132/86 | HR 88 | Temp 98.6°F | Resp 16 | Ht 61.0 in | Wt 129.0 lb

## 2021-01-22 DIAGNOSIS — Z0000 Encounter for general adult medical examination without abnormal findings: Secondary | ICD-10-CM | POA: Diagnosis not present

## 2021-01-22 DIAGNOSIS — M25552 Pain in left hip: Secondary | ICD-10-CM | POA: Diagnosis not present

## 2021-01-22 DIAGNOSIS — M25562 Pain in left knee: Secondary | ICD-10-CM

## 2021-01-22 DIAGNOSIS — M79605 Pain in left leg: Secondary | ICD-10-CM | POA: Diagnosis not present

## 2021-01-22 DIAGNOSIS — R002 Palpitations: Secondary | ICD-10-CM | POA: Diagnosis not present

## 2021-01-22 DIAGNOSIS — E559 Vitamin D deficiency, unspecified: Secondary | ICD-10-CM

## 2021-01-22 DIAGNOSIS — I10 Essential (primary) hypertension: Secondary | ICD-10-CM

## 2021-01-22 DIAGNOSIS — R739 Hyperglycemia, unspecified: Secondary | ICD-10-CM

## 2021-01-22 DIAGNOSIS — M545 Low back pain, unspecified: Secondary | ICD-10-CM | POA: Diagnosis not present

## 2021-01-22 DIAGNOSIS — E782 Mixed hyperlipidemia: Secondary | ICD-10-CM | POA: Diagnosis not present

## 2021-01-22 DIAGNOSIS — M25561 Pain in right knee: Secondary | ICD-10-CM

## 2021-01-22 LAB — CBC
HCT: 37.4 % (ref 36.0–46.0)
Hemoglobin: 12.5 g/dL (ref 12.0–15.0)
MCHC: 33.4 g/dL (ref 30.0–36.0)
MCV: 95 fl (ref 78.0–100.0)
Platelets: 166 10*3/uL (ref 150.0–400.0)
RBC: 3.94 Mil/uL (ref 3.87–5.11)
RDW: 12.7 % (ref 11.5–15.5)
WBC: 5.3 10*3/uL (ref 4.0–10.5)

## 2021-01-22 LAB — COMPREHENSIVE METABOLIC PANEL
ALT: 45 U/L — ABNORMAL HIGH (ref 0–35)
AST: 35 U/L (ref 0–37)
Albumin: 4.5 g/dL (ref 3.5–5.2)
Alkaline Phosphatase: 49 U/L (ref 39–117)
BUN: 16 mg/dL (ref 6–23)
CO2: 26 mEq/L (ref 19–32)
Calcium: 10 mg/dL (ref 8.4–10.5)
Chloride: 106 mEq/L (ref 96–112)
Creatinine, Ser: 0.61 mg/dL (ref 0.40–1.20)
GFR: 88.85 mL/min (ref >60.00)
Glucose, Bld: 106 mg/dL — ABNORMAL HIGH (ref 70–99)
Potassium: 4.3 mEq/L (ref 3.5–5.1)
Sodium: 143 mEq/L (ref 135–145)
Total Bilirubin: 0.5 mg/dL (ref 0.2–1.2)
Total Protein: 6.6 g/dL (ref 6.0–8.3)

## 2021-01-22 LAB — LIPID PANEL
Cholesterol: 126 mg/dL (ref 0–200)
HDL: 46.1 mg/dL (ref >39.00)
LDL Cholesterol: 55 mg/dL (ref 0–99)
NonHDL: 79.78
Total CHOL/HDL Ratio: 3
Triglycerides: 126 mg/dL (ref 0.0–149.0)
VLDL: 25.2 mg/dL (ref 0.0–40.0)

## 2021-01-22 LAB — HEMOGLOBIN A1C: Hgb A1c MFr Bld: 5.8 % (ref 4.6–6.5)

## 2021-01-22 LAB — VITAMIN D 25 HYDROXY (VIT D DEFICIENCY, FRACTURES): VITD: 92.74 ng/mL (ref 30.00–100.00)

## 2021-01-22 LAB — TSH: TSH: 6.88 u[IU]/mL — ABNORMAL HIGH (ref 0.35–4.50)

## 2021-01-22 NOTE — Progress Notes (Signed)
Patient ID: Tammy Grimes, female    DOB: [birth year]  Age: 75 y.o. MRN: [id number]    Subjective:  Subjective  HPI Tammy Grimes presents for comprehensive physical exam today. She reports having eye drainage  and nasal congestions x 2 months. She states that the constant drainage is causing her to having eye dryness and burning sensation. She reports that being on her phone makes the symptoms worse but denies it triggering her symptoms. She has not taking anything to treat the symptoms. She relates it to seasonal allergies. She denies any chest pain, SOB, fever, abdominal pain, cough, chills, sore throat, dysuria, urinary incontinence, back pain, HA, or N/VD at this time.   She has a PMHx of right breast cancer and after receiving radiation she has develop taste bud dysfunction. She explains that her food taste weird now.   She endorses having constant lower back pain and knee pain that has returned. She describes that the pain radiates from her lower back pain down to her left hip and knee. She reports that walking and standing exacerbates the pain. She has to sit or lie down to relief the pain. She has used a back and knee brace to treat, but only temporary improvement. She has even gone to a Chiropractor and Ortho in the past. She is requesting for a referral back to Ortho to have this checked out. She was recently dx with folliculitis on her right forehead.    Another maternal cousin was dx with breast cancer. She states that 5/7 cousins having been dx. She is taking Vit D and Calcium daily.    Review of Systems  Constitutional: Negative for chills, fatigue and fever.  HENT: Positive for congestion. Negative for rhinorrhea, sinus pressure, sinus pain and sore throat.        (+)abnormal taste buds  Eyes: Negative for pain.       (+)bilateral eye dryness (+)bilateral eye burning  Respiratory: Negative for shortness of breath.   Cardiovascular: Negative for chest pain, palpitations and  leg swelling.  Gastrointestinal: Negative for abdominal pain, blood in stool, diarrhea, nausea and vomiting.  Genitourinary: Negative for flank pain, vaginal bleeding, vaginal discharge and vaginal pain.  Musculoskeletal: Positive for back pain (lower). Negative for myalgias.       (+)left hip and knee pain  Skin: Negative for rash.  Neurological: Negative for dizziness and headaches.    History Past Medical History:  Diagnosis Date  . Allergy   . Anemia    prior to hysterectomy  . BCC (basal cell carcinoma) 08/01/2015  . Breast cancer ([place]) 09/25/2018   right  . Cancer ([place]) 2007   calf of right leg- melanoma  . Cataract   . Cervical cancer screening 06/06/2012  . DDD (degenerative disc disease), cervical 08/01/2015  . Family history of bladder cancer   . Family history of breast cancer   . Family history of colon cancer   . Family history of ovarian cancer   . Family history of prostate cancer   . GERD (gastroesophageal reflux disease)   . Hyperglycemia 03/28/2017  . Hyperlipidemia   . Hypertension 65  . Hypothyroidism   . Knee pain 03/30/2012   L>R  . Low back pain 03/23/2018  . Melanoma of skin ([place]) 08/19/2016  . PONV (postoperative nausea and vomiting)   . Thyroid disease 64  . Vitamin D deficiency 06/06/2012    She has a past surgical history that includes Breast surgery (early 34's); Abdominal hysterectomy (1990's);  Cataract extraction (Bilateral, 12/09/12); Eye surgery (Bilateral, 2015); Melanoma excision (2007); Excision basal cell carcinoma; Colonoscopy; and Breast lumpectomy with radioactive seed and sentinel lymph node biopsy (Right, 11/07/2018).   Her family history includes Alzheimer's disease in her maternal grandmother and mother; Aortic aneurysm in her father; Breast cancer (age of onset: 25) in her cousin and cousin; Breast cancer (age of onset: 82) in her sister; COPD in her father; Cancer in her maternal aunt; Colon cancer in her cousin; Colon cancer (age of  onset: 3) in her paternal uncle; Dementia in her mother; Diabetes (age of onset: 56) in her son; Heart disease in her father; Hypertension in her father and mother; Ovarian cancer (age of onset: 14) in her maternal aunt; Prostate cancer (age of onset: 66) in her maternal grandfather; Stroke in her paternal grandfather.She reports that she has never smoked. She has never used smokeless tobacco. She reports that she does not drink alcohol and does not use drugs.  Current Outpatient Medications on File Prior to Visit  Medication Sig Dispense Refill  . amLODipine (NORVASC) 5 MG tablet TAKE 1 TABLET BY MOUTH AT  BEDTIME 90 tablet 3  . aspirin 81 MG tablet Take 81 mg by mouth daily.    . calcium-vitamin D 250-100 MG-UNIT per tablet Take 1 tablet by mouth daily.    Marland Kitchen levothyroxine (SYNTHROID) 75 MCG tablet TAKE 1 TABLET BY MOUTH  DAILY 90 tablet 3  . metoprolol tartrate (LOPRESSOR) 25 MG tablet TAKE ONE-HALF TABLET BY  MOUTH TWICE DAILY 90 tablet 3  . Multiple Vitamins-Minerals (CENTRUM SILVER 50+WOMEN) TABS Use as directed    . Omega-3 Fatty Acids (OMEGA 3 500) 500 MG CAPS Take 1 capsule by mouth daily.    Marland Kitchen omeprazole (PRILOSEC) 20 MG capsule TAKE 1 CAPSULE BY MOUTH  DAILY 90 capsule 3  . rosuvastatin (CRESTOR) 5 MG tablet TAKE 1 TABLET BY MOUTH  DAILY 90 tablet 3  . tamoxifen (NOLVADEX) 20 MG tablet TAKE 1 TABLET BY MOUTH  DAILY 90 tablet 3  . TURMERIC CURCUMIN PO Take 1,650 mg by mouth daily.     No current facility-administered medications on file prior to visit.     Objective:  Objective  Physical Exam Nursing note reviewed.  Constitutional:      General: She is not in acute distress.    Appearance: Normal appearance. She is well-developed. She is not ill-appearing.  HENT:     Head: Normocephalic and atraumatic.     Right Ear: External ear normal.     Left Ear: External ear normal.     Nose: Nose normal.  Eyes:     Extraocular Movements: Extraocular movements intact.     Pupils:  Pupils are equal, round, and reactive to light.  Neck:     Thyroid: No thyromegaly.     Vascular: No JVD.  Cardiovascular:     Rate and Rhythm: Normal rate and regular rhythm.     Pulses: Normal pulses.     Heart sounds: Normal heart sounds. No murmur heard.   Pulmonary:     Effort: Pulmonary effort is normal. No respiratory distress.     Breath sounds: Normal breath sounds. No wheezing, rhonchi or rales.  Abdominal:     General: Bowel sounds are normal.     Palpations: Abdomen is soft. There is no mass.     Tenderness: There is no abdominal tenderness.  Genitourinary:    Vagina: Normal.  Musculoskeletal:        General: Normal  range of motion.     Cervical back: Normal range of motion and neck supple.     Comments: 5/5 strength in UE and LE  Skin:    General: Skin is warm and dry.     Findings: No rash.  Neurological:     Mental Status: She is alert and oriented to person, place, and time.     Sensory: Sensation is intact. No sensory deficit.     Motor: Motor function is intact.     Deep Tendon Reflexes: Reflexes are normal and symmetric.  Psychiatric:        Behavior: Behavior normal.        Judgment: Judgment normal.    BP 132/86 (BP Location: Left Arm, Patient Position: Sitting, Cuff Size: Small)   Pulse 88   Temp 98.6 F (37 C) (Oral)   Resp 16   Ht 5\' 1"  (1.549 m)   Wt 129 lb (58.5 kg)   SpO2 97%   BMI 24.37 kg/m  Wt Readings from Last 3 Encounters:  01/22/21 129 lb (58.5 kg)  09/18/20 127 lb 12.8 oz (58 kg)  07/22/20 130 lb (59 kg)     Lab Results  Component Value Date   WBC 5.3 01/22/2021   HGB 12.5 01/22/2021   HCT 37.4 01/22/2021   PLT 166.0 01/22/2021   GLUCOSE 106 (H) 01/22/2021   CHOL 126 01/22/2021   TRIG 126.0 01/22/2021   HDL 46.10 01/22/2021   LDLDIRECT 152.0 08/01/2015   LDLCALC 55 01/22/2021   ALT 45 (H) 01/22/2021   AST 35 01/22/2021   NA 143 01/22/2021   K 4.3 01/22/2021   CL 106 01/22/2021   CREATININE 0.61 01/22/2021   BUN  16 01/22/2021   CO2 26 01/22/2021   TSH 6.88 (H) 01/22/2021   HGBA1C 5.8 01/22/2021    MR CARDIAC MORPHOLOGY W WO CONTRAST  Result Date: 04/17/2020 CLINICAL DATA:  Possible left atrial mass on echo EXAM: CARDIAC MRI TECHNIQUE: The patient was scanned on a 1.5 Tesla Siemens magnet. A dedicated cardiac coil was used. Functional imaging was done using Fiesta sequences. 2,3, and 4 chamber views were done to assess for RWMA's. Modified Simpson's rule using a short axis stack was used to calculate an ejection fraction on a dedicated work Conservation officer, nature. The patient received 7cc of Gadavist. After 10 minutes inversion recovery sequences were used to assess for infiltration and scar tissue. CONTRAST:  7 cc  of Gadavist FINDINGS: Left ventricle: -Normal wall thickness -Normal size -Normal systolic function -Normal ECV (25%) -No LGE LV EF:  68% (Normal 56-78%) Absolute volumes: LV EDV: 42mL (Normal 52-141 mL) LV ESV: 72mL (Normal 13-51 mL) LV SV: 76mL (Normal 33-97 mL) CO: 3.3L/min (Normal 2.7-6.0 L/min) Indexed volumes: LV EDV: 47mL/sq-m (Normal 41-81 mL/sq-m) LV ESV: 70mL/sq-m (Normal 12-21 mL/sq-m) LV SV: 20mL/sq-m (Normal 26-56 mL/sq-m) CI: 2.1L/min/sq-m (Normal 1.8-3.8 L/min/sq-m) Right ventricle: Normal size and systolic function RV EF: 92% (Normal 47-80%) Absolute volumes: RV EDV: 196mL (Normal 58-154 mL) RV ESV: 58mL (Normal 12-68 mL) RV SV: 39mL (Normal 35-98 mL) CO: 3.7L/min (Normal 2.7-6 L/min) Indexed volumes: RV EDV: 71mL/sq-m (Normal 48-87 mL/sq-m) RV ESV: 44mL/sq-m (Normal 11-28 mL/sq-m) RV SV: 73mL/sq-m (Normal 27-57 mL/sq-m) CI: 2.3L/min/sq-m (Normal 1.8-3.8 L/min/sq-m) Left atrium: No left atrial mass seen.  Normal size Right atrium: Normal size Mitral valve: No regurgitation Aortic valve: Trileaflet.   No regurgitation Tricuspid valve: No regurgitation Aorta: Normal proximal ascending aorta Pericardium: Small effusion IMPRESSION: 1.  No left atrial  mass seen 2.  Normal LV size, wall  thickness, and systolic function (EF 77%) 3.  Normal RV size and systolic function (EF 93%) 4.  No late gadolinium enhancement to suggest myocardial scar 5.  Small pericardial effusion Electronically Signed   By: Oswaldo Milian MD   On: 04/17/2020 21:23     Assessment & Plan:  Plan    No orders of the defined types were placed in this encounter.   Problem List Items Addressed This Visit    Hyperlipidemia    Encouraged heart healthy diet, increase exercise, avoid trans fats, consider a krill oil cap daily      Relevant Orders   Lipid panel (Completed)   Ambulatory referral to Cardiology   Preventative health care - Primary    Patient encouraged to maintain heart healthy diet, regular exercise, adequate sleep. Consider daily probiotics. Take medications as prescribed. Labs ordered and reviewed      Relevant Orders   CBC (Completed)   Comprehensive metabolic panel (Completed)   TSH (Completed)   Knee pain   Relevant Orders   Ambulatory referral to Orthopedic Surgery   Vitamin D deficiency    Supplement and monitor      Relevant Orders   VITAMIN D 25 Hydroxy (Vit-D Deficiency, Fractures) (Completed)   Hyperglycemia    hgba1c acceptable, minimize simple carbs. Increase exercise as tolerated.       Relevant Orders   Hemoglobin A1c (Completed)   Ambulatory referral to Cardiology   Palpitations    Is doing well but is going to stay with cardiology as a consultation. Would like like to stay with [facility] will refer to Dr Harriet Masson      Relevant Orders   Ambulatory referral to Cardiology   Low back pain radiating to left leg    Encouraged moist heat and gentle stretching as tolerated. May try NSAIDs and prescription meds as directed and report if symptoms worsen or seek immediate care. Feels better sitting or lying down but it is getting worse and affecting her ability to do her ADLs. Walking makes it worse, a back brace and knee brace helps some. Wears a brace on her right knee  for stability at times. Has a h/o DDD. Hip bothers her as well. Has seen ortho, [facility] in past, she has seen chiroprator and PT in the past. Will refer back to [facility]. Dr Maureen Ralphs      Relevant Orders   Ambulatory referral to Orthopedic Surgery    Other Visit Diagnoses    Left hip pain       Relevant Orders   Ambulatory referral to Orthopedic Surgery     Mammo- Last completed at 09/15/2020, Results were normal, repeat in 1 year  Dexa-Last completed in 09/24/2020, osteopenia noted  Colonoscopy-Last completed on 06/07/2017, results normal, no repeat due to age    Follow-up: No follow-ups on file.   I,Alexis Bryant,acting as a Education administrator for Penni Homans, MD.,have documented all relevant documentation on the behalf of Penni Homans, MD,as directed by  Penni Homans, MD while in the presence of Penni Homans, MD.  Medical screening examination/treatment was performed by qualified clinical staff member and as supervising physician I was immediately available for consultation/collaboration. I have reviewed documentation and agree with assessment and plan.  Penni Homans, MD

## 2021-01-22 NOTE — Assessment & Plan Note (Signed)
Encouraged moist heat and gentle stretching as tolerated. May try NSAIDs and prescription meds as directed and report if symptoms worsen or seek immediate care. Feels better sitting or lying down but it is getting worse and affecting her ability to do her ADLs. Walking makes it worse, a back brace and knee brace helps some. Wears a brace on her right knee for stability at times. Has a h/o DDD. Hip bothers her as well. Has seen ortho, [facility] in past, she has seen chiroprator and PT in the past. Will refer back to [facility]. Dr Maureen Ralphs

## 2021-01-22 NOTE — Assessment & Plan Note (Signed)
Encouraged heart healthy diet, increase exercise, avoid trans fats, consider a krill oil cap daily 

## 2021-01-22 NOTE — Patient Instructions (Addendum)
Zyrtec/Cetirizine 10 mg daily for allergies Flonase can be added or used separately  Bone density shows osteopenia, which is thinner than normal but not as bad as osteoporosis. Recommend calcium intake of 1200 to 1500 mg daily, divided into roughly 3 doses. Best source is the diet and a single dairy serving is about 500 mg, a supplement of calcium citrate once or twice daily to balance diet is fine if not getting enough in diet. Also need Vitamin D 2000 IU caps, 1 cap daily if not already taking vitamin D. Also recommend weight baring exercise on hips and upper body to keep bones [id number] Preventive Care 65 Years and Older, Female Preventive care refers to lifestyle choices and visits with your health care provider that can promote health and wellness. This includes:  A yearly physical exam. This is also called an annual wellness visit.  Regular dental and eye exams.  Immunizations.  Screening for certain conditions.  Healthy lifestyle choices, such as: ? Eating a healthy diet. ? Getting regular exercise. ? Not using drugs or products that contain nicotine and tobacco. ? Limiting alcohol use. What can I expect for my preventive care visit? Physical exam Your health care provider will check your:  Height and weight. These may be used to calculate your BMI (body mass index). BMI is a measurement that tells if you are at a healthy weight.  Heart rate and blood pressure.  Body temperature.  Skin for abnormal spots. Counseling Your health care provider may ask you questions about your:  Past medical problems.  Family's medical history.  Alcohol, tobacco, and drug use.  Emotional well-being.  Home life and relationship well-being.  Sexual activity.  Diet, exercise, and sleep habits.  History of falls.  Memory and ability to understand (cognition).  Work and work Statistician.  Pregnancy and menstrual history.  Access to firearms. What immunizations do I  need? Vaccines are usually given at various ages, according to a schedule. Your health care provider will recommend vaccines for you based on your age, medical history, and lifestyle or other factors, such as travel or where you work.   What tests do I need? Blood tests  Lipid and cholesterol levels. These may be checked every 5 years, or more often depending on your overall health.  Hepatitis C test.  Hepatitis B test. Screening  Lung cancer screening. You may have this screening every year starting at age 61 if you have a 30-pack-year history of smoking and currently smoke or have quit within the past 15 years.  Colorectal cancer screening. ? All adults should have this screening starting at age 41 and continuing until age 40. ? Your health care provider may recommend screening at age 68 if you are at increased risk. ? You will have tests every 1-10 years, depending on your results and the type of screening test.  Diabetes screening. ? This is done by checking your blood sugar (glucose) after you have not eaten for a while (fasting). ? You may have this done every 1-3 years.  Mammogram. ? This may be done every 1-2 years. ? Talk with your health care provider about how often you should have regular mammograms.  Abdominal aortic aneurysm (AAA) screening. You may need this if you are a current or former smoker.  BRCA-related cancer screening. This may be done if you have a family history of breast, ovarian, tubal, or peritoneal cancers. Other tests  STD (sexually transmitted disease) testing, if you are at risk.  Bone  density scan. This is done to screen for osteoporosis. You may have this done starting at age 67. Talk with your health care provider about your test results, treatment options, and if necessary, the need for more tests. Follow these instructions at home: Eating and drinking  Eat a diet that includes fresh fruits and vegetables, whole grains, lean protein, and  low-fat dairy products. Limit your intake of foods with high amounts of sugar, saturated fats, and salt.  Take vitamin and mineral supplements as recommended by your health care provider.  Do not drink alcohol if your health care provider tells you not to drink.  If you drink alcohol: ? Limit how much you have to 0-1 drink a day. ? Be aware of how much alcohol is in your drink. In the [place] one drink equals one 12 oz bottle of beer (355 mL), one 5 oz glass of wine (148 mL), or one 1 oz glass of hard liquor (44 mL).   Lifestyle  Take daily care of your teeth and gums. Brush your teeth every morning and night with fluoride toothpaste. Floss one time each day.  Stay active. Exercise for at least 30 minutes 5 or more days each week.  Do not use any products that contain nicotine or tobacco, such as cigarettes, e-cigarettes, and chewing tobacco. If you need help quitting, ask your health care provider.  Do not use drugs.  If you are sexually active, practice safe sex. Use a condom or other form of protection in order to prevent STIs (sexually transmitted infections).  Talk with your health care provider about taking a low-dose aspirin or statin.  Find healthy ways to cope with stress, such as: ? Meditation, yoga, or listening to music. ? Journaling. ? Talking to a trusted person. ? Spending time with friends and family. Safety  Always wear your seat belt while driving or riding in a vehicle.  Do not drive: ? If you have been drinking alcohol. Do not ride with someone who has been drinking. ? When you are tired or distracted. ? While texting.  Wear a helmet and other protective equipment during sports activities.  If you have firearms in your house, make sure you follow all gun safety procedures. What's next?  Visit your health care provider once a year for an annual wellness visit.  Ask your health care provider how often you should have your eyes and teeth checked.  Stay up  to date on all vaccines. This information is not intended to replace advice given to you by your health care provider. Make sure you discuss any questions you have with your health care provider. Document Revised: 10/15/2020 Document Reviewed: 10/19/2018 Elsevier Patient Education  2021 [employer].

## 2021-01-22 NOTE — Assessment & Plan Note (Signed)
hgba1c acceptable, minimize simple carbs. Increase exercise as tolerated.  

## 2021-01-22 NOTE — Assessment & Plan Note (Signed)
Patient encouraged to maintain heart healthy diet, regular exercise, adequate sleep. Consider daily probiotics. Take medications as prescribed. Labs ordered and reviewed 

## 2021-01-22 NOTE — Assessment & Plan Note (Signed)
Is doing well but is going to stay with cardiology as a consultation. Would like like to stay with [facility] will refer to Dr Harriet Masson

## 2021-01-25 NOTE — Assessment & Plan Note (Signed)
Supplement and monitor 

## 2021-01-26 ENCOUNTER — Other Ambulatory Visit: Payer: Self-pay

## 2021-01-26 DIAGNOSIS — E079 Disorder of thyroid, unspecified: Secondary | ICD-10-CM

## 2021-01-26 NOTE — Addendum Note (Signed)
Addended by: Randolm Idol A on: 01/26/2021 03:28 PM   Modules accepted: Orders

## 2021-01-27 DIAGNOSIS — R112 Nausea with vomiting, unspecified: Secondary | ICD-10-CM | POA: Insufficient documentation

## 2021-01-27 DIAGNOSIS — H269 Unspecified cataract: Secondary | ICD-10-CM | POA: Insufficient documentation

## 2021-01-27 DIAGNOSIS — Z9889 Other specified postprocedural states: Secondary | ICD-10-CM | POA: Insufficient documentation

## 2021-01-27 DIAGNOSIS — E039 Hypothyroidism, unspecified: Secondary | ICD-10-CM | POA: Insufficient documentation

## 2021-01-29 ENCOUNTER — Other Ambulatory Visit: Payer: Self-pay

## 2021-01-29 ENCOUNTER — Ambulatory Visit: Payer: Medicare Other | Admitting: Cardiology

## 2021-01-29 ENCOUNTER — Encounter: Payer: Self-pay | Admitting: Cardiology

## 2021-01-29 VITALS — BP 134/86 | HR 68 | Ht 61.0 in | Wt 131.0 lb

## 2021-01-29 DIAGNOSIS — E785 Hyperlipidemia, unspecified: Secondary | ICD-10-CM

## 2021-01-29 DIAGNOSIS — I10 Essential (primary) hypertension: Secondary | ICD-10-CM

## 2021-01-29 NOTE — Patient Instructions (Signed)

## 2021-01-29 NOTE — Progress Notes (Signed)
Cardiology Office Note:    Date:  01/29/2021   ID:  Tammy Grimes, DOB [birth year], MRN [id number]  PCP:  [name], MD  Cardiologist:  No primary care provider on file.  Electrophysiologist:  None   Referring MD: [name], MD     History of Present Illness:    Tammy Grimes is a 75 y.o. female with a history of hypertension, hyperlipidemia, hypothyroidism, melanoma, breast cancer diagnosed in 09/2018 s/p radiation, and GERD who was previously followed by Dr. Saunders Revel.  The patient was last seen in our practice on August 20, 2019 at that time she has some atypical chest pain.  Since that time she has been doing well she tells me.  In the interim she has had a syncope episode where she had an echocardiogram and a cardiac MRI done.  Her cardiac monitor shows small pericardial fusion.  Today she denies any chest pain, any shortness of breath any lightheadedness or dizziness.  Past Medical History:  Diagnosis Date  . Allergy   . Anemia    prior to hysterectomy  . BCC (basal cell carcinoma) 08/01/2015  . Breast cancer ([place]) 09/25/2018   right  . Cancer ([place]) 2007   calf of right leg- melanoma  . Cataract   . Cervical cancer screening 06/06/2012  . DDD (degenerative disc disease), cervical 08/01/2015  . Family history of bladder cancer   . Family history of breast cancer   . Family history of colon cancer   . Family history of ovarian cancer   . Family history of prostate cancer   . GERD (gastroesophageal reflux disease)   . Hyperglycemia 03/28/2017  . Hyperlipidemia   . Hypertension 65  . Hypothyroidism   . Knee pain 03/30/2012   L>R  . Low back pain 03/23/2018  . Melanoma of skin ([place]) 08/19/2016  . PONV (postoperative nausea and vomiting)   . Thyroid disease 64  . Vitamin D deficiency 06/06/2012    Past Surgical History:  Procedure Laterality Date  . ABDOMINAL HYSTERECTOMY  1990's   total, for heavy bleeding and fibroids  . BASAL CELL CARCINOMA EXCISION     . BREAST LUMPECTOMY WITH RADIOACTIVE SEED AND SENTINEL LYMPH NODE BIOPSY Right 11/07/2018   Procedure: RIGHT BREAST LUMPECTOMY WITH RADIOACTIVE SEED AND SENTINEL LYMPH NODE BIOPSY;  Surgeon: [name], MD;  Location: [facility];  Service: General;  Laterality: Right;  . BREAST SURGERY  early 70's   fibroid tumors removed- benign  . CATARACT EXTRACTION Bilateral 12/09/12  . COLONOSCOPY    . EYE SURGERY Bilateral 2015   cataracts  . MELANOMA EXCISION  2007    Current Medications: Current Meds  Medication Sig  . amLODipine (NORVASC) 5 MG tablet TAKE 1 TABLET BY MOUTH AT  BEDTIME  . aspirin 81 MG tablet Take 81 mg by mouth daily.  . calcium-vitamin D 250-100 MG-UNIT per tablet Take 1 tablet by mouth daily.  Marland Kitchen levothyroxine (SYNTHROID) 75 MCG tablet TAKE 1 TABLET BY MOUTH  DAILY  . metoprolol tartrate (LOPRESSOR) 25 MG tablet TAKE ONE-HALF TABLET BY  MOUTH TWICE DAILY  . Multiple Vitamins-Minerals (CENTRUM SILVER 50+WOMEN) TABS Use as directed  . Omega-3 Fatty Acids (OMEGA 3 500) 500 MG CAPS Take 1 capsule by mouth daily.  Marland Kitchen omeprazole (PRILOSEC) 20 MG capsule TAKE 1 CAPSULE BY MOUTH  DAILY  . rosuvastatin (CRESTOR) 5 MG tablet TAKE 1 TABLET BY MOUTH  DAILY  . tamoxifen (NOLVADEX) 20 MG tablet TAKE 1  TABLET BY MOUTH  DAILY  . TURMERIC CURCUMIN PO Take 1,650 mg by mouth daily.     Allergies:   Penicillins   Social History   Socioeconomic History  . Marital status: Married    Spouse name: Not on file  . Number of children: Not on file  . Years of education: Not on file  . Highest education level: Not on file  Occupational History  . Not on file  Tobacco Use  . Smoking status: Never Smoker  . Smokeless tobacco: Never Used  Vaping Use  . Vaping Use: Never used  Substance and Sexual Activity  . Alcohol use: No  . Drug use: No  . Sexual activity: Yes    Comment: lives with husband, retired from [employer] work, no dietary restrictions  Other Topics  Concern  . Not on file  Social History Narrative  . Not on file   Social Determinants of Health   Financial Resource Strain: Not on file  Food Insecurity: Not on file  Transportation Needs: Not on file  Physical Activity: Not on file  Stress: Not on file  Social Connections: Not on file     Family History: The patient's family history includes Alzheimer's disease in her maternal grandmother and mother; Aortic aneurysm in her father; Breast cancer (age of onset: 31) in her cousin and cousin; Breast cancer (age of onset: 12) in her sister; COPD in her father; Cancer in her maternal aunt; Colon cancer in her cousin; Colon cancer (age of onset: 81) in her paternal uncle; Dementia in her mother; Diabetes (age of onset: 77) in her son; Heart disease in her father; Hypertension in her father and mother; Ovarian cancer (age of onset: 24) in her maternal aunt; Prostate cancer (age of onset: 1) in her maternal grandfather; Stroke in her paternal grandfather. There is no history of Esophageal cancer, Pancreatic cancer, Rectal cancer, or Stomach cancer.  ROS:   Review of Systems  Constitution: Negative for decreased appetite, fever and weight gain.  HENT: Negative for congestion, ear discharge, hoarse voice and sore throat.   Eyes: Negative for discharge, redness, vision loss in right eye and visual halos.  Cardiovascular: Negative for chest pain, dyspnea on exertion, leg swelling, orthopnea and palpitations.  Respiratory: Negative for cough, hemoptysis, shortness of breath and snoring.   Endocrine: Negative for heat intolerance and polyphagia.  Hematologic/Lymphatic: Negative for bleeding problem. Does not bruise/bleed easily.  Skin: Negative for flushing, nail changes, rash and suspicious lesions.  Musculoskeletal: Negative for arthritis, joint pain, muscle cramps, myalgias, neck pain and stiffness.  Gastrointestinal: Negative for abdominal pain, bowel incontinence, diarrhea and excessive  appetite.  Genitourinary: Negative for decreased libido, genital sores and incomplete emptying.  Neurological: Negative for brief paralysis, focal weakness, headaches and loss of balance.  Psychiatric/Behavioral: Negative for altered mental status, depression and suicidal ideas.  Allergic/Immunologic: Negative for HIV exposure and persistent infections.    EKGs/Labs/Other Studies Reviewed:    The following studies were reviewed today:   EKG:  The ekg ordered today demonstrates sinus rhythm, heart rate 60 bpm with low voltage in precordial leads.   Exercise Myoview 05/16/2017:  Nuclear stress EF: 74%.  Blood pressure demonstrated a normal response to exercise.  There was no ST segment deviation noted during stress.  Defect 1: There is a small defect of moderate severity present in the apex location.  This is a low risk study.  The left ventricular ejection fraction is hyperdynamic (>65%).  Abnormal, low risk stress  nuclear study with mild apical ischemia vs shifting breast attenuation; EF 74 with normal wall motion.   Cardiac MR 04/16/2020 FINDINGS: Left ventricle:  -Normal wall thickness  -Normal size  -Normal systolic function  -Normal ECV (25%)  -No LGE  LV EF:  68% (Normal 56-78%)  Absolute volumes:  LV EDV: 58mL (Normal 52-141 mL)  LV ESV: 61mL (Normal 13-51 mL)  LV SV: 60mL (Normal 33-97 mL)  CO: 3.3L/min (Normal 2.7-6.0 L/min)  Indexed volumes:  LV EDV: 11mL/sq-m (Normal 41-81 mL/sq-m)  LV ESV: 71mL/sq-m (Normal 12-21 mL/sq-m)  LV SV: 47mL/sq-m (Normal 26-56 mL/sq-m)  CI: 2.1L/min/sq-m (Normal 1.8-3.8 L/min/sq-m)  Right ventricle: Normal size and systolic function  RV EF: 56% (Normal 47-80%)  Absolute volumes:  RV EDV: 151mL (Normal 58-154 mL)  RV ESV: 39mL (Normal 12-68 mL)  RV SV: 55mL (Normal 35-98 mL)  CO: 3.7L/min (Normal 2.7-6 L/min)  Indexed volumes:  RV EDV: 69mL/sq-m (Normal 48-87 mL/sq-m)  RV  ESV: 11mL/sq-m (Normal 11-28 mL/sq-m)  RV SV: 34mL/sq-m (Normal 27-57 mL/sq-m)  CI: 2.3L/min/sq-m (Normal 1.8-3.8 L/min/sq-m)  Left atrium: No left atrial mass seen.  Normal size  Right atrium: Normal size  Mitral valve: No regurgitation  Aortic valve: Trileaflet.   No regurgitation  Tricuspid valve: No regurgitation  Aorta: Normal proximal ascending aorta  Pericardium: Small effusion  IMPRESSION: 1.  No left atrial mass seen  2.  Normal LV size, wall thickness, and systolic function (EF 67%)  3.  Normal RV size and systolic function (EF 20%)  4.  No late gadolinium enhancement to suggest myocardial scar  5.  Small pericardial effusion    Recent Labs: 03/03/2020: Magnesium 1.7 01/22/2021: ALT 45; BUN 16; Creatinine, Ser 0.61; Hemoglobin 12.5; Platelets 166.0; Potassium 4.3; Sodium 143; TSH 6.88  Recent Lipid Panel    Component Value Date/Time   CHOL 126 01/22/2021 0955   CHOL 123 08/22/2017 0814   TRIG 126.0 01/22/2021 0955   HDL 46.10 01/22/2021 0955   HDL 42 08/22/2017 0814   CHOLHDL 3 01/22/2021 0955   VLDL 25.2 01/22/2021 0955   LDLCALC 55 01/22/2021 0955   LDLCALC 57 07/22/2020 1008   LDLDIRECT 152.0 08/01/2015 1013    Physical Exam:    VS:  BP 134/86 (BP Location: Left Arm)   Pulse 68   Ht 5\' 1"  (1.549 m)   Wt 131 lb 0.6 oz (59.4 kg)   SpO2 98%   BMI 24.76 kg/m     Wt Readings from Last 3 Encounters:  01/29/21 131 lb 0.6 oz (59.4 kg)  01/22/21 129 lb (58.5 kg)  09/18/20 127 lb 12.8 oz (58 kg)     GEN: Well nourished, well developed in no acute distress HEENT: Normal NECK: No JVD; No carotid bruits LYMPHATICS: No lymphadenopathy CARDIAC: S1S2 noted,RRR, no murmurs, rubs, gallops RESPIRATORY:  Clear to auscultation without rales, wheezing or rhonchi  ABDOMEN: Soft, non-tender, non-distended, +bowel sounds, no guarding. EXTREMITIES: No edema, No cyanosis, no clubbing MUSCULOSKELETAL:  No deformity  SKIN: Warm and  dry NEUROLOGIC:  Alert and oriented x 3, non-focal PSYCHIATRIC:  Normal affect, good insight  ASSESSMENT:    1. Hypertension, unspecified type   2. Hyperlipidemia, unspecified hyperlipidemia type    PLAN:     EKG did show some low voltage in right precordial leads, she does not have any significant ST segment changes.  She has no report of chest pain or shortness of breath.  She basically is here to reestablish cardiac care.  Her blood pressure  is acceptable, no medication changes will be made at this time.  Hyperlipidemia - continue with current statin medication.  The patient is in agreement with the above plan. The patient left the office in stable condition.  The patient will follow up in   Medication Adjustments/Labs and Tests Ordered: Current medicines are reviewed at length with the patient today.  Concerns regarding medicines are outlined above.  Orders Placed This Encounter  Procedures  . EKG 12-Lead   No orders of the defined types were placed in this encounter.   Patient Instructions  Medication Instructions:  Your physician recommends that you continue on your current medications as directed. Please refer to the Current Medication list given to you today.  *If you need a refill on your cardiac medications before your next appointment, please call your pharmacy*   Lab Work: None If you have labs (blood work) drawn today and your tests are completely normal, you will receive your results only by: Marland Kitchen MyChart Message (if you have MyChart) OR . A paper copy in the mail If you have any lab test that is abnormal or we need to change your treatment, we will call you to review the results.   Testing/Procedures: None  Follow-Up: At [facility], you and your health needs are our priority.  As part of our continuing mission to provide you with exceptional heart care, we have created designated Provider Care Teams.  These Care Teams include your primary Cardiologist  (physician) and Advanced Practice Providers (APPs -  Physician Assistants and Nurse Practitioners) who all work together to provide you with the care you need, when you need it.  We recommend signing up for the patient portal called "MyChart".  Sign up information is provided on this After Visit Summary.  MyChart is used to connect with patients for Virtual Visits (Telemedicine).  Patients are able to view lab/test results, encounter notes, upcoming appointments, etc.  Non-urgent messages can be sent to your provider as well.   To learn more about what you can do with MyChart, go to [email or web address].    Your next appointment:   1 year(s)  The format for your next appointment:   In Person  Provider:   Berniece Salines, DO   Other Instructions      Adopting a Healthy Lifestyle.  Know what a healthy weight is for you (roughly BMI <25) and aim to maintain this   Aim for 7+ servings of fruits and vegetables daily   65-80+ fluid ounces of water or unsweet tea for healthy kidneys   Limit to max 1 drink of alcohol per day; avoid smoking/tobacco   Limit animal fats in diet for cholesterol and heart health - choose grass fed whenever available   Avoid highly processed foods, and foods high in saturated/trans fats   Aim for low stress - take time to unwind and care for your mental health   Aim for 150 min of moderate intensity exercise weekly for heart health, and weights twice weekly for bone health   Aim for 7-9 hours of sleep daily   When it comes to diets, agreement about the perfect plan isnt easy to find, even among the experts. Experts at the [employer] developed an idea known as the Healthy Eating Plate. Just imagine a plate divided into logical, healthy portions.   The emphasis is on diet quality:   Load up on vegetables and fruits - one-half of your plate: Aim for color and variety, and  remember that potatoes dont count.   Go for whole grains -  one-quarter of your plate: Whole wheat, barley, wheat berries, quinoa, oats, brown rice, and foods made with them. If you want pasta, go with whole wheat pasta.   Protein power - one-quarter of your plate: Fish, chicken, beans, and nuts are all healthy, versatile protein sources. Limit red meat.   The diet, however, does go beyond the plate, offering a few other suggestions.   Use healthy plant oils, such as olive, canola, soy, corn, sunflower and peanut. Check the labels, and avoid partially hydrogenated oil, which have unhealthy trans fats.   If youre thirsty, drink water. Coffee and tea are good in moderation, but skip sugary drinks and limit milk and dairy products to one or two daily servings.   The type of carbohydrate in the diet is more important than the amount. Some sources of carbohydrates, such as vegetables, fruits, whole grains, and beans-are healthier than others.   Finally, stay active  Signed, Berniece Salines, DO  01/29/2021 12:03 PM    [facility]

## 2021-02-21 ENCOUNTER — Other Ambulatory Visit: Payer: Self-pay | Admitting: Family Medicine

## 2021-02-24 DIAGNOSIS — M170 Bilateral primary osteoarthritis of knee: Secondary | ICD-10-CM | POA: Diagnosis not present

## 2021-02-24 DIAGNOSIS — M5459 Other low back pain: Secondary | ICD-10-CM | POA: Diagnosis not present

## 2021-02-24 DIAGNOSIS — M1712 Unilateral primary osteoarthritis, left knee: Secondary | ICD-10-CM | POA: Diagnosis not present

## 2021-02-24 DIAGNOSIS — M25552 Pain in left hip: Secondary | ICD-10-CM | POA: Diagnosis not present

## 2021-02-24 DIAGNOSIS — M1711 Unilateral primary osteoarthritis, right knee: Secondary | ICD-10-CM | POA: Diagnosis not present

## 2021-03-04 DIAGNOSIS — M25562 Pain in left knee: Secondary | ICD-10-CM | POA: Diagnosis not present

## 2021-03-04 DIAGNOSIS — M25561 Pain in right knee: Secondary | ICD-10-CM | POA: Diagnosis not present

## 2021-03-06 DIAGNOSIS — M25562 Pain in left knee: Secondary | ICD-10-CM | POA: Diagnosis not present

## 2021-03-06 DIAGNOSIS — M25561 Pain in right knee: Secondary | ICD-10-CM | POA: Diagnosis not present

## 2021-03-09 DIAGNOSIS — M25562 Pain in left knee: Secondary | ICD-10-CM | POA: Diagnosis not present

## 2021-03-09 DIAGNOSIS — M25561 Pain in right knee: Secondary | ICD-10-CM | POA: Diagnosis not present

## 2021-03-10 ENCOUNTER — Other Ambulatory Visit: Payer: Self-pay | Admitting: Family Medicine

## 2021-03-11 DIAGNOSIS — M25561 Pain in right knee: Secondary | ICD-10-CM | POA: Diagnosis not present

## 2021-03-11 DIAGNOSIS — M25562 Pain in left knee: Secondary | ICD-10-CM | POA: Diagnosis not present

## 2021-03-24 ENCOUNTER — Other Ambulatory Visit: Payer: Self-pay | Admitting: Oncology

## 2021-03-27 DIAGNOSIS — M545 Low back pain, unspecified: Secondary | ICD-10-CM | POA: Diagnosis not present

## 2021-03-30 DIAGNOSIS — M25562 Pain in left knee: Secondary | ICD-10-CM | POA: Diagnosis not present

## 2021-03-30 DIAGNOSIS — M25561 Pain in right knee: Secondary | ICD-10-CM | POA: Diagnosis not present

## 2021-04-01 DIAGNOSIS — M25562 Pain in left knee: Secondary | ICD-10-CM | POA: Diagnosis not present

## 2021-04-01 DIAGNOSIS — M25561 Pain in right knee: Secondary | ICD-10-CM | POA: Diagnosis not present

## 2021-04-07 ENCOUNTER — Other Ambulatory Visit: Payer: Self-pay | Admitting: *Deleted

## 2021-04-07 ENCOUNTER — Other Ambulatory Visit (INDEPENDENT_AMBULATORY_CARE_PROVIDER_SITE_OTHER): Payer: Medicare Other

## 2021-04-07 ENCOUNTER — Other Ambulatory Visit: Payer: Self-pay

## 2021-04-07 DIAGNOSIS — E079 Disorder of thyroid, unspecified: Secondary | ICD-10-CM | POA: Diagnosis not present

## 2021-04-07 LAB — TSH: TSH: 6.78 u[IU]/mL — ABNORMAL HIGH (ref 0.35–4.50)

## 2021-04-07 MED ORDER — LEVOTHYROXINE SODIUM 88 MCG PO TABS
88.0000 ug | ORAL_TABLET | Freq: Every day | ORAL | 1 refills | Status: DC
Start: 2021-04-07 — End: 2021-08-26

## 2021-04-08 DIAGNOSIS — M25561 Pain in right knee: Secondary | ICD-10-CM | POA: Diagnosis not present

## 2021-04-08 DIAGNOSIS — M25562 Pain in left knee: Secondary | ICD-10-CM | POA: Diagnosis not present

## 2021-04-09 DIAGNOSIS — M545 Low back pain, unspecified: Secondary | ICD-10-CM | POA: Diagnosis not present

## 2021-04-09 DIAGNOSIS — M2241 Chondromalacia patellae, right knee: Secondary | ICD-10-CM | POA: Diagnosis not present

## 2021-04-09 DIAGNOSIS — M94262 Chondromalacia, left knee: Secondary | ICD-10-CM | POA: Diagnosis not present

## 2021-04-13 DIAGNOSIS — M25562 Pain in left knee: Secondary | ICD-10-CM | POA: Diagnosis not present

## 2021-04-13 DIAGNOSIS — M25561 Pain in right knee: Secondary | ICD-10-CM | POA: Diagnosis not present

## 2021-04-16 DIAGNOSIS — M25561 Pain in right knee: Secondary | ICD-10-CM | POA: Diagnosis not present

## 2021-04-16 DIAGNOSIS — M25562 Pain in left knee: Secondary | ICD-10-CM | POA: Diagnosis not present

## 2021-04-20 DIAGNOSIS — M25562 Pain in left knee: Secondary | ICD-10-CM | POA: Diagnosis not present

## 2021-04-20 DIAGNOSIS — M25561 Pain in right knee: Secondary | ICD-10-CM | POA: Diagnosis not present

## 2021-04-22 DIAGNOSIS — M25562 Pain in left knee: Secondary | ICD-10-CM | POA: Diagnosis not present

## 2021-04-22 DIAGNOSIS — M25561 Pain in right knee: Secondary | ICD-10-CM | POA: Diagnosis not present

## 2021-04-23 DIAGNOSIS — M5416 Radiculopathy, lumbar region: Secondary | ICD-10-CM | POA: Diagnosis not present

## 2021-04-27 DIAGNOSIS — M25561 Pain in right knee: Secondary | ICD-10-CM | POA: Diagnosis not present

## 2021-04-27 DIAGNOSIS — M25562 Pain in left knee: Secondary | ICD-10-CM | POA: Diagnosis not present

## 2021-05-13 DIAGNOSIS — M25561 Pain in right knee: Secondary | ICD-10-CM | POA: Diagnosis not present

## 2021-05-13 DIAGNOSIS — M25562 Pain in left knee: Secondary | ICD-10-CM | POA: Diagnosis not present

## 2021-05-18 DIAGNOSIS — M25562 Pain in left knee: Secondary | ICD-10-CM | POA: Diagnosis not present

## 2021-05-18 DIAGNOSIS — M25561 Pain in right knee: Secondary | ICD-10-CM | POA: Diagnosis not present

## 2021-05-20 DIAGNOSIS — M545 Low back pain, unspecified: Secondary | ICD-10-CM | POA: Diagnosis not present

## 2021-05-20 DIAGNOSIS — M47816 Spondylosis without myelopathy or radiculopathy, lumbar region: Secondary | ICD-10-CM | POA: Diagnosis not present

## 2021-05-22 DIAGNOSIS — M25561 Pain in right knee: Secondary | ICD-10-CM | POA: Diagnosis not present

## 2021-05-22 DIAGNOSIS — M25562 Pain in left knee: Secondary | ICD-10-CM | POA: Diagnosis not present

## 2021-05-26 DIAGNOSIS — M25561 Pain in right knee: Secondary | ICD-10-CM | POA: Diagnosis not present

## 2021-05-26 DIAGNOSIS — M25562 Pain in left knee: Secondary | ICD-10-CM | POA: Diagnosis not present

## 2021-05-28 DIAGNOSIS — M25562 Pain in left knee: Secondary | ICD-10-CM | POA: Diagnosis not present

## 2021-05-28 DIAGNOSIS — M25561 Pain in right knee: Secondary | ICD-10-CM | POA: Diagnosis not present

## 2021-06-01 DIAGNOSIS — M25561 Pain in right knee: Secondary | ICD-10-CM | POA: Diagnosis not present

## 2021-06-01 DIAGNOSIS — M25562 Pain in left knee: Secondary | ICD-10-CM | POA: Diagnosis not present

## 2021-06-08 DIAGNOSIS — M25562 Pain in left knee: Secondary | ICD-10-CM | POA: Diagnosis not present

## 2021-06-08 DIAGNOSIS — M25561 Pain in right knee: Secondary | ICD-10-CM | POA: Diagnosis not present

## 2021-06-11 DIAGNOSIS — M47816 Spondylosis without myelopathy or radiculopathy, lumbar region: Secondary | ICD-10-CM | POA: Diagnosis not present

## 2021-06-16 DIAGNOSIS — M25561 Pain in right knee: Secondary | ICD-10-CM | POA: Diagnosis not present

## 2021-06-16 DIAGNOSIS — M25562 Pain in left knee: Secondary | ICD-10-CM | POA: Diagnosis not present

## 2021-06-24 DIAGNOSIS — M25562 Pain in left knee: Secondary | ICD-10-CM | POA: Diagnosis not present

## 2021-06-24 DIAGNOSIS — M25561 Pain in right knee: Secondary | ICD-10-CM | POA: Diagnosis not present

## 2021-06-29 DIAGNOSIS — M25562 Pain in left knee: Secondary | ICD-10-CM | POA: Diagnosis not present

## 2021-06-29 DIAGNOSIS — M25561 Pain in right knee: Secondary | ICD-10-CM | POA: Diagnosis not present

## 2021-07-03 DIAGNOSIS — Z170 Estrogen receptor positive status [ER+]: Secondary | ICD-10-CM | POA: Diagnosis not present

## 2021-07-03 DIAGNOSIS — C50511 Malignant neoplasm of lower-outer quadrant of right female breast: Secondary | ICD-10-CM | POA: Diagnosis not present

## 2021-07-03 DIAGNOSIS — I890 Lymphedema, not elsewhere classified: Secondary | ICD-10-CM | POA: Diagnosis not present

## 2021-07-06 DIAGNOSIS — M25562 Pain in left knee: Secondary | ICD-10-CM | POA: Diagnosis not present

## 2021-07-06 DIAGNOSIS — M25561 Pain in right knee: Secondary | ICD-10-CM | POA: Diagnosis not present

## 2021-07-08 DIAGNOSIS — M47816 Spondylosis without myelopathy or radiculopathy, lumbar region: Secondary | ICD-10-CM | POA: Diagnosis not present

## 2021-07-09 DIAGNOSIS — M25562 Pain in left knee: Secondary | ICD-10-CM | POA: Diagnosis not present

## 2021-07-09 DIAGNOSIS — M25561 Pain in right knee: Secondary | ICD-10-CM | POA: Diagnosis not present

## 2021-07-21 DIAGNOSIS — M25562 Pain in left knee: Secondary | ICD-10-CM | POA: Diagnosis not present

## 2021-07-21 DIAGNOSIS — M25561 Pain in right knee: Secondary | ICD-10-CM | POA: Diagnosis not present

## 2021-07-27 ENCOUNTER — Ambulatory Visit (INDEPENDENT_AMBULATORY_CARE_PROVIDER_SITE_OTHER): Payer: Medicare Other | Admitting: Family Medicine

## 2021-07-27 ENCOUNTER — Encounter: Payer: Self-pay | Admitting: Family Medicine

## 2021-07-27 ENCOUNTER — Other Ambulatory Visit: Payer: Self-pay

## 2021-07-27 VITALS — BP 122/84 | HR 76 | Temp 97.9°F | Resp 16 | Wt 130.8 lb

## 2021-07-27 DIAGNOSIS — M8580 Other specified disorders of bone density and structure, unspecified site: Secondary | ICD-10-CM

## 2021-07-27 DIAGNOSIS — E559 Vitamin D deficiency, unspecified: Secondary | ICD-10-CM | POA: Diagnosis not present

## 2021-07-27 DIAGNOSIS — M79605 Pain in left leg: Secondary | ICD-10-CM

## 2021-07-27 DIAGNOSIS — D649 Anemia, unspecified: Secondary | ICD-10-CM

## 2021-07-27 DIAGNOSIS — E782 Mixed hyperlipidemia: Secondary | ICD-10-CM

## 2021-07-27 DIAGNOSIS — I10 Essential (primary) hypertension: Secondary | ICD-10-CM

## 2021-07-27 DIAGNOSIS — R739 Hyperglycemia, unspecified: Secondary | ICD-10-CM

## 2021-07-27 DIAGNOSIS — L989 Disorder of the skin and subcutaneous tissue, unspecified: Secondary | ICD-10-CM | POA: Insufficient documentation

## 2021-07-27 DIAGNOSIS — E039 Hypothyroidism, unspecified: Secondary | ICD-10-CM | POA: Diagnosis not present

## 2021-07-27 DIAGNOSIS — E079 Disorder of thyroid, unspecified: Secondary | ICD-10-CM

## 2021-07-27 DIAGNOSIS — C50911 Malignant neoplasm of unspecified site of right female breast: Secondary | ICD-10-CM | POA: Diagnosis not present

## 2021-07-27 DIAGNOSIS — M545 Low back pain, unspecified: Secondary | ICD-10-CM

## 2021-07-27 LAB — COMPREHENSIVE METABOLIC PANEL
ALT: 95 U/L — ABNORMAL HIGH (ref 0–35)
AST: 79 U/L — ABNORMAL HIGH (ref 0–37)
Albumin: 4.5 g/dL (ref 3.5–5.2)
Alkaline Phosphatase: 46 U/L (ref 39–117)
BUN: 19 mg/dL (ref 6–23)
CO2: 25 mEq/L (ref 19–32)
Calcium: 9.4 mg/dL (ref 8.4–10.5)
Chloride: 106 mEq/L (ref 96–112)
Creatinine, Ser: 0.7 mg/dL (ref 0.40–1.20)
GFR: 85.64 mL/min (ref >60.00)
Glucose, Bld: 102 mg/dL — ABNORMAL HIGH (ref 70–99)
Potassium: 4.2 mEq/L (ref 3.5–5.1)
Sodium: 142 mEq/L (ref 135–145)
Total Bilirubin: 0.6 mg/dL (ref 0.2–1.2)
Total Protein: 6.8 g/dL (ref 6.0–8.3)

## 2021-07-27 LAB — CBC WITH DIFFERENTIAL/PLATELET
Basophils Absolute: 0 10*3/uL (ref 0.0–0.1)
Basophils Relative: 0.7 % (ref 0.0–3.0)
Eosinophils Absolute: 0.2 10*3/uL (ref 0.0–0.7)
Eosinophils Relative: 2.7 % (ref 0.0–5.0)
HCT: 38.7 % (ref 36.0–46.0)
Hemoglobin: 12.6 g/dL (ref 12.0–15.0)
Lymphocytes Relative: 26.1 % (ref 12.0–46.0)
Lymphs Abs: 1.5 10*3/uL (ref 0.7–4.0)
MCHC: 32.5 g/dL (ref 30.0–36.0)
MCV: 96.2 fl (ref 78.0–100.0)
Monocytes Absolute: 0.4 10*3/uL (ref 0.1–1.0)
Monocytes Relative: 7.5 % (ref 3.0–12.0)
Neutro Abs: 3.6 10*3/uL (ref 1.4–7.7)
Neutrophils Relative %: 63 % (ref 43.0–77.0)
Platelets: 150 10*3/uL (ref 150.0–400.0)
RBC: 4.02 Mil/uL (ref 3.87–5.11)
RDW: 13 % (ref 11.5–15.5)
WBC: 5.7 10*3/uL (ref 4.0–10.5)

## 2021-07-27 LAB — LIPID PANEL
Cholesterol: 129 mg/dL (ref 0–200)
HDL: 45.3 mg/dL (ref >39.00)
LDL Cholesterol: 55 mg/dL (ref 0–99)
NonHDL: 83.36
Total CHOL/HDL Ratio: 3
Triglycerides: 144 mg/dL (ref 0.0–149.0)
VLDL: 28.8 mg/dL (ref 0.0–40.0)

## 2021-07-27 LAB — HEMOGLOBIN A1C: Hgb A1c MFr Bld: 5.9 % (ref 4.6–6.5)

## 2021-07-27 LAB — TSH: TSH: 3.92 u[IU]/mL (ref 0.35–5.50)

## 2021-07-27 NOTE — Assessment & Plan Note (Signed)
hgba1c acceptable, minimize simple carbs. Increase exercise as tolerated.  

## 2021-07-27 NOTE — Assessment & Plan Note (Signed)
Maintained on Tamoxifen and following with oncology

## 2021-07-27 NOTE — Assessment & Plan Note (Signed)
Encouraged to get adequate exercise, calcium and vitamin d intake 

## 2021-07-27 NOTE — Assessment & Plan Note (Signed)
Labs reveal deficiency. Start on Vitamin D 50000 IU caps, 1 cap po weekly x 12 weeks. Disp #4 with 4 rf. Also take daily Vitamin D over the counter. If already taking a daily supplement increase by 1000 IU daily and if not start Vitamin D 2000 IU daily.  

## 2021-07-27 NOTE — Assessment & Plan Note (Signed)
Left forehead, follows with Dermatology, Dr Amy Martinique, has been present for years and stable, not itching but sometimes slightly tender to palpation. Biopsy confirms chronic folliculitis

## 2021-07-27 NOTE — Assessment & Plan Note (Signed)
Well controlled, no changes to meds. Encouraged heart healthy diet such as the DASH diet and exercise as tolerated.  °

## 2021-07-27 NOTE — Assessment & Plan Note (Signed)
Resolved on last check 

## 2021-07-27 NOTE — Assessment & Plan Note (Signed)
Encourage heart healthy diet such as MIND or DASH diet, increase exercise, avoid trans fats, simple carbohydrates and processed foods, consider a krill or fish or flaxseed oil cap daily.  Tolerating Rosuvastatin 

## 2021-07-27 NOTE — Assessment & Plan Note (Signed)
On Levothyroxine, continue to monitor 

## 2021-07-27 NOTE — Patient Instructions (Signed)
Paxlovid is the new COVID medication we can give you if you get COVID so make sure you test if you have symptoms because we have to treat by day 5 of symptoms for it to be effective. If you are positive let us know so we can treat. If a home test is negative and your symptoms are persistent get a PCR test. Can check testing locations at [email or web address] If you are positive we will make an appointment with [place] and we will send in Paxlovid if you would like it. Check with your pharmacy before we meet to confirm they have it in stock, if they do not then we can get the prescription at the [facility]    Hypertension, Adult High blood pressure (hypertension) is when the force of blood pumping through the arteries is too strong. The arteries are the blood vessels that carry blood from the heart throughout the body. Hypertension forces the heart to work harder to pump blood and may cause arteries to become narrow or stiff. Untreated or uncontrolled hypertension can cause a heart attack, heart failure, a stroke, kidney disease, and other problems. A blood pressure reading consists of a higher number over a lower number. Ideally, your blood pressure should be below 120/80. The first ("top") number is called the systolic pressure. It is a measure of the pressure in your arteries as your heart beats. The second ("bottom") number is called the diastolic pressure. It is a measure of the pressure in your arteries as the heart relaxes. What are the causes? The exact cause of this condition is not known. There are some conditions that result in or are related to high blood pressure. What increases the risk? Some risk factors for high blood pressure are under your control. The following factors may make you more likely to develop this condition: Smoking. Having type 2 diabetes mellitus, high cholesterol, or both. Not getting enough exercise or physical activity. Being overweight. Having too much fat,  sugar, calories, or salt (sodium) in your diet. Drinking too much alcohol. Some risk factors for high blood pressure may be difficult or impossible to change. Some of these factors include: Having chronic kidney disease. Having a family history of high blood pressure. Age. Risk increases with age. Race. You may be at higher risk if you are African American. Gender. Men are at higher risk than women before age 74. After age 69, women are at higher risk than men. Having obstructive sleep apnea. Stress. What are the signs or symptoms? High blood pressure may not cause symptoms. Very high blood pressure (hypertensive crisis) may cause: Headache. Anxiety. Shortness of breath. Nosebleed. Nausea and vomiting. Vision changes. Severe chest pain. Seizures. How is this diagnosed? This condition is diagnosed by measuring your blood pressure while you are seated, with your arm resting on a flat surface, your legs uncrossed, and your feet flat on the floor. The cuff of the blood pressure monitor will be placed directly against the skin of your upper arm at the level of your heart. It should be measured at least twice using the same arm. Certain conditions can cause a difference in blood pressure between your right and left arms. Certain factors can cause blood pressure readings to be lower or higher than normal for a short period of time: When your blood pressure is higher when you are in a health care provider's office than when you are at home, this is called white coat hypertension. Most people with this condition  do not need medicines. When your blood pressure is higher at home than when you are in a health care provider's office, this is called masked hypertension. Most people with this condition may need medicines to control blood pressure. If you have a high blood pressure reading during one visit or you have normal blood pressure with other risk factors, you may be asked to: Return on a different  day to have your blood pressure checked again. Monitor your blood pressure at home for 1 week or longer. If you are diagnosed with hypertension, you may have other blood or imaging tests to help your health care provider understand your overall risk for other conditions. How is this treated? This condition is treated by making healthy lifestyle changes, such as eating healthy foods, exercising more, and reducing your alcohol intake. Your health care provider may prescribe medicine if lifestyle changes are not enough to get your blood pressure under control, and if: Your systolic blood pressure is above 130. Your diastolic blood pressure is above 80. Your personal target blood pressure may vary depending on your medical conditions, your age, and other factors. Follow these instructions at home: Eating and drinking  Eat a diet that is high in fiber and potassium, and low in sodium, added sugar, and fat. An example eating plan is called the DASH (Dietary Approaches to Stop Hypertension) diet. To eat this way: Eat plenty of fresh fruits and vegetables. Try to fill one half of your plate at each meal with fruits and vegetables. Eat whole grains, such as whole-wheat pasta, brown rice, or whole-grain bread. Fill about one fourth of your plate with whole grains. Eat or drink low-fat dairy products, such as skim milk or low-fat yogurt. Avoid fatty cuts of meat, processed or cured meats, and poultry with skin. Fill about one fourth of your plate with lean proteins, such as fish, chicken without skin, beans, eggs, or tofu. Avoid pre-made and processed foods. These tend to be higher in sodium, added sugar, and fat. Reduce your daily sodium intake. Most people with hypertension should eat less than 1,500 mg of sodium a day. Do not drink alcohol if: Your health care provider tells you not to drink. You are pregnant, may be pregnant, or are planning to become pregnant. If you drink alcohol: Limit how much you  use to: 0-1 drink a day for women. 0-2 drinks a day for men. Be aware of how much alcohol is in your drink. In the [place] one drink equals one 12 oz bottle of beer (355 mL), one 5 oz glass of wine (148 mL), or one 1 oz glass of hard liquor (44 mL). Lifestyle  Work with your health care provider to maintain a healthy body weight or to lose weight. Ask what an ideal weight is for you. Get at least 30 minutes of exercise most days of the week. Activities may include walking, swimming, or biking. Include exercise to strengthen your muscles (resistance exercise), such as Pilates or lifting weights, as part of your weekly exercise routine. Try to do these types of exercises for 30 minutes at least 3 days a week. Do not use any products that contain nicotine or tobacco, such as cigarettes, e-cigarettes, and chewing tobacco. If you need help quitting, ask your health care provider. Monitor your blood pressure at home as told by your health care provider. Keep all follow-up visits as told by your health care provider. This is important. Medicines Take over-the-counter and prescription medicines only as told  by your health care provider. Follow directions carefully. Blood pressure medicines must be taken as prescribed. Do not skip doses of blood pressure medicine. Doing this puts you at risk for problems and can make the medicine less effective. Ask your health care provider about side effects or reactions to medicines that you should watch for. Contact a health care provider if you: Think you are having a reaction to a medicine you are taking. Have headaches that keep coming back (recurring). Feel dizzy. Have swelling in your ankles. Have trouble with your vision. Get help right away if you: Develop a severe headache or confusion. Have unusual weakness or numbness. Feel faint. Have severe pain in your chest or abdomen. Vomit repeatedly. Have trouble breathing. Summary Hypertension is when the  force of blood pumping through your arteries is too strong. If this condition is not controlled, it may put you at risk for serious complications. Your personal target blood pressure may vary depending on your medical conditions, your age, and other factors. For most people, a normal blood pressure is less than 120/80. Hypertension is treated with lifestyle changes, medicines, or a combination of both. Lifestyle changes include losing weight, eating a healthy, low-sodium diet, exercising more, and limiting alcohol. This information is not intended to replace advice given to you by your health care provider. Make sure you discuss any questions you have with your health care provider. Document Revised: 07/05/2018 Document Reviewed: 07/05/2018 Elsevier Patient Education  [place].

## 2021-07-27 NOTE — Assessment & Plan Note (Addendum)
Left leg has been evaluated by ortho, had an epidural and 2 cortisone injections in low back, second helped for about two weeks so now planning an ablation once insurance

## 2021-07-27 NOTE — Progress Notes (Signed)
Subjective:   By signing my name below, I, Tammy Grimes, attest that this documentation has been prepared under the direction and in the presence of Tammy Lukes, MD. 07/27/2021   Patient ID: Tammy Grimes, female    DOB: 01-30-1946, 75 y.o.   MRN: [id number]  No chief complaint on file.   HPI Patient is in today for an office visit and 6 month f/u  She has a case of persistent, recurring folliculitis on the top right side of her forehead. She reports it as tender and not itchy. She has seen a dermatologist for it and tried several treatment but the spot keeps coming back.  She mentions she has gotten cortisone injections for the pain that radiates from her hip down to her knees. She reports the pain has reduced in frequency but is severe when she gets them. She has been going to physical therapy and mentions it has brought great relief. She has also been following up with her orthopedic doctor.  The dosage of synthroid was recently increased from 75 mcg to 88 mcg PO daily.  She takes calcium pills and 1000 mg Vitamin C daily.  She is willing to get the flu vaccine and Covid-19 booster in October.  She will be moving to [place] soon and will need paperwork.  Past Medical History:  Diagnosis Date   Allergy    Anemia    prior to hysterectomy   BCC (basal cell carcinoma) 08/01/2015   Breast cancer ([facility]) 09/25/2018   right   Cancer ([facility]) 2007   calf of right leg- melanoma   Cataract    Cervical cancer screening 06/06/2012   DDD (degenerative disc disease), cervical 08/01/2015   Family history of bladder cancer    Family history of breast cancer    Family history of colon cancer    Family history of ovarian cancer    Family history of prostate cancer    GERD (gastroesophageal reflux disease)    Hyperglycemia 03/28/2017   Hyperlipidemia    Hypertension 65   Hypothyroidism    Knee pain 03/30/2012   L>R   Low back pain 03/23/2018   Melanoma of skin ([facility]) 08/19/2016    PONV (postoperative nausea and vomiting)    Thyroid disease 64   Vitamin D deficiency 06/06/2012    Past Surgical History:  Procedure Laterality Date   ABDOMINAL HYSTERECTOMY  1990's   total, for heavy bleeding and fibroids   BASAL CELL CARCINOMA EXCISION     BREAST LUMPECTOMY WITH RADIOACTIVE SEED AND SENTINEL LYMPH NODE BIOPSY Right 11/07/2018   Procedure: RIGHT BREAST LUMPECTOMY WITH RADIOACTIVE SEED AND SENTINEL LYMPH NODE BIOPSY;  Surgeon: [name], MD;  Location: [facility];  Service: General;  Laterality: Right;   BREAST SURGERY  early 70's   fibroid tumors removed- benign   CATARACT EXTRACTION Bilateral 12/09/12   COLONOSCOPY     EYE SURGERY Bilateral 2015   cataracts   MELANOMA EXCISION  2007    Family History  Problem Relation Age of Onset   Hypertension Mother    Alzheimer's disease Mother    Dementia Mother        alzheimer's   Aortic aneurysm Father    Hypertension Father    COPD Father        smoker   Heart disease Father        s/p bypass, aortic aneurysm, carotid artery disease   Breast cancer Sister 45   Diabetes Son 49  type 1   Alzheimer's disease Maternal Grandmother    Prostate cancer Maternal Grandfather 60       metastatic   Stroke Paternal Grandfather    Ovarian cancer Maternal Aunt 75   Cancer Maternal Aunt        cervical or uterine   Colon cancer Paternal Uncle 70   Breast cancer Cousin 69   Breast cancer Cousin 44   Colon cancer Cousin        dx >50   Esophageal cancer Neg Hx    Pancreatic cancer Neg Hx    Rectal cancer Neg Hx    Stomach cancer Neg Hx     Social History   Socioeconomic History   Marital status: Married    Spouse name: Not on file   Number of children: Not on file   Years of education: Not on file   Highest education level: Not on file  Occupational History   Not on file  Tobacco Use   Smoking status: Never   Smokeless tobacco: Never  Vaping Use   Vaping Use: Never used  Substance  and Sexual Activity   Alcohol use: No   Drug use: No   Sexual activity: Yes    Comment: lives with husband, retired from [employer] work, no dietary restrictions  Other Topics Concern   Not on file  Social History Narrative   Not on file   Social Determinants of Health   Financial Resource Strain: Not on file  Food Insecurity: Not on file  Transportation Needs: Not on file  Physical Activity: Not on file  Stress: Not on file  Social Connections: Not on file  Intimate Partner Violence: Not on file    Outpatient Medications Prior to Visit  Medication Sig Dispense Refill   amLODipine (NORVASC) 5 MG tablet TAKE 1 TABLET BY MOUTH AT  BEDTIME 90 tablet 1   Ascorbic Acid (VITAMIN C) 1000 MG tablet Take 1,000 mg by mouth daily.     aspirin 81 MG tablet Take 81 mg by mouth daily.     calcium-vitamin D 250-100 MG-UNIT per tablet Take 1 tablet by mouth daily.     levothyroxine (SYNTHROID) 88 MCG tablet Take 1 tablet (88 mcg total) by mouth daily. 90 tablet 1   metoprolol tartrate (LOPRESSOR) 25 MG tablet TAKE ONE-HALF TABLET BY  MOUTH TWICE DAILY 90 tablet 3   Multiple Vitamins-Minerals (CENTRUM SILVER 50+WOMEN) TABS Use as directed     Omega-3 Fatty Acids (OMEGA 3 500) 500 MG CAPS Take 1 capsule by mouth daily.     omeprazole (PRILOSEC) 20 MG capsule TAKE 1 CAPSULE BY MOUTH  DAILY 90 capsule 3   rosuvastatin (CRESTOR) 5 MG tablet TAKE 1 TABLET BY MOUTH  DAILY 90 tablet 3   tamoxifen (NOLVADEX) 20 MG tablet TAKE 1 TABLET BY MOUTH  DAILY 90 tablet 3   TURMERIC CURCUMIN PO Take 1,650 mg by mouth daily.     No facility-administered medications prior to visit.    Allergies  Allergen Reactions   Penicillins Rash    Review of Systems  Constitutional:  Negative for fever and malaise/fatigue.  HENT:  Negative for congestion.   Eyes:  Negative for redness.  Respiratory:  Negative for shortness of breath.   Cardiovascular:  Negative for chest pain, palpitations and leg swelling.   Gastrointestinal:  Negative for abdominal pain, blood in stool and nausea.  Genitourinary:  Negative for dysuria and frequency.  Musculoskeletal:  Positive for back pain and joint  pain (hip, knee). Negative for falls.  Skin:  Negative for rash.  Neurological:  Negative for dizziness, loss of consciousness and headaches.  Endo/Heme/Allergies:  Negative for polydipsia.  Psychiatric/Behavioral:  Negative for depression. The patient is not nervous/anxious.       Objective:    Physical Exam Constitutional:      General: She is not in acute distress.    Appearance: She is well-developed.  HENT:     Head: Normocephalic and atraumatic.     Right Ear: Tympanic membrane, ear canal and external ear normal.     Left Ear: Tympanic membrane, ear canal and external ear normal.  Eyes:     Conjunctiva/sclera: Conjunctivae normal.  Neck:     Thyroid: No thyromegaly.  Cardiovascular:     Rate and Rhythm: Normal rate and regular rhythm.     Heart sounds: Normal heart sounds. No murmur heard. Pulmonary:     Effort: Pulmonary effort is normal. No respiratory distress.     Breath sounds: Normal breath sounds.  Abdominal:     General: Bowel sounds are normal. There is no distension.     Palpations: Abdomen is soft. There is no mass.     Tenderness: There is no abdominal tenderness.  Musculoskeletal:     Cervical back: Neck supple.  Lymphadenopathy:     Cervical: No cervical adenopathy.  Skin:    General: Skin is warm and dry.     Comments: Folliculitis on right side of forehead  Neurological:     Mental Status: She is alert and oriented to person, place, and time.  Psychiatric:        Behavior: Behavior normal.    BP 122/84   Pulse 76   Temp 97.9 F (36.6 C)   Resp 16   Wt 130 lb 12.8 oz (59.3 kg)   SpO2 98%   BMI 24.71 kg/m  Wt Readings from Last 3 Encounters:  07/27/21 130 lb 12.8 oz (59.3 kg)  01/29/21 131 lb 0.6 oz (59.4 kg)  01/22/21 129 lb (58.5 kg)    Diabetic Foot Exam  - Simple   No data filed    Lab Results  Component Value Date   WBC 5.3 01/22/2021   HGB 12.5 01/22/2021   HCT 37.4 01/22/2021   PLT 166.0 01/22/2021   GLUCOSE 106 (H) 01/22/2021   CHOL 126 01/22/2021   TRIG 126.0 01/22/2021   HDL 46.10 01/22/2021   LDLDIRECT 152.0 08/01/2015   LDLCALC 55 01/22/2021   ALT 45 (H) 01/22/2021   AST 35 01/22/2021   NA 143 01/22/2021   K 4.3 01/22/2021   CL 106 01/22/2021   CREATININE 0.61 01/22/2021   BUN 16 01/22/2021   CO2 26 01/22/2021   TSH 6.78 (H) 04/07/2021   HGBA1C 5.8 01/22/2021    Lab Results  Component Value Date   TSH 6.78 (H) 04/07/2021   Lab Results  Component Value Date   WBC 5.3 01/22/2021   HGB 12.5 01/22/2021   HCT 37.4 01/22/2021   MCV 95.0 01/22/2021   PLT 166.0 01/22/2021   Lab Results  Component Value Date   NA 143 01/22/2021   K 4.3 01/22/2021   CO2 26 01/22/2021   GLUCOSE 106 (H) 01/22/2021   BUN 16 01/22/2021   CREATININE 0.61 01/22/2021   BILITOT 0.5 01/22/2021   ALKPHOS 49 01/22/2021   AST 35 01/22/2021   ALT 45 (H) 01/22/2021   PROT 6.6 01/22/2021   ALBUMIN 4.5 01/22/2021   CALCIUM 10.0  01/22/2021   ANIONGAP 9 09/18/2020   GFR 88.85 01/22/2021   Lab Results  Component Value Date   CHOL 126 01/22/2021   Lab Results  Component Value Date   HDL 46.10 01/22/2021   Lab Results  Component Value Date   LDLCALC 55 01/22/2021   Lab Results  Component Value Date   TRIG 126.0 01/22/2021   Lab Results  Component Value Date   CHOLHDL 3 01/22/2021   Lab Results  Component Value Date   HGBA1C 5.8 01/22/2021       Assessment & Plan:   Problem List Items Addressed This Visit     Thyroid disease - Primary   Relevant Orders   TSH   Essential hypertension   Relevant Orders   CBC with Differential/Platelet   Comprehensive metabolic panel   Lipid panel   Anemia    Resolved on last check      Relevant Orders   CBC with Differential/Platelet   Comprehensive metabolic panel   Lipid  panel   Hyperlipidemia    Encourage heart healthy diet such as MIND or DASH diet, increase exercise, avoid trans fats, simple carbohydrates and processed foods, consider a krill or fish or flaxseed oil cap daily. Tolerating Rosuvastatin      Relevant Orders   CBC with Differential/Platelet   Comprehensive metabolic panel   Lipid panel   Osteopenia    Encouraged to get adequate exercise, calcium and vitamin d intake      Vitamin D deficiency    Labs reveal deficiency. Start on Vitamin D 50000 IU caps, 1 cap po weekly x 12 weeks. Disp #4 with 4 rf. Also take daily Vitamin D over the counter. If already taking a daily supplement increase by 1000 IU daily and if not start Vitamin D 2000 IU daily.       Relevant Orders   Vitamin D 1,25 dihydroxy   Hyperglycemia    hgba1c acceptable, minimize simple carbs. Increase exercise as tolerated.      Relevant Orders   Hemoglobin A1c   Low back pain radiating to left leg    Left leg has been evaluated by ortho, had an epidural and 2 cortisone injections in low back, second helped for about two weeks so now planning an ablation once insurance      Hypothyroidism    On Levothyroxine, continue to monitor      Hypertension    Well controlled, no changes to meds. Encouraged heart healthy diet such as the DASH diet and exercise as tolerated.       Breast cancer ([facility])    Maintained on Tamoxifen and following with oncology      Skin lesion of face    Left forehead, follows with Dermatology, Dr Amy Martinique, has been present for years and stable, not itching but sometimes slightly tender to palpation. Biopsy confirms chronic folliculitis       No orders of the defined types were placed in this encounter.   I,Tammy Grimes,acting as a Education administrator for Penni Homans, MD.,have documented all relevant documentation on the behalf of Penni Homans, MD,as directed by  Penni Homans, MD while in the presence of Penni Homans, MD.   I, Tammy Lukes, MD. ,  personally preformed the services described in this documentation.  All medical record entries made by the scribe were at my direction and in my presence.  I have reviewed the chart and discharge instructions (if applicable) and agree that the record reflects my personal performance  and is accurate and complete. 07/27/2021

## 2021-07-29 DIAGNOSIS — M25561 Pain in right knee: Secondary | ICD-10-CM | POA: Diagnosis not present

## 2021-07-29 DIAGNOSIS — M25562 Pain in left knee: Secondary | ICD-10-CM | POA: Diagnosis not present

## 2021-07-31 LAB — VITAMIN D 1,25 DIHYDROXY
Vitamin D 1, 25 (OH)2 Total: 68 pg/mL (ref 18–72)
Vitamin D2 1, 25 (OH)2: <8 pg/mL
Vitamin D3 1, 25 (OH)2: 68 pg/mL

## 2021-08-05 DIAGNOSIS — M25562 Pain in left knee: Secondary | ICD-10-CM | POA: Diagnosis not present

## 2021-08-05 DIAGNOSIS — M25561 Pain in right knee: Secondary | ICD-10-CM | POA: Diagnosis not present

## 2021-08-06 ENCOUNTER — Other Ambulatory Visit: Payer: Self-pay | Admitting: Family Medicine

## 2021-08-10 DIAGNOSIS — M47816 Spondylosis without myelopathy or radiculopathy, lumbar region: Secondary | ICD-10-CM | POA: Diagnosis not present

## 2021-08-13 ENCOUNTER — Other Ambulatory Visit: Payer: Self-pay | Admitting: Family Medicine

## 2021-08-25 ENCOUNTER — Other Ambulatory Visit: Payer: Self-pay | Admitting: Family Medicine

## 2021-09-08 DIAGNOSIS — M47816 Spondylosis without myelopathy or radiculopathy, lumbar region: Secondary | ICD-10-CM | POA: Diagnosis not present

## 2021-09-10 DIAGNOSIS — Z853 Personal history of malignant neoplasm of breast: Secondary | ICD-10-CM | POA: Diagnosis not present

## 2021-09-10 DIAGNOSIS — R922 Inconclusive mammogram: Secondary | ICD-10-CM | POA: Diagnosis not present

## 2021-09-10 LAB — HM MAMMOGRAPHY

## 2021-09-15 ENCOUNTER — Encounter: Payer: Self-pay | Admitting: Family Medicine

## 2021-09-16 NOTE — Telephone Encounter (Signed)
LM requesting call back to schedule VV with [facility] provider.

## 2021-09-16 NOTE — Telephone Encounter (Signed)
Scheduled her 11/11 vv at 11:20 with Percell Miller.

## 2021-09-18 ENCOUNTER — Encounter: Payer: Self-pay | Admitting: Medical

## 2021-09-18 ENCOUNTER — Other Ambulatory Visit: Payer: Self-pay

## 2021-09-18 ENCOUNTER — Telehealth (INDEPENDENT_AMBULATORY_CARE_PROVIDER_SITE_OTHER): Payer: Medicare Other | Admitting: Medical

## 2021-09-18 DIAGNOSIS — U071 COVID-19: Secondary | ICD-10-CM | POA: Diagnosis not present

## 2021-09-18 MED ORDER — MOLNUPIRAVIR EUA 200MG CAPSULE
4.0000 | ORAL_CAPSULE | Freq: Two times a day (BID) | ORAL | 0 refills | Status: AC
Start: 2021-09-18 — End: 2021-09-23

## 2021-09-18 NOTE — Progress Notes (Signed)
Subjective:    Patient ID: Tammy Grimes, female    DOB: Mar 14, 1946, 75 y.o.   MRN: [id number]  HPI Virtual Visit via Video Note  I connected with Tammy Grimes on 09/18/21 at 11:20 AM EST by a video enabled telemedicine application and verified that I am speaking with the correct person using two identifiers.  Location: Patient: home Provider: office   I discussed the limitations of evaluation and management by telemedicine and the availability of in person appointments. The patient expressed understanding and agreed to proceed.  History of Present Illness:  Pt recently diagnosed with covid on Monday. At home test. She started feeling snifflle on Sunday evening and had cough. States just like a cold. No fever no chills or sweats. No bodyaches and no wheezing.   Test used-otc.  History of covid vaccine x 4. Pt last vaccine was June 9th, 2022.   Covid risk score- 4  History of covid infection-no  Current symptoms-see above  Pt 02 sat-98%     Observations/Objective: General-no acute distress, pleasant, oriented. Lungs- on inspection lungs appear unlabored. Neck- no tracheal deviation or jvd on inspection. Neuro- gross motor function appears intact.   Assessment and Plan: Patient Instructions  COVID infection in 75 year old with risk factor score of 4 and 4 prior COVID-vaccine.  Symptoms are very mild and is on day 5 of symptom onset.  Good oxygen saturation and appears clinically stable.   Explained to patient that she is in the upper end of 5-day treatment window timeframe.  We are approaching the weekend and and decided to go ahead and make antiviral molnupiravir available if over the next 10 to 12 hours or signs symptoms worsen or change.  I made her aware of medications emergency use authorization designation.  Patient expresses that and left her symptoms worsen she would rather not take the medication.   Advised patient conservative measures for symptomatic  treatment.  If she has worsening or changing signs symptoms let us know.  Discussed with patient on how and when to and quarantine per CDC guidelines.  Answered patient's questions and discussed when to get a booster vaccine.  Follow-up as regular scheduled with PCP or sooner if needed.   Mackie Pai, PA-C   Follow Up Instructions:    I discussed the assessment and treatment plan with the patient. The patient was provided an opportunity to ask questions and all were answered. The patient agreed with the plan and demonstrated an understanding of the instructions.   The patient was advised to call back or seek an in-person evaluation if the symptoms worsen or if the condition fails to improve as anticipated.  Time spent with patient today was  21 minutes which consisted of chart review, discussing diagnosis, work up treatment and documentation.    Mackie Pai, PA-C    Review of Systems  Constitutional:  Negative for chills, fatigue and fever.  HENT:  Positive for congestion. Negative for mouth sores, nosebleeds, rhinorrhea and sinus pain.        Sniffles.  Respiratory:  Positive for cough. Negative for choking, shortness of breath and wheezing.        Rare cough.  Cardiovascular:  Negative for chest pain and palpitations.  Gastrointestinal:  Negative for abdominal pain.  Genitourinary:  Negative for difficulty urinating, dyspareunia, dysuria, genital sores and hematuria.  Musculoskeletal:  Negative for back pain.  Skin:  Negative for rash.  Neurological:  Negative for dizziness and headaches.  Hematological:  Negative for  adenopathy. Does not bruise/bleed easily.  Psychiatric/Behavioral:  Negative for behavioral problems, confusion and decreased concentration. The patient is not nervous/anxious.     Past Medical History:  Diagnosis Date   Allergy    Anemia    prior to hysterectomy   BCC (basal cell carcinoma) 08/01/2015   Breast cancer ([facility]) 09/25/2018   right   Cancer  ([facility]) 2007   calf of right leg- melanoma   Cataract    Cervical cancer screening 06/06/2012   DDD (degenerative disc disease), cervical 08/01/2015   Family history of bladder cancer    Family history of breast cancer    Family history of colon cancer    Family history of ovarian cancer    Family history of prostate cancer    GERD (gastroesophageal reflux disease)    Hyperglycemia 03/28/2017   Hyperlipidemia    Hypertension 75   Hypothyroidism    Knee pain 03/30/2012   L>R   Low back pain 03/23/2018   Melanoma of skin ([facility]) 08/19/2016   PONV (postoperative nausea and vomiting)    Thyroid disease 64   Vitamin D deficiency 06/06/2012     Social History   Socioeconomic History   Marital status: Married    Spouse name: Not on file   Number of children: Not on file   Years of education: Not on file   Highest education level: Not on file  Occupational History   Not on file  Tobacco Use   Smoking status: Never   Smokeless tobacco: Never  Vaping Use   Vaping Use: Never used  Substance and Sexual Activity   Alcohol use: No   Drug use: No   Sexual activity: Yes    Comment: lives with husband, retired from [employer] work, no dietary restrictions  Other Topics Concern   Not on file  Social History Narrative   Not on file   Social Determinants of Health   Financial Resource Strain: Not on file  Food Insecurity: Not on file  Transportation Needs: Not on file  Physical Activity: Not on file  Stress: Not on file  Social Connections: Not on file  Intimate Partner Violence: Not on file    Past Surgical History:  Procedure Laterality Date   ABDOMINAL HYSTERECTOMY  1990's   total, for heavy bleeding and fibroids   BASAL CELL CARCINOMA EXCISION     BREAST LUMPECTOMY WITH RADIOACTIVE SEED AND SENTINEL LYMPH NODE BIOPSY Right 11/07/2018   Procedure: RIGHT BREAST LUMPECTOMY WITH RADIOACTIVE SEED AND SENTINEL LYMPH NODE BIOPSY;  Surgeon: [name], MD;  Location: [facility];  Service: General;  Laterality: Right;   BREAST SURGERY  early 70's   fibroid tumors removed- benign   CATARACT EXTRACTION Bilateral 12/09/12   COLONOSCOPY     EYE SURGERY Bilateral 2015   cataracts   MELANOMA EXCISION  2007    Family History  Problem Relation Age of Onset   Hypertension Mother    Alzheimer's disease Mother    Dementia Mother        alzheimer's   Aortic aneurysm Father    Hypertension Father    COPD Father        smoker   Heart disease Father        s/p bypass, aortic aneurysm, carotid artery disease   Breast cancer Sister 19   Diabetes Son 22       type 1   Alzheimer's disease Maternal Grandmother    Prostate cancer Maternal Grandfather 60  metastatic   Stroke Paternal Grandfather    Ovarian cancer Maternal Aunt 75   Cancer Maternal Aunt        cervical or uterine   Colon cancer Paternal Uncle 39   Breast cancer Cousin 97   Breast cancer Cousin 55   Colon cancer Cousin        dx >50   Esophageal cancer Neg Hx    Pancreatic cancer Neg Hx    Rectal cancer Neg Hx    Stomach cancer Neg Hx     Allergies  Allergen Reactions   Penicillins Rash    Current Outpatient Medications on File Prior to Visit  Medication Sig Dispense Refill   amLODipine (NORVASC) 5 MG tablet TAKE 1 TABLET BY MOUTH AT  BEDTIME 90 tablet 1   Ascorbic Acid (VITAMIN C) 1000 MG tablet Take 1,000 mg by mouth daily.     aspirin 81 MG tablet Take 81 mg by mouth daily.     calcium-vitamin D 250-100 MG-UNIT per tablet Take 1 tablet by mouth daily.     levothyroxine (SYNTHROID) 88 MCG tablet TAKE 1 TABLET BY MOUTH  DAILY 90 tablet 3   metoprolol tartrate (LOPRESSOR) 25 MG tablet TAKE ONE-HALF TABLET BY  MOUTH TWICE DAILY 90 tablet 3   Multiple Vitamins-Minerals (CENTRUM SILVER 50+WOMEN) TABS Use as directed     Omega-3 Fatty Acids (OMEGA 3 500) 500 MG CAPS Take 1 capsule by mouth daily.     omeprazole (PRILOSEC) 20 MG capsule TAKE 1 CAPSULE BY MOUTH  DAILY 90  capsule 1   rosuvastatin (CRESTOR) 5 MG tablet TAKE 1 TABLET BY MOUTH  DAILY 90 tablet 3   tamoxifen (NOLVADEX) 20 MG tablet TAKE 1 TABLET BY MOUTH  DAILY 90 tablet 3   TURMERIC CURCUMIN PO Take 1,650 mg by mouth daily.     No current facility-administered medications on file prior to visit.    SpO2 98%       Objective:   Physical Exam        Assessment & Plan:

## 2021-09-18 NOTE — Patient Instructions (Addendum)
COVID infection in 75 year old with risk factor score of 4 and 4 prior COVID-vaccine.  Symptoms are very mild and is on day 5 of symptom onset.  Good oxygen saturation and appears clinically stable.   Explained to patient that she is in the upper end of 5-day treatment window timeframe.  We are approaching the weekend and and decided to go ahead and make antiviral molnupiravir available if over the next 10 to 12 hours or signs symptoms worsen or change.  I made her aware of medications emergency use authorization designation.  Patient expresses that and left her symptoms worsen she would rather not take the medication.   Advised patient conservative measures for symptomatic treatment.  If she has worsening or changing signs symptoms let us know.  Discussed with patient on how and when to and quarantine per CDC guidelines.  Answered patient's questions and discussed when to get a booster vaccine.  Follow-up as regular scheduled with PCP or sooner if needed.

## 2021-09-19 ENCOUNTER — Other Ambulatory Visit: Payer: Self-pay | Admitting: Family Medicine

## 2021-09-22 ENCOUNTER — Inpatient Hospital Stay: Payer: Medicare Other

## 2021-09-22 ENCOUNTER — Inpatient Hospital Stay: Payer: Medicare Other | Admitting: Oncology

## 2021-10-07 ENCOUNTER — Inpatient Hospital Stay: Payer: Medicare Other | Attending: Oncology

## 2021-10-07 ENCOUNTER — Other Ambulatory Visit: Payer: Self-pay

## 2021-10-07 ENCOUNTER — Inpatient Hospital Stay: Payer: Medicare Other | Admitting: Oncology

## 2021-10-07 VITALS — BP 129/85 | HR 65 | Temp 97.7°F | Resp 18 | Ht 61.0 in | Wt 126.3 lb

## 2021-10-07 DIAGNOSIS — Z8052 Family history of malignant neoplasm of bladder: Secondary | ICD-10-CM | POA: Insufficient documentation

## 2021-10-07 DIAGNOSIS — Z8042 Family history of malignant neoplasm of prostate: Secondary | ICD-10-CM | POA: Diagnosis not present

## 2021-10-07 DIAGNOSIS — C50311 Malignant neoplasm of lower-inner quadrant of right female breast: Secondary | ICD-10-CM | POA: Insufficient documentation

## 2021-10-07 DIAGNOSIS — C50511 Malignant neoplasm of lower-outer quadrant of right female breast: Secondary | ICD-10-CM | POA: Diagnosis not present

## 2021-10-07 DIAGNOSIS — Z8041 Family history of malignant neoplasm of ovary: Secondary | ICD-10-CM | POA: Diagnosis not present

## 2021-10-07 DIAGNOSIS — Z170 Estrogen receptor positive status [ER+]: Secondary | ICD-10-CM | POA: Insufficient documentation

## 2021-10-07 DIAGNOSIS — Z7982 Long term (current) use of aspirin: Secondary | ICD-10-CM | POA: Diagnosis not present

## 2021-10-07 DIAGNOSIS — Z800 Family history of malignant neoplasm of digestive organs: Secondary | ICD-10-CM | POA: Diagnosis not present

## 2021-10-07 DIAGNOSIS — Z803 Family history of malignant neoplasm of breast: Secondary | ICD-10-CM | POA: Insufficient documentation

## 2021-10-07 DIAGNOSIS — Z79810 Long term (current) use of selective estrogen receptor modulators (SERMs): Secondary | ICD-10-CM | POA: Diagnosis not present

## 2021-10-07 DIAGNOSIS — M8580 Other specified disorders of bone density and structure, unspecified site: Secondary | ICD-10-CM | POA: Insufficient documentation

## 2021-10-07 DIAGNOSIS — Z79899 Other long term (current) drug therapy: Secondary | ICD-10-CM | POA: Diagnosis not present

## 2021-10-07 LAB — CBC WITH DIFFERENTIAL/PLATELET
Abs Immature Granulocytes: 0.02 10*3/uL (ref 0.00–0.07)
Basophils Absolute: 0 10*3/uL (ref 0.0–0.1)
Basophils Relative: 1 %
Eosinophils Absolute: 0.1 10*3/uL (ref 0.0–0.5)
Eosinophils Relative: 2 %
HCT: 37.3 % (ref 36.0–46.0)
Hemoglobin: 12.3 g/dL (ref 12.0–15.0)
Immature Granulocytes: 0 %
Lymphocytes Relative: 26 %
Lymphs Abs: 1.5 10*3/uL (ref 0.7–4.0)
MCH: 31.4 pg (ref 26.0–34.0)
MCHC: 33 g/dL (ref 30.0–36.0)
MCV: 95.2 fL (ref 80.0–100.0)
Monocytes Absolute: 0.6 10*3/uL (ref 0.1–1.0)
Monocytes Relative: 11 %
Neutro Abs: 3.4 10*3/uL (ref 1.7–7.7)
Neutrophils Relative %: 60 %
Platelets: 159 10*3/uL (ref 150–400)
RBC: 3.92 MIL/uL (ref 3.87–5.11)
RDW: 12.4 % (ref 11.5–15.5)
WBC: 5.7 10*3/uL (ref 4.0–10.5)
nRBC: 0 % (ref 0.0–0.2)

## 2021-10-07 NOTE — Progress Notes (Signed)
[place]  [phone] [phone]    ID: Tammy Grimes DOB: 19-Oct-1946  MR#: [id number]  QIW#:[id number]  Patient Care Team: [name], MD as PCP - General (Family Medicine) Martinique, Amy, MD as Consulting Physician (Dermatology) Rutherford Guys, MD as Consulting Physician (Ophthalmology) [name], MD as Consulting Physician (General Surgery) Mikah Rottinghaus, Virgie Dad, MD as Consulting Physician (Oncology) Kyung Rudd, MD as Consulting Physician (Radiation Oncology) Gatha Mayer, MD as Consulting Physician (Gastroenterology) Mauro Kaufmann, RN as Oncology Nurse Navigator Rockwell Germany, RN as Oncology Nurse Navigator OTHER MD: Dr. Purcell Nails, Dr. Sharyon Medicus DDS PA   CHIEF COMPLAINT: Estrogen receptor positive breast cancer  CURRENT TREATMENT: Tamoxifen   INTERVAL HISTORY: Tammy Grimes returns today for follow-up of her estrogen receptor positive breast cancer.   She continues on tamoxifen.  She is tolerating this with no obvious side effects  Her most recent bone density screening on 08/15/2020 showed a T-score of -1.3, which is considered osteopenic prior in the same areas was -1.0)..  She also underwent bilateral diagnostic mammography with tomography at [facility] on 09/10/2021 showing: breast density category C; no evidence of malignancy in either breast.   REVIEW OF SYSTEMS: Tammy Grimes has significant back and knee problems.  She is working with Dr. Nelva Bush in Ortho for this and she is hoping for some improvement.  She tells me she and her husband are waiting for a place at [employer] near [place] and plan to move early next year.  Aside from that a detailed review of systems today was stable   COVID 19 VACCINATION STATUS: Pfizer x4, last 04/2021; infection 09/15/2021    HISTORY OF CURRENT ILLNESS: From the original intake note:  "Tammy Grimes" had routine screening mammography on 09/11/2018 showing a possible abnormality in the right breast. She  underwent right unilateral diagnostic mammography with tomography and right breast ultrasonography at [facility] on 09/21/2018 showing: Breast Density Category C. 1.3 cm irregular focal asymmetry in the right breast lower inner aspect anterior depth 4 cm from the nipple noted on the mammogram, but on the ultrasound this measured at [2.8] cm. The irregular mass is hypoechoic with posterior acoustic shadowing. Color flow imaging demonstrates increased vascularity. Elastography imaging assessment is hard. No significant abnormalities were seen in the right axilla.   Accordingly on 09/28/2018 she proceeded to biopsy of the right breast area in question. The pathology from this procedure showed ([id number]): invasive ductal carcinoma, Grade III. Prognostic indicators significant for: estrogen receptor, 95% positive and progesterone receptor, 95% positive, both with strong staining intensity. Proliferation marker Ki67 at 15%. HER2 negative immunohistochemcal and morphometric analysis 1+.  The patient's subsequent history is as detailed below.   PAST MEDICAL HISTORY: Past Medical History:  Diagnosis Date   Allergy    Anemia    prior to hysterectomy   BCC (basal cell carcinoma) 08/01/2015   Breast cancer ([place]) 09/25/2018   right   Cancer ([place]) 2007   calf of right leg- melanoma   Cataract    Cervical cancer screening 06/06/2012   DDD (degenerative disc disease), cervical 08/01/2015   Family history of bladder cancer    Family history of breast cancer    Family history of colon cancer    Family history of ovarian cancer    Family history of prostate cancer    GERD (gastroesophageal reflux disease)    Hyperglycemia 03/28/2017   Hyperlipidemia    Hypertension 65   Hypothyroidism    Knee pain  03/30/2012   L>R   Low back pain 03/23/2018   Melanoma of skin ([place]) 08/19/2016   PONV (postoperative nausea and vomiting)    Thyroid disease 64   Vitamin D deficiency 06/06/2012    PAST SURGICAL HISTORY: Past  Surgical History:  Procedure Laterality Date   ABDOMINAL HYSTERECTOMY  1990's   total, for heavy bleeding and fibroids   BASAL CELL CARCINOMA EXCISION     BREAST LUMPECTOMY WITH RADIOACTIVE SEED AND SENTINEL LYMPH NODE BIOPSY Right 11/07/2018   Procedure: RIGHT BREAST LUMPECTOMY WITH RADIOACTIVE SEED AND SENTINEL LYMPH NODE BIOPSY;  Surgeon: [name], MD;  Location: [facility];  Service: General;  Laterality: Right;   BREAST SURGERY  early 70's   fibroid tumors removed- benign   CATARACT EXTRACTION Bilateral 12/09/12   COLONOSCOPY     EYE SURGERY Bilateral 2015   cataracts   MELANOMA EXCISION  2007    FAMILY HISTORY: Family History  Problem Relation Age of Onset   Hypertension Mother    Alzheimer's disease Mother    Dementia Mother        alzheimer's   Aortic aneurysm Father    Hypertension Father    COPD Father        smoker   Heart disease Father        s/p bypass, aortic aneurysm, carotid artery disease   Breast cancer Sister 20   Diabetes Son 22       type 1   Alzheimer's disease Maternal Grandmother    Prostate cancer Maternal Grandfather 60       metastatic   Stroke Paternal Grandfather    Ovarian cancer Maternal Aunt 75   Cancer Maternal Aunt        cervical or uterine   Colon cancer Paternal Uncle 69   Breast cancer Cousin 55   Breast cancer Cousin 55   Colon cancer Cousin        dx >50   Esophageal cancer Neg Hx    Pancreatic cancer Neg Hx    Rectal cancer Neg Hx    Stomach cancer Neg Hx   Her father died from [place] at age 23. Patients' mother died from alzheimer's at age 65. The patient has no brothers, 1 sister. Her sister, Tammy Grimes, had breast cancer at the age of 69.  Tammy Grimes, is employed with the [employer].One of their maternal aunts had ovarian cancer, and a second maternal aunt had either uterine or cervical cancer. Tammy Grimes had Melanoma in situ on her right calf.   GYNECOLOGIC HISTORY:  Menarche: 75 years old Age at  first live birth: 75 years old GX P: 1 LMP: No LMP recorded. Patient has had a hysterectomy. 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 Her husband, Yvone Neu, worked as a English as a second language teacher for an Retail buyer.  He has now retired.  Tammy Grimes has one son, Careers information officer, age 87, who lives in [place] and is a Advertising copywriter as well as a Art gallery manager.  The patient's granddaughter recently graduated from [employer] with the [place] in physical therapy and already has a great job.  She is planning to marry November 2023.   ADVANCED DIRECTIVES: Her husband, Yvone Neu is Medical  Power of Attorney (MPOA). If Yvone Neu is unable, then her son, Truddie Crumble, would assume MPOA.    HEALTH MAINTENANCE: Social History   Tobacco Use   Smoking status: Never   Smokeless tobacco: Never  Vaping Use   Vaping Use: Never used  Substance Use Topics   Alcohol use: No   Drug use: No    Colonoscopy: yes, 06/07/2017  PAP:   Bone density: yes, 07/25/2018   Allergies  Allergen Reactions   Penicillins Rash    Current Outpatient Medications  Medication Sig Dispense Refill   amLODipine (NORVASC) 5 MG tablet TAKE 1 TABLET BY MOUTH AT  BEDTIME 90 tablet 1   Ascorbic Acid (VITAMIN C) 1000 MG tablet Take 1,000 mg by mouth daily.     aspirin 81 MG tablet Take 81 mg by mouth daily.     calcium-vitamin D 250-100 MG-UNIT per tablet Take 1 tablet by mouth daily.     levothyroxine (SYNTHROID) 88 MCG tablet TAKE 1 TABLET BY MOUTH  DAILY 90 tablet 3   metoprolol tartrate (LOPRESSOR) 25 MG tablet TAKE ONE-HALF TABLET BY  MOUTH TWICE DAILY 90 tablet 1   Multiple Vitamins-Minerals (CENTRUM SILVER 50+WOMEN) TABS Use as directed     Omega-3 Fatty Acids  (OMEGA 3 500) 500 MG CAPS Take 1 capsule by mouth daily.     omeprazole (PRILOSEC) 20 MG capsule TAKE 1 CAPSULE BY MOUTH  DAILY 90 capsule 1   rosuvastatin (CRESTOR) 5 MG tablet TAKE 1 TABLET BY MOUTH  DAILY 90 tablet 3   tamoxifen (NOLVADEX) 20 MG tablet TAKE 1 TABLET BY MOUTH  DAILY 90 tablet 3   TURMERIC CURCUMIN PO Take 1,650 mg by mouth daily.     No current facility-administered medications for this visit.    OBJECTIVE: white woman in no acute distress  Vitals:   10/07/21 1327  BP: 129/85  Pulse: 65  Resp: 18  Temp: 97.7 F (36.5 C)  SpO2: 100%      Body mass index is 23.86 kg/m.   Wt Readings from Last 3 Encounters:  10/07/21 126 lb 4.8 oz (57.3 kg)  07/27/21 130 lb 12.8 oz (59.3 kg)  01/29/21 131 lb 0.6 oz (59.4 kg)      ECOG FS:1 - Symptomatic but completely ambulatory  Sclerae unicteric, EOMs intact Wearing a mask No cervical or supraclavicular adenopathy Lungs no rales or rhonchi Heart regular rate and rhythm Abd soft, nontender, positive bowel sounds MSK no focal spinal tenderness, no upper extremity lymphedema Neuro: nonfocal, well oriented, appropriate affect Breasts: The right breast has undergone lumpectomy and radiation.  There is no finding suspicious for disease recurrence.  Left breast and both axillae are benign.   LAB RESULTS:  CMP     Component Value Date/Time   NA 142 07/27/2021 0943   K 4.2 07/27/2021 0943   CL 106 07/27/2021 0943   CO2 25 07/27/2021 0943   GLUCOSE 102 (H) 07/27/2021 0943   BUN 19 07/27/2021 0943   CREATININE 0.70 07/27/2021 0943   CREATININE 0.63 07/22/2020 1008   CALCIUM 9.4 07/27/2021 0943   PROT 6.8 07/27/2021 0943   ALBUMIN 4.5 07/27/2021 0943   AST 79 (H) 07/27/2021 0943   AST 21 05/22/2019 1213   ALT 95 (H) 07/27/2021 0943   ALT 23 05/22/2019 1213   ALKPHOS 46 07/27/2021 0943   BILITOT 0.6 07/27/2021 0943   BILITOT 0.6 05/22/2019 1213   GFRNONAA >60 09/18/2020 1245   GFRNONAA >60 05/22/2019 1213   GFRAA  >  60 09/18/2019 1340   GFRAA >60 05/22/2019 1213    Lab Results  Component Value Date   WBC 5.7 10/07/2021   NEUTROABS 3.4 10/07/2021   HGB 12.3 10/07/2021   HCT 37.3 10/07/2021   MCV 95.2 10/07/2021   PLT 159 10/07/2021   No results found for: LABCA2  No components found for: MWNUUV253  No results for input(s): INR in the last 168 hours.  No results found for: LABCA2  No results found for: GUY403  No results found for: KVQ259  No results found for: DGL875  No results found for: CA2729  No components found for: HGQUANT  No results found for: CEA1 / No results found for: CEA1   No results found for: AFPTUMOR  No results found for: CHROMOGRNA  No results found for: TOTALPROTELP, ALBUMINELP, A1GS, A2GS, BETS, BETA2SER, GAMS, MSPIKE, SPEI (this displays SPEP labs)  No results found for: KPAFRELGTCHN, LAMBDASER, KAPLAMBRATIO (kappa/lambda light chains)  No results found for: HGBA, HGBA2QUANT, HGBFQUANT, HGBSQUAN (Hemoglobinopathy evaluation)   No results found for: LDH  No results found for: IRON, TIBC, IRONPCTSAT (Iron and TIBC)  No results found for: FERRITIN  Urinalysis No results found for: COLORURINE, APPEARANCEUR, LABSPEC, PHURINE, GLUCOSEU, HGBUR, BILIRUBINUR, KETONESUR, PROTEINUR, UROBILINOGEN, NITRITE, LEUKOCYTESUR   STUDIES:  No results found.  Personal History Of Breast Cancer Personal history of treated right breast carcinoma, status post right lumpectomy 2019 Lower Inner Quadrant.  Digital breast tomosynthesis imaging has been obtained as part of this examination. Comparison is made to exam(s) dated: 09/13/2019, 09/15/2020. Breast Composition Category C: The breast tissue is heterogeneously dense, which may obscure small masses.  Current study was also evaluated with a Computer Aided Detection (CAD) system. Post surgical distortion and density at the lumpectomy site in the right upper outer breast are unchanged. Benign post operative  findings are seen in the left breast. No significant masses, calcifications, or other findings are seen in either breast.  IMPRESSION: BENIGN Stable right lumpectomy. There is no mammographic evidence of malignancy. Routine mammographic evaluation in 1 year is recommended.(09/11/2022)  This exam was interpreted at the [facility] location.  Robbi Garter M.D. sl/penrad:09/10/2021 10:43:17    ELIGIBLE FOR AVAILABLE RESEARCH PROTOCOL: no   ASSESSMENT: 75 y.o. [place] woman status post right breast lower inner quadrant biopsy 09/27/2018 for a clinical T1c N0 invasive ductal carcinoma, grade 3, estrogen and progesterone receptor positive, HER-2 not amplified, with an MIB-1 of 15%  (1) Genetic testing performed through [facility] Common Hereditary Cancers panel + Melanoma panel on 10/11/2018 showed no pathogenic mutations in APC, ATM, AXIN2, BAP1, BARD1, BMPR1A, BRCA1, BRCA2, BRIP1, BUB1B, CDH1, CDK4, CDKN2A, CHEK2, CTNNA1, DICER1, ENG, EPCAM, GALNT12, GREM1, HOXB13, KIT, MEN1, MITF, MLH1, MLH3, MSH2, MSH3, MSH6, MUTYH, NBN, NF1, NTHL1, PALB2, PDGFRA, PMS2, POLD1, POLE, POT1 PTEN, RAD50, RAD51C, RAD51D, RB1, RNF43, RPS20, SDHA, SDHB, SDHC, SDHD, SMAD4, SMARCA4, STK11, TP53, TSC1, TSC2, VHL.  (2) status post right lumpectomy and sentinel lymph node sampling 11/07/2018 for a pT2 pN0, stage IIA, grade 3 with a positive anterior margin  (a) a total of 7 lymph nodes were removed  (3) The Oncotype DX score was 10 predicting a risk of outside the breast recurrence over the next 9 years of 3% if the patient's only systemic therapy is tamoxifen for 5 years. It also predicts <1% benefit from chemotherapy.  (4) adjuvant radiation 12/14/2018 - 01/11/2019  (a) Right breast / 42.56 Gy in 16 fractions  (b) Seroma boost / 8 Gy in 4 fractions  (  5) started tamoxifen 02/07/2019  (a) DEXA scan at [facility] 07/25/2018 showed a T score of -2.0  (b) repeat DEXA scan at [facility] 09/15/2020 shows a T  score of -1.8   PLAN: Tammy Grimes is now just short of 3 years out from definitive surgery for her breast cancer with no evidence of disease recurrence.  This is very favorable.  She is tolerating tamoxifen well and the plan is to continue that a total of 5 years.  I am hopeful with the help of Dr. Nelva Bush she will be able to become more mobile, walk more regularly, and have less discomfort overall.  She will see [place] again in 1 year after her next mammogram.  She knows to call for any other issue that may develop before then.  Total encounter time 25 minutes.*   Jaylianna Tatlock, Virgie Dad, MD  10/07/21 1:41 PM Medical Oncology and Hematology [facility] [place], [place] [address] Tel. [phone]    Fax. [phone]    I, Wilburn Mylar, am acting as scribe for Dr. Virgie Dad. Harriet Bollen.  I, Lurline Del MD, have reviewed the above documentation for accuracy and completeness, and I agree with the above.   *Total Encounter Time as defined by the Centers for Medicare and Medicaid Services includes, in addition to the face-to-face time of a patient visit (documented in the note above) non-face-to-face time: obtaining and reviewing outside history, ordering and reviewing medications, tests or procedures, care coordination (communications with other health care professionals or caregivers) and documentation in the medical record.

## 2021-10-30 ENCOUNTER — Other Ambulatory Visit: Payer: Self-pay | Admitting: Family Medicine

## 2021-11-06 DIAGNOSIS — M47816 Spondylosis without myelopathy or radiculopathy, lumbar region: Secondary | ICD-10-CM | POA: Diagnosis not present

## 2021-11-06 DIAGNOSIS — M47896 Other spondylosis, lumbar region: Secondary | ICD-10-CM | POA: Diagnosis not present

## 2021-11-12 ENCOUNTER — Telehealth: Payer: Self-pay | Admitting: Family Medicine

## 2021-11-12 NOTE — Telephone Encounter (Signed)
I attampted to call patient. Unable to leave message(per DPR) for patient to call back and schedule Medicare Annual Wellness Visit (AWV) in office.   If not able to come in office, please offer to do virtually or by telephone.  Left office number and my jabber [phone].  Last AWV:09/26/2018  Please schedule at anytime with Nurse Health Advisor.

## 2021-11-17 ENCOUNTER — Ambulatory Visit (INDEPENDENT_AMBULATORY_CARE_PROVIDER_SITE_OTHER): Payer: Medicare Other

## 2021-11-17 VITALS — Ht 61.0 in | Wt 126.0 lb

## 2021-11-17 DIAGNOSIS — Z0000 Encounter for general adult medical examination without abnormal findings: Secondary | ICD-10-CM

## 2021-11-17 NOTE — Patient Instructions (Signed)
Tammy Grimes , Thank you for taking time to complete your Medicare Wellness Visit. I appreciate your ongoing commitment to your health goals. Please review the following plan we discussed and let me know if I can assist you in the future.   Screening recommendations/referrals: Colonoscopy: No longer required Mammogram: Completed 09/10/2021-Due-09/10/2022 Bone Density: Completed 08/15/2020-Due 08/15/2022 Recommended yearly ophthalmology/optometry visit for glaucoma screening and checkup Recommended yearly dental visit for hygiene and checkup  Vaccinations: Influenza vaccine: Up to date Pneumococcal vaccine: Up to date Tdap vaccine: Up to date Shingles vaccine: Completed vaccines   Covid-19:Booster available at the pharmacy  Advanced directives: Copy in chart  Conditions/risks identified: See problem list  Next appointment: Follow up in one year for your annual wellness visit    Preventive Care 65 Years and Older, Female Preventive care refers to lifestyle choices and visits with your health care provider that can promote health and wellness. What does preventive care include? A yearly physical exam. This is also called an annual well check. Dental exams once or twice a year. Routine eye exams. Ask your health care provider how often you should have your eyes checked. Personal lifestyle choices, including: Daily care of your teeth and gums. Regular physical activity. Eating a healthy diet. Avoiding tobacco and drug use. Limiting alcohol use. Practicing safe sex. Taking low-dose aspirin every day. Taking vitamin and mineral supplements as recommended by your health care provider. What happens during an annual well check? The services and screenings done by your health care provider during your annual well check will depend on your age, overall health, lifestyle risk factors, and family history of disease. Counseling  Your health care provider may ask you questions about  your: Alcohol use. Tobacco use. Drug use. Emotional well-being. Home and relationship well-being. Sexual activity. Eating habits. History of falls. Memory and ability to understand (cognition). Work and work Statistician. Reproductive health. Screening  You may have the following tests or measurements: Height, weight, and BMI. Blood pressure. Lipid and cholesterol levels. These may be checked every 5 years, or more frequently if you are over 74 years old. Skin check. Lung cancer screening. You may have this screening every year starting at age 74 if you have a 30-pack-year history of smoking and currently smoke or have quit within the past 15 years. Fecal occult blood test (FOBT) of the stool. You may have this test every year starting at age 74. Flexible sigmoidoscopy or colonoscopy. You may have a sigmoidoscopy every 5 years or a colonoscopy every 10 years starting at age 74. Hepatitis C blood test. Hepatitis B blood test. Sexually transmitted disease (STD) testing. Diabetes screening. This is done by checking your blood sugar (glucose) after you have not eaten for a while (fasting). You may have this done every 1-3 years. Bone density scan. This is done to screen for osteoporosis. You may have this done starting at age 74. Mammogram. This may be done every 1-2 years. Talk to your health care provider about how often you should have regular mammograms. Talk with your health care provider about your test results, treatment options, and if necessary, the need for more tests. Vaccines  Your health care provider may recommend certain vaccines, such as: Influenza vaccine. This is recommended every year. Tetanus, diphtheria, and acellular pertussis (Tdap, Td) vaccine. You may need a Td booster every 10 years. Zoster vaccine. You may need this after age 74. Pneumococcal 13-valent conjugate (PCV13) vaccine. One dose is recommended after age 74. Pneumococcal polysaccharide (PPSV23) vaccine.  One dose is recommended after age 74. Talk to your health care provider about which screenings and vaccines you need and how often you need them. This information is not intended to replace advice given to you by your health care provider. Make sure you discuss any questions you have with your health care provider. Document Released: 11/21/2015 Document Revised: 07/14/2016 Document Reviewed: 08/26/2015 Elsevier Interactive Patient Education  2017 [place] Prevention in the Home Falls can cause injuries. They can happen to people of all ages. There are many things you can do to make your home safe and to help prevent falls. What can I do on the outside of my home? Regularly fix the edges of walkways and driveways and fix any cracks. Remove anything that might make you trip as you walk through a door, such as a raised step or threshold. Trim any bushes or trees on the path to your home. Use bright outdoor lighting. Clear any walking paths of anything that might make someone trip, such as rocks or tools. Regularly check to see if handrails are loose or broken. Make sure that both sides of any steps have handrails. Any raised decks and porches should have guardrails on the edges. Have any leaves, snow, or ice cleared regularly. Use sand or salt on walking paths during winter. Clean up any spills in your garage right away. This includes oil or grease spills. What can I do in the bathroom? Use night lights. Install grab bars by the toilet and in the tub and shower. Do not use towel bars as grab bars. Use non-skid mats or decals in the tub or shower. If you need to sit down in the shower, use a plastic, non-slip stool. Keep the floor dry. Clean up any water that spills on the floor as soon as it happens. Remove soap buildup in the tub or shower regularly. Attach bath mats securely with double-sided non-slip rug tape. Do not have throw rugs and other things on the floor that can make  you trip. What can I do in the bedroom? Use night lights. Make sure that you have a light by your bed that is easy to reach. Do not use any sheets or blankets that are too big for your bed. They should not hang down onto the floor. Have a firm chair that has side arms. You can use this for support while you get dressed. Do not have throw rugs and other things on the floor that can make you trip. What can I do in the kitchen? Clean up any spills right away. Avoid walking on wet floors. Keep items that you use a lot in easy-to-reach places. If you need to reach something above you, use a strong step stool that has a grab bar. Keep electrical cords out of the way. Do not use floor polish or wax that makes floors slippery. If you must use wax, use non-skid floor wax. Do not have throw rugs and other things on the floor that can make you trip. What can I do with my stairs? Do not leave any items on the stairs. Make sure that there are handrails on both sides of the stairs and use them. Fix handrails that are broken or loose. Make sure that handrails are as long as the stairways. Check any carpeting to make sure that it is firmly attached to the stairs. Fix any carpet that is loose or worn. Avoid having throw rugs at the top or bottom of the  stairs. If you do have throw rugs, attach them to the floor with carpet tape. Make sure that you have a light switch at the top of the stairs and the bottom of the stairs. If you do not have them, ask someone to add them for you. What else can I do to help prevent falls? Wear shoes that: Do not have high heels. Have rubber bottoms. Are comfortable and fit you well. Are closed at the toe. Do not wear sandals. If you use a stepladder: Make sure that it is fully opened. Do not climb a closed stepladder. Make sure that both sides of the stepladder are locked into place. Ask someone to hold it for you, if possible. Clearly mark and make sure that you can  see: Any grab bars or handrails. First and last steps. Where the edge of each step is. Use tools that help you move around (mobility aids) if they are needed. These include: Canes. Walkers. Scooters. Crutches. Turn on the lights when you go into a dark area. Replace any light bulbs as soon as they burn out. Set up your furniture so you have a clear path. Avoid moving your furniture around. If any of your floors are uneven, fix them. If there are any pets around you, be aware of where they are. Review your medicines with your doctor. Some medicines can make you feel dizzy. This can increase your chance of falling. Ask your doctor what other things that you can do to help prevent falls. This information is not intended to replace advice given to you by your health care provider. Make sure you discuss any questions you have with your health care provider. Document Released: 08/21/2009 Document Revised: 04/01/2016 Document Reviewed: 11/29/2014 Elsevier Interactive Patient Education  2017 [employer].

## 2021-11-17 NOTE — Progress Notes (Signed)
Subjective:   Tammy Grimes is a 76 y.o. female who presents for Medicare Annual (Subsequent) preventive examination.  I connected with Jacqulyne today by telephone and verified that I am speaking with the correct person using two identifiers. Location patient: home Location provider: work Persons participating in the virtual visit: patient, Marine scientist.    I discussed the limitations, risks, security and privacy concerns of performing an evaluation and management service by telephone and the availability of in person appointments. I also discussed with the patient that there may be a patient responsible charge related to this service. The patient expressed understanding and verbally consented to this telephonic visit.    Interactive audio and video telecommunications were attempted between this provider and patient, however failed, due to patient having technical difficulties OR patient did not have access to video capability.  We continued and completed visit with audio only.  Some vital signs may be absent or patient reported.   Time Spent with patient on telephone encounter: 20 minutes   Review of Systems     Cardiac Risk Factors include: advanced age (>42men, >25 women);hypertension;dyslipidemia     Objective:    Today's Vitals   11/17/21 1422  Weight: 126 lb (57.2 kg)  Height: 5\' 1"  (1.549 m)   Body mass index is 23.81 kg/m.  Advanced Directives 11/17/2021 06/25/2019 12/07/2018 11/07/2018 10/26/2018 10/11/2018 09/26/2018  Does Patient Have a Medical Advance Directive? Yes No Yes Yes Yes Yes Yes  Type of Paramedic of [place];Living will - Living will;Healthcare Power of [place];Living will [place];Living will [place];Living will  Does patient want to make changes to medical advance directive? - - - No - Patient declined - - -  Copy of [place] in Chart? No - copy  requested - Yes - validated most recent copy scanned in chart (See row information) - - Yes - validated most recent copy scanned in chart (See row information) Yes - validated most recent copy scanned in chart (See row information)  Would patient like information on creating a medical advance directive? - No - Patient declined - - - - -    Current Medications (verified) Outpatient Encounter Medications as of 11/17/2021  Medication Sig   amLODipine (NORVASC) 5 MG tablet TAKE 1 TABLET BY MOUTH AT  BEDTIME   aspirin 81 MG tablet Take 81 mg by mouth daily.   calcium-vitamin D 250-100 MG-UNIT per tablet Take 1 tablet by mouth daily.   levothyroxine (SYNTHROID) 88 MCG tablet TAKE 1 TABLET BY MOUTH  DAILY   metoprolol tartrate (LOPRESSOR) 25 MG tablet TAKE ONE-HALF TABLET BY  MOUTH TWICE DAILY   Multiple Vitamins-Minerals (CENTRUM SILVER 50+WOMEN) TABS Use as directed   Omega-3 Fatty Acids (OMEGA 3 500) 500 MG CAPS Take 1 capsule by mouth daily.   omeprazole (PRILOSEC) 20 MG capsule TAKE 1 CAPSULE BY MOUTH  DAILY   rosuvastatin (CRESTOR) 5 MG tablet TAKE 1 TABLET BY MOUTH  DAILY   tamoxifen (NOLVADEX) 20 MG tablet TAKE 1 TABLET BY MOUTH  DAILY   TURMERIC CURCUMIN PO Take 1,650 mg by mouth daily.   Ascorbic Acid (VITAMIN C) 1000 MG tablet Take 1,000 mg by mouth daily.   No facility-administered encounter medications on file as of 11/17/2021.    Allergies (verified) Penicillins   History: Past Medical History:  Diagnosis Date   Allergy    Anemia    prior to hysterectomy  BCC (basal cell carcinoma) 08/01/2015   Breast cancer ([place]) 09/25/2018   right   Cancer ([place]) 2007   calf of right leg- melanoma   Cataract    Cervical cancer screening 06/06/2012   DDD (degenerative disc disease), cervical 08/01/2015   Family history of bladder cancer    Family history of breast cancer    Family history of colon cancer    Family history of ovarian cancer    Family history of prostate cancer    GERD  (gastroesophageal reflux disease)    Hyperglycemia 03/28/2017   Hyperlipidemia    Hypertension 65   Hypothyroidism    Knee pain 03/30/2012   L>R   Low back pain 03/23/2018   Melanoma of skin ([place]) 08/19/2016   PONV (postoperative nausea and vomiting)    Thyroid disease 64   Vitamin D deficiency 06/06/2012   Past Surgical History:  Procedure Laterality Date   ABDOMINAL HYSTERECTOMY  1990's   total, for heavy bleeding and fibroids   BASAL CELL CARCINOMA EXCISION     BREAST LUMPECTOMY WITH RADIOACTIVE SEED AND SENTINEL LYMPH NODE BIOPSY Right 11/07/2018   Procedure: RIGHT BREAST LUMPECTOMY WITH RADIOACTIVE SEED AND SENTINEL LYMPH NODE BIOPSY;  Surgeon: [name], MD;  Location: [facility];  Service: General;  Laterality: Right;   BREAST SURGERY  early 70's   fibroid tumors removed- benign   CATARACT EXTRACTION Bilateral 12/09/12   COLONOSCOPY     EYE SURGERY Bilateral 2015   cataracts   MELANOMA EXCISION  2007   Family History  Problem Relation Age of Onset   Hypertension Mother    Alzheimer's disease Mother    Dementia Mother        alzheimer's   Aortic aneurysm Father    Hypertension Father    COPD Father        smoker   Heart disease Father        s/p bypass, aortic aneurysm, carotid artery disease   Breast cancer Sister 23   Diabetes Son 22       type 1   Alzheimer's disease Maternal Grandmother    Prostate cancer Maternal Grandfather 60       metastatic   Stroke Paternal Grandfather    Ovarian cancer Maternal Aunt 75   Cancer Maternal Aunt        cervical or uterine   Colon cancer Paternal Uncle 35   Breast cancer Cousin 55   Breast cancer Cousin 55   Colon cancer Cousin        dx >50   Esophageal cancer Neg Hx    Pancreatic cancer Neg Hx    Rectal cancer Neg Hx    Stomach cancer Neg Hx    Social History   Socioeconomic History   Marital status: Married    Spouse name: Not on file   Number of children: Not on file   Years of education:  Not on file   Highest education level: Not on file  Occupational History   Not on file  Tobacco Use   Smoking status: Never   Smokeless tobacco: Never  Vaping Use   Vaping Use: Never used  Substance and Sexual Activity   Alcohol use: No   Drug use: No   Sexual activity: Yes    Comment: lives with husband, retired from [employer] work, no dietary restrictions  Other Topics Concern   Not on file  Social History Narrative   Not on file   Social Determinants  of Health   Financial Resource Strain: Low Risk    Difficulty of Paying Living Expenses: Not hard at all  Food Insecurity: No Food Insecurity   Worried About [place] in the Last Year: Never true   [place] in the Last Year: Never true  Transportation Needs: No Transportation Needs   Lack of Transportation (Medical): No   Lack of Transportation (Non-Medical): No  Physical Activity: Sufficiently Active   Days of Exercise per Week: 6 days   Minutes of Exercise per Session: 30 min  Stress: No Stress Concern Present   Feeling of Stress : Not at all  Social Connections: Moderately Integrated   Frequency of Communication with Friends and Family: More than three times a week   Frequency of Social Gatherings with Friends and Family: More than three times a week   Attends Religious Services: More than 4 times per year   Active Member of [employer] or Organizations: No   Attends Music therapist: Never   Marital Status: Married    Tobacco Counseling Counseling given: Not Answered   Clinical Intake:  Pre-visit preparation completed: Yes  Pain : No/denies pain     BMI - recorded: 23.81 Nutritional Status: BMI of 19-24  Normal Nutritional Risks: None Diabetes: No  How often do you need to have someone help you when you read instructions, pamphlets, or other written materials from your doctor or pharmacy?: 1 - Never  Diabetic?No  Interpreter Needed?: No  Information entered by ::  Caroleen Hamman LPN   Activities of Daily Living In your present state of health, do you have any difficulty performing the following activities: 11/17/2021 07/27/2021  Hearing? N N  Vision? N N  Difficulty concentrating or making decisions? N N  Walking or climbing stairs? - N  Dressing or bathing? N N  Doing errands, shopping? N N  Preparing Food and eating ? N -  Using the Toilet? N -  In the past six months, have you accidently leaked urine? N -  Do you have problems with loss of bowel control? N -  Managing your Medications? N -  Managing your Finances? N -  Housekeeping or managing your Housekeeping? N -  Some recent data might be hidden    Patient Care Team: [name], MD as PCP - General (Family Medicine) Martinique, Amy, MD as Consulting Physician (Dermatology) Rutherford Guys, MD as Consulting Physician (Ophthalmology) [name], MD as Consulting Physician (General Surgery) Magrinat, Virgie Dad, MD as Consulting Physician (Oncology) Kyung Rudd, MD as Consulting Physician (Radiation Oncology) Gatha Mayer, MD as Consulting Physician (Gastroenterology) Mauro Kaufmann, RN as Oncology Nurse Navigator Rockwell Germany, RN as Oncology Nurse Navigator Suella Broad, MD as Consulting Physician (Physical Medicine and Rehabilitation)  Indicate any recent Medical Services you may have received from other than [facility] providers in the past year (date may be approximate).     Assessment:   This is a routine wellness examination for Malynn.  Hearing/Vision screen Hearing Screening - Comments:: No issues Vision Screening - Comments:: Last eye exam-2021-Dr. Gershon Crane  Dietary issues and exercise activities discussed: Current Exercise Habits: Home exercise routine, Type of exercise: strength training/weights;stretching, Time (Minutes): 30, Frequency (Times/Week): 6, Weekly Exercise (Minutes/Week): 180, Intensity: Mild   Goals Addressed               This Visit's Progress      Patient Stated     Begin exercising again at least  2x/week (pt-stated)   On track     Other     Patient Stated        Maintain current health       Depression Screen PHQ 2/9 Scores 11/17/2021 07/27/2021 03/03/2020 09/26/2018 03/28/2017 01/30/2016 01/28/2015  PHQ - 2 Score 0 0 0 0 0 0 0  PHQ- 9 Score - - 0 - - - -    Fall Risk Fall Risk  11/17/2021 01/22/2021 07/22/2020 09/26/2018 06/22/2018  Falls in the past year? 0 0 - - No  Comment - - - - -  Number falls in past yr: 0 0 0 0 -  Injury with Fall? 0 0 0 - -  Comment - - - - -  Follow up Falls prevention discussed - - - -  Comment - - - - -    FALL RISK PREVENTION PERTAINING TO THE HOME:  Any stairs in or around the home? No  Home free of loose throw rugs in walkways, pet beds, electrical cords, etc? Yes  Adequate lighting in your home to reduce risk of falls? Yes   ASSISTIVE DEVICES UTILIZED TO PREVENT FALLS:  Life alert? No  Use of a cane, walker or w/c? No  Grab bars in the bathroom? No  Shower chair or bench in shower? No  Elevated toilet seat or a handicapped toilet? Yes elevated toilets  TIMED UP AND GO:  Was the test performed? No . Phone visit   Cognitive Function:Normal cognitive status assessed by this Nurse Health Advisor. No abnormalities found.          Immunizations Immunization History  Administered Date(s) Administered   Fluad Quad(high Dose 65+) 07/30/2019, 07/09/2020   Influenza Split 08/31/2012   Influenza Whole 08/09/2011   Influenza, High Dose Seasonal PF 08/19/2016, 08/22/2017, 07/24/2018   Influenza,inj,Quad PF,6+ Mos 07/24/2013, 08/01/2015   Influenza-Unspecified 07/24/2014, 08/22/2017, 08/30/2021   PFIZER Comirnaty(Gray Top)Covid-19 Tri-Sucrose Vaccine 04/16/2021   PFIZER(Purple Top)SARS-COV-2 Vaccination 11/30/2019, 12/21/2019, 08/22/2020   Pneumococcal Conjugate-13 07/24/2013   Pneumococcal Polysaccharide-23 08/01/2015   Td 11/08/2005   Tdap 01/30/2016   Zoster Recombinat  (Shingrix) 04/11/2020, 07/01/2020   Zoster, Live 06/07/2012    TDAP status: Up to date  Flu Vaccine status: Up to date  Pneumococcal vaccine status: Up to date  Covid-19 vaccine status: Information provided on how to obtain vaccines.   Qualifies for Shingles Vaccine? No   Zostavax completed Yes   Shingrix Completed?: Yes  Screening Tests Health Maintenance  Topic Date Due   COVID-19 Vaccine (5 - Booster for Pfizer series) 06/11/2021   MAMMOGRAM  09/10/2022   TETANUS/TDAP  01/29/2026   Pneumonia Vaccine 81+ Years old  Completed   INFLUENZA VACCINE  Completed   DEXA SCAN  Completed   Hepatitis C Screening  Completed   Zoster Vaccines- Shingrix  Completed   HPV VACCINES  Aged Out    Health Maintenance  Health Maintenance Due  Topic Date Due   COVID-19 Vaccine (5 - Booster for [place] series) 06/11/2021    Colorectal cancer screening: No longer required.   Mammogram status: Completed bilateral 09/10/2021. Repeat every year  Bone Density status: Completed 08/15/2020. Results reflect: Bone density results: OSTEOPENIA. Repeat every 2 years.  Lung Cancer Screening: (Low Dose CT Chest recommended if Age 57-80 years, 30 pack-year currently smoking OR have quit w/in 15years.) does not qualify.     Additional Screening:  Hepatitis C Screening: Completed 03/28/2017  Vision Screening: Recommended annual ophthalmology exams for early detection of glaucoma and  other disorders of the eye. Is the patient up to date with their annual eye exam?  No  Who is the provider or what is the name of the office in which the patient attends annual eye exams? Dr. Timothy Lasso advised to make an appt   Dental Screening: Recommended annual dental exams for proper oral hygiene  Community Resource Referral / Chronic Care Management: CRR required this visit?  No   CCM required this visit?  No      Plan:     I have personally reviewed and noted the following in the patients chart:    Medical and social history Use of alcohol, tobacco or illicit drugs  Current medications and supplements including opioid prescriptions.  Functional ability and status Nutritional status Physical activity Advanced directives List of other physicians Hospitalizations, surgeries, and ER visits in previous 12 months Vitals Screenings to include cognitive, depression, and falls Referrals and appointments  In addition, I have reviewed and discussed with patient certain preventive protocols, quality metrics, and best practice recommendations. A written personalized care plan for preventive services as well as general preventive health recommendations were provided to patient.   Due to this being a telephonic visit, the after visit summary with patients personalized plan was offered to patient via mail or my-chart. Patient would like to access on my-chart.   Marta Antu, LPN   [id number]  Nurse Health Advisor  Nurse Notes: None

## 2021-12-01 DIAGNOSIS — M5416 Radiculopathy, lumbar region: Secondary | ICD-10-CM | POA: Diagnosis not present

## 2021-12-10 DIAGNOSIS — M5416 Radiculopathy, lumbar region: Secondary | ICD-10-CM | POA: Diagnosis not present

## 2022-01-13 DIAGNOSIS — L821 Other seborrheic keratosis: Secondary | ICD-10-CM | POA: Diagnosis not present

## 2022-01-13 DIAGNOSIS — D1801 Hemangioma of skin and subcutaneous tissue: Secondary | ICD-10-CM | POA: Diagnosis not present

## 2022-01-13 DIAGNOSIS — Z85828 Personal history of other malignant neoplasm of skin: Secondary | ICD-10-CM | POA: Diagnosis not present

## 2022-01-13 DIAGNOSIS — L820 Inflamed seborrheic keratosis: Secondary | ICD-10-CM | POA: Diagnosis not present

## 2022-01-20 ENCOUNTER — Other Ambulatory Visit: Payer: Self-pay | Admitting: Family Medicine

## 2022-01-22 DIAGNOSIS — M47816 Spondylosis without myelopathy or radiculopathy, lumbar region: Secondary | ICD-10-CM | POA: Diagnosis not present

## 2022-01-22 DIAGNOSIS — M5416 Radiculopathy, lumbar region: Secondary | ICD-10-CM | POA: Diagnosis not present

## 2022-01-28 ENCOUNTER — Other Ambulatory Visit: Payer: Self-pay | Admitting: Family Medicine

## 2022-02-11 ENCOUNTER — Other Ambulatory Visit: Payer: Self-pay | Admitting: Family Medicine

## 2022-02-22 ENCOUNTER — Encounter: Payer: Self-pay | Admitting: Family Medicine

## 2022-02-22 ENCOUNTER — Encounter: Payer: Medicare Other | Admitting: Family Medicine

## 2022-02-22 ENCOUNTER — Ambulatory Visit (INDEPENDENT_AMBULATORY_CARE_PROVIDER_SITE_OTHER): Payer: Medicare Other | Admitting: Family Medicine

## 2022-02-22 VITALS — BP 101/54 | HR 73 | Ht 61.0 in | Wt 125.0 lb

## 2022-02-22 DIAGNOSIS — E559 Vitamin D deficiency, unspecified: Secondary | ICD-10-CM

## 2022-02-22 DIAGNOSIS — I10 Essential (primary) hypertension: Secondary | ICD-10-CM

## 2022-02-22 DIAGNOSIS — R7303 Prediabetes: Secondary | ICD-10-CM | POA: Diagnosis not present

## 2022-02-22 DIAGNOSIS — Z0000 Encounter for general adult medical examination without abnormal findings: Secondary | ICD-10-CM | POA: Diagnosis not present

## 2022-02-22 DIAGNOSIS — E039 Hypothyroidism, unspecified: Secondary | ICD-10-CM | POA: Diagnosis not present

## 2022-02-22 LAB — COMPREHENSIVE METABOLIC PANEL
ALT: 47 U/L — ABNORMAL HIGH (ref 0–35)
AST: 39 U/L — ABNORMAL HIGH (ref 0–37)
Albumin: 4.5 g/dL (ref 3.5–5.2)
Alkaline Phosphatase: 43 U/L (ref 39–117)
BUN: 11 mg/dL (ref 6–23)
CO2: 27 mEq/L (ref 19–32)
Calcium: 9.4 mg/dL (ref 8.4–10.5)
Chloride: 105 mEq/L (ref 96–112)
Creatinine, Ser: 0.7 mg/dL (ref 0.40–1.20)
GFR: 85.3 mL/min (ref >60.00)
Glucose, Bld: 102 mg/dL — ABNORMAL HIGH (ref 70–99)
Potassium: 4.6 mEq/L (ref 3.5–5.1)
Sodium: 141 mEq/L (ref 135–145)
Total Bilirubin: 0.5 mg/dL (ref 0.2–1.2)
Total Protein: 6.5 g/dL (ref 6.0–8.3)

## 2022-02-22 LAB — LIPID PANEL
Cholesterol: 115 mg/dL (ref 0–200)
HDL: 46.6 mg/dL (ref >39.00)
LDL Cholesterol: 46 mg/dL (ref 0–99)
NonHDL: 68.17
Total CHOL/HDL Ratio: 2
Triglycerides: 111 mg/dL (ref 0.0–149.0)
VLDL: 22.2 mg/dL (ref 0.0–40.0)

## 2022-02-22 LAB — CBC
HCT: 39.2 % (ref 36.0–46.0)
Hemoglobin: 12.9 g/dL (ref 12.0–15.0)
MCHC: 33.1 g/dL (ref 30.0–36.0)
MCV: 96.4 fl (ref 78.0–100.0)
Platelets: 141 10*3/uL — ABNORMAL LOW (ref 150.0–400.0)
RBC: 4.06 Mil/uL (ref 3.87–5.11)
RDW: 13 % (ref 11.5–15.5)
WBC: 5.7 10*3/uL (ref 4.0–10.5)

## 2022-02-22 LAB — TSH: TSH: 1.85 u[IU]/mL (ref 0.35–5.50)

## 2022-02-22 LAB — HEMOGLOBIN A1C: Hgb A1c MFr Bld: 5.8 % (ref 4.6–6.5)

## 2022-02-22 NOTE — Progress Notes (Signed)
? ?BP (!) 101/54   Pulse 73   Ht '5\' 1"'$  (1.549 m)   Wt 125 lb (56.7 kg)   BMI 23.62 kg/m?   ? ?Subjective:  ? ? Patient ID: Tammy Grimes, female    DOB: 16-Mar-1946, 76 y.o.   MRN: [id number] ? ?HPI: ?Tammy Grimes is a 76 y.o. female presenting on 02/22/2022 for comprehensive medical examination. Current medical complaints include: none ? ?HTN/HLD/GERD - well controlled, no problems.  ? ?She currently lives with: husband  ?Interim Problems from her last visit: no  ? ?She reports regular vision exams q1-5y: yes ?She reports regular dental exams q 69m yes ?Her diet consists of: good variety, healthy  ?She endorses exercise and/or activity of: daily home exercises  ?She works at: retired  ? ?She denies ETOH use. ?She denies nictoine use. ?She denies illegal substance use.  ? ? ?She reports menopausal ?Current menopausal symptoms: no ?She denies  concerns today about STI ?Contraception choices are: n/a ? ?She denies concerns about skin changes today. ?She denies concerns about bowel changes today. ?She denies concerns about bladder changes today. ? ? Depression Screen done today and results listed below:  ? ?  02/22/2022  ?  9:49 AM 11/17/2021  ?  2:29 PM 07/27/2021  ?  9:08 AM 03/03/2020  ?  2:14 PM 09/26/2018  ? 10:26 AM  ?Depression screen PHQ 2/9  ?Decreased Interest 0 0 0 0 0  ?Down, Depressed, Hopeless 0 0 0 0 0  ?PHQ - 2 Score 0 0 0 0 0  ?Altered sleeping    0   ?Tired, decreased energy    0   ?Change in appetite    0   ?Feeling bad or failure about yourself     0   ?Trouble concentrating    0   ?Moving slowly or fidgety/restless    0   ?Suicidal thoughts    0   ?PHQ-9 Score    0   ? ? ?She does not have a history of falls. ? ? ? ?Past Medical History:  ?Past Medical History:  ?Diagnosis Date  ? Allergy   ? Anemia   ? prior to hysterectomy  ? BCC (basal cell carcinoma) 08/01/2015  ? [place]  ? right  ? Cancer [facility]  ? calf of right leg- melanoma  ? Cataract   ? Cervical cancer  screening 06/06/2012  ? DDD (degenerative disc disease), cervical 08/01/2015  ? Family history of bladder cancer   ? Family history of breast cancer   ? Family history of colon cancer   ? Family history of ovarian cancer   ? Family history of prostate cancer   ? GERD (gastroesophageal reflux disease)   ? Hyperglycemia 03/28/2017  ? Hyperlipidemia   ? Hypertension 65  ? Hypothyroidism   ? Knee pain 03/30/2012  ? L>R  ? Low back pain 03/23/2018  ? [place] 08/19/[place]  ? PONV (postoperative nausea and vomiting)   ? Thyroid disease 64  ? Vitamin D deficiency 06/06/2012  ? ? ?Surgical History:  ?Past Surgical History:  ?Procedure Laterality Date  ? ABDOMINAL HYSTERECTOMY  1990's  ? total, for heavy bleeding and fibroids  ? BASAL CELL CARCINOMA EXCISION    ? BREAST LUMPECTOMY WITH RADIOACTIVE SEED AND SENTINEL LYMPH NODE BIOPSY Right 11/07/2018  ? Procedure: RIGHT BREAST LUMPECTOMY WITH RADIOACTIVE SEED AND SENTINEL LYMPH NODE BIOPSY;  Surgeon: BStark Klein MD;  Location: [facility];  Service: General;  Laterality: Right;  ? BREAST SURGERY  early 70's  ? fibroid tumors removed- benign  ? CATARACT EXTRACTION Bilateral 12/09/12  ? COLONOSCOPY    ? EYE SURGERY Bilateral 2015  ? cataracts  ? MELANOMA EXCISION  2007  ? ? ?Medications:  ?Current Outpatient Medications on File Prior to Visit  ?Medication Sig  ? cetirizine (ZYRTEC) 10 MG tablet Take 10 mg by mouth daily as needed for allergies.  ? amLODipine (NORVASC) 5 MG tablet TAKE 1 TABLET BY MOUTH AT  BEDTIME  ? aspirin 81 MG tablet Take 81 mg by mouth daily.  ? calcium-vitamin D 250-100 MG-UNIT per tablet Take 1 tablet by mouth daily.  ? levothyroxine (SYNTHROID) 88 MCG tablet TAKE 1 TABLET BY MOUTH  DAILY  ? metoprolol tartrate (LOPRESSOR) 25 MG tablet TAKE ONE-HALF TABLET BY MOUTH  TWICE DAILY  ? Multiple Vitamins-Minerals (CENTRUM SILVER 50+WOMEN) TABS Use as directed  ? Omega-3 Fatty Acids (OMEGA 3 500) 500 MG CAPS Take 1 capsule by mouth daily.   ? omeprazole (PRILOSEC) 20 MG capsule TAKE 1 CAPSULE BY MOUTH  DAILY  ? rosuvastatin (CRESTOR) 5 MG tablet TAKE 1 TABLET BY MOUTH  DAILY  ? tamoxifen (NOLVADEX) 20 MG tablet TAKE 1 TABLET BY MOUTH  DAILY  ? TURMERIC CURCUMIN PO Take 1,650 mg by mouth daily.  ? ?No current facility-administered medications on file prior to visit.  ? ? ?Allergies:  ?Allergies  ?Allergen Reactions  ? Penicillins Rash  ? ? ?Social History:  ?Social History  ? ?Socioeconomic History  ? Marital status: Married  ?  Spouse name: Not on file  ? Number of children: Not on file  ? Years of education: Not on file  ? Highest education level: Not on file  ?Occupational History  ? Not on file  ?Tobacco Use  ? Smoking status: Never  ? Smokeless tobacco: Never  ?Vaping Use  ? Vaping Use: Never used  ?Substance and Sexual Activity  ? Alcohol use: No  ? Drug use: No  ? Sexual activity: Yes  ?  Comment: lives with husband, retired from [employer] work, no dietary restrictions  ?Other Topics Concern  ? Not on file  ?Social History Narrative  ? Not on file  ? ?Social Determinants of Health  ? ?Financial Resource Strain: Low Risk   ? Difficulty of Paying Living Expenses: Not hard at all  ?Food Insecurity: No Food Insecurity  ? Worried About Char[place] fundraiser in the Last Year: Never true  ? Ran Out of Food in the Last Year: Never true  ?Transportation Needs: No Transportation Needs  ? Lack of Transportation (Medical): No  ? Lack of Transportation (Non-Medical): No  ?Physical Activity: Sufficiently Active  ? Days of Exercise per Week: 6 days  ? Minutes of Exercise per Session: 30 min  ?Stress: No Stress Concern Present  ? Feeling of Stress : Not at all  ?Social Connections: Moderately Integrated  ? Frequency of Communication with Friends and Family: More than three times a week  ? Frequency of Social Gatherings with Friends and Family: More than three times a week  ? Attends Religious Services: More than 4 times per year  ? Active Member of Clubs or  Organizations: No  ? Attends Archivist Meetings: Never  ? Marital Status: Married  ?Intimate Partner Violence: Not At Risk  ? Fear of Current or Ex-Partner: No  ? Emotionally Abused: No  ? Physically Abused: No  ?  Sexually Abused: No  ? ?Social History  ? ?Tobacco Use  ?Smoking Status Never  ?Smokeless Tobacco Never  ? ?Social History  ? ?Substance and Sexual Activity  ?Alcohol Use No  ? ? ?Family History:  ?Family History  ?Problem Relation Age of Onset  ? Hypertension Mother   ? Alzheimer's disease Mother   ? Dementia Mother   ?     alzheimer's  ? Aortic aneurysm Father   ? Hypertension Father   ? COPD Father   ?     smoker  ? Heart disease Father   ?     s/p bypass, aortic aneurysm, carotid artery disease  ? Breast cancer Sister 97  ? Diabetes Son 51  ?     type 1  ? Alzheimer's disease Maternal Grandmother   ? Prostate cancer Maternal Grandfather 60  ?     metastatic  ? Stroke Paternal Grandfather   ? Ovarian cancer Maternal Aunt 75  ? Cancer Maternal Aunt   ?     cervical or uterine  ? Colon cancer Paternal Uncle 22  ? Breast cancer Cousin 54  ? Breast cancer Cousin 17  ? Colon cancer Cousin   ?     dx >50  ? Esophageal cancer Neg Hx   ? Pancreatic cancer Neg Hx   ? Rectal cancer Neg Hx   ? Stomach cancer Neg Hx   ? ? ?Past medical history, surgical history, medications, allergies, family history and social history reviewed with patient today and changes made to appropriate areas of the chart.  ? ?All ROS negative except what is listed above and in the HPI.  ? ?   ?Objective:  ?  ?BP (!) 101/54   Pulse 73   Ht '5\' 1"'$  (1.549 m)   Wt 125 lb (56.7 kg)   BMI 23.62 kg/m?   ?Wt Readings from Last 3 Encounters:  ?02/22/22 125 lb (56.7 kg)  ?11/17/21 126 lb (57.2 kg)  ?10/07/21 126 lb 4.8 oz (57.3 kg)  ?  ?Physical Exam ?Vitals reviewed.  ?Constitutional:   ?   Appearance: Normal appearance. She is normal weight.  ?HENT:  ?   Head: Normocephalic and atraumatic.  ?   Right Ear: There is impacted cerumen.   ?   Left Ear: Tympanic membrane normal.  ?   Nose: Nose normal.  ?   Mouth/Throat:  ?   Mouth: Mucous membranes are moist.  ?   Pharynx: Oropharynx is clear.  ?Eyes:  ?   Extraocular Movements: Extraocular movemen

## 2022-02-22 NOTE — Patient Instructions (Addendum)
Nice to meet you today! Glad you are doing well. Updating labs. Please let us know if you need anything! ? ? ?Right ear with wax build up. Try Debrox drops over-the-counter to soften. Let us know if it becomes bothersome and we can try to irrigate it for you in the office.  ?

## 2022-02-25 ENCOUNTER — Other Ambulatory Visit: Payer: Self-pay

## 2022-02-25 MED ORDER — TAMOXIFEN CITRATE 20 MG PO TABS: 20.0000 mg | ORAL_TABLET | Freq: Every day | ORAL | 3 refills | Status: DC

## 2022-02-26 LAB — VITAMIN D 1,25 DIHYDROXY
Vitamin D 1, 25 (OH)2 Total: 46 pg/mL (ref 18–72)
Vitamin D2 1, 25 (OH)2: <8 pg/mL
Vitamin D3 1, 25 (OH)2: 46 pg/mL

## 2022-03-22 DIAGNOSIS — C50511 Malignant neoplasm of lower-outer quadrant of right female breast: Secondary | ICD-10-CM | POA: Diagnosis not present

## 2022-03-22 DIAGNOSIS — Z170 Estrogen receptor positive status [ER+]: Secondary | ICD-10-CM | POA: Diagnosis not present

## 2022-05-15 ENCOUNTER — Other Ambulatory Visit: Payer: Self-pay | Admitting: Family Medicine

## 2022-07-30 ENCOUNTER — Other Ambulatory Visit: Payer: Self-pay | Admitting: Family Medicine

## 2022-08-24 ENCOUNTER — Ambulatory Visit (INDEPENDENT_AMBULATORY_CARE_PROVIDER_SITE_OTHER): Payer: Medicare Other | Admitting: Family Medicine

## 2022-08-24 ENCOUNTER — Ambulatory Visit (HOSPITAL_BASED_OUTPATIENT_CLINIC_OR_DEPARTMENT_OTHER)
Admission: RE | Admit: 2022-08-24 | Discharge: 2022-08-24 | Disposition: A | Discharge status: Home / Self Care | Payer: Medicare Other | Source: Ambulatory Visit | Attending: Family Medicine | Admitting: Family Medicine

## 2022-08-24 VITALS — BP 105/60 | HR 74 | Temp 97.8°F | Resp 16 | Ht 61.0 in | Wt 127.4 lb

## 2022-08-24 DIAGNOSIS — E559 Vitamin D deficiency, unspecified: Secondary | ICD-10-CM

## 2022-08-24 DIAGNOSIS — M79605 Pain in left leg: Secondary | ICD-10-CM

## 2022-08-24 DIAGNOSIS — I10 Essential (primary) hypertension: Secondary | ICD-10-CM | POA: Diagnosis not present

## 2022-08-24 DIAGNOSIS — R739 Hyperglycemia, unspecified: Secondary | ICD-10-CM | POA: Diagnosis not present

## 2022-08-24 DIAGNOSIS — M5442 Lumbago with sciatica, left side: Secondary | ICD-10-CM | POA: Insufficient documentation

## 2022-08-24 DIAGNOSIS — M545 Low back pain, unspecified: Secondary | ICD-10-CM

## 2022-08-24 DIAGNOSIS — Z23 Encounter for immunization: Secondary | ICD-10-CM

## 2022-08-24 DIAGNOSIS — M25552 Pain in left hip: Secondary | ICD-10-CM | POA: Diagnosis not present

## 2022-08-24 DIAGNOSIS — Z780 Asymptomatic menopausal state: Secondary | ICD-10-CM

## 2022-08-24 DIAGNOSIS — E079 Disorder of thyroid, unspecified: Secondary | ICD-10-CM

## 2022-08-24 DIAGNOSIS — M8580 Other specified disorders of bone density and structure, unspecified site: Secondary | ICD-10-CM | POA: Diagnosis not present

## 2022-08-24 DIAGNOSIS — G8929 Other chronic pain: Secondary | ICD-10-CM | POA: Insufficient documentation

## 2022-08-24 DIAGNOSIS — E2839 Other primary ovarian failure: Secondary | ICD-10-CM | POA: Diagnosis not present

## 2022-08-24 DIAGNOSIS — E782 Mixed hyperlipidemia: Secondary | ICD-10-CM

## 2022-08-24 DIAGNOSIS — Z8041 Family history of malignant neoplasm of ovary: Secondary | ICD-10-CM

## 2022-08-24 DIAGNOSIS — E039 Hypothyroidism, unspecified: Secondary | ICD-10-CM

## 2022-08-24 LAB — COMPREHENSIVE METABOLIC PANEL
ALT: 36 U/L — ABNORMAL HIGH (ref 0–35)
AST: 31 U/L (ref 0–37)
Albumin: 4.6 g/dL (ref 3.5–5.2)
Alkaline Phosphatase: 40 U/L (ref 39–117)
BUN: 12 mg/dL (ref 6–23)
CO2: 27 mEq/L (ref 19–32)
Calcium: 9.6 mg/dL (ref 8.4–10.5)
Chloride: 106 mEq/L (ref 96–112)
Creatinine, Ser: 0.63 mg/dL (ref 0.40–1.20)
GFR: 87.19 mL/min (ref >60.00)
Glucose, Bld: 103 mg/dL — ABNORMAL HIGH (ref 70–99)
Potassium: 4.3 mEq/L (ref 3.5–5.1)
Sodium: 143 mEq/L (ref 135–145)
Total Bilirubin: 0.4 mg/dL (ref 0.2–1.2)
Total Protein: 6.7 g/dL (ref 6.0–8.3)

## 2022-08-24 LAB — CBC
HCT: 40.5 % (ref 36.0–46.0)
Hemoglobin: 13.4 g/dL (ref 12.0–15.0)
MCHC: 33.1 g/dL (ref 30.0–36.0)
MCV: 96.5 fl (ref 78.0–100.0)
Platelets: 145 10*3/uL — ABNORMAL LOW (ref 150.0–400.0)
RBC: 4.19 Mil/uL (ref 3.87–5.11)
RDW: 12.6 % (ref 11.5–15.5)
WBC: 6.2 10*3/uL (ref 4.0–10.5)

## 2022-08-24 LAB — LIPID PANEL
Cholesterol: 130 mg/dL (ref 0–200)
HDL: 55 mg/dL (ref >39.00)
LDL Cholesterol: 47 mg/dL (ref 0–99)
NonHDL: 74.59
Total CHOL/HDL Ratio: 2
Triglycerides: 137 mg/dL (ref 0.0–149.0)
VLDL: 27.4 mg/dL (ref 0.0–40.0)

## 2022-08-24 LAB — HEMOGLOBIN A1C: Hgb A1c MFr Bld: 5.8 % (ref 4.6–6.5)

## 2022-08-24 LAB — VITAMIN D 25 HYDROXY (VIT D DEFICIENCY, FRACTURES): VITD: 78.1 ng/mL (ref 30.00–100.00)

## 2022-08-24 LAB — TSH: TSH: 2.48 u[IU]/mL (ref 0.35–5.50)

## 2022-08-24 NOTE — Progress Notes (Signed)
Subjective:   By signing my name below, I, Kellie Simmering, attest that this documentation has been prepared under the direction and in the presence of [name], MD., 08/24/2022.     Patient ID: Tammy Grimes, female    DOB: Mar 27, 1946, 76 y.o.   MRN: [id number]  Chief Complaint  Patient presents with   Follow-up    Here for follow up   HPI Patient is in today for an office visit.  Allergies: She reports that she frequently experiences sinus pressure. She further reports that she experiences pain behind her left eye and stuffiness in both ears. She says that she has a headache during today's visit.  She currently takes Cetirizine 10 mg to manage her allergies and occasionally takes OTC sinus pressure medications.   Immunizations: She has been informed about receiving COVID-19 and RSV immunizations. She will receive the high-dose Flu immunization today.  Lower Back Pain: She complains of lower back pain that radiates down to her left leg and foot. She currently sees Dr. Suella Broad at [facility] and wears bilateral knee braces. She says that she experiences this lower back pain most days, but not every day. She also says that she awakens with this pain and that standing hurts.  Mammogram: She has a mammogram scheduled for 09/2022 and is inquiring when her next DEXA scan is due.  Past Medical History:  Diagnosis Date   Allergy    Anemia    prior to hysterectomy   BCC (basal cell carcinoma) 08/01/2015   Breast cancer ([place]) 09/25/2018   right   Cancer ([place]) 2007   calf of right leg- melanoma   Cataract    Cervical cancer screening 06/06/2012   DDD (degenerative disc disease), cervical 08/01/2015   Family history of bladder cancer    Family history of breast cancer    Family history of colon cancer    Family history of ovarian cancer    Family history of prostate cancer    GERD (gastroesophageal reflux disease)    Hyperglycemia 03/28/2017   Hyperlipidemia     Hypertension 65   Hypothyroidism    Knee pain 03/30/2012   L>R   Low back pain 03/23/2018   Melanoma of skin ([place]) 08/19/2016   PONV (postoperative nausea and vomiting)    Thyroid disease 64   Vitamin D deficiency 06/06/2012   Past Surgical History:  Procedure Laterality Date   ABDOMINAL HYSTERECTOMY  1990's   total, for heavy bleeding and fibroids   BASAL CELL CARCINOMA EXCISION     BREAST LUMPECTOMY WITH RADIOACTIVE SEED AND SENTINEL LYMPH NODE BIOPSY Right 11/07/2018   Procedure: RIGHT BREAST LUMPECTOMY WITH RADIOACTIVE SEED AND SENTINEL LYMPH NODE BIOPSY;  Surgeon: [name], MD;  Location: [facility];  Service: General;  Laterality: Right;   BREAST SURGERY  early 70's   fibroid tumors removed- benign   CATARACT EXTRACTION Bilateral 12/09/12   COLONOSCOPY     EYE SURGERY Bilateral 2015   cataracts   MELANOMA EXCISION  2007   Family History  Problem Relation Age of Onset   Hypertension Mother    Alzheimer's disease Mother    Dementia Mother        alzheimer's   Aortic aneurysm Father    Hypertension Father    COPD Father        smoker   Heart disease Father        s/p bypass, aortic aneurysm, carotid artery disease   Breast cancer Sister  59   Diabetes Son 22       type 1   Alzheimer's disease Maternal Grandmother    Prostate cancer Maternal Grandfather 60       metastatic   Stroke Paternal Grandfather    Ovarian cancer Maternal Aunt 75   Cancer Maternal Aunt        cervical or uterine   Colon cancer Paternal Uncle 77   Breast cancer Cousin 28   Breast cancer Cousin 69   Colon cancer Cousin        dx >50   Esophageal cancer Neg Hx    Pancreatic cancer Neg Hx    Rectal cancer Neg Hx    Stomach cancer Neg Hx    Social History   Socioeconomic History   Marital status: Married    Spouse name: Not on file   Number of children: Not on file   Years of education: Not on file   Highest education level: Not on file  Occupational History   Not on  file  Tobacco Use   Smoking status: Never   Smokeless tobacco: Never  Vaping Use   Vaping Use: Never used  Substance and Sexual Activity   Alcohol use: No   Drug use: No   Sexual activity: Yes    Comment: lives with husband, retired from [employer] work, no dietary restrictions  Other Topics Concern   Not on file  Social History Narrative   Not on file   Social Determinants of Health   Financial Resource Strain: [place]  (11/17/2021)   Overall Financial Resource Strain (CARDIA)    Difficulty of Paying Living Expenses: Not hard at all  Food Insecurity: No Food Insecurity (11/17/2021)   Hunger Vital Sign    Worried About Running Out of Food in the Last Year: Never true    [place] in the Last Year: Never true  Transportation Needs: No Transportation Needs (11/17/2021)   PRAPARE - Hydrologist (Medical): No    Lack of Transportation (Non-Medical): No  Physical Activity: Sufficiently Active (11/17/2021)   Exercise Vital Sign    Days of Exercise per Week: 6 days    Minutes of Exercise per Session: 30 min  Stress: No Stress Concern Present (11/17/2021)   [place]    Feeling of Stress : Not at all  Social Connections: Moderately Integrated (11/17/2021)   Social Connection and Isolation Panel [NHANES]    Frequency of Communication with Friends and Family: More than three times a week    Frequency of Social Gatherings with Friends and Family: More than three times a week    Attends Religious Services: More than 4 times per year    Active Member of [employer] or Organizations: No    Attends Archivist Meetings: Never    Marital Status: Married  Human resources officer Violence: Not At Risk (11/17/2021)   Humiliation, Afraid, Rape, and Kick questionnaire    Fear of Current or Ex-Partner: No    Emotionally Abused: No    Physically Abused: No    Sexually Abused: No   Outpatient  Medications Prior to Visit  Medication Sig Dispense Refill   amLODipine (NORVASC) 5 MG tablet TAKE 1 TABLET BY MOUTH AT  BEDTIME 90 tablet 3   aspirin 81 MG tablet Take 81 mg by mouth daily.     calcium-vitamin D 250-100 MG-UNIT per tablet Take 1 tablet by mouth daily.  cetirizine (ZYRTEC) 10 MG tablet Take 10 mg by mouth daily as needed for allergies.     levothyroxine (SYNTHROID) 88 MCG tablet TAKE 1 TABLET BY MOUTH  DAILY 100 tablet 2   metoprolol tartrate (LOPRESSOR) 25 MG tablet TAKE ONE-HALF TABLET BY MOUTH  TWICE DAILY 90 tablet 3   Multiple Vitamins-Minerals (CENTRUM SILVER 50+WOMEN) TABS Use as directed     Omega-3 Fatty Acids (OMEGA 3 500) 500 MG CAPS Take 1 capsule by mouth daily.     omeprazole (PRILOSEC) 20 MG capsule TAKE 1 CAPSULE BY MOUTH  DAILY 90 capsule 3   rosuvastatin (CRESTOR) 5 MG tablet TAKE 1 TABLET BY MOUTH DAILY 100 tablet 2   tamoxifen (NOLVADEX) 20 MG tablet Take 1 tablet (20 mg total) by mouth daily. 90 tablet 3   TURMERIC CURCUMIN PO Take 1,650 mg by mouth daily.     No facility-administered medications prior to visit.   Allergies  Allergen Reactions   Penicillins Rash   Review of Systems  Musculoskeletal:  Positive for back pain.       (+) Lower back pain. (+) Left foot and leg pain.    Neurological:  Positive for headaches.      Objective:    Physical Exam Constitutional:      General: She is not in acute distress.    Appearance: Normal appearance. She is not ill-appearing.  HENT:     Head: Normocephalic and atraumatic.     Right Ear: External ear normal.     Left Ear: External ear normal.     Mouth/Throat:     Mouth: Mucous membranes are moist.     Pharynx: Oropharynx is clear.  Eyes:     Extraocular Movements: Extraocular movements intact.     Pupils: Pupils are equal, round, and reactive to light.  Cardiovascular:     Rate and Rhythm: Normal rate and regular rhythm.     Pulses: Normal pulses.     Heart sounds: Normal heart sounds.  No murmur heard.    No gallop.  Pulmonary:     Effort: Pulmonary effort is normal. No respiratory distress.     Breath sounds: Normal breath sounds. No wheezing or rales.  Abdominal:     General: Bowel sounds are normal.  Skin:    General: Skin is warm and dry.  Neurological:     Mental Status: She is alert and oriented to person, place, and time.  Psychiatric:        Mood and Affect: Mood normal.        Behavior: Behavior normal.        Judgment: Judgment normal.    BP 105/60 (BP Location: Right Arm, Patient Position: Sitting, Cuff Size: Normal)   Pulse 74   Temp 97.8 F (36.6 C) (Oral)   Resp 16   Ht '5\' 1"'$  (1.549 m)   Wt 127 lb 6.4 oz (57.8 kg)   SpO2 98%   BMI 24.07 kg/m  Wt Readings from Last 3 Encounters:  08/24/22 127 lb 6.4 oz (57.8 kg)  02/22/22 125 lb (56.7 kg)  11/17/21 126 lb (57.2 kg)   Diabetic Foot Exam - Simple   No data filed    Lab Results  Component Value Date   WBC 6.2 08/24/2022   HGB 13.4 08/24/2022   HCT 40.5 08/24/2022   PLT 145.0 (L) 08/24/2022   GLUCOSE 103 (H) 08/24/2022   CHOL 130 08/24/2022   TRIG 137.0 08/24/2022   HDL 55.00 08/24/2022  LDLDIRECT 152.0 08/01/2015   LDLCALC 47 08/24/2022   ALT 36 (H) 08/24/2022   AST 31 08/24/2022   NA 143 08/24/2022   K 4.3 08/24/2022   CL 106 08/24/2022   CREATININE 0.63 08/24/2022   BUN 12 08/24/2022   CO2 27 08/24/2022   TSH 2.48 08/24/2022   HGBA1C 5.8 08/24/2022   Lab Results  Component Value Date   TSH 2.48 08/24/2022   Lab Results  Component Value Date   WBC 6.2 08/24/2022   HGB 13.4 08/24/2022   HCT 40.5 08/24/2022   MCV 96.5 08/24/2022   PLT 145.0 (L) 08/24/2022   Lab Results  Component Value Date   NA 143 08/24/2022   K 4.3 08/24/2022   CO2 27 08/24/2022   GLUCOSE 103 (H) 08/24/2022   BUN 12 08/24/2022   CREATININE 0.63 08/24/2022   BILITOT 0.4 08/24/2022   ALKPHOS 40 08/24/2022   AST 31 08/24/2022   ALT 36 (H) 08/24/2022   PROT 6.7 08/24/2022   ALBUMIN 4.6  08/24/2022   CALCIUM 9.6 08/24/2022   ANIONGAP 9 09/18/2020   GFR 87.19 08/24/2022   Lab Results  Component Value Date   CHOL 130 08/24/2022   Lab Results  Component Value Date   HDL 55.00 08/24/2022   Lab Results  Component Value Date   LDLCALC 47 08/24/2022   Lab Results  Component Value Date   TRIG 137.0 08/24/2022   Lab Results  Component Value Date   CHOLHDL 2 08/24/2022   Lab Results  Component Value Date   HGBA1C 5.8 08/24/2022      Assessment & Plan:   Problem List Items Addressed This Visit     Thyroid disease   Essential hypertension    Well controlled, no changes to meds. Encouraged heart healthy diet such as the DASH diet and exercise as tolerated.       Relevant Orders   CBC (Completed)   Hyperlipidemia    Encourage heart healthy diet such as MIND or DASH diet, increase exercise, avoid trans fats, simple carbohydrates and processed foods, consider a krill or fish or flaxseed oil cap daily.       Relevant Orders   Comprehensive metabolic panel (Completed)   Lipid panel (Completed)   Osteopenia    Encouraged to get adequate exercise, calcium and vitamin d intake      Vitamin D deficiency    Supplement and monitor      Relevant Orders   VITAMIN D 25 Hydroxy (Vit-D Deficiency, Fractures) (Completed)   Hyperglycemia    hgba1c acceptable, minimize simple carbs. Increase exercise as tolerated.       Relevant Orders   Hemoglobin A1c (Completed)   Family history of ovarian cancer - Primary   Low back pain radiating to left leg    Check xrays of lumbar spine and pelvis. She has been working with ortho and injections and ablations in posterior hip. She would like to have a consultation with sports medicine to see if they can offer her any relief. She is not interested in surgery. She would consider dry needling      Low back pain   Relevant Orders   DG Lumbar Spine 2-3 Views   DG HIPS BILAT W OR W/O PELVIS 2V   Ambulatory referral to Sports  Medicine   Hypothyroidism    On Levothyroxine, continue to monitor      Relevant Orders   TSH (Completed)   Other Visit Diagnoses     Estrogen  deficiency       Relevant Orders   DG Bone Density   Post-menopausal       Relevant Orders   DG Bone Density   Need for influenza vaccination       Relevant Orders   Flu Vaccine QUAD High Dose(Fluad) (Completed)      No orders of the defined types were placed in this encounter.  I, Penni Homans, MD, personally preformed the services described in this documentation.  All medical record entries made by the scribe were at my direction and in my presence.  I have reviewed the chart and discharge instructions (if applicable) and agree that the record reflects my personal performance and is accurate and complete. 08/24/2022  I,Mohammed Iqbal,acting as a scribe for Penni Homans, MD.,have documented all relevant documentation on the behalf of Penni Homans, MD,as directed by  Penni Homans, MD while in the presence of Penni Homans, MD.  Penni Homans, MD

## 2022-08-24 NOTE — Assessment & Plan Note (Signed)
Well controlled, no changes to meds. Encouraged heart healthy diet such as the DASH diet and exercise as tolerated.  °

## 2022-08-24 NOTE — Assessment & Plan Note (Signed)
On Levothyroxine, continue to monitor 

## 2022-08-24 NOTE — Assessment & Plan Note (Signed)
hgba1c acceptable, minimize simple carbs. Increase exercise as tolerated.  

## 2022-08-24 NOTE — Assessment & Plan Note (Signed)
Encourage heart healthy diet such as MIND or DASH diet, increase exercise, avoid trans fats, simple carbohydrates and processed foods, consider a krill or fish or flaxseed oil cap daily.  °

## 2022-08-24 NOTE — Assessment & Plan Note (Addendum)
Check xrays of lumbar spine and pelvis. She has been working with ortho and injections and ablations in posterior hip. She would like to have a consultation with sports medicine to see if they can offer her any relief. She is not interested in surgery. She would consider dry needling

## 2022-08-24 NOTE — Patient Instructions (Signed)
RSV (respiratory syncitial virus) vaccine at pharmacy, Arex[facility]  Covid booster when new version at pharmacy High dose flu shot given today  Space each shot by 2 weeks

## 2022-08-24 NOTE — Assessment & Plan Note (Signed)
Encouraged to get adequate exercise, calcium and vitamin d intake 

## 2022-08-24 NOTE — Assessment & Plan Note (Signed)
Supplement and monitor 

## 2022-08-26 ENCOUNTER — Ambulatory Visit: Payer: Medicare Other | Admitting: Family Medicine

## 2022-08-26 ENCOUNTER — Ambulatory Visit: Payer: Medicare Other | Admitting: Sports Medicine

## 2022-08-26 VITALS — BP 110/68 | HR 76 | Ht 61.0 in | Wt 128.0 lb

## 2022-08-26 DIAGNOSIS — M5442 Lumbago with sciatica, left side: Secondary | ICD-10-CM | POA: Diagnosis not present

## 2022-08-26 DIAGNOSIS — M5136 Other intervertebral disc degeneration, lumbar region: Secondary | ICD-10-CM | POA: Diagnosis not present

## 2022-08-26 DIAGNOSIS — G8929 Other chronic pain: Secondary | ICD-10-CM

## 2022-08-26 MED ORDER — MELOXICAM 7.5 MG PO TABS: 7.5000 mg | ORAL_TABLET | Freq: Every day | ORAL | 0 refills | Status: DC

## 2022-08-26 NOTE — Progress Notes (Signed)
Tammy Grimes D.[place] Phone: [phone]   Assessment and Plan:     1. Chronic left-sided low back pain with left-sided sciatica 2. DDD (degenerative disc disease), lumbar -Chronic with exacerbation, initial sports medicine visit - Most consistent with left-sided sciatica of lumbar origin based on HPI, physical exam, imaging - Patient has tried facet injections, nerve ablation, physical therapy in the past with only mild and temporary relief of symptoms with symptoms all fully returning afterwards - Patient is unsure if she has had an epidural steroid injection.  We will try to look into [facility] notes to see if she has had epidural in the past.  Patient states she had an MRI of lumbar spine in 03/2021.  We do not have these records and will try to obtain them before next visit - Start meloxicam 7.5 mg daily for the next 2 weeks.  May use Tylenol for breakthrough pain - After 2 weeks, discontinue meloxicam - After 2 weeks,  Start Tylenol 500 to 1000 mg tablets 2-3 times a day for day-to-day pain relief.  May use ibuprofen as needed for breakthrough pain.  Recommend not using it more than 1-2 times per week. - Start HEP for low back and core    Pertinent previous records reviewed include family medicine note 08/24/2022, lumbar spine x-ray 08/24/2022, bilateral hip x-ray 08/24/2022   Follow Up: 3 to 4 weeks for reevaluation.  Could consider OMT.  Would review lumbar MRI and see if patient has had epidural injection   Subjective:   I, Tammy Grimes, am serving as a Education administrator for Doctor Tammy Grimes  Chief Complaint: low back pain   HPI:   08/26/22 Patient is a 76 year old female complaining of low back pain. Patient states that its been going on for years radiates down her legs like sciatica , has had 6 injection since last june has ablation in December and nothing seems to stick, no numbness tingling  just pain, goes to the gym  and is fine but will have pain at night, tylenol helps take the edge off but doesn't take it away, pain is interrupting her quality of life, has had xray , sore all the time , some days are worse than the others , wears braces on knees due to knee pain, has a hard time standing for any amount of time, hx of subluxing patella,tired of getting stuck !!   Relevant Historical Information: History of breast cancer, hypertension, GERD  Additional pertinent review of systems negative.   Current Outpatient Medications:    amLODipine (NORVASC) 5 MG tablet, TAKE 1 TABLET BY MOUTH AT  BEDTIME, Disp: 90 tablet, Rfl: 3   aspirin 81 MG tablet, Take 81 mg by mouth daily., Disp: , Rfl:    calcium-vitamin D 250-100 MG-UNIT per tablet, Take 1 tablet by mouth daily., Disp: , Rfl:    cetirizine (ZYRTEC) 10 MG tablet, Take 10 mg by mouth daily as needed for allergies., Disp: , Rfl:    levothyroxine (SYNTHROID) 88 MCG tablet, TAKE 1 TABLET BY MOUTH  DAILY, Disp: 100 tablet, Rfl: 2   meloxicam (MOBIC) 7.5 MG tablet, Take 1 tablet (7.5 mg total) by mouth daily., Disp: 14 tablet, Rfl: 0   metoprolol tartrate (LOPRESSOR) 25 MG tablet, TAKE ONE-HALF TABLET BY MOUTH  TWICE DAILY, Disp: 90 tablet, Rfl: 3   Multiple Vitamins-Minerals (CENTRUM SILVER 50+WOMEN) TABS, Use as directed, Disp: , Rfl:  Omega-3 Fatty Acids (OMEGA 3 500) 500 MG CAPS, Take 1 capsule by mouth daily., Disp: , Rfl:    omeprazole (PRILOSEC) 20 MG capsule, TAKE 1 CAPSULE BY MOUTH  DAILY, Disp: 90 capsule, Rfl: 3   rosuvastatin (CRESTOR) 5 MG tablet, TAKE 1 TABLET BY MOUTH DAILY, Disp: 100 tablet, Rfl: 2   tamoxifen (NOLVADEX) 20 MG tablet, Take 1 tablet (20 mg total) by mouth daily., Disp: 90 tablet, Rfl: 3   TURMERIC CURCUMIN PO, Take 1,650 mg by mouth daily., Disp: , Rfl:    Objective:     Vitals:   08/26/22 1104  BP: 110/68  Pulse: 76  SpO2: 97%  Weight: 128 lb (58.1 kg)  Height: '5\' 1"'$  (1.549 m)      Body mass  index is 24.19 kg/m.    Physical Exam:    Gen: Appears well, nad, nontoxic and pleasant Psych: Alert and oriented, appropriate mood and affect Neuro: sensation intact, strength is 5/5 in upper and lower extremities, muscle tone wnl Skin: no susupicious lesions or rashes  Back - Normal skin, Spine with normal alignment and no deformity.   No tenderness to vertebral process palpation.   Lumbar paraspinous muscles are moderately tender on the left and mildly on the right and without spasm NTTP gluteal musculature Straight leg raise positive left, negative right     Electronically signed by:  Tammy Grimes D.[facility] 11:54 AM 08/26/22

## 2022-08-26 NOTE — Patient Instructions (Addendum)
Good to see you  - Start meloxicam 7.5 mg daily x2 weeks.  Do not to use additional NSAIDs while taking meloxicam.  May use Tylenol [phone] mg 2 to 3 times a day for breakthrough pain. After two weeks Tylenol [phone] mg 2-3 times a day for pain relief  My use ibuprofen no more than 1-2 times per week for breakthrough pain  Low back HEP  Medical release 3-4 week follow up

## 2022-09-15 DIAGNOSIS — M8589 Other specified disorders of bone density and structure, multiple sites: Secondary | ICD-10-CM | POA: Diagnosis not present

## 2022-09-15 DIAGNOSIS — M810 Age-related osteoporosis without current pathological fracture: Secondary | ICD-10-CM | POA: Diagnosis not present

## 2022-09-15 DIAGNOSIS — Z1231 Encounter for screening mammogram for malignant neoplasm of breast: Secondary | ICD-10-CM | POA: Diagnosis not present

## 2022-09-15 DIAGNOSIS — Z780 Asymptomatic menopausal state: Secondary | ICD-10-CM | POA: Diagnosis not present

## 2022-09-15 LAB — HM MAMMOGRAPHY

## 2022-09-17 NOTE — Progress Notes (Unsigned)
    Benito Mccreedy D.[place] Phone: [phone]   Assessment and Plan:     There are no diagnoses linked to this encounter.  ***   Pertinent previous records reviewed include ***   Follow Up: ***     Subjective:   I, Damira Kem, am serving as a Education administrator for Doctor Glennon Mac   Chief Complaint: low back pain    HPI:    08/26/22 Patient is a 76 year old female complaining of low back pain. Patient states that its been going on for years radiates down her legs like sciatica , has had 6 injection since last june has ablation in December and nothing seems to stick, no numbness tingling just pain, goes to the gym  and is fine but will have pain at night, tylenol helps take the edge off but doesn't take it away, pain is interrupting her quality of life, has had xray , sore all the time , some days are worse than the others , wears braces on knees due to knee pain, has a hard time standing for any amount of time, hx of subluxing patella,tired of getting stuck !!   09/20/2022 Patient states   Relevant Historical Information: History of breast cancer, hypertension, GERD  Additional pertinent review of systems negative.   Current Outpatient Medications:    amLODipine (NORVASC) 5 MG tablet, TAKE 1 TABLET BY MOUTH AT  BEDTIME, Disp: 90 tablet, Rfl: 3   aspirin 81 MG tablet, Take 81 mg by mouth daily., Disp: , Rfl:    calcium-vitamin D 250-100 MG-UNIT per tablet, Take 1 tablet by mouth daily., Disp: , Rfl:    cetirizine (ZYRTEC) 10 MG tablet, Take 10 mg by mouth daily as needed for allergies., Disp: , Rfl:    levothyroxine (SYNTHROID) 88 MCG tablet, TAKE 1 TABLET BY MOUTH  DAILY, Disp: 100 tablet, Rfl: 2   meloxicam (MOBIC) 7.5 MG tablet, Take 1 tablet (7.5 mg total) by mouth daily., Disp: 14 tablet, Rfl: 0   metoprolol tartrate (LOPRESSOR) 25 MG tablet, TAKE ONE-HALF TABLET BY MOUTH  TWICE DAILY, Disp: 90  tablet, Rfl: 3   Multiple Vitamins-Minerals (CENTRUM SILVER 50+WOMEN) TABS, Use as directed, Disp: , Rfl:    Omega-3 Fatty Acids (OMEGA 3 500) 500 MG CAPS, Take 1 capsule by mouth daily., Disp: , Rfl:    omeprazole (PRILOSEC) 20 MG capsule, TAKE 1 CAPSULE BY MOUTH  DAILY, Disp: 90 capsule, Rfl: 3   rosuvastatin (CRESTOR) 5 MG tablet, TAKE 1 TABLET BY MOUTH DAILY, Disp: 100 tablet, Rfl: 2   tamoxifen (NOLVADEX) 20 MG tablet, Take 1 tablet (20 mg total) by mouth daily., Disp: 90 tablet, Rfl: 3   TURMERIC CURCUMIN PO, Take 1,650 mg by mouth daily., Disp: , Rfl:    Objective:     There were no vitals filed for this visit.    There is no height or weight on file to calculate BMI.    Physical Exam:    ***   Electronically signed by:  Benito Mccreedy D.[name] 11:43 AM 09/17/22

## 2022-09-20 ENCOUNTER — Ambulatory Visit: Payer: Medicare Other | Admitting: Sports Medicine

## 2022-09-20 VITALS — BP 110/82 | HR 75 | Ht 61.0 in | Wt 129.0 lb

## 2022-09-20 DIAGNOSIS — M5442 Lumbago with sciatica, left side: Secondary | ICD-10-CM

## 2022-09-20 DIAGNOSIS — G8929 Other chronic pain: Secondary | ICD-10-CM

## 2022-09-20 DIAGNOSIS — M5136 Other intervertebral disc degeneration, lumbar region: Secondary | ICD-10-CM

## 2022-09-20 MED ORDER — MELOXICAM 7.5 MG PO TABS: 7.5000 mg | ORAL_TABLET | Freq: Every day | ORAL | 0 refills | Status: DC | PRN

## 2022-09-20 MED ORDER — MELOXICAM 7.5 MG PO TABS: 7.5000 mg | ORAL_TABLET | Freq: Every day | ORAL | 0 refills | Status: DC

## 2022-09-20 NOTE — Patient Instructions (Addendum)
Good to see you  Pt referral  Refill meloxicam recommend no more 1-2 times per week  Tylenol [phone] mg 2-3 times a day for pain relief  4 week follow up

## 2022-09-27 ENCOUNTER — Encounter: Payer: Self-pay | Admitting: Family Medicine

## 2022-09-27 DIAGNOSIS — Z961 Presence of intraocular lens: Secondary | ICD-10-CM | POA: Diagnosis not present

## 2022-09-27 DIAGNOSIS — H26493 Other secondary cataract, bilateral: Secondary | ICD-10-CM | POA: Diagnosis not present

## 2022-09-28 ENCOUNTER — Ambulatory Visit: Payer: Medicare Other | Admitting: Sports Medicine

## 2022-09-29 ENCOUNTER — Encounter: Payer: Self-pay | Admitting: Family Medicine

## 2022-09-29 NOTE — Telephone Encounter (Signed)
Called pt Lvm and sent her mychart

## 2022-10-04 DIAGNOSIS — Z0279 Encounter for issue of other medical certificate: Secondary | ICD-10-CM

## 2022-10-04 NOTE — Telephone Encounter (Signed)
Forms received  Placed in blyth bin up front  Patient would like form to be mailed to the address on the form  [place] [facility] [place], [place] [address]

## 2022-10-08 ENCOUNTER — Inpatient Hospital Stay: Payer: Medicare Other

## 2022-10-08 ENCOUNTER — Other Ambulatory Visit: Payer: Self-pay

## 2022-10-08 ENCOUNTER — Other Ambulatory Visit: Payer: Self-pay | Admitting: *Deleted

## 2022-10-08 ENCOUNTER — Inpatient Hospital Stay: Payer: Medicare Other | Attending: Hematology and Oncology | Admitting: Hematology and Oncology

## 2022-10-08 VITALS — BP 124/65 | HR 64 | Temp 98.1°F | Resp 16 | Ht 61.0 in | Wt 126.2 lb

## 2022-10-08 DIAGNOSIS — Z170 Estrogen receptor positive status [ER+]: Secondary | ICD-10-CM

## 2022-10-08 DIAGNOSIS — Z800 Family history of malignant neoplasm of digestive organs: Secondary | ICD-10-CM | POA: Diagnosis not present

## 2022-10-08 DIAGNOSIS — Z85820 Personal history of malignant melanoma of skin: Secondary | ICD-10-CM | POA: Diagnosis not present

## 2022-10-08 DIAGNOSIS — C50511 Malignant neoplasm of lower-outer quadrant of right female breast: Secondary | ICD-10-CM | POA: Diagnosis not present

## 2022-10-08 DIAGNOSIS — C50311 Malignant neoplasm of lower-inner quadrant of right female breast: Secondary | ICD-10-CM | POA: Diagnosis not present

## 2022-10-08 DIAGNOSIS — Z803 Family history of malignant neoplasm of breast: Secondary | ICD-10-CM | POA: Insufficient documentation

## 2022-10-08 DIAGNOSIS — Z85828 Personal history of other malignant neoplasm of skin: Secondary | ICD-10-CM | POA: Diagnosis not present

## 2022-10-08 DIAGNOSIS — Z79899 Other long term (current) drug therapy: Secondary | ICD-10-CM | POA: Diagnosis not present

## 2022-10-08 DIAGNOSIS — Z7982 Long term (current) use of aspirin: Secondary | ICD-10-CM | POA: Insufficient documentation

## 2022-10-08 DIAGNOSIS — I10 Essential (primary) hypertension: Secondary | ICD-10-CM | POA: Diagnosis not present

## 2022-10-08 DIAGNOSIS — E785 Hyperlipidemia, unspecified: Secondary | ICD-10-CM | POA: Insufficient documentation

## 2022-10-08 DIAGNOSIS — Z79810 Long term (current) use of selective estrogen receptor modulators (SERMs): Secondary | ICD-10-CM | POA: Diagnosis not present

## 2022-10-08 DIAGNOSIS — E039 Hypothyroidism, unspecified: Secondary | ICD-10-CM | POA: Diagnosis not present

## 2022-10-08 DIAGNOSIS — K219 Gastro-esophageal reflux disease without esophagitis: Secondary | ICD-10-CM | POA: Diagnosis not present

## 2022-10-08 DIAGNOSIS — Z79890 Hormone replacement therapy (postmenopausal): Secondary | ICD-10-CM | POA: Insufficient documentation

## 2022-10-08 LAB — CMP (CANCER CENTER ONLY)
ALT: 27 U/L (ref 0–44)
AST: 24 U/L (ref 15–41)
Albumin: 4.6 g/dL (ref 3.5–5.0)
Alkaline Phosphatase: 39 U/L (ref 38–126)
Anion gap: 8 (ref 5–15)
BUN: 17 mg/dL (ref 8–23)
CO2: 25 mmol/L (ref 22–32)
Calcium: 9.8 mg/dL (ref 8.9–10.3)
Chloride: 108 mmol/L (ref 98–111)
Creatinine: 0.69 mg/dL (ref 0.44–1.00)
GFR, Estimated: >60 mL/min (ref >60)
Glucose, Bld: 153 mg/dL — ABNORMAL HIGH (ref 70–99)
Potassium: 3.9 mmol/L (ref 3.5–5.1)
Sodium: 141 mmol/L (ref 135–145)
Total Bilirubin: 0.4 mg/dL (ref 0.3–1.2)
Total Protein: 6.9 g/dL (ref 6.5–8.1)

## 2022-10-08 LAB — CBC WITH DIFFERENTIAL (CANCER CENTER ONLY)
Abs Immature Granulocytes: 0.01 10*3/uL (ref 0.00–0.07)
Basophils Absolute: 0.1 10*3/uL (ref 0.0–0.1)
Basophils Relative: 1 %
Eosinophils Absolute: 0.2 10*3/uL (ref 0.0–0.5)
Eosinophils Relative: 4 %
HCT: 37.5 % (ref 36.0–46.0)
Hemoglobin: 12.5 g/dL (ref 12.0–15.0)
Immature Granulocytes: 0 %
Lymphocytes Relative: 31 %
Lymphs Abs: 1.8 10*3/uL (ref 0.7–4.0)
MCH: 32.4 pg (ref 26.0–34.0)
MCHC: 33.3 g/dL (ref 30.0–36.0)
MCV: 97.2 fL (ref 80.0–100.0)
Monocytes Absolute: 0.5 10*3/uL (ref 0.1–1.0)
Monocytes Relative: 9 %
Neutro Abs: 3.2 10*3/uL (ref 1.7–7.7)
Neutrophils Relative %: 55 %
Platelet Count: 158 10*3/uL (ref 150–400)
RBC: 3.86 MIL/uL — ABNORMAL LOW (ref 3.87–5.11)
RDW: 12.1 % (ref 11.5–15.5)
WBC Count: 5.8 10*3/uL (ref 4.0–10.5)
nRBC: 0 % (ref 0.0–0.2)

## 2022-10-08 NOTE — Progress Notes (Unsigned)
Tammy Grimes  [phone] Fax:(336) [phone]    ID: Tammy Grimes DOB: [birth year]  MR#: [id number]  PRF#:[id number]  Patient Tammy Team: Tammy Lukes, MD as PCP - General (Family Medicine) Martinique, Amy, MD as Consulting Physician (Dermatology) Tammy Guys, MD as Consulting Physician (Ophthalmology) Tammy Klein, MD as Consulting Physician (General Surgery) Magrinat, Virgie Dad, MD (Inactive) as Consulting Physician (Oncology) Tammy Rudd, MD as Consulting Physician (Radiation Oncology) Tammy Mayer, MD as Consulting Physician (Gastroenterology) Tammy Kaufmann, RN as Oncology Nurse Navigator Tammy Germany, RN as Oncology Nurse Navigator Tammy Broad, MD as Consulting Physician (Physical Medicine and Rehabilitation) OTHER MD: Tammy Grimes, Tammy Grimes DDS PA   CHIEF COMPLAINT: Estrogen receptor positive breast cancer  CURRENT TREATMENT: Tamoxifen   INTERVAL HISTORY:  Tammy Grimes returns today for follow-up of her estrogen receptor positive breast cancer.  She continues on tamoxifen.  She is tolerating this with no obvious side effects She also underwent bilateral diagnostic mammography with tomography at Tammy Grimes on 09/15/2022, breast density category C, no mammographic evidence of malignancy. Rest of the pertinent 10 point ROS reviewed and negative   COVID 19 VACCINATION STATUS: Pfizer x4, last 04/2021; infection 09/15/2021    HISTORY OF CURRENT ILLNESS: From the original intake note:  "Tammy Grimes" had routine screening mammography on 09/11/2018 showing a possible abnormality in the right breast. She underwent right unilateral diagnostic mammography with tomography and right breast ultrasonography at Tammy [facility]imes on 09/21/2018 showing: Breast Density Category C. 1.3 cm irregular focal asymmetry in the right breast lower inner aspect anterior depth 4 cm from the nipple noted on the mammogram, but on the ultrasound this measured at [2.8] cm. The irregular mass  is hypoechoic with posterior acoustic shadowing. Color flow imaging demonstrates increased vascularity. Elastography imaging assessment is hard. No significant abnormalities were seen in the right axilla.   Accordingly on 09/28/2018 she proceeded to biopsy of the right breast area in question. The pathology from this procedure showed (Tammy Grimes): invasive ductal carcinoma, Grade III. Prognostic indicators significant for: estrogen receptor, 95% positive and progesterone receptor, 95% positive, both with strong staining intensity. Proliferation marker Ki67 at 15%. HER2 negative immunohistochemcal and morphometric analysis 1+.  The patient's subsequent history is as detailed below.   PAST MEDICAL HISTORY: Past Medical History:  Diagnosis Date   Allergy    Anemia    prior to hysterectomy   BCC (basal cell carcinoma) 08/01/2015   Breast cancer ([place]) 09/25/2018   right   Cancer ([place]) 2007   calf of right leg- melanoma   Cataract    Cervical cancer screening 06/06/2012   DDD (degenerative disc disease), cervical 08/01/2015   Family history of bladder cancer    Family history of breast cancer    Family history of colon cancer    Family history of ovarian cancer    Family history of prostate cancer    GERD (gastroesophageal reflux disease)    Hyperglycemia 03/28/2017   Hyperlipidemia    Hypertension 65   Hypothyroidism    Knee pain 03/30/2012   L>R   Low back pain 03/23/2018   Melanoma of skin ([place]) 08/19/2016   PONV (postoperative nausea and vomiting)    Thyroid disease 64   Vitamin D deficiency 06/06/2012    PAST SURGICAL HISTORY: Past Surgical History:  Procedure Laterality Date   ABDOMINAL HYSTERECTOMY  1990's   total, for heavy bleeding and fibroids   BASAL CELL CARCINOMA EXCISION     BREAST LUMPECTOMY  WITH RADIOACTIVE SEED AND SENTINEL LYMPH NODE BIOPSY Right 11/07/2018   Procedure: RIGHT BREAST LUMPECTOMY WITH RADIOACTIVE SEED AND SENTINEL LYMPH NODE BIOPSY;  Surgeon: Tammy Klein, MD;  Location: [facility];  Service: General;  Laterality: Right;   BREAST SURGERY  early 70's   fibroid tumors removed- benign   CATARACT EXTRACTION Bilateral 12/09/12   COLONOSCOPY     EYE SURGERY Bilateral 2015   cataracts   MELANOMA EXCISION  2007    FAMILY HISTORY: Family History  Problem Relation Age of Onset   Hypertension Mother    Alzheimer's disease Mother    Dementia Mother        alzheimer's   Aortic aneurysm Father    Hypertension Father    COPD Father        smoker   Heart disease Father        s/p bypass, aortic aneurysm, carotid artery disease   Breast cancer Sister 3   Diabetes Son 22       type 1   Alzheimer's disease Maternal Grandmother    Prostate cancer Maternal Grandfather 60       metastatic   Stroke Paternal Grandfather    Ovarian cancer Maternal Aunt 75   Cancer Maternal Aunt        cervical or uterine   Colon cancer Paternal Uncle 31   Breast cancer Cousin 55   Breast cancer Cousin 55   Colon cancer Cousin        dx >50   Esophageal cancer Neg Hx    Pancreatic cancer Neg Hx    Rectal cancer Neg Hx    Stomach cancer Neg Hx   Her father died from COPD at age 62. Patients' mother died from alzheimer's at age 42. The patient has no brothers, 1 sister. Her sister, Tammy Grimes, had breast cancer at the age of 19.  Tammy Grimes, is employed with the [employer].One of their maternal aunts had ovarian cancer, and a second maternal aunt had either uterine or cervical cancer. Tammy Grimes had Melanoma in situ on her right calf.   GYNECOLOGIC HISTORY:  Menarche: 76 years old Age at first live birth: 76 years old GX P: 1 LMP: No LMP recorded. Patient has had a hysterectomy. 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 Her husband, Tammy Grimes, worked as a English as a second language teacher for an Retail buyer.  He has now retired.  Tammy Grimes has one son, Careers information officer, age 85, who lives in [place] and is a Advertising copywriter as well as a Art gallery manager.  The patient's granddaughter recently graduated from [employer] with the [employer] in physical therapy and already has a great job.  She is planning to marry November 2023.   ADVANCED DIRECTIVES: Her husband, Tammy Grimes is Forensic scientist (MPOA). If Tammy Grimes is unable, then her son, Truddie Crumble, would assume MPOA.    HEALTH MAINTENANCE: Social History   Tobacco Use   Smoking status: Never   Smokeless tobacco: Never  Vaping Use   Vaping Use: Never used  Substance Use Topics   Alcohol use: No   Drug use: No    Colonoscopy: yes, 06/07/2017  PAP:   Bone density: yes, 07/25/2018  Allergies  Allergen Reactions   Penicillins Rash    Current Outpatient Medications  Medication Sig Dispense Refill   amLODipine (NORVASC) 5 MG tablet TAKE 1 TABLET BY MOUTH AT  BEDTIME 90 tablet 3   aspirin 81 MG tablet Take 81 mg by mouth daily.     calcium-vitamin D 250-100 MG-UNIT per tablet Take 1 tablet by mouth daily.     cetirizine (ZYRTEC) 10 MG tablet Take 10 mg by mouth daily as needed for allergies.     levothyroxine (SYNTHROID) 88 MCG tablet TAKE 1 TABLET BY MOUTH  DAILY 100 tablet 2   meloxicam (MOBIC) 7.5 MG tablet Take 1 tablet (7.5 mg total) by mouth daily as needed for pain. 30 tablet 0   metoprolol tartrate (LOPRESSOR) 25 MG tablet TAKE ONE-HALF TABLET BY MOUTH  TWICE DAILY 90 tablet 3   Multiple Vitamins-Minerals (CENTRUM SILVER 50+WOMEN) TABS Use as directed     Omega-3 Fatty Acids (OMEGA 3 500) 500 MG CAPS Take 1 capsule by mouth daily.     omeprazole (PRILOSEC) 20 MG capsule TAKE 1 CAPSULE BY MOUTH  DAILY 90 capsule 3   rosuvastatin (CRESTOR) 5 MG tablet TAKE 1 TABLET BY MOUTH DAILY 100 tablet 2    tamoxifen (NOLVADEX) 20 MG tablet Take 1 tablet (20 mg total) by mouth daily. 90 tablet 3   TURMERIC CURCUMIN PO Take 1,650 mg by mouth daily.     No current facility-administered medications for this visit.    OBJECTIVE: white woman in no acute distress  Vitals:   10/08/22 1103  BP: 124/65  Pulse: 64  Resp: 16  Temp: 98.1 F (36.7 C)  SpO2: 95%      Body mass index is 23.85 kg/m.   Wt Readings from Last 3 Encounters:  10/08/22 126 lb 3.2 oz (57.2 kg)  09/20/22 129 lb (58.5 kg)  08/26/22 128 lb (58.1 kg)      ECOG FS:1 - Symptomatic but completely ambulatory  Sclerae unicteric, EOMs intact Wearing a mask No cervical or supraclavicular adenopathy Lungs no rales or rhonchi Heart regular rate and rhythm Abd soft, nontender, positive bowel sounds MSK no focal spinal tenderness, no upper extremity lymphedema Neuro: nonfocal, well oriented, appropriate affect Breasts: The right breast has undergone lumpectomy and radiation.  There is no finding suspicious for disease recurrence.  Left breast and both axillae are benign.   LAB RESULTS:  CMP     Component Value Date/Time   NA 141 10/08/2022 1041   K 3.9 10/08/2022 1041   CL 108 10/08/2022 1041   CO2 25 10/08/2022 1041   GLUCOSE 153 (H) 10/08/2022 1041   BUN 17 10/08/2022 1041   CREATININE 0.69 10/08/2022 1041   CREATININE 0.63 07/22/2020 1008   CALCIUM 9.8 10/08/2022 1041   PROT 6.9 10/08/2022 1041   ALBUMIN 4.6 10/08/2022 1041   AST 24 10/08/2022 1041   ALT 27 10/08/2022 1041   ALKPHOS 39 10/08/2022 1041   BILITOT 0.4 10/08/2022 1041   GFRNONAA >60 10/08/2022 1041   GFRAA >60 09/18/2019 1340   GFRAA >60 05/22/2019 1213    Lab Results  Component Value Date   WBC 5.8 10/08/2022   NEUTROABS 3.2 10/08/2022   HGB 12.5 10/08/2022   HCT 37.5 10/08/2022   MCV 97.2 10/08/2022   PLT 158 10/08/2022   No results found for: "LABCA2"  No components found for: "OMAYOK599"  No results for input(s): "INR" in the  last 168 hours.  No results found for: "LABCA2"  No results found for: "CAN199"  No results found for: "CAN125"  No results found for: "CAN153"  No results found for: "CA2729"  No components found for: "HGQUANT"  No results found for: "CEA1", "CEA" / No results found for: "CEA1", "CEA"   No results found for: "AFPTUMOR"  No results found for: "CHROMOGRNA"  No results found for: "TOTALPROTELP", "ALBUMINELP", "A1GS", "A2GS", "BETS", "BETA2SER", "GAMS", "MSPIKE", "SPEI" (this displays SPEP labs)  No results found for: "KPAFRELGTCHN", "LAMBDASER", "KAPLAMBRATIO" (kappa/lambda light chains)  No results found for: "HGBA", "HGBA2QUANT", "HGBFQUANT", "HGBSQUAN" (Hemoglobinopathy evaluation)   No results found for: "LDH"  No results found for: "IRON", "TIBC", "IRONPCTSAT" (Iron and TIBC)  No results found for: "FERRITIN"  Urinalysis No results found for: "COLORURINE", "APPEARANCEUR", "LABSPEC", "PHURINE", "GLUCOSEU", "HGBUR", "BILIRUBINUR", "KETONESUR", "PROTEINUR", "UROBILINOGEN", "NITRITE", "LEUKOCYTESUR"   STUDIES:  No results found.  Personal History Of Breast Cancer Personal history of treated right breast carcinoma, status post right lumpectomy 2019 Lower Inner Quadrant.  Digital breast tomosynthesis imaging has been obtained as part of this examination. Comparison is made to exam(s) dated: 09/13/2019, 09/15/2020. Breast Composition Category C: The breast tissue is heterogeneously dense, which may obscure small masses.  Current study was also evaluated with a Computer Aided Detection (CAD) system. Post surgical distortion and density at the lumpectomy site in the right upper outer breast are unchanged. Benign post operative findings are seen in the left breast. No significant masses, calcifications, or other findings are seen in either breast.  IMPRESSION: BENIGN Stable right lumpectomy. There is no mammographic evidence of malignancy. Routine mammographic  evaluation in 1 year is recommended.(09/11/2022)  This exam was interpreted at the [facility] location.  Robbi Garter M.D. sl/penrad:09/10/2021 10:43:17    ELIGIBLE FOR AVAILABLE RESEARCH PROTOCOL: no   ASSESSMENT: 76 y.o. [place] woman status post right breast lower inner quadrant biopsy 09/27/2018 for a clinical T1c N0 invasive ductal carcinoma, grade 3, estrogen and progesterone receptor positive, HER-2 not amplified, with an MIB-1 of 15%  (1) Genetic testing performed through [facility] Common Hereditary Cancers panel + Melanoma panel on 10/11/2018 showed no pathogenic mutations in APC, ATM, AXIN2, BAP1, BARD1, BMPR1A, BRCA1, BRCA2, BRIP1, BUB1B, CDH1, CDK4, CDKN2A, CHEK2, CTNNA1, DICER1, ENG, EPCAM, GALNT12, GREM1, HOXB13, KIT, MEN1, MITF, MLH1, MLH3, MSH2, MSH3, MSH6, MUTYH, NBN, NF1, NTHL1, PALB2, PDGFRA, PMS2, POLD1, POLE, POT1 PTEN, RAD50, RAD51C, RAD51D, RB1, RNF43, RPS20, SDHA, SDHB, SDHC, SDHD, SMAD4, SMARCA4, STK11, TP53, TSC1, TSC2, VHL.  (2) status post right lumpectomy and sentinel lymph node sampling 11/07/2018 for a pT2 pN0, stage IIA, grade 3 with a positive anterior margin  (a) a total of 7 lymph nodes were removed  (3) The Oncotype DX score was 10 predicting a risk of outside the breast recurrence over the next 9 years of 3% if the patient's only systemic therapy is tamoxifen for 5 years. It also predicts <1% benefit from chemotherapy.  (4) adjuvant radiation 12/14/2018 - 01/11/2019  (a) Right breast / 42.56 Gy in 16 fractions  (b) Seroma boost / 8 Gy in 4 fractions  (5) started tamoxifen 02/07/2019  (a) DEXA scan at [facility] 07/25/2018 showed a T score of -2.0  (b) repeat DEXA scan at [facility] 09/15/2020 shows a T score of -1.8   PLAN:  Magrinat, Virgie Dad, MD  10/08/22 11:25 AM Medical Oncology and Hematology [facility] [place], [place] [address] Tel. [phone]    Fax. [phone]    *Total Encounter Time as  defined by the Centers  for Medicare and Medicaid Services includes, in addition to the face-to-face time of a patient visit (documented in the note above) non-face-to-face time: obtaining and reviewing outside history, ordering and reviewing medications, tests or procedures, Tammy coordination (communications with other health Tammy professionals or caregivers) and documentation in the medical record.

## 2022-10-13 DIAGNOSIS — H26491 Other secondary cataract, right eye: Secondary | ICD-10-CM | POA: Diagnosis not present

## 2022-10-13 DIAGNOSIS — H2640 Unspecified secondary cataract: Secondary | ICD-10-CM | POA: Diagnosis not present

## 2022-10-18 ENCOUNTER — Ambulatory Visit: Payer: Medicare Other | Admitting: Sports Medicine

## 2022-10-26 ENCOUNTER — Other Ambulatory Visit: Payer: Self-pay

## 2022-10-26 DIAGNOSIS — R350 Frequency of micturition: Secondary | ICD-10-CM

## 2022-10-26 DIAGNOSIS — Z111 Encounter for screening for respiratory tuberculosis: Secondary | ICD-10-CM

## 2022-10-27 DIAGNOSIS — H26492 Other secondary cataract, left eye: Secondary | ICD-10-CM | POA: Diagnosis not present

## 2022-10-28 ENCOUNTER — Other Ambulatory Visit (INDEPENDENT_AMBULATORY_CARE_PROVIDER_SITE_OTHER): Payer: Medicare Other

## 2022-10-28 DIAGNOSIS — R350 Frequency of micturition: Secondary | ICD-10-CM

## 2022-10-28 DIAGNOSIS — R0602 Shortness of breath: Secondary | ICD-10-CM | POA: Diagnosis not present

## 2022-10-28 DIAGNOSIS — Z111 Encounter for screening for respiratory tuberculosis: Secondary | ICD-10-CM

## 2022-10-29 ENCOUNTER — Other Ambulatory Visit: Payer: Medicare Other

## 2022-10-29 DIAGNOSIS — M545 Low back pain, unspecified: Secondary | ICD-10-CM | POA: Diagnosis not present

## 2022-10-29 DIAGNOSIS — R8281 Pyuria: Secondary | ICD-10-CM

## 2022-10-29 DIAGNOSIS — C9510 Chronic leukemia of unspecified cell type not having achieved remission: Secondary | ICD-10-CM

## 2022-10-29 LAB — URINALYSIS, ROUTINE W REFLEX MICROSCOPIC
Bilirubin Urine: NEGATIVE
Hgb urine dipstick: NEGATIVE
Nitrite: NEGATIVE
Specific Gravity, Urine: 1.025 (ref 1.000–1.030)
Total Protein, Urine: NEGATIVE
Urine Glucose: NEGATIVE
Urobilinogen, UA: 0.2 (ref 0.0–1.0)
pH: 5.5 (ref 5.0–8.0)

## 2022-10-30 LAB — QUANTIFERON-TB GOLD PLUS
Mitogen-NIL: >10 IU/mL
NIL: 0.03 IU/mL
QuantiFERON-TB Gold Plus: NEGATIVE
TB1-NIL: 0 IU/mL
TB2-NIL: 0.01 IU/mL

## 2022-11-02 ENCOUNTER — Other Ambulatory Visit: Payer: Self-pay

## 2022-11-02 LAB — URINE CULTURE
MICRO NUMBER:: 14350865
SPECIMEN QUALITY:: ADEQUATE

## 2022-11-02 MED ORDER — SULFAMETHOXAZOLE-TRIMETHOPRIM 800-160 MG PO TABS: 1.0000 | ORAL_TABLET | Freq: Two times a day (BID) | ORAL | 0 refills | Status: AC

## 2022-11-03 ENCOUNTER — Other Ambulatory Visit: Payer: Self-pay | Admitting: Family Medicine

## 2022-11-19 ENCOUNTER — Other Ambulatory Visit: Payer: Self-pay | Admitting: Family Medicine

## 2022-11-24 ENCOUNTER — Telehealth: Payer: Self-pay | Admitting: Family Medicine

## 2022-11-24 NOTE — Telephone Encounter (Signed)
Spoke with patient req CB 01/2023 due to she is moving

## 2022-12-03 ENCOUNTER — Other Ambulatory Visit: Payer: Self-pay | Admitting: Hematology and Oncology

## 2023-01-19 ENCOUNTER — Telehealth: Payer: Self-pay | Admitting: Family Medicine

## 2023-01-19 NOTE — Telephone Encounter (Signed)
Copied from [place] [id number]. Topic: Medicare AWV >> Jan 19, 2023 11:34 AM Devoria Glassing wrote: Reason for CRM: Called patient to schedule Medicare Annual Wellness Visit (AWV). Left message for patient to call back and schedule Medicare Annual Wellness Visit (AWV).  Last date of AWV: 11/17/2021   Please schedule an appointment at any time with Beatris Ship, CMA   If any questions, please contact me.  Thank you ,  Sherol Dade; [place] Direct Dial: [phone]

## 2023-02-23 ENCOUNTER — Other Ambulatory Visit: Payer: Self-pay | Admitting: Family Medicine

## 2023-03-02 NOTE — Assessment & Plan Note (Signed)
On Levothyroxine, continue to monitor 

## 2023-03-02 NOTE — Assessment & Plan Note (Signed)
Well controlled, no changes to meds. Encouraged heart healthy diet such as the DASH diet and exercise as tolerated.  °

## 2023-03-02 NOTE — Assessment & Plan Note (Signed)
Encourage heart healthy diet such as MIND or DASH diet, increase exercise, avoid trans fats, simple carbohydrates and processed foods, consider a krill or fish or flaxseed oil cap daily.  °

## 2023-03-02 NOTE — Assessment & Plan Note (Signed)
Patient encouraged to maintain heart healthy diet, regular exercise, adequate sleep. Consider daily probiotics. Take medications as prescribed. Labs ordered and reviewed. Given and reviewed copy of ACP documents from [employer] and encouraged to complete and return. Aged out of PAP. MGM 09/2022 repeat in 1 year. Dexa 09/2022. Colonoscopy has aged out

## 2023-03-02 NOTE — Assessment & Plan Note (Signed)
hgba1c acceptable, minimize simple carbs. Increase exercise as tolerated.  

## 2023-03-03 ENCOUNTER — Ambulatory Visit (INDEPENDENT_AMBULATORY_CARE_PROVIDER_SITE_OTHER): Payer: Medicare Other | Admitting: Family Medicine

## 2023-03-03 VITALS — BP 126/70 | HR 67 | Temp 97.7°F | Resp 16 | Ht 61.0 in | Wt 132.2 lb

## 2023-03-03 DIAGNOSIS — E039 Hypothyroidism, unspecified: Secondary | ICD-10-CM | POA: Diagnosis not present

## 2023-03-03 DIAGNOSIS — N390 Urinary tract infection, site not specified: Secondary | ICD-10-CM | POA: Diagnosis not present

## 2023-03-03 DIAGNOSIS — Z0000 Encounter for general adult medical examination without abnormal findings: Secondary | ICD-10-CM | POA: Diagnosis not present

## 2023-03-03 DIAGNOSIS — R739 Hyperglycemia, unspecified: Secondary | ICD-10-CM

## 2023-03-03 DIAGNOSIS — M25561 Pain in right knee: Secondary | ICD-10-CM | POA: Diagnosis not present

## 2023-03-03 DIAGNOSIS — M25562 Pain in left knee: Secondary | ICD-10-CM | POA: Diagnosis not present

## 2023-03-03 DIAGNOSIS — E782 Mixed hyperlipidemia: Secondary | ICD-10-CM | POA: Diagnosis not present

## 2023-03-03 DIAGNOSIS — I10 Essential (primary) hypertension: Secondary | ICD-10-CM

## 2023-03-03 LAB — COMPREHENSIVE METABOLIC PANEL
ALT: 43 U/L — ABNORMAL HIGH (ref 0–35)
AST: 33 U/L (ref 0–37)
Albumin: 4.3 g/dL (ref 3.5–5.2)
Alkaline Phosphatase: 49 U/L (ref 39–117)
BUN: 17 mg/dL (ref 6–23)
CO2: 28 mEq/L (ref 19–32)
Calcium: 9.1 mg/dL (ref 8.4–10.5)
Chloride: 106 mEq/L (ref 96–112)
Creatinine, Ser: 0.64 mg/dL (ref 0.40–1.20)
GFR: 86.54 mL/min (ref >60.00)
Glucose, Bld: 86 mg/dL (ref 70–99)
Potassium: 4.5 mEq/L (ref 3.5–5.1)
Sodium: 143 mEq/L (ref 135–145)
Total Bilirubin: 0.4 mg/dL (ref 0.2–1.2)
Total Protein: 6.5 g/dL (ref 6.0–8.3)

## 2023-03-03 LAB — URINALYSIS, ROUTINE W REFLEX MICROSCOPIC
Bilirubin Urine: NEGATIVE
Hgb urine dipstick: NEGATIVE
Ketones, ur: NEGATIVE
Nitrite: POSITIVE — AB
Specific Gravity, Urine: 1.025 (ref 1.000–1.030)
Total Protein, Urine: NEGATIVE
Urine Glucose: NEGATIVE
Urobilinogen, UA: 0.2 (ref 0.0–1.0)
pH: 6 (ref 5.0–8.0)

## 2023-03-03 LAB — CBC WITH DIFFERENTIAL/PLATELET
Basophils Absolute: 0.1 10*3/uL (ref 0.0–0.1)
Basophils Relative: 0.8 % (ref 0.0–3.0)
Eosinophils Absolute: 0.2 10*3/uL (ref 0.0–0.7)
Eosinophils Relative: 3.4 % (ref 0.0–5.0)
HCT: 36.5 % (ref 36.0–46.0)
Hemoglobin: 12.1 g/dL (ref 12.0–15.0)
Lymphocytes Relative: 25.1 % (ref 12.0–46.0)
Lymphs Abs: 1.6 10*3/uL (ref 0.7–4.0)
MCHC: 33 g/dL (ref 30.0–36.0)
MCV: 95.2 fl (ref 78.0–100.0)
Monocytes Absolute: 0.6 10*3/uL (ref 0.1–1.0)
Monocytes Relative: 8.8 % (ref 3.0–12.0)
Neutro Abs: 4 10*3/uL (ref 1.4–7.7)
Neutrophils Relative %: 61.9 % (ref 43.0–77.0)
Platelets: 187 10*3/uL (ref 150.0–400.0)
RBC: 3.84 Mil/uL — ABNORMAL LOW (ref 3.87–5.11)
RDW: 13 % (ref 11.5–15.5)
WBC: 6.5 10*3/uL (ref 4.0–10.5)

## 2023-03-03 LAB — LIPID PANEL
Cholesterol: 119 mg/dL (ref 0–200)
HDL: 46.4 mg/dL (ref >39.00)
LDL Cholesterol: 45 mg/dL (ref 0–99)
NonHDL: 72.89
Total CHOL/HDL Ratio: 3
Triglycerides: 139 mg/dL (ref 0.0–149.0)
VLDL: 27.8 mg/dL (ref 0.0–40.0)

## 2023-03-03 LAB — TSH: TSH: 2.15 u[IU]/mL (ref 0.35–5.50)

## 2023-03-03 LAB — HEMOGLOBIN A1C: Hgb A1c MFr Bld: 5.9 % (ref 4.6–6.5)

## 2023-03-03 MED ORDER — MELOXICAM 7.5 MG PO TABS: 7.5000 mg | ORAL_TABLET | Freq: Every day | ORAL | 0 refills | Status: DC | PRN

## 2023-03-03 NOTE — Patient Instructions (Addendum)
Tylenol/Acetaminophen ES 500 mg tabs, 1-2 tabs up to 3 x a day (max of 6 tabs or 3000 mg daily)  RSV, Respiratory Syncitial Virus vaccine, Arexvy vaccine at pharmacy  Tetanus if injured   Arthritis Arthritis is a term that is commonly used to refer to joint pain or joint disease. There are more than 100 types of arthritis. What are the causes? The most common cause of this condition is wear and tear of a joint. Other causes include: Gout. Inflammation of a joint. An infection of a joint. Sprains and other injuries near the joint. A reaction to medicines or drugs, or an allergic reaction. In some cases, the cause may not be known. What are the signs or symptoms? The main symptom of this condition is pain in the joint during movement. Other symptoms include: Redness, swelling, or stiffness at a joint. Warmth coming from the joint. Fever. Overall feeling of illness. How is this diagnosed? This condition may be diagnosed with a physical exam and tests, including: Blood tests. Urine tests. Imaging tests, such as X-rays, an MRI, or a CT scan. Sometimes, fluid is removed from a joint for testing. How is this treated? This condition may be treated with: Treatment of the cause, if it is known. Rest. Raising (elevating) the joint. Applying cold or hot packs to the joint. Medicines to improve symptoms and reduce inflammation. Injections of a steroid, such as cortisone, into the joint to help reduce pain and inflammation. Depending on the cause of your arthritis, you may need to make lifestyle changes to reduce stress on your joint. Changes may include: Exercising more. Losing weight. Follow these instructions at home: Medicines Take over-the-counter and prescription medicines only as told by your health care provider. Do not take aspirin to relieve pain if your health care provider thinks that gout may be causing your pain. Activity Rest your joint if told by your health care  provider. Rest is important when your disease is active and your joint feels painful, swollen, or stiff. Avoid activities that make the pain worse. Balance activity with rest. Exercise your joint regularly with range-of-motion exercises as told by your health care provider. Try doing low-impact exercise, such as: Swimming. Water aerobics. Biking. Walking. Managing pain, stiffness, and swelling     If directed, put ice on the affected joint. To do this: Put ice in a plastic bag. Place a towel between your skin and the bag. Leave the ice on for 20 minutes, 2-3 times a day. Remove the ice if your skin turns bright red. This is very important. If you cannot feel pain, heat, or cold, you have a greater risk of damage to the area. If your joint is swollen, raise (elevate) it above the level of your heart if directed by your health care provider. If your joint feels stiff in the morning, try taking a warm shower. If directed, apply heat to the affected area as often as told by your health care provider. Use the heat source that your health care provider recommends, such as a moist heat pack or a heating pad. To apply heat: Place a towel between your skin and the heat source. Leave the heat on for 20-30 minutes. Remove the heat if your skin turns bright red. This is especially important if you are unable to feel pain, heat, or cold. You have a greater risk of getting burned. General instructions Maintain a healthy weight. Follow instructions from your health care provider for weight control. Do not use  any products that contain nicotine or tobacco. These products include cigarettes, chewing tobacco, and vaping devices, such as e-cigarettes. If you need help quitting, ask your health care provider. Keep all follow-up visits. This is important. Where to find more information [employer]: www.niams.http://www.myers.net/ Contact a health care provider if: The pain gets worse. You have a  fever. Get help right away if: You develop severe joint pain, swelling, or redness. Many joints become painful and swollen. You develop severe back pain. You develop severe weakness in your leg. Summary Arthritis is a term that is commonly used to refer to joint pain or joint disease. There are more than 100 types of arthritis. The most common cause of this condition is wear and tear of a joint. Other causes include gout, inflammation or infection of the joint, sprains, or allergies. Symptoms of this condition include redness, swelling, or stiffness of the joint. Other symptoms include warmth, fever, or feeling ill. This condition is treated with rest, elevation, medicines, and applying cold or hot packs. Follow your health care provider's instructions about medicines, activity, exercises, and other home care treatments. This information is not intended to replace advice given to you by your health care provider. Make sure you discuss any questions you have with your health care provider. Document Revised: 08/04/2021 Document Reviewed: 08/04/2021 Elsevier Patient Education  2023 [employer].

## 2023-03-03 NOTE — Progress Notes (Signed)
Subjective:    Patient ID: Tammy Grimes, female    DOB: 1946-06-21, 77 y.o.   MRN: [id number]  Chief Complaint  Patient presents with   Annual Exam    Annual Exam    HPI Patient is in today for follow up on chronic medical concerns and Annual Preventative Exam. No recent febrile illness or hospitalizations. Denies CP/palp/SOB/HA/congestion/fevers/GI or GU c/o. Taking meds as prescribed. She tries to maintain a heart healthy diet and to stay active. She hydrates well most days and tries to get adequate sleep. Pain does limit her mobility some but she uses Meloxicam sparingly for very bad days and that is helpful  Past Medical History:  Diagnosis Date   Allergy    Anemia    prior to hysterectomy   BCC (basal cell carcinoma) 08/01/2015   Breast cancer (HCC) 09/25/2018   right   Cancer (HCC) 2007   calf of right leg- melanoma   Cataract    Cervical cancer screening 06/06/2012   DDD (degenerative disc disease), cervical 08/01/2015   Family history of bladder cancer    Family history of breast cancer    Family history of colon cancer    Family history of ovarian cancer    Family history of prostate cancer    GERD (gastroesophageal reflux disease)    Hyperglycemia 03/28/2017   Hyperlipidemia    Hypertension 65   Hypothyroidism    Knee pain 03/30/2012   L>R   Low back pain 03/23/2018   Melanoma of skin (HCC) 08/19/2016   PONV (postoperative nausea and vomiting)    Thyroid disease 64   Vitamin D deficiency 06/06/2012    Past Surgical History:  Procedure Laterality Date   ABDOMINAL HYSTERECTOMY  1990's   total, for heavy bleeding and fibroids   BASAL CELL CARCINOMA EXCISION     BREAST LUMPECTOMY WITH RADIOACTIVE SEED AND SENTINEL LYMPH NODE BIOPSY Right 11/07/2018   Procedure: RIGHT BREAST LUMPECTOMY WITH RADIOACTIVE SEED AND SENTINEL LYMPH NODE BIOPSY;  Surgeon: Almond Lint, MD;  Location:  [facility];  Service: General;  Laterality: Right;   BREAST SURGERY   early 70's   fibroid tumors removed- benign   CATARACT EXTRACTION Bilateral 12/09/12   COLONOSCOPY     EYE SURGERY Bilateral 2015   cataracts   MELANOMA EXCISION  2007    Family History  Problem Relation Age of Onset   Hypertension Mother    Alzheimer's disease Mother    Dementia Mother        alzheimer's   Aortic aneurysm Father    Hypertension Father    COPD Father        smoker   Heart disease Father        s/p bypass, aortic aneurysm, carotid artery disease   Breast cancer Sister 57   Diabetes Son 22       type 1   Alzheimer's disease Maternal Grandmother    Prostate cancer Maternal Grandfather 60       metastatic   Stroke Paternal Grandfather    Ovarian cancer Maternal Aunt 54   Cancer Maternal Aunt        cervical or uterine   Colon cancer Paternal Uncle 61   Breast cancer Cousin 55   Breast cancer Cousin 55   Colon cancer Cousin        dx >50   Esophageal cancer Neg Hx    Pancreatic cancer Neg Hx    Rectal cancer Neg Hx  Stomach cancer Neg Hx     Social History   Socioeconomic History   Marital status: Married    Spouse name: Not on file   Number of children: Not on file   Years of education: Not on file   Highest education level: Not on file  Occupational History   Not on file  Tobacco Use   Smoking status: Never   Smokeless tobacco: Never  Vaping Use   Vaping Use: Never used  Substance and Sexual Activity   Alcohol use: No   Drug use: No   Sexual activity: Yes    Comment: lives with husband, retired from Banker resources work, no dietary restrictions  Other Topics Concern   Not on file  Social History Narrative   Not on file   Social Determinants of Health   Financial Resource Strain: Low Risk  (11/17/2021)   Overall Financial Resource Strain (CARDIA)    Difficulty of Paying Living Expenses: Not hard at all  Food Insecurity: No Food Insecurity (11/17/2021)   Hunger Vital Sign    Worried About Running Out of Food in the Last Year: Never  true    Ran Out of Food in the Last Year: Never true  Transportation Needs: No Transportation Needs (11/17/2021)   PRAPARE - Administrator, Civil Service (Medical): No    Lack of Transportation (Non-Medical): No  Physical Activity: Sufficiently Active (11/17/2021)   Exercise Vital Sign    Days of Exercise per Week: 6 days    Minutes of Exercise per Session: 30 min  Stress: No Stress Concern Present (11/17/2021)   [employer] of Occupational Health - Occupational Stress Questionnaire    Feeling of Stress : Not at all  Social Connections: Moderately Integrated (11/17/2021)   Social Connection and Isolation Panel [NHANES]    Frequency of Communication with Friends and Family: More than three times a week    Frequency of Social Gatherings with Friends and Family: More than three times a week    Attends Religious Services: More than 4 times per year    Active Member of [employer] or Organizations: No    Attends Banker Meetings: Never    Marital Status: Married  Catering manager Violence: Not At Risk (11/17/2021)   Humiliation, Afraid, Rape, and Kick questionnaire    Fear of Current or Ex-Partner: No    Emotionally Abused: No    Physically Abused: No    Sexually Abused: No    Outpatient Medications Prior to Visit  Medication Sig Dispense Refill   amLODipine (NORVASC) 5 MG tablet TAKE 1 TABLET BY MOUTH AT  BEDTIME 100 tablet 2   aspirin 81 MG tablet Take 81 mg by mouth daily.     calcium-vitamin D 250-100 MG-UNIT per tablet Take 1 tablet by mouth daily.     cetirizine (ZYRTEC) 10 MG tablet Take 10 mg by mouth daily as needed for allergies.     levothyroxine (SYNTHROID) 88 MCG tablet TAKE 1 TABLET BY MOUTH DAILY 100 tablet 2   metoprolol tartrate (LOPRESSOR) 25 MG tablet TAKE ONE-HALF TABLET BY MOUTH  TWICE DAILY 100 tablet 2   Multiple Vitamins-Minerals (CENTRUM SILVER 50+WOMEN) TABS Use as directed     Omega-3 Fatty Acids (OMEGA 3 500) 500 MG CAPS Take 1 capsule  by mouth daily.     omeprazole (PRILOSEC) 20 MG capsule TAKE 1 CAPSULE BY MOUTH DAILY 100 capsule 2   rosuvastatin (CRESTOR) 5 MG tablet TAKE 1 TABLET BY MOUTH DAILY  100 tablet 2   tamoxifen (NOLVADEX) 20 MG tablet TAKE 1 TABLET BY MOUTH DAILY 90 tablet 4   TURMERIC CURCUMIN PO Take 1,650 mg by mouth daily.     meloxicam (MOBIC) 7.5 MG tablet Take 1 tablet (7.5 mg total) by mouth daily as needed for pain. 30 tablet 0   No facility-administered medications prior to visit.    Allergies  Allergen Reactions   Penicillins Rash    Review of Systems  Constitutional:  Negative for chills, fever and malaise/fatigue.  HENT:  Negative for congestion and hearing loss.   Eyes:  Negative for discharge.  Respiratory:  Negative for cough, sputum production and shortness of breath.   Cardiovascular:  Negative for chest pain, palpitations and leg swelling.  Gastrointestinal:  Negative for abdominal pain, blood in stool, constipation, diarrhea, heartburn, nausea and vomiting.  Genitourinary:  Positive for urgency. Negative for dysuria, frequency and hematuria.  Musculoskeletal:  Positive for back pain and joint pain. Negative for falls and myalgias.  Skin:  Negative for rash.  Neurological:  Negative for dizziness, sensory change, loss of consciousness, weakness and headaches.  Endo/Heme/Allergies:  Negative for environmental allergies. Does not bruise/bleed easily.  Psychiatric/Behavioral:  Negative for depression and suicidal ideas. The patient is not nervous/anxious and does not have insomnia.        Objective:    Physical Exam Constitutional:      General: She is not in acute distress.    Appearance: Normal appearance. She is not diaphoretic.  HENT:     Head: Normocephalic and atraumatic.     Right Ear: Tympanic membrane, ear canal and external ear normal.     Left Ear: Tympanic membrane, ear canal and external ear normal.     Nose: Nose normal.     Mouth/Throat:     Mouth: Mucous  membranes are moist.     Pharynx: Oropharynx is clear. No oropharyngeal exudate.  Eyes:     General: No scleral icterus.       Right eye: No discharge.        Left eye: No discharge.     Conjunctiva/sclera: Conjunctivae normal.     Pupils: Pupils are equal, round, and reactive to light.  Neck:     Thyroid: No thyromegaly.  Cardiovascular:     Rate and Rhythm: Normal rate and regular rhythm.     Heart sounds: Normal heart sounds. No murmur heard. Pulmonary:     Effort: Pulmonary effort is normal. No respiratory distress.     Breath sounds: Normal breath sounds. No wheezing or rales.  Abdominal:     General: Bowel sounds are normal. There is no distension.     Palpations: Abdomen is soft. There is no mass.     Tenderness: There is no abdominal tenderness.  Musculoskeletal:        General: No tenderness. Normal range of motion.     Cervical back: Normal range of motion and neck supple.  Lymphadenopathy:     Cervical: No cervical adenopathy.  Skin:    General: Skin is warm and dry.     Findings: No rash.  Neurological:     General: No focal deficit present.     Mental Status: She is alert and oriented to person, place, and time.     Cranial Nerves: No cranial nerve deficit.     Coordination: Coordination normal.     Deep Tendon Reflexes: Reflexes are normal and symmetric. Reflexes normal.  Psychiatric:  Mood and Affect: Mood normal.        Behavior: Behavior normal.        Thought Content: Thought content normal.        Judgment: Judgment normal.     BP 126/70 (BP Location: Right Arm, Patient Position: Sitting, Cuff Size: Normal)   Pulse 67   Temp 97.7 F (36.5 C) (Oral)   Resp 16   Ht 5\' 1"  (1.549 m)   Wt 132 lb 3.2 oz (60 kg)   SpO2 97%   BMI 24.98 kg/m  Wt Readings from Last 3 Encounters:  03/03/23 132 lb 3.2 oz (60 kg)  10/08/22 126 lb 3.2 oz (57.2 kg)  09/20/22 129 lb (58.5 kg)    Diabetic Foot Exam - Simple   No data filed    Lab Results   Component Value Date   WBC 6.5 03/03/2023   HGB 12.1 03/03/2023   HCT 36.5 03/03/2023   PLT 187.0 03/03/2023   GLUCOSE 86 03/03/2023   CHOL 119 03/03/2023   TRIG 139.0 03/03/2023   HDL 46.40 03/03/2023   LDLDIRECT 152.0 08/01/2015   LDLCALC 45 03/03/2023   ALT 43 (H) 03/03/2023   AST 33 03/03/2023   NA 143 03/03/2023   K 4.5 03/03/2023   CL 106 03/03/2023   CREATININE 0.64 03/03/2023   BUN 17 03/03/2023   CO2 28 03/03/2023   TSH 2.15 03/03/2023   HGBA1C 5.9 03/03/2023    Lab Results  Component Value Date   TSH 2.15 03/03/2023   Lab Results  Component Value Date   WBC 6.5 03/03/2023   HGB 12.1 03/03/2023   HCT 36.5 03/03/2023   MCV 95.2 03/03/2023   PLT 187.0 03/03/2023   Lab Results  Component Value Date   NA 143 03/03/2023   K 4.5 03/03/2023   CO2 28 03/03/2023   GLUCOSE 86 03/03/2023   BUN 17 03/03/2023   CREATININE 0.64 03/03/2023   BILITOT 0.4 03/03/2023   ALKPHOS 49 03/03/2023   AST 33 03/03/2023   ALT 43 (H) 03/03/2023   PROT 6.5 03/03/2023   ALBUMIN 4.3 03/03/2023   CALCIUM 9.1 03/03/2023   ANIONGAP 8 10/08/2022   GFR 86.54 03/03/2023   Lab Results  Component Value Date   CHOL 119 03/03/2023   Lab Results  Component Value Date   HDL 46.40 03/03/2023   Lab Results  Component Value Date   LDLCALC 45 03/03/2023   Lab Results  Component Value Date   TRIG 139.0 03/03/2023   Lab Results  Component Value Date   CHOLHDL 3 03/03/2023   Lab Results  Component Value Date   HGBA1C 5.9 03/03/2023       Assessment & Plan:  Essential hypertension Assessment & Plan: Well controlled, no changes to meds. Encouraged heart healthy diet such as the DASH diet and exercise as tolerated.   Orders: -     CBC with Differential/Platelet -     Comprehensive metabolic panel -     TSH  Hyperglycemia Assessment & Plan: hgba1c acceptable, minimize simple carbs. Increase exercise as tolerated.   Orders: -     Hemoglobin A1c  Mixed  hyperlipidemia Assessment & Plan: Encourage heart healthy diet such as MIND or DASH diet, increase exercise, avoid trans fats, simple carbohydrates and processed foods, consider a krill or fish or flaxseed oil cap daily.   Orders: -     Lipid panel  Hypothyroidism, unspecified type Assessment & Plan: On Levothyroxine, continue to monitor  Preventative health care Assessment & Plan: Patient encouraged to maintain heart healthy diet, regular exercise, adequate sleep. Consider daily probiotics. Take medications as prescribed. Labs ordered and reviewed. Given and reviewed copy of ACP documents from [employer] and encouraged to complete and return. Aged out of PAP. MGM 09/2022 repeat in 1 year. Dexa 09/2022. Colonoscopy has aged out   Urinary tract infection without hematuria, site unspecified Assessment & Plan: Hydrate well and check UA and culture.   Orders: -     Urinalysis, Routine w reflex microscopic -     Urine Culture  Pain in both knees, unspecified chronicity Assessment & Plan: Encouraged moist heat and gentle stretching as tolerated. May try Tylenol and prescription meds as directed and report if symptoms worsen or seek immediate care    Other orders -     Meloxicam; Take 1 tablet (7.5 mg total) by mouth daily as needed for pain.  Dispense: 30 tablet; Refill: 0    Danise Edge, MD

## 2023-03-05 LAB — URINE CULTURE
MICRO NUMBER:: 14873659
SPECIMEN QUALITY:: ADEQUATE

## 2023-03-06 DIAGNOSIS — N390 Urinary tract infection, site not specified: Secondary | ICD-10-CM | POA: Insufficient documentation

## 2023-03-06 NOTE — Assessment & Plan Note (Signed)
Encouraged moist heat and gentle stretching as tolerated. May try Tylenol and prescription meds as directed and report if symptoms worsen or seek immediate care 

## 2023-03-06 NOTE — Assessment & Plan Note (Signed)
Hydrate well and check UA and culture 

## 2023-03-08 DIAGNOSIS — L603 Nail dystrophy: Secondary | ICD-10-CM | POA: Diagnosis not present

## 2023-03-08 DIAGNOSIS — D1801 Hemangioma of skin and subcutaneous tissue: Secondary | ICD-10-CM | POA: Diagnosis not present

## 2023-03-08 DIAGNOSIS — D485 Neoplasm of uncertain behavior of skin: Secondary | ICD-10-CM | POA: Diagnosis not present

## 2023-03-08 DIAGNOSIS — L814 Other melanin hyperpigmentation: Secondary | ICD-10-CM | POA: Diagnosis not present

## 2023-03-08 DIAGNOSIS — D225 Melanocytic nevi of trunk: Secondary | ICD-10-CM | POA: Diagnosis not present

## 2023-03-08 DIAGNOSIS — Z85828 Personal history of other malignant neoplasm of skin: Secondary | ICD-10-CM | POA: Diagnosis not present

## 2023-04-08 ENCOUNTER — Telehealth: Payer: Self-pay | Admitting: Family Medicine

## 2023-04-08 NOTE — Telephone Encounter (Signed)
Copied from CRM [id number]. Topic: Medicare AWV >> Apr 08, 2023  2:36 PM Payton Doughty wrote: Reason for CRM: LM 04/08/2023 to schedule AWV   Verlee Rossetti; Care Guide Ambulatory Clinical Support [place] [facility] Direct Dial: [phone]

## 2023-04-12 ENCOUNTER — Other Ambulatory Visit: Payer: Self-pay | Admitting: Family Medicine

## 2023-05-30 ENCOUNTER — Telehealth: Payer: Self-pay | Admitting: Family Medicine

## 2023-05-30 NOTE — Telephone Encounter (Signed)
Copied from CRM [id number]. Topic: Medicare AWV >> May 30, 2023  2:17 PM Payton Doughty wrote: Reason for CRM: LM 05/30/2023 to schedule AWV   Verlee Rossetti; Care Guide Ambulatory Clinical Support [place] [facility] Direct Dial: [phone]

## 2023-06-21 ENCOUNTER — Other Ambulatory Visit: Payer: Self-pay | Admitting: Family Medicine

## 2023-07-16 ENCOUNTER — Other Ambulatory Visit: Payer: Self-pay | Admitting: Family Medicine

## 2023-08-03 ENCOUNTER — Other Ambulatory Visit: Payer: Self-pay | Admitting: Family Medicine

## 2023-09-02 ENCOUNTER — Telehealth: Payer: Self-pay | Admitting: Family Medicine

## 2023-09-02 NOTE — Telephone Encounter (Signed)
[facility] called to get approval for a manufacturer change on pt's Levothroxine. Manufacturer is changing from Amneal to Lupin.  Ref #: [id number] P: [id number]

## 2023-09-02 NOTE — Telephone Encounter (Signed)
Called patient and left voicemail.

## 2023-09-05 NOTE — Telephone Encounter (Signed)
Pharmacy notified ok to change.  Pt has appointment on 09/08/23

## 2023-09-07 NOTE — Assessment & Plan Note (Signed)
On Levothyroxine, continue to monitor 

## 2023-09-07 NOTE — Assessment & Plan Note (Addendum)
Encourage heart healthy diet such as MIND or DASH diet, increase exercise, avoid trans fats, simple carbohydrates and processed foods, consider a krill or fish or flaxseed oil cap daily.  Tolerating Rosuvastatin 

## 2023-09-07 NOTE — Assessment & Plan Note (Signed)
Well controlled, no changes to meds. Encouraged heart healthy diet such as the DASH diet and exercise as tolerated.  °

## 2023-09-07 NOTE — Assessment & Plan Note (Signed)
hgba1c acceptable, minimize simple carbs. Increase exercise as tolerated.  

## 2023-09-07 NOTE — Assessment & Plan Note (Signed)
Encouraged to get adequate exercise, calcium and vitamin d intake 

## 2023-09-07 NOTE — Assessment & Plan Note (Signed)
On Tamoxifen

## 2023-09-08 ENCOUNTER — Encounter: Payer: Self-pay | Admitting: Family Medicine

## 2023-09-08 ENCOUNTER — Ambulatory Visit (INDEPENDENT_AMBULATORY_CARE_PROVIDER_SITE_OTHER): Payer: Medicare Other | Admitting: Family Medicine

## 2023-09-08 VITALS — BP 162/90 | HR 90 | Temp 98.0°F | Resp 18 | Ht 61.0 in | Wt 136.2 lb

## 2023-09-08 DIAGNOSIS — M8580 Other specified disorders of bone density and structure, unspecified site: Secondary | ICD-10-CM | POA: Diagnosis not present

## 2023-09-08 DIAGNOSIS — R2681 Unsteadiness on feet: Secondary | ICD-10-CM

## 2023-09-08 DIAGNOSIS — C50511 Malignant neoplasm of lower-outer quadrant of right female breast: Secondary | ICD-10-CM | POA: Diagnosis not present

## 2023-09-08 DIAGNOSIS — E039 Hypothyroidism, unspecified: Secondary | ICD-10-CM | POA: Diagnosis not present

## 2023-09-08 DIAGNOSIS — E782 Mixed hyperlipidemia: Secondary | ICD-10-CM

## 2023-09-08 DIAGNOSIS — Z170 Estrogen receptor positive status [ER+]: Secondary | ICD-10-CM

## 2023-09-08 DIAGNOSIS — R739 Hyperglycemia, unspecified: Secondary | ICD-10-CM | POA: Diagnosis not present

## 2023-09-08 DIAGNOSIS — I10 Essential (primary) hypertension: Secondary | ICD-10-CM

## 2023-09-08 DIAGNOSIS — Z23 Encounter for immunization: Secondary | ICD-10-CM | POA: Diagnosis not present

## 2023-09-08 LAB — COMPREHENSIVE METABOLIC PANEL
ALT: 58 U/L — ABNORMAL HIGH (ref 0–35)
AST: 51 U/L — ABNORMAL HIGH (ref 0–37)
Albumin: 4.7 g/dL (ref 3.5–5.2)
Alkaline Phosphatase: 43 U/L (ref 39–117)
BUN: 15 mg/dL (ref 6–23)
CO2: 27 meq/L (ref 19–32)
Calcium: 9.7 mg/dL (ref 8.4–10.5)
Chloride: 105 meq/L (ref 96–112)
Creatinine, Ser: 0.67 mg/dL (ref 0.40–1.20)
GFR: 85.28 mL/min (ref >60.00)
Glucose, Bld: 107 mg/dL — ABNORMAL HIGH (ref 70–99)
Potassium: 4.6 meq/L (ref 3.5–5.1)
Sodium: 144 meq/L (ref 135–145)
Total Bilirubin: 0.5 mg/dL (ref 0.2–1.2)
Total Protein: 6.9 g/dL (ref 6.0–8.3)

## 2023-09-08 LAB — CBC WITH DIFFERENTIAL/PLATELET
Basophils Absolute: 0.1 10*3/uL (ref 0.0–0.1)
Basophils Relative: 1.3 % (ref 0.0–3.0)
Eosinophils Absolute: 0.2 10*3/uL (ref 0.0–0.7)
Eosinophils Relative: 3.3 % (ref 0.0–5.0)
HCT: 41.6 % (ref 36.0–46.0)
Hemoglobin: 13.1 g/dL (ref 12.0–15.0)
Lymphocytes Relative: 27.1 % (ref 12.0–46.0)
Lymphs Abs: 1.6 10*3/uL (ref 0.7–4.0)
MCHC: 31.5 g/dL (ref 30.0–36.0)
MCV: 96.9 fL (ref 78.0–100.0)
Monocytes Absolute: 0.5 10*3/uL (ref 0.1–1.0)
Monocytes Relative: 8.3 % (ref 3.0–12.0)
Neutro Abs: 3.5 10*3/uL (ref 1.4–7.7)
Neutrophils Relative %: 60 % (ref 43.0–77.0)
Platelets: 192 10*3/uL (ref 150.0–400.0)
RBC: 4.3 Mil/uL (ref 3.87–5.11)
RDW: 13.2 % (ref 11.5–15.5)
WBC: 5.8 10*3/uL (ref 4.0–10.5)

## 2023-09-08 LAB — LIPID PANEL
Cholesterol: 140 mg/dL (ref 0–200)
HDL: 52.1 mg/dL (ref >39.00)
LDL Cholesterol: 61 mg/dL (ref 0–99)
NonHDL: 88.01
Total CHOL/HDL Ratio: 3
Triglycerides: 134 mg/dL (ref 0.0–149.0)
VLDL: 26.8 mg/dL (ref 0.0–40.0)

## 2023-09-08 LAB — HEMOGLOBIN A1C: Hgb A1c MFr Bld: 5.9 % (ref 4.6–6.5)

## 2023-09-08 LAB — TSH: TSH: 2.51 u[IU]/mL (ref 0.35–5.50)

## 2023-09-08 MED ORDER — TIZANIDINE HCL 2 MG PO TABS: 1.0000 mg | ORAL_TABLET | Freq: Three times a day (TID) | ORAL | 2 refills | Status: DC | PRN

## 2023-09-08 MED ORDER — CELECOXIB 100 MG PO CAPS: 100.0000 mg | ORAL_CAPSULE | Freq: Two times a day (BID) | ORAL | 2 refills | Status: DC | PRN

## 2023-09-08 NOTE — Progress Notes (Signed)
Subjective:    Patient ID: Tammy Grimes, female    DOB: August 09, 1946, 77 y.o.   MRN: [id number]  Chief Complaint  Patient presents with   Follow-up    HPI Discussed the use of AI scribe software for clinical note transcription with the patient, who gave verbal consent to proceed.  History of Present Illness   The patient, with a history of hypertension, presents with increased blood pressure. They report not frequently checking their blood pressure at home. The patient mentions experiencing knee and back pain, which has been increasing in frequency. They describe having a high pain threshold and have been managing the pain with generic Tylenol. They also report wearing knee braces for stability, but mention that wearing them all day causes their ankles to swell.  The patient also reports experiencing pain in their left eye, which they describe as similar to the discomfort experienced from wearing contact lenses for too long. This pain has been present for a couple of years and has been mentioned to their previous eye doctor.  The patient also mentions being diagnosed with osteoporosis following their last bone density test. They are currently on tamoxifen due to a history of cancer and are approaching the five-year mark since their cancer diagnosis.        Past Medical History:  Diagnosis Date   Allergy    Anemia    prior to hysterectomy   BCC (basal cell carcinoma) 08/01/2015   Breast cancer (HCC) 09/25/2018   right   Cancer (HCC) 2007   calf of right leg- melanoma   Cataract    Cervical cancer screening 06/06/2012   DDD (degenerative disc disease), cervical 08/01/2015   Family history of bladder cancer    Family history of breast cancer    Family history of colon cancer    Family history of ovarian cancer    Family history of prostate cancer    GERD (gastroesophageal reflux disease)    Hyperglycemia 03/28/2017   Hyperlipidemia    Hypertension 65   Hypothyroidism    Knee  pain 03/30/2012   L>R   Low back pain 03/23/2018   Melanoma of skin (HCC) 08/19/2016   PONV (postoperative nausea and vomiting)    Thyroid disease 64   Vitamin D deficiency 06/06/2012    Past Surgical History:  Procedure Laterality Date   ABDOMINAL HYSTERECTOMY  1990's   total, for heavy bleeding and fibroids   BASAL CELL CARCINOMA EXCISION     BREAST LUMPECTOMY WITH RADIOACTIVE SEED AND SENTINEL LYMPH NODE BIOPSY Right 11/07/2018   Procedure: RIGHT BREAST LUMPECTOMY WITH RADIOACTIVE SEED AND SENTINEL LYMPH NODE BIOPSY;  Surgeon: Almond Lint, MD;  Location: [facility];  Service: General;  Laterality: Right;   BREAST SURGERY  early 70's   fibroid tumors removed- benign   CATARACT EXTRACTION Bilateral 12/09/12   COLONOSCOPY     EYE SURGERY Bilateral 2015   cataracts   MELANOMA EXCISION  2007    Family History  Problem Relation Age of Onset   Hypertension Mother    Alzheimer's disease Mother    Dementia Mother        alzheimer's   Aortic aneurysm Father    Hypertension Father    COPD Father        smoker   Heart disease Father        s/p bypass, aortic aneurysm, carotid artery disease   Breast cancer Sister 53   Diabetes Son 6  type 1   Alzheimer's disease Maternal Grandmother    Prostate cancer Maternal Grandfather 60       metastatic   Stroke Paternal Grandfather    Ovarian cancer Maternal Aunt 58   Cancer Maternal Aunt        cervical or uterine   Colon cancer Paternal Uncle 41   Breast cancer Cousin 76   Breast cancer Cousin 71   Colon cancer Cousin        dx >50   Esophageal cancer Neg Hx    Pancreatic cancer Neg Hx    Rectal cancer Neg Hx    Stomach cancer Neg Hx     Social History   Socioeconomic History   Marital status: Married    Spouse name: Not on file   Number of children: Not on file   Years of education: Not on file   Highest education level: Associate degree: occupational, Scientist, product/process development, or vocational program  Occupational  History   Not on file  Tobacco Use   Smoking status: Never   Smokeless tobacco: Never  Vaping Use   Vaping status: Never Used  Substance and Sexual Activity   Alcohol use: No   Drug use: No   Sexual activity: Yes    Comment: lives with husband, retired from Banker resources work, no dietary restrictions  Other Topics Concern   Not on file  Social History Narrative   Not on file   Social Determinants of Health   Financial Resource Strain: Low Risk  (09/06/2023)   Overall Financial Resource Strain (CARDIA)    Difficulty of Paying Living Expenses: Not hard at all  Food Insecurity: No Food Insecurity (09/06/2023)   Hunger Vital Sign    Worried About Running Out of Food in the Last Year: Never true    Ran Out of Food in the Last Year: Never true  Transportation Needs: No Transportation Needs (09/06/2023)   PRAPARE - Administrator, Civil Service (Medical): No    Lack of Transportation (Non-Medical): No  Physical Activity: Insufficiently Active (09/06/2023)   Exercise Vital Sign    Days of Exercise per Week: 2 days    Minutes of Exercise per Session: 30 min  Stress: No Stress Concern Present (09/06/2023)   [employer] of Occupational Health - Occupational Stress Questionnaire    Feeling of Stress : Only a little  Social Connections: Socially Integrated (09/06/2023)   Social Connection and Isolation Panel [NHANES]    Frequency of Communication with Friends and Family: Twice a week    Frequency of Social Gatherings with Friends and Family: More than three times a week    Attends Religious Services: More than 4 times per year    Active Member of [employer] or Organizations: Yes    Attends Engineer, structural: More than 4 times per year    Marital Status: Married  Catering manager Violence: Not At Risk (11/17/2021)   Humiliation, Afraid, Rape, and Kick questionnaire    Fear of Current or Ex-Partner: No    Emotionally Abused: No    Physically Abused: No     Sexually Abused: No    Outpatient Medications Prior to Visit  Medication Sig Dispense Refill   amLODipine (NORVASC) 5 MG tablet TAKE 1 TABLET BY MOUTH AT  BEDTIME 100 tablet 2   aspirin 81 MG tablet Take 81 mg by mouth daily.     calcium-vitamin D 250-100 MG-UNIT per tablet Take 1 tablet by mouth daily.  cetirizine (ZYRTEC) 10 MG tablet Take 10 mg by mouth daily as needed for allergies.     levothyroxine (SYNTHROID) 88 MCG tablet TAKE 1 TABLET BY MOUTH DAILY 100 tablet 2   metoprolol tartrate (LOPRESSOR) 25 MG tablet TAKE ONE-HALF TABLET BY MOUTH  TWICE DAILY 100 tablet 2   Multiple Vitamins-Minerals (CENTRUM SILVER 50+WOMEN) TABS Use as directed     Omega-3 Fatty Acids (OMEGA 3 500) 500 MG CAPS Take 1 capsule by mouth daily.     omeprazole (PRILOSEC) 20 MG capsule TAKE 1 CAPSULE BY MOUTH DAILY 100 capsule 2   rosuvastatin (CRESTOR) 5 MG tablet TAKE 1 TABLET BY MOUTH DAILY 100 tablet 2   tamoxifen (NOLVADEX) 20 MG tablet TAKE 1 TABLET BY MOUTH DAILY 90 tablet 4   TURMERIC CURCUMIN PO Take 1,650 mg by mouth daily.     meloxicam (MOBIC) 7.5 MG tablet Take 1 tablet (7.5 mg total) by mouth daily as needed for pain. 30 tablet 0   No facility-administered medications prior to visit.    Allergies  Allergen Reactions   Sodium Bisphosphate [Sodium Phosphate] Other (See Comments)    arthralgias   Penicillins Rash    Review of Systems  Constitutional:  Negative for chills, fever and malaise/fatigue.  HENT:  Negative for congestion and hearing loss.   Eyes:  Positive for pain. Negative for blurred vision, double vision, photophobia and discharge.  Respiratory:  Negative for cough, sputum production and shortness of breath.   Cardiovascular:  Negative for chest pain, palpitations and leg swelling.  Gastrointestinal:  Negative for abdominal pain, blood in stool, constipation, diarrhea, heartburn, nausea and vomiting.  Genitourinary:  Negative for dysuria, frequency, hematuria and urgency.   Musculoskeletal:  Positive for back pain, joint pain and myalgias. Negative for falls.  Skin:  Negative for rash.  Neurological:  Negative for dizziness, sensory change, loss of consciousness, weakness and headaches.  Endo/Heme/Allergies:  Negative for environmental allergies. Does not bruise/bleed easily.  Psychiatric/Behavioral:  Negative for depression and suicidal ideas. The patient is not nervous/anxious and does not have insomnia.        Objective:    Physical Exam  BP (!) 162/90 (BP Location: Left Arm, Patient Position: Sitting, Cuff Size: Normal)   Pulse 90   Temp 98 F (36.7 C) (Oral)   Resp 18   Ht 5\' 1"  (1.549 m)   Wt 136 lb 3.2 oz (61.8 kg)   SpO2 96%   BMI 25.73 kg/m  Wt Readings from Last 3 Encounters:  09/08/23 136 lb 3.2 oz (61.8 kg)  03/03/23 132 lb 3.2 oz (60 kg)  10/08/22 126 lb 3.2 oz (57.2 kg)    Diabetic Foot Exam - Simple   No data filed    Lab Results  Component Value Date   WBC 6.5 03/03/2023   HGB 12.1 03/03/2023   HCT 36.5 03/03/2023   PLT 187.0 03/03/2023   GLUCOSE 86 03/03/2023   CHOL 119 03/03/2023   TRIG 139.0 03/03/2023   HDL 46.40 03/03/2023   LDLDIRECT 152.0 08/01/2015   LDLCALC 45 03/03/2023   ALT 43 (H) 03/03/2023   AST 33 03/03/2023   NA 143 03/03/2023   K 4.5 03/03/2023   CL 106 03/03/2023   CREATININE 0.64 03/03/2023   BUN 17 03/03/2023   CO2 28 03/03/2023   TSH 2.15 03/03/2023   HGBA1C 5.9 03/03/2023    Lab Results  Component Value Date   TSH 2.15 03/03/2023   Lab Results  Component Value Date  WBC 6.5 03/03/2023   HGB 12.1 03/03/2023   HCT 36.5 03/03/2023   MCV 95.2 03/03/2023   PLT 187.0 03/03/2023   Lab Results  Component Value Date   NA 143 03/03/2023   K 4.5 03/03/2023   CO2 28 03/03/2023   GLUCOSE 86 03/03/2023   BUN 17 03/03/2023   CREATININE 0.64 03/03/2023   BILITOT 0.4 03/03/2023   ALKPHOS 49 03/03/2023   AST 33 03/03/2023   ALT 43 (H) 03/03/2023   PROT 6.5 03/03/2023   ALBUMIN 4.3  03/03/2023   CALCIUM 9.1 03/03/2023   ANIONGAP 8 10/08/2022   GFR 86.54 03/03/2023   Lab Results  Component Value Date   CHOL 119 03/03/2023   Lab Results  Component Value Date   HDL 46.40 03/03/2023   Lab Results  Component Value Date   LDLCALC 45 03/03/2023   Lab Results  Component Value Date   TRIG 139.0 03/03/2023   Lab Results  Component Value Date   CHOLHDL 3 03/03/2023   Lab Results  Component Value Date   HGBA1C 5.9 03/03/2023       Assessment & Plan:  Essential hypertension Assessment & Plan: Well controlled, no changes to meds. Encouraged heart healthy diet such as the DASH diet and exercise as tolerated.   Orders: -     Comprehensive metabolic panel -     CBC with Differential/Platelet  Hyperglycemia Assessment & Plan: hgba1c acceptable, minimize simple carbs. Increase exercise as tolerated.   Orders: -     Hemoglobin A1c  Mixed hyperlipidemia Assessment & Plan: Encourage heart healthy diet such as MIND or DASH diet, increase exercise, avoid trans fats, simple carbohydrates and processed foods, consider a krill or fish or flaxseed oil cap daily. Tolerating Rosuvastatin  Orders: -     Lipid panel  Hypothyroidism, unspecified type Assessment & Plan: On Levothyroxine, continue to monitor  Orders: -     TSH  Malignant neoplasm of lower-outer quadrant of right breast of female, estrogen receptor positive (HCC) Assessment & Plan: On Tamoxifen   Osteopenia, unspecified location Assessment & Plan: Encouraged to get adequate exercise, calcium and vitamin d intake   Unsteady gait Assessment & Plan: She is referred to [facility] at [facility] where she lives.    Need for influenza vaccination -     Flu Vaccine Trivalent High Dose (Fluad)  Other orders -     tiZANidine HCl; Take 0.5-2 tablets (1-4 mg total) by mouth every 8 (eight) hours as needed for muscle spasms.  Dispense: 40 tablet; Refill: 2 -     Celecoxib; Take 1 capsule  (100 mg total) by mouth 2 (two) times daily as needed.  Dispense: 40 capsule; Refill: 2    Assessment and Plan    Hypertension Elevated blood pressure possibly due to poor sleep and pain. -Check blood pressure at home twice weekly for the next month and report readings.  Chronic Pain (Knee and Back) Increased frequency of pain, affecting sleep and possibly contributing to hypertension. -Start physical therapy at [facility], with consideration for dry needling. -Start Tizanidine 2mg  at bedtime, with potential for dose adjustment based on response. -Continue Tylenol on a scheduled basis for better pain control.  Osteoarthritis (Knees) Pain and instability, with left knee causing more discomfort. Wearing braces intermittently, but causing ankle swelling. -Consider consultation with new sports medicine doctor for non-surgical management strategies.  Ocular Pain (Left Eye) Chronic pain described as scratchy, similar to sensation after prolonged contact lens wear. no new  visual changes or trauma -Continue with scheduled optometrist appointment.  Osteoporosis Diagnosed on recent bone density scan, currently on Tamoxifen which may affect bone health. -Consider Prolia after completion of Tamoxifen course, given intolerance to bisphosphonates.  General Health Maintenance -Administer influenza vaccine today. -Check blood sugar, thyroid, blood count, and kidney panel. -Consider COVID-19 booster shot. -Schedule physical in six months.         Danise Edge, MD

## 2023-09-08 NOTE — Patient Instructions (Addendum)
Tylenol/Acetaminophen max in 24 hours in 3000 mg   Check bp and pulse at rest at home roughly 2 x weekly and send [place] numbers in about a month or sooner ir running high or you are symptomatic  Acute Back Pain, Adult Acute back pain is sudden and usually short-lived. It is often caused by an injury to the muscles and tissues in the back. The injury may result from: A muscle, tendon, or ligament getting overstretched or torn. Ligaments are tissues that connect bones to each other. Lifting something improperly can cause a back strain. Wear and tear (degeneration) of the spinal disks. Spinal disks are circular tissue that provide cushioning between the bones of the spine (vertebrae). Twisting motions, such as while playing sports or doing yard work. A hit to the back. Arthritis. You may have a physical exam, lab tests, and imaging tests to find the cause of your pain. Acute back pain usually goes away with rest and home care. Follow these instructions at home: Managing pain, stiffness, and swelling Take over-the-counter and prescription medicines only as told by your health care provider. Treatment may include medicines for pain and inflammation that are taken by mouth or applied to the skin, or muscle relaxants. Your health care provider may recommend applying ice during the first 24-48 hours after your pain starts. To do this: Put ice in a plastic bag. Place a towel between your skin and the bag. Leave the ice on for 20 minutes, 2-3 times a day. Remove the ice if your skin turns bright red. This is very important. If you cannot feel pain, heat, or cold, you have a greater risk of damage to the area. If directed, apply heat to the affected area as often as told by your health care provider. Use the heat source that your health care provider recommends, such as a moist heat pack or a heating pad. Place a towel between your skin and the heat source. Leave the heat on for 20-30 minutes. Remove the  heat if your skin turns bright red. This is especially important if you are unable to feel pain, heat, or cold. You have a greater risk of getting burned. Activity  Do not stay in bed. Staying in bed for more than 1-2 days can delay your recovery. Sit up and stand up straight. Avoid leaning forward when you sit or hunching over when you stand. If you work at a desk, sit close to it so you do not need to lean over. Keep your chin tucked in. Keep your neck drawn back, and keep your elbows bent at a 90-degree angle (right angle). Sit high and close to the steering wheel when you drive. Add lower back (lumbar) support to your car seat, if needed. Take short walks on even surfaces as soon as you are able. Try to increase the length of time you walk each day. Do not sit, drive, or stand in one place for more than 30 minutes at a time. Sitting or standing for long periods of time can put stress on your back. Do not drive or use heavy machinery while taking prescription pain medicine. Use proper lifting techniques. When you bend and lift, use positions that put less stress on your back: [place] your knees. Keep the load close to your body. Avoid twisting. Exercise regularly as told by your health care provider. Exercising helps your back heal faster and helps prevent back injuries by keeping muscles strong and flexible. Work with a physical therapist to  make a safe exercise program, as recommended by your health care provider. Do any exercises as told by your physical therapist. Lifestyle Maintain a healthy weight. Extra weight puts stress on your back and makes it difficult to have good posture. Avoid activities or situations that make you feel anxious or stressed. Stress and anxiety increase muscle tension and can make back pain worse. Learn ways to manage anxiety and stress, such as through exercise. General instructions Sleep on a firm mattress in a comfortable position. Try lying on your side with your  knees slightly bent. If you lie on your back, put a pillow under your knees. Keep your head and neck in a straight line with your spine (neutral position) when using electronic equipment like smartphones or pads. To do this: Raise your smartphone or pad to look at it instead of bending your head or neck to look down. Put the smartphone or pad at the level of your face while looking at the screen. Follow your treatment plan as told by your health care provider. This may include: Cognitive or behavioral therapy. Acupuncture or massage therapy. Meditation or yoga. Contact a health care provider if: You have pain that is not relieved with rest or medicine. You have increasing pain going down into your legs or buttocks. Your pain does not improve after 2 weeks. You have pain at night. You lose weight without trying. You have a fever or chills. You develop nausea or vomiting. You develop abdominal pain. Get help right away if: You develop new bowel or bladder control problems. You have unusual weakness or numbness in your arms or legs. You feel faint. These symptoms may represent a serious problem that is an emergency. Do not wait to see if the symptoms will go away. Get medical help right away. Call your local emergency services (911 in the [place] Do not drive yourself to the hospital. Summary Acute back pain is sudden and usually short-lived. Use proper lifting techniques. When you bend and lift, use positions that put less stress on your back. Take over-the-counter and prescription medicines only as told by your health care provider, and apply heat or ice as told. This information is not intended to replace advice given to you by your health care provider. Make sure you discuss any questions you have with your health care provider. Document Revised: 01/16/2021 Document Reviewed: 01/16/2021 Elsevier Patient Education  2024 [employer].

## 2023-09-08 NOTE — Assessment & Plan Note (Signed)
She is referred to [facility] at [facility] where she lives.

## 2023-09-13 DIAGNOSIS — M5416 Radiculopathy, lumbar region: Secondary | ICD-10-CM | POA: Diagnosis not present

## 2023-09-13 DIAGNOSIS — M25561 Pain in right knee: Secondary | ICD-10-CM | POA: Diagnosis not present

## 2023-09-13 DIAGNOSIS — M25562 Pain in left knee: Secondary | ICD-10-CM | POA: Diagnosis not present

## 2023-09-19 DIAGNOSIS — M5416 Radiculopathy, lumbar region: Secondary | ICD-10-CM | POA: Diagnosis not present

## 2023-09-19 DIAGNOSIS — M25561 Pain in right knee: Secondary | ICD-10-CM | POA: Diagnosis not present

## 2023-09-19 DIAGNOSIS — M25562 Pain in left knee: Secondary | ICD-10-CM | POA: Diagnosis not present

## 2023-09-20 DIAGNOSIS — M25562 Pain in left knee: Secondary | ICD-10-CM | POA: Diagnosis not present

## 2023-09-20 DIAGNOSIS — M5416 Radiculopathy, lumbar region: Secondary | ICD-10-CM | POA: Diagnosis not present

## 2023-09-20 DIAGNOSIS — M25561 Pain in right knee: Secondary | ICD-10-CM | POA: Diagnosis not present

## 2023-09-21 DIAGNOSIS — Z1231 Encounter for screening mammogram for malignant neoplasm of breast: Secondary | ICD-10-CM | POA: Diagnosis not present

## 2023-09-21 LAB — HM MAMMOGRAPHY

## 2023-09-22 ENCOUNTER — Encounter: Payer: Self-pay | Admitting: Family Medicine

## 2023-09-23 DIAGNOSIS — M5416 Radiculopathy, lumbar region: Secondary | ICD-10-CM | POA: Diagnosis not present

## 2023-09-23 DIAGNOSIS — M25562 Pain in left knee: Secondary | ICD-10-CM | POA: Diagnosis not present

## 2023-09-23 DIAGNOSIS — M25561 Pain in right knee: Secondary | ICD-10-CM | POA: Diagnosis not present

## 2023-09-26 DIAGNOSIS — M25561 Pain in right knee: Secondary | ICD-10-CM | POA: Diagnosis not present

## 2023-09-26 DIAGNOSIS — M25562 Pain in left knee: Secondary | ICD-10-CM | POA: Diagnosis not present

## 2023-09-26 DIAGNOSIS — M5416 Radiculopathy, lumbar region: Secondary | ICD-10-CM | POA: Diagnosis not present

## 2023-09-27 DIAGNOSIS — H40053 Ocular hypertension, bilateral: Secondary | ICD-10-CM | POA: Diagnosis not present

## 2023-09-28 DIAGNOSIS — M25562 Pain in left knee: Secondary | ICD-10-CM | POA: Diagnosis not present

## 2023-09-28 DIAGNOSIS — M25561 Pain in right knee: Secondary | ICD-10-CM | POA: Diagnosis not present

## 2023-09-28 DIAGNOSIS — M5416 Radiculopathy, lumbar region: Secondary | ICD-10-CM | POA: Diagnosis not present

## 2023-09-30 DIAGNOSIS — M25562 Pain in left knee: Secondary | ICD-10-CM | POA: Diagnosis not present

## 2023-09-30 DIAGNOSIS — M5416 Radiculopathy, lumbar region: Secondary | ICD-10-CM | POA: Diagnosis not present

## 2023-09-30 DIAGNOSIS — M25561 Pain in right knee: Secondary | ICD-10-CM | POA: Diagnosis not present

## 2023-10-03 DIAGNOSIS — M5416 Radiculopathy, lumbar region: Secondary | ICD-10-CM | POA: Diagnosis not present

## 2023-10-03 DIAGNOSIS — M25562 Pain in left knee: Secondary | ICD-10-CM | POA: Diagnosis not present

## 2023-10-03 DIAGNOSIS — M25561 Pain in right knee: Secondary | ICD-10-CM | POA: Diagnosis not present

## 2023-10-07 DIAGNOSIS — M5416 Radiculopathy, lumbar region: Secondary | ICD-10-CM | POA: Diagnosis not present

## 2023-10-07 DIAGNOSIS — M25561 Pain in right knee: Secondary | ICD-10-CM | POA: Diagnosis not present

## 2023-10-07 DIAGNOSIS — M25562 Pain in left knee: Secondary | ICD-10-CM | POA: Diagnosis not present

## 2023-10-10 ENCOUNTER — Ambulatory Visit: Payer: Medicare Other | Admitting: Hematology and Oncology

## 2023-10-10 ENCOUNTER — Inpatient Hospital Stay: Payer: Medicare Other | Attending: Hematology and Oncology | Admitting: Hematology and Oncology

## 2023-10-10 VITALS — BP 149/69 | HR 99 | Temp 98.3°F | Resp 16 | Wt 136.4 lb

## 2023-10-10 DIAGNOSIS — Z800 Family history of malignant neoplasm of digestive organs: Secondary | ICD-10-CM | POA: Diagnosis not present

## 2023-10-10 DIAGNOSIS — Z79810 Long term (current) use of selective estrogen receptor modulators (SERMs): Secondary | ICD-10-CM | POA: Diagnosis not present

## 2023-10-10 DIAGNOSIS — C50311 Malignant neoplasm of lower-inner quadrant of right female breast: Secondary | ICD-10-CM | POA: Diagnosis not present

## 2023-10-10 DIAGNOSIS — Z8041 Family history of malignant neoplasm of ovary: Secondary | ICD-10-CM | POA: Diagnosis not present

## 2023-10-10 DIAGNOSIS — M8580 Other specified disorders of bone density and structure, unspecified site: Secondary | ICD-10-CM | POA: Diagnosis not present

## 2023-10-10 DIAGNOSIS — M5030 Other cervical disc degeneration, unspecified cervical region: Secondary | ICD-10-CM | POA: Insufficient documentation

## 2023-10-10 DIAGNOSIS — R262 Difficulty in walking, not elsewhere classified: Secondary | ICD-10-CM | POA: Diagnosis not present

## 2023-10-10 DIAGNOSIS — R232 Flushing: Secondary | ICD-10-CM | POA: Insufficient documentation

## 2023-10-10 DIAGNOSIS — E785 Hyperlipidemia, unspecified: Secondary | ICD-10-CM | POA: Diagnosis not present

## 2023-10-10 DIAGNOSIS — I10 Essential (primary) hypertension: Secondary | ICD-10-CM | POA: Diagnosis not present

## 2023-10-10 DIAGNOSIS — C50511 Malignant neoplasm of lower-outer quadrant of right female breast: Secondary | ICD-10-CM | POA: Diagnosis not present

## 2023-10-10 DIAGNOSIS — Z8042 Family history of malignant neoplasm of prostate: Secondary | ICD-10-CM | POA: Insufficient documentation

## 2023-10-10 DIAGNOSIS — Z7982 Long term (current) use of aspirin: Secondary | ICD-10-CM | POA: Insufficient documentation

## 2023-10-10 DIAGNOSIS — E559 Vitamin D deficiency, unspecified: Secondary | ICD-10-CM | POA: Diagnosis not present

## 2023-10-10 DIAGNOSIS — R21 Rash and other nonspecific skin eruption: Secondary | ICD-10-CM | POA: Insufficient documentation

## 2023-10-10 DIAGNOSIS — Z85828 Personal history of other malignant neoplasm of skin: Secondary | ICD-10-CM | POA: Diagnosis not present

## 2023-10-10 DIAGNOSIS — I890 Lymphedema, not elsewhere classified: Secondary | ICD-10-CM | POA: Insufficient documentation

## 2023-10-10 DIAGNOSIS — Z79899 Other long term (current) drug therapy: Secondary | ICD-10-CM | POA: Diagnosis not present

## 2023-10-10 DIAGNOSIS — E039 Hypothyroidism, unspecified: Secondary | ICD-10-CM | POA: Diagnosis not present

## 2023-10-10 DIAGNOSIS — Z170 Estrogen receptor positive status [ER+]: Secondary | ICD-10-CM | POA: Diagnosis not present

## 2023-10-10 DIAGNOSIS — Z803 Family history of malignant neoplasm of breast: Secondary | ICD-10-CM | POA: Insufficient documentation

## 2023-10-10 NOTE — Progress Notes (Signed)
[facility]  [phone]) [phone] Fax:([phone]) [phone]    ID: Tammy Grimes DOB: September 08, 1946  MR#: [id number]  [facility]U#:[id number]  Patient Care Team: Bradd Canary, MD as PCP - General (Family Medicine) [place], Amy, MD as Consulting Physician (Dermatology) Jethro Bolus, MD as Consulting Physician (Ophthalmology) Almond Lint, MD as Consulting Physician (General Surgery) Magrinat, Valentino Hue, MD (Inactive) as Consulting Physician (Oncology) Dorothy Puffer, MD as Consulting Physician (Radiation Oncology) Iva Boop, MD as Consulting Physician (Gastroenterology) Pershing Proud, RN as Oncology Nurse Navigator Donnelly Angelica, RN as Oncology Nurse Navigator Sheran Luz, MD as Consulting Physician (Physical Medicine and Rehabilitation) OTHER MD: Dr. Lianne Cure, Dr. Sharyne Richters DDS PA   CHIEF COMPLAINT: Estrogen receptor positive breast cancer  CURRENT TREATMENT: Tamoxifen   INTERVAL HISTORY:  Tammy Grimes returns today for follow-up of her estrogen receptor positive breast cancer.   Discussed the use of AI scribe software for clinical note transcription with the patient, who gave verbal consent to proceed.  History of Present Illness    The patient, with a history of breast cancer, osteopenia, and lymphedema, presents for a routine follow-up. She has been on tamoxifen for almost five years following surgery and is due to stop the medication in April. She reports no significant side effects from the tamoxifen, although she has always had joint problems and is unsure if the medication has exacerbated these. She occasionally experiences hot flashes.  The patient also reports a rash under her arm that has been present for a few months. She initially thought it might be due to her bra rubbing, but the rash has persisted. She has been managing her lymphedema with massage, but notes that it can become sore and swollen, particularly if she overuses her arm.  The patient is  currently undergoing physical therapy for back and leg issues at [facility] community. She reports that the therapy is helping, but she is still unable to walk long distances.   COVID 19 VACCINATION STATUS: Pfizer x4, last 04/2021; infection 09/15/2021    HISTORY OF CURRENT ILLNESS: From the original intake note:  "Tammy Grimes" had routine screening mammography on 09/11/2018 showing a possible abnormality in the right breast. She underwent right unilateral diagnostic mammography with tomography and right breast ultrasonography at [facility] on 09/21/2018 showing: Breast Density Category C. 1.3 cm irregular focal asymmetry in the right breast lower inner aspect anterior depth 4 cm from the nipple noted on the mammogram, but on the ultrasound this measured at [2.8] cm. The irregular mass is hypoechoic with posterior acoustic shadowing. Color flow imaging demonstrates increased vascularity. Elastography imaging assessment is hard. No significant abnormalities were seen in the right axilla.   Accordingly on 09/28/2018 she proceeded to biopsy of the right breast area in question. The pathology from this procedure showed ([id number]): invasive ductal carcinoma, Grade III. Prognostic indicators significant for: estrogen receptor, 95% positive and progesterone receptor, 95% positive, both with strong staining intensity. Proliferation marker Ki67 at 15%. HER2 negative immunohistochemcal and morphometric analysis 1+.  The patient's subsequent history is as detailed below.   PAST MEDICAL HISTORY: Past Medical History:  Diagnosis Date   Allergy    Anemia    prior to hysterectomy   BCC (basal cell carcinoma) 08/01/2015   Breast cancer (HCC) 09/25/2018   right   Cancer (HCC) 2007   calf of right leg- melanoma   Cataract    Cervical cancer screening 06/06/2012   DDD (degenerative disc disease), cervical 08/01/2015  Family history of bladder cancer    Family history of breast cancer    Family history of colon  cancer    Family history of ovarian cancer    Family history of prostate cancer    GERD (gastroesophageal reflux disease)    Hyperglycemia 03/28/2017   Hyperlipidemia    Hypertension 65   Hypothyroidism    Knee pain 03/30/2012   L>R   Low back pain 03/23/2018   Melanoma of skin (HCC) 08/19/2016   PONV (postoperative nausea and vomiting)    Thyroid disease 64   Vitamin D deficiency 06/06/2012    PAST SURGICAL HISTORY: Past Surgical History:  Procedure Laterality Date   ABDOMINAL HYSTERECTOMY  1990's   total, for heavy bleeding and fibroids   BASAL CELL CARCINOMA EXCISION     BREAST LUMPECTOMY WITH RADIOACTIVE SEED AND SENTINEL LYMPH NODE BIOPSY Right 11/07/2018   Procedure: RIGHT BREAST LUMPECTOMY WITH RADIOACTIVE SEED AND SENTINEL LYMPH NODE BIOPSY;  Surgeon: Almond Lint, MD;  Location: [facility];  Service: General;  Laterality: Right;   BREAST SURGERY  early 70's   fibroid tumors removed- benign   CATARACT EXTRACTION Bilateral 12/09/12   COLONOSCOPY     EYE SURGERY Bilateral 2015   cataracts   MELANOMA EXCISION  2007    FAMILY HISTORY: Family History  Problem Relation Age of Onset   Hypertension Mother    Alzheimer's disease Mother    Dementia Mother        alzheimer's   Aortic aneurysm Father    Hypertension Father    COPD Father        smoker   Heart disease Father        s/p bypass, aortic aneurysm, carotid artery disease   Breast cancer Sister 5   Diabetes Son 22       type 1   Alzheimer's disease Maternal Grandmother    Prostate cancer Maternal Grandfather 60       metastatic   Stroke Paternal Grandfather    Ovarian cancer Maternal Aunt 33   Cancer Maternal Aunt        cervical or uterine   Colon cancer Paternal Uncle 6   Breast cancer Cousin 55   Breast cancer Cousin 55   Colon cancer Cousin        dx >50   Esophageal cancer Neg Hx    Pancreatic cancer Neg Hx    Rectal cancer Neg Hx    Stomach cancer Neg Hx   Her father died from  COPD at age 37. Patients' mother died from alzheimer's at age 75. The patient has no brothers, 1 sister. Her sister, Andrey Campanile, had breast cancer at the age of 70.  Imelda Pillow, is employed with the [employer].One of their maternal aunts had ovarian cancer, and a second maternal aunt had either uterine or cervical cancer. Tammy Grimes had Melanoma in situ on her right calf.   GYNECOLOGIC HISTORY:  Menarche: 77 years old Age at first live birth: 77 years old GX P: 1 LMP: No LMP recorded. Patient has had a hysterectomy. 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 She worked with 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 Her husband, Tammy Grimes, worked as a Civil Service fast streamer for an Clinical research associate.  He has now retired.  Tammy Grimes  has one son, Musician, age 54, who lives in [place] and is a Research scientist (life sciences) as well as a Management consultant.  The patient's granddaughter recently graduated from [employer] with the summa cum laude in physical therapy and already has a great job.  She is planning to marry November 2023.   ADVANCED DIRECTIVES: Her husband, Tammy Grimes is Cytogeneticist (MPOA). If Tammy Grimes is unable, then her son, Tammy Grimes, would assume MPOA.    HEALTH MAINTENANCE: Social History   Tobacco Use   Smoking status: Never   Smokeless tobacco: Never  Vaping Use   Vaping status: Never Used  Substance Use Topics   Alcohol use: No   Drug use: No    Colonoscopy: yes, 06/07/2017  PAP:   Bone density: yes, 07/25/2018   Allergies  Allergen Reactions   Sodium Bisphosphate [Sodium Phosphate] Other (See Comments)    arthralgias   Penicillins Rash    Current Outpatient Medications  Medication Sig Dispense Refill   amLODipine (NORVASC) 5 MG tablet TAKE 1 TABLET BY MOUTH  AT  BEDTIME 100 tablet 2   aspirin 81 MG tablet Take 81 mg by mouth daily.     calcium-vitamin D 250-100 MG-UNIT per tablet Take 1 tablet by mouth daily.     celecoxib (CELEBREX) 100 MG capsule Take 1 capsule (100 mg total) by mouth 2 (two) times daily as needed. 40 capsule 2   cetirizine (ZYRTEC) 10 MG tablet Take 10 mg by mouth daily as needed for allergies.     levothyroxine (SYNTHROID) 88 MCG tablet TAKE 1 TABLET BY MOUTH DAILY 100 tablet 2   metoprolol tartrate (LOPRESSOR) 25 MG tablet TAKE ONE-HALF TABLET BY MOUTH  TWICE DAILY 100 tablet 2   Multiple Vitamins-Minerals (CENTRUM SILVER 50+WOMEN) TABS Use as directed     Omega-3 Fatty Acids (OMEGA 3 500) 500 MG CAPS Take 1 capsule by mouth daily.     omeprazole (PRILOSEC) 20 MG capsule TAKE 1 CAPSULE BY MOUTH DAILY 100 capsule 2   rosuvastatin (CRESTOR) 5 MG tablet TAKE 1 TABLET BY MOUTH DAILY 100 tablet 2   tamoxifen (NOLVADEX) 20 MG tablet TAKE 1 TABLET BY MOUTH DAILY 90 tablet 4   tiZANidine (ZANAFLEX) 2 MG tablet Take 0.5-2 tablets (1-4 mg total) by mouth every 8 (eight) hours as needed for muscle spasms. 40 tablet 2   TURMERIC CURCUMIN PO Take 1,650 mg by mouth daily.     No current facility-administered medications for this visit.    OBJECTIVE: white woman in no acute distress  Vitals:   10/10/23 1011  BP: (!) 149/69  Pulse: 99  Resp: 16  Temp: 98.3 F (36.8 C)  SpO2: 99%      Body mass index is 25.77 kg/m.   Wt Readings from Last 3 Encounters:  10/10/23 136 lb 6.4 oz (61.9 kg)  09/08/23 136 lb 3.2 oz (61.8 kg)  03/03/23 132 lb 3.2 oz (60 kg)      ECOG FS:1 - Symptomatic but completely ambulatory  Sclerae unicteric, EOMs intact Wearing a mask No cervical or supraclavicular adenopathy Bilateral breasts with no obvious palpable masses or regional adenopathy No visible rash on exam today  LAB RESULTS:  CMP     Component Value Date/Time   NA 144 09/08/2023 1105   K 4.6 09/08/2023 1105   CL 105 09/08/2023 1105    CO2 27 09/08/2023 1105   GLUCOSE 107 (H) 09/08/2023 1105   BUN 15 09/08/2023 1105  CREATININE 0.67 09/08/2023 1105   CREATININE 0.69 10/08/2022 1041   CREATININE 0.63 07/22/2020 1008   CALCIUM 9.7 09/08/2023 1105   PROT 6.9 09/08/2023 1105   ALBUMIN 4.7 09/08/2023 1105   AST 51 (H) 09/08/2023 1105   AST 24 10/08/2022 1041   ALT 58 (H) 09/08/2023 1105   ALT 27 10/08/2022 1041   ALKPHOS 43 09/08/2023 1105   BILITOT 0.5 09/08/2023 1105   BILITOT 0.4 10/08/2022 1041   GFRNONAA >60 10/08/2022 1041   GFRAA >60 09/18/2019 1340   GFRAA >60 05/22/2019 1213    Lab Results  Component Value Date   WBC 5.8 09/08/2023   NEUTROABS 3.5 09/08/2023   HGB 13.1 09/08/2023   HCT 41.6 09/08/2023   MCV 96.9 09/08/2023   PLT 192.0 09/08/2023   No results found for: "LABCA2"  No components found for: "OZDGUY403"  No results for input(s): "INR" in the last 168 hours.  No results found for: "LABCA2"  No results found for: "KVQ259"  No results found for: "CAN125"  No results found for: "CAN153"  No results found for: "CA2729"  No components found for: "HGQUANT"  No results found for: "CEA1", "CEA" / No results found for: "CEA1", "CEA"   No results found for: "AFPTUMOR"  No results found for: "CHROMOGRNA"  No results found for: "TOTALPROTELP", "ALBUMINELP", "A1GS", "A2GS", "BETS", "BETA2SER", "GAMS", "MSPIKE", "SPEI" (this displays SPEP labs)  No results found for: "KPAFRELGTCHN", "LAMBDASER", "KAPLAMBRATIO" (kappa/lambda light chains)  No results found for: "HGBA", "HGBA2QUANT", "HGBFQUANT", "HGBSQUAN" (Hemoglobinopathy evaluation)   No results found for: "LDH"  No results found for: "IRON", "TIBC", "IRONPCTSAT" (Iron and TIBC)  No results found for: "FERRITIN"  Urinalysis    Component Value Date/Time   COLORURINE YELLOW 03/03/2023 1107   APPEARANCEUR Cloudy (A) 03/03/2023 1107   LABSPEC 1.025 03/03/2023 1107   PHURINE 6.0 03/03/2023 1107   GLUCOSEU NEGATIVE  03/03/2023 1107   HGBUR NEGATIVE 03/03/2023 1107   BILIRUBINUR NEGATIVE 03/03/2023 1107   KETONESUR NEGATIVE 03/03/2023 1107   UROBILINOGEN 0.2 03/03/2023 1107   NITRITE POSITIVE (A) 03/03/2023 1107   LEUKOCYTESUR TRACE (A) 03/03/2023 1107     STUDIES:  No results found.  Personal History Of Breast Cancer Personal history of treated right breast carcinoma, status post right lumpectomy 2019 Lower Inner Quadrant.  Digital breast tomosynthesis imaging has been obtained as part of this examination. Comparison is made to exam(s) dated: 09/13/2019, 09/15/2020. Breast Composition Category C: The breast tissue is heterogeneously dense, which may obscure small masses.  Current study was also evaluated with a Computer Aided Detection (CAD) system. Post surgical distortion and density at the lumpectomy site in the right upper outer breast are unchanged. Benign post operative findings are seen in the left breast. No significant masses, calcifications, or other findings are seen in either breast.  IMPRESSION: BENIGN Stable right lumpectomy. There is no mammographic evidence of malignancy. Routine mammographic evaluation in 1 year is recommended.(09/11/2022)  This exam was interpreted at the [facility] location.  Mayford Knife M.D. sl/penrad:09/10/2021 10:43:17    ELIGIBLE FOR AVAILABLE RESEARCH PROTOCOL: no   ASSESSMENT: 77 y.o. [place] woman status post right breast lower inner quadrant biopsy 09/27/2018 for a clinical T1c N0 invasive ductal carcinoma, grade 3, estrogen and progesterone receptor positive, HER-2 not amplified, with an MIB-1 of 15%  (1) Genetic testing performed through [facility] Common Hereditary Cancers panel + Melanoma panel on 10/11/2018 showed no pathogenic mutations in APC, ATM, AXIN2, BAP1, BARD1, BMPR1A, BRCA1, BRCA2, BRIP1, BUB1B, CDH1,  CDK4, CDKN2A, CHEK2, CTNNA1, DICER1, ENG, EPCAM, GALNT12, GREM1, HOXB13, KIT, MEN1, MITF, MLH1, MLH3, MSH2,  MSH3, MSH6, MUTYH, NBN, NF1, NTHL1, PALB2, PDGFRA, PMS2, POLD1, POLE, POT1 PTEN, RAD50, RAD51C, RAD51D, RB1, RNF43, RPS20, SDHA, SDHB, SDHC, SDHD, SMAD4, SMARCA4, STK11, TP53, TSC1, TSC2, VHL.  (2) status post right lumpectomy and sentinel lymph node sampling 11/07/2018 for a pT2 pN0, stage IIA, grade 3 with a positive anterior margin  (a) a total of 7 lymph nodes were removed  (3) The Oncotype DX score was 10 predicting a risk of outside the breast recurrence over the next 9 years of 3% if the patient's only systemic therapy is tamoxifen for 5 years. It also predicts <1% benefit from chemotherapy.  (4) adjuvant radiation 12/14/2018 - 01/11/2019  (a) Right breast / 42.56 Gy in 16 fractions  (b) Seroma boost / 8 Gy in 4 fractions  (5) started tamoxifen 02/07/2019  (a) D[facility]A scan at [facility] 07/25/2018 showed a T score of -2.0  (b) repeat DEXA scan at [facility] 09/15/2020 shows a T score of -1.8   PLAN:  Breast Cancer Five years post-surgery, currently on Tamoxifen with no significant side effects. Mammogram results are good,patient has dense breasts. -Discontinue Tamoxifen in April 2025. -Continue monthly self-breast exams and report any changes. -Next mammogram scheduled for November 2025.  Lymphedema Chronic issue due to lymph node removal. Patient manages with self-massage. -Continue self-management and report any significant changes or worsening.  Osteopenia Noted on recent bone density scan. Patient currently undergoing physical therapy for back and leg issues. -Continue weight-bearing exercises as tolerated. -Continue Vitamin D and Calcium supplementation.  Follow-up -Schedule appointment for Fall 2025.  Total time spent: 30 min  *Total Encounter Time as defined by the Centers for Medicare and Medicaid Services includes, in addition to the face-to-face time of a patient visit (documented in the note above) non-face-to-face time: obtaining and reviewing outside history, ordering and  reviewing medications, tests or procedures, care coordination (communications with other health care professionals or caregivers) and documentation in the medical record.

## 2023-10-12 DIAGNOSIS — M25562 Pain in left knee: Secondary | ICD-10-CM | POA: Diagnosis not present

## 2023-10-12 DIAGNOSIS — M25561 Pain in right knee: Secondary | ICD-10-CM | POA: Diagnosis not present

## 2023-10-12 DIAGNOSIS — M5416 Radiculopathy, lumbar region: Secondary | ICD-10-CM | POA: Diagnosis not present

## 2023-10-14 DIAGNOSIS — M25562 Pain in left knee: Secondary | ICD-10-CM | POA: Diagnosis not present

## 2023-10-14 DIAGNOSIS — M5416 Radiculopathy, lumbar region: Secondary | ICD-10-CM | POA: Diagnosis not present

## 2023-10-14 DIAGNOSIS — M25561 Pain in right knee: Secondary | ICD-10-CM | POA: Diagnosis not present

## 2023-10-20 DIAGNOSIS — M25562 Pain in left knee: Secondary | ICD-10-CM | POA: Diagnosis not present

## 2023-10-20 DIAGNOSIS — M5416 Radiculopathy, lumbar region: Secondary | ICD-10-CM | POA: Diagnosis not present

## 2023-10-20 DIAGNOSIS — M25561 Pain in right knee: Secondary | ICD-10-CM | POA: Diagnosis not present

## 2023-10-26 DIAGNOSIS — M25562 Pain in left knee: Secondary | ICD-10-CM | POA: Diagnosis not present

## 2023-10-26 DIAGNOSIS — M25561 Pain in right knee: Secondary | ICD-10-CM | POA: Diagnosis not present

## 2023-10-26 DIAGNOSIS — M5416 Radiculopathy, lumbar region: Secondary | ICD-10-CM | POA: Diagnosis not present

## 2023-10-31 DIAGNOSIS — M25562 Pain in left knee: Secondary | ICD-10-CM | POA: Diagnosis not present

## 2023-10-31 DIAGNOSIS — M5416 Radiculopathy, lumbar region: Secondary | ICD-10-CM | POA: Diagnosis not present

## 2023-10-31 DIAGNOSIS — M25561 Pain in right knee: Secondary | ICD-10-CM | POA: Diagnosis not present

## 2023-11-03 DIAGNOSIS — M5416 Radiculopathy, lumbar region: Secondary | ICD-10-CM | POA: Diagnosis not present

## 2023-11-03 DIAGNOSIS — M25561 Pain in right knee: Secondary | ICD-10-CM | POA: Diagnosis not present

## 2023-11-03 DIAGNOSIS — M25562 Pain in left knee: Secondary | ICD-10-CM | POA: Diagnosis not present

## 2023-11-13 ENCOUNTER — Other Ambulatory Visit: Payer: Self-pay | Admitting: Family Medicine

## 2023-11-18 ENCOUNTER — Other Ambulatory Visit: Payer: Self-pay | Admitting: Hematology and Oncology

## 2024-01-23 ENCOUNTER — Other Ambulatory Visit: Payer: Self-pay | Admitting: Family Medicine

## 2024-01-23 ENCOUNTER — Encounter: Payer: Self-pay | Admitting: Family Medicine

## 2024-01-23 MED ORDER — TIZANIDINE HCL 2 MG PO TABS: 1.0000 mg | ORAL_TABLET | Freq: Three times a day (TID) | ORAL | 2 refills | Status: DC | PRN

## 2024-01-27 ENCOUNTER — Emergency Department (HOSPITAL_BASED_OUTPATIENT_CLINIC_OR_DEPARTMENT_OTHER)
Admission: EM | Admit: 2024-01-27 | Discharge: 2024-01-27 | Disposition: A | Discharge status: Home / Self Care | Attending: Emergency Medicine | Admitting: Emergency Medicine

## 2024-01-27 ENCOUNTER — Emergency Department (HOSPITAL_BASED_OUTPATIENT_CLINIC_OR_DEPARTMENT_OTHER)

## 2024-01-27 ENCOUNTER — Other Ambulatory Visit: Payer: Self-pay

## 2024-01-27 ENCOUNTER — Encounter (HOSPITAL_BASED_OUTPATIENT_CLINIC_OR_DEPARTMENT_OTHER): Payer: Self-pay | Admitting: Emergency Medicine

## 2024-01-27 DIAGNOSIS — Y92002 Bathroom of unspecified non-institutional (private) residence single-family (private) house as the place of occurrence of the external cause: Secondary | ICD-10-CM | POA: Diagnosis not present

## 2024-01-27 DIAGNOSIS — W01198A Fall on same level from slipping, tripping and stumbling with subsequent striking against other object, initial encounter: Secondary | ICD-10-CM | POA: Insufficient documentation

## 2024-01-27 DIAGNOSIS — W19XXXA Unspecified fall, initial encounter: Secondary | ICD-10-CM

## 2024-01-27 DIAGNOSIS — S0990XA Unspecified injury of head, initial encounter: Secondary | ICD-10-CM | POA: Diagnosis not present

## 2024-01-27 DIAGNOSIS — I10 Essential (primary) hypertension: Secondary | ICD-10-CM | POA: Insufficient documentation

## 2024-01-27 DIAGNOSIS — Z043 Encounter for examination and observation following other accident: Secondary | ICD-10-CM | POA: Diagnosis not present

## 2024-01-27 DIAGNOSIS — S52592A Other fractures of lower end of left radius, initial encounter for closed fracture: Secondary | ICD-10-CM | POA: Diagnosis not present

## 2024-01-27 DIAGNOSIS — I6523 Occlusion and stenosis of bilateral carotid arteries: Secondary | ICD-10-CM | POA: Diagnosis not present

## 2024-01-27 DIAGNOSIS — Z7982 Long term (current) use of aspirin: Secondary | ICD-10-CM | POA: Diagnosis not present

## 2024-01-27 DIAGNOSIS — Z79899 Other long term (current) drug therapy: Secondary | ICD-10-CM | POA: Insufficient documentation

## 2024-01-27 DIAGNOSIS — Z853 Personal history of malignant neoplasm of breast: Secondary | ICD-10-CM | POA: Diagnosis not present

## 2024-01-27 DIAGNOSIS — M4802 Spinal stenosis, cervical region: Secondary | ICD-10-CM | POA: Diagnosis not present

## 2024-01-27 DIAGNOSIS — S52612D Displaced fracture of left ulna styloid process, subsequent encounter for closed fracture with routine healing: Secondary | ICD-10-CM | POA: Diagnosis not present

## 2024-01-27 DIAGNOSIS — M25532 Pain in left wrist: Secondary | ICD-10-CM | POA: Diagnosis present

## 2024-01-27 DIAGNOSIS — S52572A Other intraarticular fracture of lower end of left radius, initial encounter for closed fracture: Secondary | ICD-10-CM | POA: Diagnosis not present

## 2024-01-27 DIAGNOSIS — S52692A Other fracture of lower end of left ulna, initial encounter for closed fracture: Secondary | ICD-10-CM | POA: Diagnosis not present

## 2024-01-27 DIAGNOSIS — Y93E5 Activity, floor mopping and cleaning: Secondary | ICD-10-CM | POA: Diagnosis not present

## 2024-01-27 DIAGNOSIS — S52502A Unspecified fracture of the lower end of left radius, initial encounter for closed fracture: Secondary | ICD-10-CM

## 2024-01-27 DIAGNOSIS — S52602A Unspecified fracture of lower end of left ulna, initial encounter for closed fracture: Secondary | ICD-10-CM | POA: Diagnosis not present

## 2024-01-27 DIAGNOSIS — Z471 Aftercare following joint replacement surgery: Secondary | ICD-10-CM | POA: Diagnosis not present

## 2024-01-27 MED ORDER — LIDOCAINE HCL 2 % IJ SOLN
10.0000 mL | Freq: Once | INTRAMUSCULAR | Status: AC
  Administered 2024-01-27: 200 mg
  Filled 2024-01-27: qty 20

## 2024-01-27 MED ORDER — HYDROMORPHONE HCL 1 MG/ML IJ SOLN
1.0000 mg | Freq: Once | INTRAMUSCULAR | Status: AC
  Administered 2024-01-27: 1 mg via INTRAVENOUS
  Filled 2024-01-27: qty 1

## 2024-01-27 MED ORDER — HYDROCODONE-ACETAMINOPHEN 5-325 MG PO TABS
1.0000 | ORAL_TABLET | Freq: Once | ORAL | Status: AC
  Administered 2024-01-27: 1 via ORAL
  Filled 2024-01-27: qty 1

## 2024-01-27 MED ORDER — ONDANSETRON 4 MG PO TBDP
4.0000 mg | ORAL_TABLET | Freq: Once | ORAL | Status: AC
  Administered 2024-01-27: 4 mg via ORAL
  Filled 2024-01-27: qty 1

## 2024-01-27 MED ORDER — ONDANSETRON 4 MG PO TBDP: 4.0000 mg | ORAL_TABLET | Freq: Three times a day (TID) | ORAL | 0 refills | Status: DC | PRN

## 2024-01-27 MED ORDER — HYDROCODONE-ACETAMINOPHEN 5-325 MG PO TABS: 1.0000 | ORAL_TABLET | ORAL | 0 refills | Status: DC | PRN

## 2024-01-27 NOTE — Discharge Instructions (Addendum)
 You were seen in the emergency department after a fall.  Your head scans were normal.  Your wrist x-ray showed a broken radius, and a very small ulnar fracture.  These are the 2 bones from your forearm that make up your wrist.  We were able to reduce the fractures and splint them.  After reviewing the images, they appear to be in much better alignment.  I called the hand surgeon, and they would like you to follow-up in their clinic next week.  I have attached their contact information for you to call and make an appointment.  I have sent you with a course of some pain medication to your pharmacy.  You can take this every 4 hours as needed, you can also take ibuprofen.

## 2024-01-27 NOTE — ED Triage Notes (Signed)
 Pt POV steady gait- pt reports mechanical fall appx 30 min PTA, c/o L forearm pain and swelling, having difficulty moving extremity.   Reports hitting back of head against kitchen cabinet, denies LOC.

## 2024-01-27 NOTE — ED Provider Notes (Signed)
 [facility] [facility] Provider Note   CSN: [id number] Arrival date & time: 01/27/24  1610     History  Chief Complaint  Patient presents with   Tammy Grimes is a 78 y.o. female with history of thyroid disease, GERD, hypertension, anemia, hyperlipidemia, vitamin D deficiency, cervical degenerative disc disease, breast cancer, who presents the emergency department after mechanical fall.  Patient was bent over cleaning the base of her shower, when her husband surprised her by walking in the room, and it scared her causing her to fall.  She struck the back of her head against the bathroom cabinet.  She did not lose consciousness.  She is mainly complaining of pain in her left wrist and is having difficulty moving it.  She is not taking any blood thinners. She is left hand dominant.    Fall       Home Medications Prior to Admission medications   Medication Sig Start Date End Date Taking? Authorizing Provider  HYDROcodone-acetaminophen (NORCO/VICODIN) 5-325 MG tablet Take 1 tablet by mouth every 4 (four) hours as needed. 01/27/24  Yes Corrisa Gibby T, PA-C  ondansetron (ZOFRAN-ODT) 4 MG disintegrating tablet Take 1 tablet (4 mg total) by mouth every 8 (eight) hours as needed for nausea or vomiting. 01/27/24  Yes Layni Kreamer T, PA-C  amLODipine (NORVASC) 5 MG tablet TAKE 1 TABLET BY MOUTH AT  BEDTIME 07/18/23   Bradd Canary, MD  aspirin 81 MG tablet Take 81 mg by mouth daily.    [provider]  calcium-vitamin D 250-100 MG-UNIT per tablet Take 1 tablet by mouth daily.    [provider]  celecoxib (CELEBREX) 100 MG capsule Take 1 capsule (100 mg total) by mouth 2 (two) times daily as needed. 09/08/23   Bradd Canary, MD  cetirizine (ZYRTEC) 10 MG tablet Take 10 mg by mouth daily as needed for allergies.    [provider]  levothyroxine (SYNTHROID) 88 MCG tablet TAKE 1 TABLET BY MOUTH DAILY 11/14/23   Bradd Canary, MD  metoprolol tartrate (LOPRESSOR) 25 MG tablet TAKE ONE-HALF TABLET BY MOUTH  TWICE DAILY 08/04/23   Bradd Canary, MD  Multiple Vitamins-Minerals (CENTRUM SILVER 50+WOMEN) TABS Use as directed 01/07/19   [provider]  Omega-3 Fatty Acids (OMEGA 3 500) 500 MG CAPS Take 1 capsule by mouth daily. 11/08/18   [provider]  omeprazole (PRILOSEC) 20 MG capsule TAKE 1 CAPSULE BY MOUTH DAILY 07/18/23   Bradd Canary, MD  rosuvastatin (CRESTOR) 5 MG tablet TAKE 1 TABLET BY MOUTH DAILY 06/22/23   Bradd Canary, MD  tamoxifen (NOLVADEX) 20 MG tablet TAKE 1 TABLET BY MOUTH DAILY 11/21/23   Rachel Moulds, MD  tiZANidine (ZANAFLEX) 2 MG tablet Take 0.5-2 tablets (1-4 mg total) by mouth every 8 (eight) hours as needed for muscle spasms. 01/23/24   Bradd Canary, MD  TURMERIC CURCUMIN PO Take 1,650 mg by mouth daily. 11/08/18   [provider]      Allergies    Sodium bisphosphate [sodium phosphate] and Penicillins    Review of Systems   Review of Systems  Musculoskeletal:  Positive for arthralgias.  All other systems reviewed and are negative.   Physical Exam Updated Vital Signs BP (!) 112/50   Pulse 65   Temp 98.3 F (36.8 C) (Oral)   Resp 18   Ht 5\' 1"  (1.549 m)   Wt 61.2 kg  SpO2 98%   BMI 25.51 kg/m  Physical Exam Vitals and nursing note reviewed.  Constitutional:      Appearance: Normal appearance.  HENT:     Head: Normocephalic and atraumatic.  Eyes:     Conjunctiva/sclera: Conjunctivae normal.  Neck:     Comments: No midline spinal tenderness or deformities palpated Cardiovascular:     Pulses:          Radial pulses are 2+ on the right side and 2+ on the left side.  Pulmonary:     Effort: Pulmonary effort is normal. No respiratory distress.  Musculoskeletal:     Comments: Deformity of the left wrist noted, with reduced ROM due to pain.  Reduced grip strength due to pain, but able to range the digits normally.  Skin:    General:  Skin is warm and dry.     Capillary Refill: Capillary refill takes less than 2 seconds.  Neurological:     Mental Status: She is alert.     Sensory: Sensation is intact.     Comments: Normal sensation of the wrist and the digits on the left  Psychiatric:        Mood and Affect: Mood normal.        Behavior: Behavior normal.     ED Results / Procedures / Treatments   Labs (all labs ordered are listed, but only abnormal results are displayed) Labs Reviewed - No data to display  EKG None  Radiology DG Wrist 2 Views Left Result Date: 01/27/2024 CLINICAL DATA:  Fracture, postreduction. EXAM: LEFT WRIST - 2 VIEW COMPARISON:  Forearm radiograph earlier today FINDINGS: Improved alignment of displaced distal radius fracture. Improved alignment of displaced ulna styloid fracture. No new fracture. Overlying splint material limits osseous and soft tissue fine detail. IMPRESSION: Improved alignment of displaced distal radius and ulna styloid fractures postreduction. Electronically Signed   By: Narda Rutherford M.D.   On: 01/27/2024 20:36   CT Cervical Spine Wo Contrast Result Date: 01/27/2024 CLINICAL DATA:  Mechanical fall.  Hit back of head against cabinet. EXAM: CT CERVICAL SPINE WITHOUT CONTRAST TECHNIQUE: Multidetector CT imaging of the cervical spine was performed without intravenous contrast. Multiplanar CT image reconstructions were also generated. RADIATION DOSE REDUCTION: This exam was performed according to the departmental dose-optimization program which includes automated exposure control, adjustment of the mA and/or kV according to patient size and/or use of iterative reconstruction technique. COMPARISON:  MRI of the cervical spine 02/03/2015 FINDINGS: Alignment: No significant listhesis is present. Cervical lordosis is stable. Skull base and vertebrae: The vertebral body heights are normal. No acute fractures are present. Soft tissues and spinal canal: No prevertebral fluid or swelling. No  visible canal hematoma. Disc levels: Moderate foraminal stenosis is present bilaterally at C4-5 and C5-6. Mild foraminal narrowing is present bilaterally at C6-7. Upper chest: The lung apices are clear. The thoracic inlet is within normal limits. IMPRESSION: 1. No acute fracture or traumatic subluxation. 2. Moderate foraminal stenosis bilaterally at C4-5 and C5-6. 3. Mild foraminal narrowing bilaterally at C6-7. Electronically Signed   By: Marin Roberts M.D.   On: 01/27/2024 20:00   CT Head Wo Contrast Result Date: 01/27/2024 CLINICAL DATA:  Head trauma. Mechanical fall. EXAM: CT HEAD WITHOUT CONTRAST TECHNIQUE: Contiguous axial images were obtained from the base of the skull through the vertex without intravenous contrast. RADIATION DOSE REDUCTION: This exam was performed according to the departmental dose-optimization program which includes automated exposure control, adjustment of the mA and/or kV according  to patient size and/or use of iterative reconstruction technique. COMPARISON:  None Available. FINDINGS: Brain: No acute infarct, hemorrhage, or mass lesion is present. No significant white matter lesions are present. The ventricles are of normal size. Deep brain nuclei are within normal limits. No significant extraaxial fluid collection is present. The brainstem and cerebellum are within normal limits. Midline structures are within normal limits. Vascular: Atherosclerotic changes are present in the cavernous internal carotid arteries and at the dural margin of both vertebral arteries. No hyperdense vessel is present. Skull: Calvarium is intact. No focal lytic or blastic lesions are present. No significant extracranial soft tissue lesion is present. Sinuses/Orbits: The paranasal sinuses and mastoid air cells are clear. Bilateral lens replacements are noted. Globes and orbits are otherwise unremarkable. IMPRESSION: Negative CT of the brain. No acute or focal lesion to explain the patient's symptoms.  Electronically Signed   By: Marin Roberts M.D.   On: 01/27/2024 19:58   DG Forearm Left Result Date: 01/27/2024 CLINICAL DATA:  Fall.  Left forearm injury. EXAM: LEFT FOREARM - 2 VIEW COMPARISON:  None Available. FINDINGS: Fracture of the distal radius is seen with intra-articular extension. Moderate dorsal angulation of the distal articular surface of the radius is noted. No proximal radius or ulnar fractures identified. IMPRESSION: Distal radius fracture with dorsal angulation. Dedicated wrist radiographs are recommended for better evaluation. Electronically Signed   By: Danae Orleans M.D.   On: 01/27/2024 18:10    Procedures .Ortho Injury Treatment  Date/Time: 01/27/2024 8:54 PM  Performed by: Su Monks, PA-C Authorized by: Su Monks, PA-C   Consent:    Consent obtained:  Verbal   Consent given by:  Patient   Risks discussed:  Fracture, recurrent dislocation, restricted joint movement, stiffness and vascular damage   Alternatives discussed:  No treatmentInjury location: wrist Location details: left wrist Injury type: fracture Fracture type: distal radius and ulnar styloid Pre-procedure neurovascular assessment: neurovascularly intact Pre-procedure distal perfusion: normal Pre-procedure neurological function: normal Pre-procedure range of motion: reduced Anesthesia: local infiltration  Anesthesia: Local anesthesia used: yes Local Anesthetic: lidocaine 2% without epinephrine Anesthetic total: 3 mL  Patient sedated: NoManipulation performed: yes Skin traction used: yes Skeletal traction used: yes Reduction successful: yes X-ray confirmed reduction: yes Immobilization: splint and sling Splint type: sugar tong (volar) Splint Applied by: ED Provider Supplies used: Ortho-Glass, cotton padding and elastic bandage Post-procedure neurovascular assessment: post-procedure neurovascularly intact Post-procedure distal perfusion: normal Post-procedure  neurological function: normal Post-procedure range of motion: improved       Medications Ordered in ED Medications  ondansetron (ZOFRAN-ODT) disintegrating tablet 4 mg (has no administration in time range)  HYDROcodone-acetaminophen (NORCO/VICODIN) 5-325 MG per tablet 1 tablet (1 tablet Oral Given 01/27/24 1801)  lidocaine (XYLOCAINE) 2 % (with pres) injection 200 mg (200 mg Infiltration Given by Other 01/27/24 1801)  HYDROmorphone (DILAUDID) injection 1 mg (1 mg Intravenous Given 01/27/24 1937)    ED Course/ Medical Decision Making/ A&P                                 Medical Decision Making Amount and/or Complexity of Data Reviewed Radiology: ordered.  Risk Prescription drug management.   This patient is a 78 y.o. female  who presents to the ED for concern of mechanical fall with head injury, no LOC, and L wrist pain.   Differential diagnoses prior to evaluation: The emergent differential diagnosis includes, but is not limited to,  fractures, dislocation, ligamentous injury, intracranial bleeding. This is not an exhaustive differential.   Past Medical History / Co-morbidities / Social History: thyroid disease, GERD, hypertension, anemia, hyperlipidemia, vitamin D deficiency, cervical degenerative disc disease, breast cancer  Physical Exam: Physical exam performed. The pertinent findings include: Hypertensive, otherwise normal vital signs.  Left wrist deformity, neurovascularly intact.  Head atraumatic, no cervical midline spinal tenderness, step-offs or crepitus.  Lab Tests/Imaging studies: I personally interpreted labs/imaging and the pertinent results include: XR of the left wrist with distal radius fracture with dorsal angulation.  CT head and cervical spine without acute traumatic findings.  Postreduction wrist films show improved alignment of displaced distal radius and ulnar styloid fractures.  I agree with the radiologist interpretation.  Medications: I ordered  medication including norco, received dilaudid just prior to bedside reduction, lidocaine used for hematoma block.  I have reviewed the patients home medicines and have made adjustments as needed. Pt had some hypotension and nausea with dilaudid, kept for almost 2 hours for observation, BP and O2 saturation improved.   Consultations obtained: I consulted with hand surgeon Dr Kerry Fort who recommended: follow up with hand surgery next week   Disposition: After consideration of the diagnostic results and the patients response to treatment, I feel that emergency department workup does not suggest an emergent condition requiring admission or immediate intervention beyond what has been performed at this time. The plan is: discharge to home. Placed in splint and given sling, prescription for pain medication sent to the pharmacy. Given hand surgery follow up. The patient is safe for discharge and has been instructed to return immediately for worsening symptoms, change in symptoms or any other concerns.  Final Clinical Impression(s) / ED Diagnoses Final diagnoses:  Fall, initial encounter  Closed fracture of distal ends of left radius and ulna, initial encounter  Injury of head, initial encounter    Rx / DC Orders ED Discharge Orders          Ordered    HYDROcodone-acetaminophen (NORCO/VICODIN) 5-325 MG tablet  Every 4 hours PRN        01/27/24 2104    ondansetron (ZOFRAN-ODT) 4 MG disintegrating tablet  Every 8 hours PRN        01/27/24 2105           Portions of this report may have been transcribed using voice recognition software. Every effort was made to ensure accuracy; however, inadvertent computerized transcription errors may be present.    Jeanella Flattery 01/27/24 2105    Glyn Ade, MD 01/28/24 1501

## 2024-01-31 DIAGNOSIS — S52572A Other intraarticular fracture of lower end of left radius, initial encounter for closed fracture: Secondary | ICD-10-CM | POA: Diagnosis not present

## 2024-02-13 DIAGNOSIS — S52572A Other intraarticular fracture of lower end of left radius, initial encounter for closed fracture: Secondary | ICD-10-CM | POA: Diagnosis not present

## 2024-02-27 DIAGNOSIS — S52592P Other fractures of lower end of left radius, subsequent encounter for closed fracture with malunion: Secondary | ICD-10-CM | POA: Diagnosis not present

## 2024-02-27 DIAGNOSIS — S52572A Other intraarticular fracture of lower end of left radius, initial encounter for closed fracture: Secondary | ICD-10-CM | POA: Diagnosis not present

## 2024-03-03 ENCOUNTER — Other Ambulatory Visit: Payer: Self-pay | Admitting: Family Medicine

## 2024-03-13 ENCOUNTER — Encounter: Payer: Medicare Other | Admitting: Family Medicine

## 2024-03-19 DIAGNOSIS — S52592P Other fractures of lower end of left radius, subsequent encounter for closed fracture with malunion: Secondary | ICD-10-CM | POA: Diagnosis not present

## 2024-03-24 ENCOUNTER — Other Ambulatory Visit: Payer: Self-pay | Admitting: Family Medicine

## 2024-03-28 ENCOUNTER — Other Ambulatory Visit: Payer: Self-pay | Admitting: Family Medicine

## 2024-04-07 ENCOUNTER — Other Ambulatory Visit: Payer: Self-pay | Admitting: Family Medicine

## 2024-04-19 DIAGNOSIS — S52592P Other fractures of lower end of left radius, subsequent encounter for closed fracture with malunion: Secondary | ICD-10-CM | POA: Diagnosis not present

## 2024-04-25 DIAGNOSIS — M25532 Pain in left wrist: Secondary | ICD-10-CM | POA: Diagnosis not present

## 2024-04-29 NOTE — Assessment & Plan Note (Signed)
 On Levothyroxine, continue to monitor

## 2024-04-29 NOTE — Assessment & Plan Note (Signed)
 [id number] acceptable, minimize simple carbs. Increase exercise as tolerated.

## 2024-04-29 NOTE — Assessment & Plan Note (Addendum)
 Patient encouraged to maintain heart healthy diet, regular exercise, adequate sleep. Consider daily probiotics. Take medications as prescribed. Labs ordered and reviewed. Given and reviewed copy of ACP documents from [employer] and encouraged to complete and return. Aged out of PAP. MGM 09/2023 repeat in 1 year. Dexa 09/2022. Colonoscopy has aged out

## 2024-04-29 NOTE — Assessment & Plan Note (Signed)
 Well controlled, no changes to meds. Encouraged heart healthy diet such as the DASH diet and exercise as tolerated.

## 2024-04-29 NOTE — Assessment & Plan Note (Signed)
 Encourage heart healthy diet such as MIND or DASH diet, increase exercise, avoid trans fats, simple carbohydrates and processed foods, consider a krill or fish or flaxseed oil cap daily. Tolerating Rosuvastatin

## 2024-04-29 NOTE — Assessment & Plan Note (Signed)
 Supplement and monitor

## 2024-05-03 ENCOUNTER — Encounter: Payer: Self-pay | Admitting: Family Medicine

## 2024-05-03 ENCOUNTER — Ambulatory Visit (INDEPENDENT_AMBULATORY_CARE_PROVIDER_SITE_OTHER): Payer: Medicare Other | Admitting: Family Medicine

## 2024-05-03 VITALS — BP 134/82 | HR 71 | Resp 16 | Ht 61.0 in | Wt 138.0 lb

## 2024-05-03 DIAGNOSIS — Z0000 Encounter for general adult medical examination without abnormal findings: Secondary | ICD-10-CM

## 2024-05-03 DIAGNOSIS — M810 Age-related osteoporosis without current pathological fracture: Secondary | ICD-10-CM | POA: Diagnosis not present

## 2024-05-03 DIAGNOSIS — M79602 Pain in left arm: Secondary | ICD-10-CM | POA: Diagnosis not present

## 2024-05-03 DIAGNOSIS — I10 Essential (primary) hypertension: Secondary | ICD-10-CM | POA: Diagnosis not present

## 2024-05-03 DIAGNOSIS — E782 Mixed hyperlipidemia: Secondary | ICD-10-CM | POA: Diagnosis not present

## 2024-05-03 DIAGNOSIS — M25532 Pain in left wrist: Secondary | ICD-10-CM | POA: Diagnosis not present

## 2024-05-03 DIAGNOSIS — R739 Hyperglycemia, unspecified: Secondary | ICD-10-CM | POA: Diagnosis not present

## 2024-05-03 DIAGNOSIS — E559 Vitamin D deficiency, unspecified: Secondary | ICD-10-CM | POA: Diagnosis not present

## 2024-05-03 NOTE — Progress Notes (Signed)
 Subjective:    Patient ID: Tammy Grimes, female    DOB: 1946-03-07, 78 y.o.   MRN: [id number]  Chief Complaint  Patient presents with  . Annual Exam    Patient presents today for a physical exam.  . Quality Metric Gaps    AWV    HPI Discussed the use of AI scribe software for clinical note transcription with the patient, who gave verbal consent to proceed.  History of Present Illness Tammy Grimes is a 78 year old female with osteoporosis who presents for follow-up after a fall resulting in a wrist fracture.  She experienced a fall while cleaning the shower, triggered by being startled by her husband, resulting in a wrist fracture. She is undergoing physical therapy twice a week and has difficulty using her left hand, impacting her ability to write and perform daily activities. She notes some improvement, such as being able to tie her shoes.  She has a history of osteoporosis diagnosed in the femoral neck. Previous treatments with Fosamax and Actonel were not well tolerated. She is awaiting a follow-up bone density test in the fall.  She experiences generalized joint and muscle aches, particularly in the mornings, and has chronic knee pain that worsens with activity. Her knees feel stiff and swollen, and she occasionally wears braces for support, though they are becoming less effective. She takes acetaminophen  twice a day, which helps alleviate her pain.  She reports increased frequency and urgency of urination, which has been a recent development. She is trying to drink more fluids, which may contribute to this issue.  Her social history includes being retired from [employer] work and having a supportive husband who assists with daily activities. She recently returned from a trip to the beach and is in the process of selling her beach property.    Past Medical History:  Diagnosis Date  . Allergy   . Anemia    prior to hysterectomy  . BCC (basal cell carcinoma)  08/01/2015  . Breast cancer (HCC) 09/25/2018   right  . Cancer (HCC) 2007   calf of right leg- melanoma  . Cataract   . Cervical cancer screening 06/06/2012  . DDD (degenerative disc disease), cervical 08/01/2015  . Family history of bladder cancer   . Family history of breast cancer   . Family history of colon cancer   . Family history of ovarian cancer   . Family history of prostate cancer   . GERD (gastroesophageal reflux disease)   . Hyperglycemia 03/28/2017  . Hyperlipidemia   . Hypertension 65  . Hypothyroidism   . Knee pain 03/30/2012   L>R  . Low back pain 03/23/2018  . Melanoma of skin (HCC) 08/19/2016  . PONV (postoperative nausea and vomiting)   . Thyroid  disease 64  . Vitamin D  deficiency 06/06/2012    Past Surgical History:  Procedure Laterality Date  . ABDOMINAL HYSTERECTOMY  1990's   total, for heavy bleeding and fibroids  . BASAL CELL CARCINOMA EXCISION    . BREAST LUMPECTOMY WITH RADIOACTIVE SEED AND SENTINEL LYMPH NODE BIOPSY Right 11/07/2018   Procedure: RIGHT BREAST LUMPECTOMY WITH RADIOACTIVE SEED AND SENTINEL LYMPH NODE BIOPSY;  Surgeon: Aron Shoulders, MD;  Location: [facility];  Service: General;  Laterality: Right;  . BREAST SURGERY  early 70's   fibroid tumors removed- benign  . CATARACT EXTRACTION Bilateral 12/09/12  . COLONOSCOPY    . EYE SURGERY Bilateral 2015   cataracts  . MELANOMA EXCISION  2007    Family History  Problem Relation Age of Onset  . Hypertension Mother   . Alzheimer's disease Mother   . Dementia Mother        alzheimer's  . Aortic aneurysm Father   . Hypertension Father   . COPD Father        smoker  . Heart disease Father        s/p bypass, aortic aneurysm, carotid artery disease  . Breast cancer Sister 29  . Diabetes Son 22       type 1  . Alzheimer's disease Maternal Grandmother   . Prostate cancer Maternal Grandfather 60       metastatic  . Stroke Paternal Grandfather   . Ovarian cancer Maternal Aunt  75  . Cancer Maternal Aunt        cervical or uterine  . Colon cancer Paternal Uncle 10  . Breast cancer Cousin 55  . Breast cancer Cousin 55  . Colon cancer Cousin        dx >50  . Esophageal cancer Neg Hx   . Pancreatic cancer Neg Hx   . Rectal cancer Neg Hx   . Stomach cancer Neg Hx     Social History   Socioeconomic History  . Marital status: Married    Spouse name: Not on file  . Number of children: Not on file  . Years of education: Not on file  . Highest education level: Associate degree: occupational, Scientist, product/process development, or vocational program  Occupational History  . Not on file  Tobacco Use  . Smoking status: Never  . Smokeless tobacco: Never  Vaping Use  . Vaping status: Never Used  Substance and Sexual Activity  . Alcohol use: No  . Drug use: No  . Sexual activity: Yes    Comment: lives with husband, retired from [employer] work, no dietary restrictions  Other Topics Concern  . Not on file  Social History Narrative  . Not on file   Social Drivers of Health   Financial Resource Strain: Low Risk  (09/06/2023)   Overall Financial Resource Strain (CARDIA)   . Difficulty of Paying Living Expenses: Not hard at all  Food Insecurity: No Food Insecurity (09/06/2023)   Hunger Vital Sign   . Worried About Programme researcher, broadcasting/film/video in the Last Year: Never true   . Ran Out of Food in the Last Year: Never true  Transportation Needs: No Transportation Needs (09/06/2023)   PRAPARE - Transportation   . Lack of Transportation (Medical): No   . Lack of Transportation (Non-Medical): No  Physical Activity: Insufficiently Active (09/06/2023)   Exercise Vital Sign   . Days of Exercise per Week: 2 days   . Minutes of Exercise per Session: 30 min  Stress: No Stress Concern Present (09/06/2023)   [employer] of Occupational Health - Occupational Stress Questionnaire   . Feeling of Stress : Only a little  Social Connections: Socially Integrated (09/06/2023)   Social Connection  and Isolation Panel   . Frequency of Communication with Friends and Family: Twice a week   . Frequency of Social Gatherings with Friends and Family: More than three times a week   . Attends Religious Services: More than 4 times per year   . Active Member of Clubs or Organizations: Yes   . Attends Banker Meetings: More than 4 times per year   . Marital Status: Married  Catering manager Violence: Not At Risk (11/17/2021)   Humiliation, Afraid, Rape,  and Kick questionnaire   . Fear of Current or Ex-Partner: No   . Emotionally Abused: No   . Physically Abused: No   . Sexually Abused: No    Outpatient Medications Prior to Visit  Medication Sig Dispense Refill  . amLODipine  (NORVASC ) 5 MG tablet TAKE 1 TABLET BY MOUTH AT  BEDTIME 100 tablet 2  . aspirin 81 MG tablet Take 81 mg by mouth daily.    . calcium -vitamin D  250-100 MG-UNIT per tablet Take 1 tablet by mouth daily.    . celecoxib  (CELEBREX ) 100 MG capsule TAKE 1 CAPSULE(100 MG) BY MOUTH TWICE DAILY AS NEEDED 40 capsule 2  . cetirizine (ZYRTEC) 10 MG tablet Take 10 mg by mouth daily as needed for allergies.    . levothyroxine  (SYNTHROID ) 88 MCG tablet TAKE 1 TABLET BY MOUTH DAILY 100 tablet 2  . metoprolol  tartrate (LOPRESSOR ) 25 MG tablet Take 0.5 tablets (12.5 mg total) by mouth 2 (two) times daily. 100 tablet 1  . Multiple Vitamins-Minerals (CENTRUM SILVER 50+WOMEN) TABS Use as directed    . Omega-3 Fatty Acids (OMEGA 3 500) 500 MG CAPS Take 1 capsule by mouth daily.    . omeprazole  (PRILOSEC) 20 MG capsule TAKE 1 CAPSULE BY MOUTH DAILY 100 capsule 2  . rosuvastatin  (CRESTOR ) 5 MG tablet TAKE 1 TABLET BY MOUTH DAILY 100 tablet 2  . tiZANidine  (ZANAFLEX ) 2 MG tablet Take 0.5-2 tablets (1-4 mg total) by mouth every 8 (eight) hours as needed for muscle spasms. 40 tablet 2  . TURMERIC CURCUMIN PO Take 1,650 mg by mouth daily.    . HYDROcodone -acetaminophen  (NORCO/VICODIN) 5-325 MG tablet Take 1 tablet by mouth every 4 (four)  hours as needed. 10 tablet 0  . ondansetron  (ZOFRAN -ODT) 4 MG disintegrating tablet Take 1 tablet (4 mg total) by mouth every 8 (eight) hours as needed for nausea or vomiting. 20 tablet 0  . tamoxifen  (NOLVADEX ) 20 MG tablet TAKE 1 TABLET BY MOUTH DAILY 100 tablet 2   No facility-administered medications prior to visit.    Allergies  Allergen Reactions  . Sodium Bisphosphate [Sodium Phosphate ] Other (See Comments)    arthralgias  . Penicillins Rash    Review of Systems  Constitutional:  Negative for fever and malaise/fatigue.  HENT:  Negative for congestion.   Eyes:  Negative for blurred vision.  Respiratory:  Negative for shortness of breath.   Cardiovascular:  Negative for chest pain, palpitations and leg swelling.  Gastrointestinal:  Negative for abdominal pain, blood in stool and nausea.  Genitourinary:  Positive for frequency and urgency. Negative for dysuria.  Musculoskeletal:  Positive for falls, joint pain and myalgias.  Skin:  Negative for rash.  Neurological:  Negative for dizziness, loss of consciousness and headaches.  Endo/Heme/Allergies:  Negative for environmental allergies.  Psychiatric/Behavioral:  Negative for depression. The patient is not nervous/anxious.        Objective:    Physical Exam Constitutional:      General: She is not in acute distress.    Appearance: Normal appearance. She is well-developed. She is not toxic-appearing.  HENT:     Head: Normocephalic and atraumatic.     Right Ear: External ear normal.     Left Ear: External ear normal.     Nose: Nose normal.   Eyes:     General:        Right eye: No discharge.        Left eye: No discharge.     Conjunctiva/sclera: Conjunctivae normal.  Neck:     Thyroid : No thyromegaly.   Cardiovascular:     Rate and Rhythm: Normal rate and regular rhythm.     Heart sounds: Normal heart sounds. No murmur heard. Pulmonary:     Effort: Pulmonary effort is normal. No respiratory distress.      Breath sounds: Normal breath sounds.  Abdominal:     General: Bowel sounds are normal.     Palpations: Abdomen is soft.     Tenderness: There is no abdominal tenderness. There is no guarding.   Musculoskeletal:        General: Normal range of motion.     Cervical back: Neck supple.  Lymphadenopathy:     Cervical: No cervical adenopathy.   Skin:    General: Skin is warm and dry.   Neurological:     Mental Status: She is alert and oriented to person, place, and time.   Psychiatric:        Mood and Affect: Mood normal.        Behavior: Behavior normal.        Thought Content: Thought content normal.        Judgment: Judgment normal.    BP 134/82   Pulse 71   Resp 16   Ht 5' 1 (1.549 m)   Wt 138 lb (62.6 kg)   SpO2 98%   BMI 26.07 kg/m  Wt Readings from Last 3 Encounters:  05/03/24 138 lb (62.6 kg)  01/27/24 135 lb (61.2 kg)  10/10/23 136 lb 6.4 oz (61.9 kg)    Diabetic Foot Exam - Simple   No data filed    Lab Results  Component Value Date   WBC 5.8 09/08/2023   HGB 13.1 09/08/2023   HCT 41.6 09/08/2023   PLT 192.0 09/08/2023   GLUCOSE 107 (H) 09/08/2023   CHOL 140 09/08/2023   TRIG 134.0 09/08/2023   HDL 52.10 09/08/2023   LDLDIRECT 152.0 08/01/2015   LDLCALC 61 09/08/2023   ALT 58 (H) 09/08/2023   AST 51 (H) 09/08/2023   NA 144 09/08/2023   K 4.6 09/08/2023   CL 105 09/08/2023   CREATININE 0.67 09/08/2023   BUN 15 09/08/2023   CO2 27 09/08/2023   TSH 2.51 09/08/2023   HGBA1C 5.9 09/08/2023    Lab Results  Component Value Date   TSH 2.51 09/08/2023   Lab Results  Component Value Date   WBC 5.8 09/08/2023   HGB 13.1 09/08/2023   HCT 41.6 09/08/2023   MCV 96.9 09/08/2023   PLT 192.0 09/08/2023   Lab Results  Component Value Date   NA 144 09/08/2023   K 4.6 09/08/2023   CO2 27 09/08/2023   GLUCOSE 107 (H) 09/08/2023   BUN 15 09/08/2023   CREATININE 0.67 09/08/2023   BILITOT 0.5 09/08/2023   ALKPHOS 43 09/08/2023   AST 51 (H)  09/08/2023   ALT 58 (H) 09/08/2023   PROT 6.9 09/08/2023   ALBUMIN 4.7 09/08/2023   CALCIUM  9.7 09/08/2023   ANIONGAP 8 10/08/2022   GFR 85.28 09/08/2023   Lab Results  Component Value Date   CHOL 140 09/08/2023   Lab Results  Component Value Date   HDL 52.10 09/08/2023   Lab Results  Component Value Date   LDLCALC 61 09/08/2023   Lab Results  Component Value Date   TRIG 134.0 09/08/2023   Lab Results  Component Value Date   CHOLHDL 3 09/08/2023   Lab Results  Component Value Date   HGBA1C  5.9 09/08/2023       Assessment & Plan:  Mixed hyperlipidemia Assessment & Plan: Encourage heart healthy diet such as MIND or DASH diet, increase exercise, avoid trans fats, simple carbohydrates and processed foods, consider a krill or fish or flaxseed oil cap daily. Tolerating Rosuvastatin   Orders: -     Lipid panel; Future  Essential hypertension Assessment & Plan: Well controlled, no changes to meds. Encouraged heart healthy diet such as the DASH diet and exercise as tolerated.   Orders: -     CBC with Differential/Platelet; Future -     Comprehensive metabolic panel with GFR; Future -     TSH; Future  Hyperglycemia Assessment & Plan: hgba1c acceptable, minimize simple carbs. Increase exercise as tolerated.   Orders: -     Hemoglobin A1c; Future  Preventative health care Assessment & Plan: Patient encouraged to maintain heart healthy diet, regular exercise, adequate sleep. Consider daily probiotics. Take medications as prescribed. Labs ordered and reviewed. Given and reviewed copy of ACP documents from [employer] and encouraged to complete and return. Aged out of PAP. MGM 09/2023 repeat in 1 year. Dexa 09/2022. Colonoscopy has aged out   Vitamin D  deficiency Assessment & Plan: Supplement and monitor   Left arm pain Assessment & Plan: S/p radius fracture on left in March 2025, she started physical therapy today   Osteoporosis, unspecified  osteoporosis type, unspecified pathological fracture presence -     DG Bone Density; Future    Assessment and Plan Assessment & Plan Chronic Pain (Knee and Back) Chronic knee and back pain with stiffness and swelling, partially relieved by acetaminophen . Physical therapy ongoing. Discussed knee braces and chair yoga for stability and flexibility. - Continue physical therapy twice a week. - Consider chair yoga for strength and flexibility. - Use knee braces as needed for stability.  Osteoporosis Osteoporosis in femoral neck. Fosamax and Actonel not tolerated. Discussed Reclast and Prolia as alternatives. Awaiting DEXA scan for baseline before new therapy. - Order DEXA scan for November 2025 at [facility]. - Consider Prolia injection pending insurance approval after DEXA scan results.  Urinary Frequency and Urgency Increased urinary frequency and urgency. No prior urological evaluation. Discussed potential causes and Kegel exercises. - Perform Kegel exercises regularly. - Consider referral to urogynecologist if symptoms worsen.  General Health Maintenance Reviewed vaccination status. Recommended Prevnar 20 and RSV vaccinations due to increased respiratory risk. - Recommend Prevnar 20 vaccination at pharmacy. - Recommend RSV vaccination at pharmacy. - Tetanus booster due in 2027 unless injured.  Follow-up Plan for follow-up to monitor conditions and adjust treatment. - Schedule follow-up visit in six months with lab work prior. - Seek earlier evaluation if symptoms change.     Harlene Horton, MD

## 2024-05-03 NOTE — Patient Instructions (Addendum)
 Prevnar 20  Respiratory Syncitial Virus vaccine, Arexvy vaccine at pharmacy Tetanus 2027 unless injured  Preventive Care 65 Years and Older, Female Preventive care refers to lifestyle choices and visits with your health care provider that can promote health and wellness. Preventive care visits are also called wellness exams. What can I expect for my preventive care visit? Counseling Your health care provider may ask you questions about your: Medical history, including: Past medical problems. Family medical history. Pregnancy and menstrual history. History of falls. Current health, including: Memory and ability to understand (cognition). Emotional well-being. Home life and relationship well-being. Sexual activity and sexual health. Lifestyle, including: Alcohol, nicotine or tobacco, and drug use. Access to firearms. Diet, exercise, and sleep habits. Work and work Astronomer. Sunscreen use. Safety issues such as seatbelt and bike helmet use. Physical exam Your health care provider will check your: Height and weight. These may be used to calculate your BMI (body mass index). BMI is a measurement that tells if you are at a healthy weight. Waist circumference. This measures the distance around your waistline. This measurement also tells if you are at a healthy weight and may help predict your risk of certain diseases, such as type 2 diabetes and high blood pressure. Heart rate and blood pressure. Body temperature. Skin for abnormal spots. What immunizations do I need?  Vaccines are usually given at various ages, according to a schedule. Your health care provider will recommend vaccines for you based on your age, medical history, and lifestyle or other factors, such as travel or where you work. What tests do I need? Screening Your health care provider may recommend screening tests for certain conditions. This may include: Lipid and cholesterol levels. Hepatitis C test. Hepatitis  B test. HIV (human immunodeficiency virus) test. STI (sexually transmitted infection) testing, if you are at risk. Lung cancer screening. Colorectal cancer screening. Diabetes screening. This is done by checking your blood sugar (glucose) after you have not eaten for a while (fasting). Mammogram. Talk with your health care provider about how often you should have regular mammograms. BRCA-related cancer screening. This may be done if you have a family history of breast, ovarian, tubal, or peritoneal cancers. Bone density scan. This is done to screen for osteoporosis. Talk with your health care provider about your test results, treatment options, and if necessary, the need for more tests. Follow these instructions at home: Eating and drinking  Eat a diet that includes fresh fruits and vegetables, whole grains, lean protein, and low-fat dairy products. Limit your intake of foods with high amounts of sugar, saturated fats, and salt. Take vitamin and mineral supplements as recommended by your health care provider. Do not drink alcohol if your health care provider tells you not to drink. If you drink alcohol: Limit how much you have to 0-1 drink a day. Know how much alcohol is in your drink. In the [place] one drink equals one 12 oz bottle of beer (355 mL), one 5 oz glass of wine (148 mL), or one 1 oz glass of hard liquor (44 mL). Lifestyle Brush your teeth every morning and night with fluoride toothpaste. Floss one time each day. Exercise for at least 30 minutes 5 or more days each week. Do not use any products that contain nicotine or tobacco. These products include cigarettes, chewing tobacco, and vaping devices, such as e-cigarettes. If you need help quitting, ask your health care provider. Do not use drugs. If you are sexually active, practice safe sex. Use a condom  or other form of protection in order to prevent STIs. Take aspirin only as told by your health care provider. Make sure that you  understand how much to take and what form to take. Work with your health care provider to find out whether it is safe and beneficial for you to take aspirin daily. Ask your health care provider if you need to take a cholesterol-lowering medicine (statin). Find healthy ways to manage stress, such as: Meditation, yoga, or listening to music. Journaling. Talking to a trusted person. Spending time with friends and family. Minimize exposure to UV radiation to reduce your risk of skin cancer. Safety Always wear your seat belt while driving or riding in a vehicle. Do not drive: If you have been drinking alcohol. Do not ride with someone who has been drinking. When you are tired or distracted. While texting. If you have been using any mind-altering substances or drugs. Wear a helmet and other protective equipment during sports activities. If you have firearms in your house, make sure you follow all gun safety procedures. What's next? Visit your health care provider once a year for an annual wellness visit. Ask your health care provider how often you should have your eyes and teeth checked. Stay up to date on all vaccines. This information is not intended to replace advice given to you by your health care provider. Make sure you discuss any questions you have with your health care provider. Document Revised: 04/22/2021 Document Reviewed: 04/22/2021 Elsevier Patient Education  2024 [employer].

## 2024-05-03 NOTE — Assessment & Plan Note (Signed)
 S/p radius fracture on left in March 2025, she started physical therapy today

## 2024-05-08 DIAGNOSIS — Z85828 Personal history of other malignant neoplasm of skin: Secondary | ICD-10-CM | POA: Diagnosis not present

## 2024-05-08 DIAGNOSIS — D1801 Hemangioma of skin and subcutaneous tissue: Secondary | ICD-10-CM | POA: Diagnosis not present

## 2024-05-08 DIAGNOSIS — D2271 Melanocytic nevi of right lower limb, including hip: Secondary | ICD-10-CM | POA: Diagnosis not present

## 2024-05-08 DIAGNOSIS — L821 Other seborrheic keratosis: Secondary | ICD-10-CM | POA: Diagnosis not present

## 2024-05-09 DIAGNOSIS — M25532 Pain in left wrist: Secondary | ICD-10-CM | POA: Diagnosis not present

## 2024-05-11 DIAGNOSIS — M25532 Pain in left wrist: Secondary | ICD-10-CM | POA: Diagnosis not present

## 2024-05-16 DIAGNOSIS — M25532 Pain in left wrist: Secondary | ICD-10-CM | POA: Diagnosis not present

## 2024-05-18 DIAGNOSIS — M25532 Pain in left wrist: Secondary | ICD-10-CM | POA: Diagnosis not present

## 2024-05-22 DIAGNOSIS — M25532 Pain in left wrist: Secondary | ICD-10-CM | POA: Diagnosis not present

## 2024-05-23 ENCOUNTER — Telehealth (HOSPITAL_BASED_OUTPATIENT_CLINIC_OR_DEPARTMENT_OTHER): Payer: Self-pay

## 2024-05-23 DIAGNOSIS — M25532 Pain in left wrist: Secondary | ICD-10-CM | POA: Diagnosis not present

## 2024-05-31 DIAGNOSIS — M25532 Pain in left wrist: Secondary | ICD-10-CM | POA: Diagnosis not present

## 2024-06-01 DIAGNOSIS — M25532 Pain in left wrist: Secondary | ICD-10-CM | POA: Diagnosis not present

## 2024-06-04 DIAGNOSIS — M25532 Pain in left wrist: Secondary | ICD-10-CM | POA: Diagnosis not present

## 2024-06-06 DIAGNOSIS — M25532 Pain in left wrist: Secondary | ICD-10-CM | POA: Diagnosis not present

## 2024-06-13 DIAGNOSIS — M25532 Pain in left wrist: Secondary | ICD-10-CM | POA: Diagnosis not present

## 2024-06-15 DIAGNOSIS — M25532 Pain in left wrist: Secondary | ICD-10-CM | POA: Diagnosis not present

## 2024-06-18 DIAGNOSIS — M25532 Pain in left wrist: Secondary | ICD-10-CM | POA: Diagnosis not present

## 2024-06-20 ENCOUNTER — Encounter: Payer: Self-pay | Admitting: Family Medicine

## 2024-06-20 MED ORDER — TIZANIDINE HCL 2 MG PO TABS: 1.0000 mg | ORAL_TABLET | Freq: Three times a day (TID) | ORAL | 2 refills | Status: DC | PRN

## 2024-06-22 DIAGNOSIS — M25532 Pain in left wrist: Secondary | ICD-10-CM | POA: Diagnosis not present

## 2024-06-25 DIAGNOSIS — S52592P Other fractures of lower end of left radius, subsequent encounter for closed fracture with malunion: Secondary | ICD-10-CM | POA: Diagnosis not present

## 2024-06-25 DIAGNOSIS — M1812 Unilateral primary osteoarthritis of first carpometacarpal joint, left hand: Secondary | ICD-10-CM | POA: Diagnosis not present

## 2024-06-27 DIAGNOSIS — M25532 Pain in left wrist: Secondary | ICD-10-CM | POA: Diagnosis not present

## 2024-06-29 DIAGNOSIS — M25532 Pain in left wrist: Secondary | ICD-10-CM | POA: Diagnosis not present

## 2024-07-04 DIAGNOSIS — M25532 Pain in left wrist: Secondary | ICD-10-CM | POA: Diagnosis not present

## 2024-07-11 DIAGNOSIS — M25532 Pain in left wrist: Secondary | ICD-10-CM | POA: Diagnosis not present

## 2024-07-13 DIAGNOSIS — M25532 Pain in left wrist: Secondary | ICD-10-CM | POA: Diagnosis not present

## 2024-07-17 ENCOUNTER — Telehealth: Payer: Self-pay | Admitting: Family Medicine

## 2024-07-17 DIAGNOSIS — M25532 Pain in left wrist: Secondary | ICD-10-CM | POA: Diagnosis not present

## 2024-07-17 NOTE — Telephone Encounter (Signed)
 Copied from CRM #[id number]. Topic: Medicare AWV >> Jul 17, 2024  9:57 AM Nathanel DEL wrote: Reason for CRM: Called LVM 07/17/2024 to schedule AWV. Please schedule Virtual or Telehealth visits ONLY.   Nathanel Paschal; Care Guide Ambulatory Clinical Support [place] [facility] Direct Dial: [phone]

## 2024-08-01 DIAGNOSIS — M25532 Pain in left wrist: Secondary | ICD-10-CM | POA: Diagnosis not present

## 2024-08-03 DIAGNOSIS — M25532 Pain in left wrist: Secondary | ICD-10-CM | POA: Diagnosis not present

## 2024-08-08 DIAGNOSIS — M25532 Pain in left wrist: Secondary | ICD-10-CM | POA: Diagnosis not present

## 2024-08-09 DIAGNOSIS — M25532 Pain in left wrist: Secondary | ICD-10-CM | POA: Diagnosis not present

## 2024-08-10 DIAGNOSIS — M25532 Pain in left wrist: Secondary | ICD-10-CM | POA: Diagnosis not present

## 2024-08-13 DIAGNOSIS — M25532 Pain in left wrist: Secondary | ICD-10-CM | POA: Diagnosis not present

## 2024-08-15 DIAGNOSIS — M25532 Pain in left wrist: Secondary | ICD-10-CM | POA: Diagnosis not present

## 2024-08-27 ENCOUNTER — Other Ambulatory Visit: Payer: Self-pay | Admitting: Family Medicine

## 2024-09-21 LAB — HM MAMMOGRAPHY

## 2024-09-24 ENCOUNTER — Encounter: Payer: Self-pay | Admitting: Family Medicine

## 2024-09-25 LAB — DG IMAGING RESULTS - SCANNED

## 2024-10-06 ENCOUNTER — Other Ambulatory Visit: Payer: Self-pay | Admitting: Family Medicine

## 2024-10-09 ENCOUNTER — Inpatient Hospital Stay: Payer: Medicare Other | Attending: Hematology and Oncology | Admitting: Hematology and Oncology

## 2024-10-09 VITALS — BP 132/62 | HR 68 | Temp 97.3°F | Resp 17 | Wt 145.7 lb

## 2024-10-09 DIAGNOSIS — S62102A Fracture of unspecified carpal bone, left wrist, initial encounter for closed fracture: Secondary | ICD-10-CM | POA: Diagnosis not present

## 2024-10-09 DIAGNOSIS — Z803 Family history of malignant neoplasm of breast: Secondary | ICD-10-CM | POA: Diagnosis not present

## 2024-10-09 DIAGNOSIS — Z7982 Long term (current) use of aspirin: Secondary | ICD-10-CM | POA: Diagnosis not present

## 2024-10-09 DIAGNOSIS — K219 Gastro-esophageal reflux disease without esophagitis: Secondary | ICD-10-CM | POA: Diagnosis not present

## 2024-10-09 DIAGNOSIS — E785 Hyperlipidemia, unspecified: Secondary | ICD-10-CM | POA: Insufficient documentation

## 2024-10-09 DIAGNOSIS — M5030 Other cervical disc degeneration, unspecified cervical region: Secondary | ICD-10-CM | POA: Insufficient documentation

## 2024-10-09 DIAGNOSIS — Z800 Family history of malignant neoplasm of digestive organs: Secondary | ICD-10-CM | POA: Diagnosis not present

## 2024-10-09 DIAGNOSIS — C50511 Malignant neoplasm of lower-outer quadrant of right female breast: Secondary | ICD-10-CM

## 2024-10-09 DIAGNOSIS — Z1732 Human epidermal growth factor receptor 2 negative status: Secondary | ICD-10-CM | POA: Diagnosis not present

## 2024-10-09 DIAGNOSIS — Z85820 Personal history of malignant melanoma of skin: Secondary | ICD-10-CM | POA: Insufficient documentation

## 2024-10-09 DIAGNOSIS — Z1721 Progesterone receptor positive status: Secondary | ICD-10-CM | POA: Insufficient documentation

## 2024-10-09 DIAGNOSIS — M810 Age-related osteoporosis without current pathological fracture: Secondary | ICD-10-CM | POA: Insufficient documentation

## 2024-10-09 DIAGNOSIS — I10 Essential (primary) hypertension: Secondary | ICD-10-CM | POA: Insufficient documentation

## 2024-10-09 DIAGNOSIS — Z170 Estrogen receptor positive status [ER+]: Secondary | ICD-10-CM | POA: Insufficient documentation

## 2024-10-09 DIAGNOSIS — Z79810 Long term (current) use of selective estrogen receptor modulators (SERMs): Secondary | ICD-10-CM | POA: Insufficient documentation

## 2024-10-09 DIAGNOSIS — Z8042 Family history of malignant neoplasm of prostate: Secondary | ICD-10-CM | POA: Diagnosis not present

## 2024-10-09 DIAGNOSIS — Z85828 Personal history of other malignant neoplasm of skin: Secondary | ICD-10-CM | POA: Insufficient documentation

## 2024-10-09 DIAGNOSIS — Z79899 Other long term (current) drug therapy: Secondary | ICD-10-CM | POA: Diagnosis not present

## 2024-10-09 DIAGNOSIS — E039 Hypothyroidism, unspecified: Secondary | ICD-10-CM | POA: Insufficient documentation

## 2024-10-09 DIAGNOSIS — Z8041 Family history of malignant neoplasm of ovary: Secondary | ICD-10-CM | POA: Insufficient documentation

## 2024-10-09 DIAGNOSIS — Z8052 Family history of malignant neoplasm of bladder: Secondary | ICD-10-CM | POA: Insufficient documentation

## 2024-10-09 DIAGNOSIS — C50311 Malignant neoplasm of lower-inner quadrant of right female breast: Secondary | ICD-10-CM | POA: Diagnosis present

## 2024-10-09 NOTE — Progress Notes (Signed)
 [facility]  Telephone:(336) [phone] Fax:(336) [phone]    ID: Tammy Grimes DOB: 12/07/1945  MR#: [id number]  RDW#:[id number]  Patient Care Team: Domenica Harlene LABOR, MD as PCP - General (Family Medicine) Jordan, Amy, MD as Consulting Physician (Dermatology) Roz Anes, MD as Consulting Physician (Ophthalmology) Aron Shoulders, MD as Consulting Physician (General Surgery) Dewey Rush, MD as Consulting Physician (Radiation Oncology) Avram Lupita BRAVO, MD as Consulting Physician (Gastroenterology) Tyree Nanetta SAILOR, RN as Oncology Nurse Navigator Bonner Ade, MD as Consulting Physician (Physical Medicine and Rehabilitation) OTHER MD: Dr. Loree Daring, Dr. Aliene LITTIE Karon Mickey DDS PA   CHIEF COMPLAINT: Estrogen receptor positive breast cancer  CURRENT TREATMENT: observation   INTERVAL HISTORY:  She is here for a follow up. Since her last visit, she has been doing about the same. She had a mammogram and bone density, she however wonders why the bone density results mentioned there was no comparison when she had a bone density at the same place about 2 yrs ago. She also had a fracture of her left wrist, which she chose to proceed without surgery. She says it hasn't completely healed   COVID 19 VACCINATION STATUS: Pfizer x4, last 04/2021; infection 09/15/2021    HISTORY OF CURRENT ILLNESS: From the original intake note:  [name] had routine screening mammography on 09/11/2018 showing a possible abnormality in the right breast. She underwent right unilateral diagnostic mammography with tomography and right breast ultrasonography at [facility] on 09/21/2018 showing: Breast Density Category C. 1.3 cm irregular focal asymmetry in the right breast lower inner aspect anterior depth 4 cm from the nipple noted on the mammogram, but on the ultrasound this measured at [2.8] cm. The irregular mass is hypoechoic with posterior acoustic shadowing. Color flow imaging demonstrates increased  vascularity. Elastography imaging assessment is hard. No significant abnormalities were seen in the right axilla.   Accordingly on 09/28/2018 she proceeded to biopsy of the right breast area in question. The pathology from this procedure showed ([id number]): invasive ductal carcinoma, Grade III. Prognostic indicators significant for: estrogen receptor, 95% positive and progesterone receptor, 95% positive, both with strong staining intensity. Proliferation marker Ki67 at 15%. HER2 negative immunohistochemcal and morphometric analysis 1+.  The patient's subsequent history is as detailed below.   PAST MEDICAL HISTORY: Past Medical History:  Diagnosis Date   Allergy    Anemia    prior to hysterectomy   BCC (basal cell carcinoma) 08/01/2015   Breast cancer (HCC) 09/25/2018   right   Cancer (HCC) 2007   calf of right leg- melanoma   Cataract    Cervical cancer screening 06/06/2012   DDD (degenerative disc disease), cervical 08/01/2015   Family history of bladder cancer    Family history of breast cancer    Family history of colon cancer    Family history of ovarian cancer    Family history of prostate cancer    GERD (gastroesophageal reflux disease)    Hyperglycemia 03/28/2017   Hyperlipidemia    Hypertension 65   Hypothyroidism    Knee pain 03/30/2012   L>R   Low back pain 03/23/2018   Melanoma of skin (HCC) 08/19/2016   PONV (postoperative nausea and vomiting)    Thyroid  disease 64   Vitamin D  deficiency 06/06/2012    PAST SURGICAL HISTORY: Past Surgical History:  Procedure Laterality Date   ABDOMINAL HYSTERECTOMY  1990's   total, for heavy bleeding and fibroids   BASAL CELL CARCINOMA EXCISION     BREAST LUMPECTOMY WITH  RADIOACTIVE SEED AND SENTINEL LYMPH NODE BIOPSY Right 11/07/2018   Procedure: RIGHT BREAST LUMPECTOMY WITH RADIOACTIVE SEED AND SENTINEL LYMPH NODE BIOPSY;  Surgeon: Aron Shoulders, MD;  Location:  SURGERY CENTER;  Service: General;  Laterality: Right;    BREAST SURGERY  early 70's   fibroid tumors removed- benign   CATARACT EXTRACTION Bilateral 12/09/12   COLONOSCOPY     EYE SURGERY Bilateral 2015   cataracts   MELANOMA EXCISION  2007    FAMILY HISTORY: Family History  Problem Relation Age of Onset   Hypertension Mother    Alzheimer's disease Mother    Dementia Mother        alzheimer's   Aortic aneurysm Father    Hypertension Father    COPD Father        smoker   Heart disease Father        s/p bypass, aortic aneurysm, carotid artery disease   Breast cancer Sister 53   Diabetes Son 22       type 1   Alzheimer's disease Maternal Grandmother    Prostate cancer Maternal Grandfather 60       metastatic   Stroke Paternal Grandfather    Ovarian cancer Maternal Aunt 63   Cancer Maternal Aunt        cervical or uterine   Colon cancer Paternal Uncle 69   Breast cancer Cousin 55   Breast cancer Cousin 55   Colon cancer Cousin        dx >50   Esophageal cancer Neg Hx    Pancreatic cancer Neg Hx    Rectal cancer Neg Hx    Stomach cancer Neg Hx   Her father died from COPD at age 68. Patients' mother died from alzheimer's at age 25. The patient has no brothers, 1 sister. Her sister, Particia, had breast cancer at the age of 51.  Particia Rouse, is employed with the [employer].One of their maternal aunts had ovarian cancer, and a second maternal aunt had either uterine or cervical cancer. [name] had Melanoma in situ on her right calf.   GYNECOLOGIC HISTORY:  Menarche: 78 years old Age at first live birth: 78 years old GX P: 1 LMP: No LMP recorded. Patient has had a hysterectomy. 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 She worked with [employer] for 35 years before they closed, after which she became employed with [employer] until she retired. Her husband, [place], worked as a civil service fast streamer for an  clinical research associate.  He has now retired.  [name] has one son, Musician, age 15, who lives in [place] and is a Research Scientist (life Sciences) as well as a management consultant.  The patient's granddaughter recently graduated from [employer] with the summa cum laude in physical therapy and already has a great job.  She is planning to marry November 2023.   ADVANCED DIRECTIVES: Her husband, [place] is Cytogeneticist (MPOA). If [place] is unable, then her son, Ivonne, would assume MPOA.    HEALTH MAINTENANCE: Social History   Tobacco Use   Smoking status: Never   Smokeless tobacco: Never  Vaping Use   Vaping status: Never Used  Substance Use Topics   Alcohol use: No   Drug use: No    Colonoscopy: yes, 06/07/2017  PAP:   Bone density: yes, 07/25/2018  Allergies  Allergen Reactions   Sodium Bisphosphate [Sodium Phosphate ] Other (See Comments)    arthralgias   Penicillins Rash    Current Outpatient Medications  Medication Sig Dispense Refill   amLODipine  (NORVASC ) 5 MG tablet TAKE 1 TABLET BY MOUTH AT  BEDTIME 100 tablet 2   aspirin 81 MG tablet Take 81 mg by mouth daily.     calcium -vitamin D  250-100 MG-UNIT per tablet Take 1 tablet by mouth daily.     celecoxib  (CELEBREX ) 100 MG capsule TAKE 1 CAPSULE(100 MG) BY MOUTH TWICE DAILY AS NEEDED 40 capsule 2   cetirizine (ZYRTEC) 10 MG tablet Take 10 mg by mouth daily as needed for allergies.     levothyroxine  (SYNTHROID ) 88 MCG tablet TAKE 1 TABLET BY MOUTH DAILY 100 tablet 2   metoprolol  tartrate (LOPRESSOR ) 25 MG tablet TAKE ONE-HALF TABLET BY MOUTH  TWICE DAILY 100 tablet 2   Multiple Vitamins-Minerals (CENTRUM SILVER 50+WOMEN) TABS Use as directed     Omega-3 Fatty Acids (OMEGA 3 500) 500 MG CAPS Take 1 capsule by mouth daily.     omeprazole  (PRILOSEC) 20 MG capsule TAKE 1 CAPSULE BY MOUTH DAILY 100 capsule 2   rosuvastatin  (CRESTOR ) 5 MG tablet TAKE 1 TABLET BY MOUTH DAILY 100 tablet 2   tiZANidine   (ZANAFLEX ) 2 MG tablet Take 0.5-2 tablets (1-4 mg total) by mouth every 8 (eight) hours as needed for muscle spasms. 40 tablet 2   TURMERIC CURCUMIN PO Take 1,650 mg by mouth daily.     No current facility-administered medications for this visit.    OBJECTIVE: white woman in no acute distress  Vitals:   10/09/24 0957  BP: 132/62  Pulse: 68  Resp: 17  Temp: (!) 97.3 F (36.3 C)  SpO2: 97%      Body mass index is 27.53 kg/m.   Wt Readings from Last 3 Encounters:  10/09/24 145 lb 11.2 oz (66.1 kg)  05/03/24 138 lb (62.6 kg)  01/27/24 135 lb (61.2 kg)      ECOG FS:1 - Symptomatic but completely ambulatory  Sclerae unicteric, EOMs intact Wearing a mask No cervical or supraclavicular adenopathy Bilateral breasts with no obvious palpable masses or regional adenopathy   LAB RESULTS:  CMP     Component Value Date/Time   NA 144 09/08/2023 1105   K 4.6 09/08/2023 1105   CL 105 09/08/2023 1105   CO2 27 09/08/2023 1105   GLUCOSE 107 (H) 09/08/2023 1105   BUN 15 09/08/2023 1105   CREATININE 0.67 09/08/2023 1105   CREATININE 0.69 10/08/2022 1041   CREATININE 0.63 07/22/2020 1008   CALCIUM  9.7 09/08/2023 1105   PROT 6.9 09/08/2023 1105   ALBUMIN 4.7 09/08/2023 1105   AST 51 (H) 09/08/2023 1105   AST 24 10/08/2022 1041   ALT 58 (H) 09/08/2023 1105   ALT 27 10/08/2022 1041   ALKPHOS 43 09/08/2023 1105   BILITOT 0.5 09/08/2023 1105   BILITOT 0.4 10/08/2022 1041   GFRNONAA >60 10/08/2022 1041   GFRAA >60 09/18/2019 1340   GFRAA >60 05/22/2019 1213    Lab Results  Component Value Date   WBC 5.8 09/08/2023   NEUTROABS 3.5 09/08/2023   HGB 13.1 09/08/2023   HCT 41.6 09/08/2023   MCV 96.9 09/08/2023   PLT 192.0 09/08/2023   No results found for: LABCA2  No components found for: [id number]  No results for input(s): INR in the last 168 hours.  No results found for: LABCA2  No results found for: CAN199  No results found for: CAN125  No results found  for: CAN153  No results found for: CA2729  No components found for: HGQUANT  No results found for: CEA1, CEA / No results found for: CEA1, CEA   No results found for: AFPTUMOR  No results found for: CHROMOGRNA  No results found for: TOTALPROTELP, ALBUMINELP, A1GS, A2GS, BETS, BETA2SER, GAMS, MSPIKE, SPEI (this displays SPEP labs)  No results found for: KPAFRELGTCHN, LAMBDASER, KAPLAMBRATIO (kappa/lambda light chains)  No results found for: HGBA, HGBA2QUANT, HGBFQUANT, HGBSQUAN (Hemoglobinopathy evaluation)   No results found for: LDH  No results found for: IRON, TIBC, IRONPCTSAT (Iron and TIBC)  No results found for: FERRITIN  Urinalysis    Component Value Date/Time   COLORURINE YELLOW 03/03/2023 1107   APPEARANCEUR Cloudy (A) 03/03/2023 1107   LABSPEC 1.025 03/03/2023 1107   PHURINE 6.0 03/03/2023 1107   GLUCOSEU NEGATIVE 03/03/2023 1107   HGBUR NEGATIVE 03/03/2023 1107   BILIRUBINUR NEGATIVE 03/03/2023 1107   KETONESUR NEGATIVE 03/03/2023 1107   UROBILINOGEN 0.2 03/03/2023 1107   NITRITE POSITIVE (A) 03/03/2023 1107   LEUKOCYTESUR TRACE (A) 03/03/2023 1107     STUDIES:  No results found.  Personal History Of Breast Cancer Personal history of treated right breast carcinoma, status post right lumpectomy 2019 Lower Inner Quadrant.  Digital breast tomosynthesis imaging has been obtained as part of this examination. Comparison is made to exam(s) dated: 09/13/2019, 09/15/2020. Breast Composition Category C: The breast tissue is heterogeneously dense, which may obscure small masses.  Current study was also evaluated with a Computer Aided Detection (CAD) system. Post surgical distortion and density at the lumpectomy site in the right upper outer breast are unchanged. Benign post operative findings are seen in the left breast. No significant masses, calcifications, or other findings are seen in  either breast.  IMPRESSION: BENIGN Stable right lumpectomy. There is no mammographic evidence of malignancy. Routine mammographic evaluation in 1 year is recommended.(09/11/2022)  This exam was interpreted at the [facility] location.  Dorothe Ruth M.D. sl/penrad:09/10/2021 10:43:17    ELIGIBLE FOR AVAILABLE RESEARCH PROTOCOL: no   ASSESSMENT: 78 y.o. [place]  woman status post right breast lower inner quadrant biopsy 09/27/2018 for a clinical T1c N0 invasive ductal carcinoma, grade 3, estrogen and progesterone receptor positive, HER-2 not amplified, with an MIB-1 of 15%  (1) Genetic testing performed through [facility] Common Hereditary Cancers panel + Melanoma panel on 10/11/2018 showed no pathogenic mutations in APC, ATM, AXIN2, BAP1, BARD1, BMPR1A, BRCA1, BRCA2, BRIP1, BUB1B, CDH1, CDK4, CDKN2A, CHEK2, CTNNA1, DICER1, ENG, EPCAM, GALNT12, GREM1, HOXB13, KIT, MEN1, MITF, MLH1, MLH3, MSH2, MSH3, MSH6, MUTYH, NBN, NF1, NTHL1, PALB2, PDGFRA, PMS2, POLD1, POLE, POT1 PTEN, RAD50, RAD51C, RAD51D, RB1, RNF43, RPS20, SDHA, SDHB, SDHC, SDHD, SMAD4, SMARCA4, STK11, TP53, TSC1, TSC2, VHL.  (2) status post right lumpectomy and sentinel lymph node sampling 11/07/2018 for a pT2 pN0, stage IIA, grade 3 with a positive anterior margin  (a) a total of 7 lymph nodes were removed  (3) The Oncotype DX score was 10 predicting a risk of outside the breast recurrence over the next 9 years of 3% if the patient's only systemic therapy is tamoxifen  for 5 years. It also predicts <1% benefit from chemotherapy.  (4) adjuvant radiation 12/14/2018 - 01/11/2019  (a) Right breast / 42.56 Gy in 16 fractions  (b) Seroma boost / 8 Gy in 4 fractions  (5) started tamoxifen  02/07/2019  (a) D[facility]A scan at [facility] 07/25/2018 showed a T score of -2.0  (b) repeat DEXA  scan at [facility] 09/15/2020 shows a T score of -1.8   PLAN:  History of Breast Cancer She completed antiestrogen therapy. No concern for  recurrence on ROS or PE today Mammogram Nov 2025 neg for malignancy.  Osteoporosis Noted on recent bone density scan. -Continue weight-bearing exercises as tolerated. -Continue Vitamin D  and Calcium  supplementation. - I discussed about bisphosphonates, she is not interested, she said she tried taking fosamax many yrs ago and she didn't tolerate it well.  FU in 1 yr or sooner as needed.  Total time spent: 30 min  *Total Encounter Time as defined by the Centers for Medicare and Medicaid Services includes, in addition to the face-to-face time of a patient visit (documented in the note above) non-face-to-face time: obtaining and reviewing outside history, ordering and reviewing medications, tests or procedures, care coordination (communications with other health care professionals or caregivers) and documentation in the medical record.

## 2024-10-29 ENCOUNTER — Other Ambulatory Visit

## 2024-10-29 DIAGNOSIS — R739 Hyperglycemia, unspecified: Secondary | ICD-10-CM | POA: Diagnosis not present

## 2024-10-29 DIAGNOSIS — E782 Mixed hyperlipidemia: Secondary | ICD-10-CM | POA: Diagnosis not present

## 2024-10-29 DIAGNOSIS — I10 Essential (primary) hypertension: Secondary | ICD-10-CM | POA: Diagnosis not present

## 2024-10-29 LAB — LIPID PANEL
Cholesterol: 113 mg/dL (ref 28–200)
HDL: 46.9 mg/dL
LDL Cholesterol: 39 mg/dL (ref 10–99)
NonHDL: 66.58
Total CHOL/HDL Ratio: 2
Triglycerides: 137 mg/dL (ref 10.0–149.0)
VLDL: 27.4 mg/dL (ref 0.0–40.0)

## 2024-10-29 LAB — CBC WITH DIFFERENTIAL/PLATELET
Basophils Absolute: 0 K/uL (ref 0.0–0.1)
Basophils Relative: 0.8 % (ref 0.0–3.0)
Eosinophils Absolute: 0.2 K/uL (ref 0.0–0.7)
Eosinophils Relative: 3.2 % (ref 0.0–5.0)
HCT: 37.4 % (ref 36.0–46.0)
Hemoglobin: 12.5 g/dL (ref 12.0–15.0)
Lymphocytes Relative: 26.4 % (ref 12.0–46.0)
Lymphs Abs: 1.6 K/uL (ref 0.7–4.0)
MCHC: 33.5 g/dL (ref 30.0–36.0)
MCV: 92.7 fl (ref 78.0–100.0)
Monocytes Absolute: 0.5 K/uL (ref 0.1–1.0)
Monocytes Relative: 8.1 % (ref 3.0–12.0)
Neutro Abs: 3.6 K/uL (ref 1.4–7.7)
Neutrophils Relative %: 61.5 % (ref 43.0–77.0)
Platelets: 210 K/uL (ref 150.0–400.0)
RBC: 4.03 Mil/uL (ref 3.87–5.11)
RDW: 13.7 % (ref 11.5–15.5)
WBC: 5.9 K/uL (ref 4.0–10.5)

## 2024-10-29 LAB — COMPREHENSIVE METABOLIC PANEL WITH GFR
ALT: 23 U/L (ref 3–35)
AST: 22 U/L (ref 5–37)
Albumin: 4.7 g/dL (ref 3.5–5.2)
Alkaline Phosphatase: 58 U/L (ref 39–117)
BUN: 16 mg/dL (ref 6–23)
CO2: 25 meq/L (ref 19–32)
Calcium: 9.3 mg/dL (ref 8.4–10.5)
Chloride: 107 meq/L (ref 96–112)
Creatinine, Ser: 0.61 mg/dL (ref 0.40–1.20)
GFR: 86.53 mL/min
Glucose, Bld: 128 mg/dL — ABNORMAL HIGH (ref 70–99)
Potassium: 4 meq/L (ref 3.5–5.1)
Sodium: 143 meq/L (ref 135–145)
Total Bilirubin: 0.4 mg/dL (ref 0.2–1.2)
Total Protein: 6.6 g/dL (ref 6.0–8.3)

## 2024-10-29 LAB — HEMOGLOBIN A1C: Hgb A1c MFr Bld: 6.1 % (ref 4.6–6.5)

## 2024-10-29 LAB — TSH: TSH: 3.35 u[IU]/mL (ref 0.35–5.50)

## 2024-10-30 ENCOUNTER — Ambulatory Visit: Payer: Self-pay | Admitting: Family Medicine

## 2024-10-30 NOTE — Progress Notes (Signed)
Pt reviewed via MyChart.

## 2024-11-04 NOTE — Assessment & Plan Note (Signed)
 On Levothyroxine, continue to monitor

## 2024-11-04 NOTE — Assessment & Plan Note (Signed)
 Encourage heart healthy diet such as MIND or DASH diet, increase exercise, avoid trans fats, simple carbohydrates and processed foods, consider a krill or fish or flaxseed oil cap daily. Tolerating Rosuvastatin

## 2024-11-04 NOTE — Assessment & Plan Note (Signed)
 [id number] acceptable, minimize simple carbs. Increase exercise as tolerated.

## 2024-11-04 NOTE — Progress Notes (Unsigned)
 "  Subjective:    Patient ID: Tammy Grimes, female    DOB: 11/08/1946, 78 y.o.   MRN: [id number]  No chief complaint on file.   HPI Discussed the use of AI scribe software for clinical note transcription with the patient, who gave verbal consent to proceed.  History of Present Illness Tammy Grimes is a 78 year old female with a history of arthritis who presents with chronic joint and muscle pain.  She experiences chronic pain in her joints and muscles, primarily in her knees and back, which has been worsening over time. The pain is most pronounced in the morning upon waking and after prolonged immobility. Exercise provides some relief, but the pain persists. She takes Tylenol , which alleviates the pain slightly, but she is frustrated by its persistence.  Her knee pain is exacerbated by a history of kneecaps slipping out of place, for which she wears braces. She has been told she has arthritis in her knees. She previously used meloxicam , which was effective, but she is cautious about long-term use. Celebrex  is used as an alternative when Tylenol  is insufficient.  In addition to knee and back pain, she reports worsening pain in her hands since a wrist fracture in March of the previous year. She is unable to write as before and cannot make a fist with her left hand. Pain is noted when bending her fingers, particularly affecting her thumbs, attributed to arthritis.  She experiences occasional swelling in her knees and ankles, especially when wearing braces. Her shoulder pain is noted, particularly following the wrist fracture.  Her recent blood work indicates prediabetes with a hemoglobin A1c of 6.1. No new breathing trouble, chest pain, or changes in bowel or urinary habits.    Past Medical History:  Diagnosis Date   Allergy    Anemia    prior to hysterectomy   BCC (basal cell carcinoma) 08/01/2015   Breast cancer (HCC) 09/25/2018   right   Cancer (HCC) 2007   calf of right  leg- melanoma   Cataract    Cervical cancer screening 06/06/2012   DDD (degenerative disc disease), cervical 08/01/2015   Family history of bladder cancer    Family history of breast cancer    Family history of colon cancer    Family history of ovarian cancer    Family history of prostate cancer    GERD (gastroesophageal reflux disease)    Hyperglycemia 03/28/2017   Hyperlipidemia    Hypertension 65   Hypothyroidism    Knee pain 03/30/2012   L>R   Low back pain 03/23/2018   Melanoma of skin (HCC) 08/19/2016   PONV (postoperative nausea and vomiting)    Thyroid  disease 64   Vitamin D  deficiency 06/06/2012    Past Surgical History:  Procedure Laterality Date   ABDOMINAL HYSTERECTOMY  1990's   total, for heavy bleeding and fibroids   BASAL CELL CARCINOMA EXCISION     BREAST LUMPECTOMY WITH RADIOACTIVE SEED AND SENTINEL LYMPH NODE BIOPSY Right 11/07/2018   Procedure: RIGHT BREAST LUMPECTOMY WITH RADIOACTIVE SEED AND SENTINEL LYMPH NODE BIOPSY;  Surgeon: Aron Shoulders, MD;  Location: [facility];  Service: General;  Laterality: Right;   BREAST SURGERY  early 70's   fibroid tumors removed- benign   CATARACT EXTRACTION Bilateral 12/09/12   COLONOSCOPY     EYE SURGERY Bilateral 2015   cataracts   MELANOMA EXCISION  2007    Family History  Problem Relation Age of Onset   Hypertension  Mother    Alzheimer's disease Mother    Dementia Mother        alzheimer's   Aortic aneurysm Father    Hypertension Father    COPD Father        smoker   Heart disease Father        s/p bypass, aortic aneurysm, carotid artery disease   Breast cancer Sister 56   Diabetes Son 2       type 1   Alzheimer's disease Maternal Grandmother    Prostate cancer Maternal Grandfather 63       metastatic   Stroke Paternal Grandfather    Ovarian cancer Maternal Aunt 53   Cancer Maternal Aunt        cervical or uterine   Colon cancer Paternal Uncle 37   Breast cancer Cousin 66   Breast cancer  Cousin 61   Colon cancer Cousin        dx >50   Esophageal cancer Neg Hx    Pancreatic cancer Neg Hx    Rectal cancer Neg Hx    Stomach cancer Neg Hx     Social History   Socioeconomic History   Marital status: Married    Spouse name: Not on file   Number of children: Not on file   Years of education: Not on file   Highest education level: Associate degree: occupational, scientist, product/process development, or vocational program  Occupational History   Not on file  Tobacco Use   Smoking status: Never   Smokeless tobacco: Never  Vaping Use   Vaping status: Never Used  Substance and Sexual Activity   Alcohol use: No   Drug use: No   Sexual activity: Yes    Comment: lives with husband, retired from banker resources work, no dietary restrictions  Other Topics Concern   Not on file  Social History Narrative   Not on file   Social Drivers of Health   Tobacco Use: Low Risk (09/17/2024)   Received from [facility]   Patient History    Smoking Tobacco Use: Never    Smokeless Tobacco Use: Never    Passive Exposure: Not on file  Financial Resource Strain: Low Risk (11/04/2024)   Overall Financial Resource Strain (CARDIA)    Difficulty of Paying Living Expenses: Not hard at all  Food Insecurity: No Food Insecurity (11/04/2024)   Epic    Worried About Radiation Protection Practitioner of Food in the Last Year: Never true    Ran Out of Food in the Last Year: Never true  Transportation Needs: No Transportation Needs (11/04/2024)   Epic    Lack of Transportation (Medical): No    Lack of Transportation (Non-Medical): No  Physical Activity: Unknown (11/04/2024)   Exercise Vital Sign    Days of Exercise per Week: Patient declined    Minutes of Exercise per Session: Not on file  Stress: Patient Declined (11/04/2024)   [employer] of Occupational Health - Occupational Stress Questionnaire    Feeling of Stress: Patient declined  Social Connections: Socially Integrated (11/04/2024)   Social Connection and Isolation  Panel    Frequency of Communication with Friends and Family: Twice a week    Frequency of Social Gatherings with Friends and Family: More than three times a week    Attends Religious Services: More than 4 times per year    Active Member of [employer] or Organizations: Yes    Attends Banker Meetings: More than 4 times per year    Marital Status:  Married  Intimate Partner Violence: Not At Risk (11/17/2021)   Humiliation, Afraid, Rape, and Kick questionnaire    Fear of Current or Ex-Partner: No    Emotionally Abused: No    Physically Abused: No    Sexually Abused: No  Depression (PHQ2-9): Low Risk (05/03/2024)   Depression (PHQ2-9)    PHQ-2 Score: 0  Alcohol Screen: Low Risk (11/17/2021)   Alcohol Screen    Last Alcohol Screening Score (AUDIT): 0  Housing: Unknown (11/04/2024)   Epic    Unable to Pay for Housing in the Last Year: No    Number of Times Moved in the Last Year: Not on file    Homeless in the Last Year: No  Utilities: Not on file  Health Literacy: Not on file    Outpatient Medications Prior to Visit  Medication Sig Dispense Refill   amLODipine  (NORVASC ) 5 MG tablet TAKE 1 TABLET BY MOUTH AT  BEDTIME 100 tablet 2   aspirin 81 MG tablet Take 81 mg by mouth daily.     calcium -vitamin D  250-100 MG-UNIT per tablet Take 1 tablet by mouth daily.     celecoxib  (CELEBREX ) 100 MG capsule TAKE 1 CAPSULE(100 MG) BY MOUTH TWICE DAILY AS NEEDED 40 capsule 2   cetirizine (ZYRTEC) 10 MG tablet Take 10 mg by mouth daily as needed for allergies.     levothyroxine  (SYNTHROID ) 88 MCG tablet TAKE 1 TABLET BY MOUTH DAILY 100 tablet 2   metoprolol  tartrate (LOPRESSOR ) 25 MG tablet TAKE ONE-HALF TABLET BY MOUTH  TWICE DAILY 100 tablet 2   Multiple Vitamins-Minerals (CENTRUM SILVER 50+WOMEN) TABS Use as directed     Omega-3 Fatty Acids (OMEGA 3 500) 500 MG CAPS Take 1 capsule by mouth daily.     omeprazole  (PRILOSEC) 20 MG capsule TAKE 1 CAPSULE BY MOUTH DAILY 100 capsule 2    rosuvastatin  (CRESTOR ) 5 MG tablet TAKE 1 TABLET BY MOUTH DAILY 100 tablet 2   tiZANidine  (ZANAFLEX ) 2 MG tablet Take 0.5-2 tablets (1-4 mg total) by mouth every 8 (eight) hours as needed for muscle spasms. 40 tablet 2   TURMERIC CURCUMIN PO Take 1,650 mg by mouth daily.     No facility-administered medications prior to visit.    Allergies[1]  Review of Systems  Constitutional:  Positive for malaise/fatigue. Negative for fever.  HENT:  Negative for congestion.   Eyes:  Negative for blurred vision.  Respiratory:  Negative for shortness of breath.   Cardiovascular:  Positive for orthopnea and leg swelling. Negative for chest pain and palpitations.  Gastrointestinal:  Negative for abdominal pain, blood in stool and nausea.  Genitourinary:  Negative for dysuria and frequency.  Musculoskeletal:  Positive for back pain, joint pain and myalgias. Negative for falls.  Skin:  Negative for rash.  Neurological:  Negative for dizziness, loss of consciousness and headaches.  Endo/Heme/Allergies:  Negative for environmental allergies.  Psychiatric/Behavioral:  Negative for depression. The patient is not nervous/anxious.        Objective:    Physical Exam Constitutional:      General: She is not in acute distress.    Appearance: Normal appearance. She is well-developed. She is not toxic-appearing.  HENT:     Head: Normocephalic and atraumatic.     Right Ear: External ear normal.     Left Ear: External ear normal.     Nose: Nose normal.  Eyes:     General:        Right eye: No discharge.  Left eye: No discharge.     Conjunctiva/sclera: Conjunctivae normal.  Neck:     Thyroid : No thyromegaly.  Cardiovascular:     Rate and Rhythm: Normal rate and regular rhythm.     Heart sounds: Normal heart sounds. No murmur heard. Pulmonary:     Effort: Pulmonary effort is normal. No respiratory distress.     Breath sounds: Normal breath sounds.  Abdominal:     General: Bowel sounds are  normal.     Palpations: Abdomen is soft.     Tenderness: There is no abdominal tenderness. There is no guarding.  Musculoskeletal:        General: Normal range of motion.     Cervical back: Neck supple.  Lymphadenopathy:     Cervical: No cervical adenopathy.  Skin:    General: Skin is warm and dry.  Neurological:     Mental Status: She is alert and oriented to person, place, and time.  Psychiatric:        Mood and Affect: Mood normal.        Behavior: Behavior normal.        Thought Content: Thought content normal.        Judgment: Judgment normal.    There were no vitals taken for this visit. Wt Readings from Last 3 Encounters:  10/09/24 145 lb 11.2 oz (66.1 kg)  05/03/24 138 lb (62.6 kg)  01/27/24 135 lb (61.2 kg)    Diabetic Foot Exam - Simple   No data filed    Lab Results  Component Value Date   WBC 5.9 10/29/2024   HGB 12.5 10/29/2024   HCT 37.4 10/29/2024   PLT 210.0 10/29/2024   GLUCOSE 128 (H) 10/29/2024   CHOL 113 10/29/2024   TRIG 137.0 10/29/2024   HDL 46.90 10/29/2024   LDLDIRECT 152.0 08/01/2015   LDLCALC 39 10/29/2024   ALT 23 10/29/2024   AST 22 10/29/2024   NA 143 10/29/2024   K 4.0 10/29/2024   CL 107 10/29/2024   CREATININE 0.61 10/29/2024   BUN 16 10/29/2024   CO2 25 10/29/2024   TSH 3.35 10/29/2024   HGBA1C 6.1 10/29/2024    Lab Results  Component Value Date   TSH 3.35 10/29/2024   Lab Results  Component Value Date   WBC 5.9 10/29/2024   HGB 12.5 10/29/2024   HCT 37.4 10/29/2024   MCV 92.7 10/29/2024   PLT 210.0 10/29/2024   Lab Results  Component Value Date   NA 143 10/29/2024   K 4.0 10/29/2024   CO2 25 10/29/2024   GLUCOSE 128 (H) 10/29/2024   BUN 16 10/29/2024   CREATININE 0.61 10/29/2024   BILITOT 0.4 10/29/2024   ALKPHOS 58 10/29/2024   AST 22 10/29/2024   ALT 23 10/29/2024   PROT 6.6 10/29/2024   ALBUMIN 4.7 10/29/2024   CALCIUM  9.3 10/29/2024   ANIONGAP 8 10/08/2022   GFR 86.53 10/29/2024   Lab Results   Component Value Date   CHOL 113 10/29/2024   Lab Results  Component Value Date   HDL 46.90 10/29/2024   Lab Results  Component Value Date   LDLCALC 39 10/29/2024   Lab Results  Component Value Date   TRIG 137.0 10/29/2024   Lab Results  Component Value Date   CHOLHDL 2 10/29/2024   Lab Results  Component Value Date   HGBA1C 6.1 10/29/2024       Assessment & Plan:  Vitamin D  deficiency Assessment & Plan: Supplement and monitor  Osteoporosis without current pathological fracture, unspecified osteoporosis type Assessment & Plan: Encouraged to get adequate exercise, calcium  and vitamin d  intake   Hypothyroidism, unspecified type Assessment & Plan: On Levothyroxine , continue to monitor   Mixed hyperlipidemia Assessment & Plan: Encourage heart healthy diet such as MIND or DASH diet, increase exercise, avoid trans fats, simple carbohydrates and processed foods, consider a krill or fish or flaxseed oil cap daily. Tolerating Rosuvastatin    Hyperglycemia Assessment & Plan: hgba1c acceptable, minimize simple carbs. Increase exercise as tolerated.      Assessment and Plan Assessment & Plan Osteoarthritis with chronic knee, back, and hand pain Chronic osteoarthritis with pain in knees, back, and hands, exacerbated by previous wrist fracture. Pain is persistent, worsens in the morning and after immobility. Previous meloxicam  use was beneficial. Differential includes inflammatory arthritis, but current symptoms align with osteoarthritis. Imaging and blood work considered to assess progression and rule out inflammatory arthritis. Discussed risks of NSAIDs, including gastrointestinal, renal, and cardiovascular effects, and the importance of using them sparingly. - Ordered knee x-rays to assess for progression of osteoarthritis. - Prescribed meloxicam  7.5 mg daily as needed for pain management. - Advised use of Tylenol  as primary pain management, with meloxicam  as  needed. - Discussed potential referral to sports medicine for non-surgical interventions if needed. - Encouraged continuation of physical therapy exercises. - Discussed risks of NSAIDs and advised use with food and not daily.  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 Considering podiatric evaluation for potential surgical intervention. - Referred to podiatrist Dr. Alona for evaluation and management of onychomychosis.  Hyperglycemia (prediabetes) Hemoglobin A1c is 6.1, indicating prediabetes. Discussed lifestyle modifications to manage blood sugar levels. - Advised on lifestyle modifications including increased protein intake, reduced carbohydrate intake, and increased physical activity. - Instructed to monitor for symptoms of hyperglycemia such as increased urination, thirst, fatigue, and abdominal discomfort.  Mixed hyperlipidemia Cholesterol levels are well-controlled with current treatment regimen. - Continue current lipid-lowering therapy.  General Health Maintenance Discussed importance of vaccinations and lifestyle modifications to prevent cognitive decline. - Encouraged RSV vaccination at pharmacy. - Advised on hydration and low inflammation diet. - Recommended cognitive exercises to prevent cognitive decline.  Recording duration: 28 minutes     Harlene Horton, MD     [1]  Allergies Allergen Reactions   Sodium Bisphosphate [Sodium Phosphate ] Other (See Comments)    arthralgias   Penicillins Rash   "

## 2024-11-04 NOTE — Assessment & Plan Note (Signed)
 Encouraged to get adequate exercise, calcium and vitamin d intake

## 2024-11-04 NOTE — Assessment & Plan Note (Signed)
 Supplement and monitor

## 2024-11-05 ENCOUNTER — Ambulatory Visit: Admitting: Family Medicine

## 2024-11-05 ENCOUNTER — Other Ambulatory Visit (HOSPITAL_BASED_OUTPATIENT_CLINIC_OR_DEPARTMENT_OTHER): Payer: Self-pay

## 2024-11-05 ENCOUNTER — Encounter: Payer: Self-pay | Admitting: Family Medicine

## 2024-11-05 VITALS — BP 138/86 | HR 85 | Temp 97.7°F | Resp 16 | Ht 61.5 in | Wt 146.0 lb

## 2024-11-05 DIAGNOSIS — R739 Hyperglycemia, unspecified: Secondary | ICD-10-CM

## 2024-11-05 DIAGNOSIS — E039 Hypothyroidism, unspecified: Secondary | ICD-10-CM | POA: Diagnosis not present

## 2024-11-05 DIAGNOSIS — E559 Vitamin D deficiency, unspecified: Secondary | ICD-10-CM

## 2024-11-05 DIAGNOSIS — L602 Onychogryphosis: Secondary | ICD-10-CM

## 2024-11-05 DIAGNOSIS — M25562 Pain in left knee: Secondary | ICD-10-CM | POA: Diagnosis not present

## 2024-11-05 DIAGNOSIS — M810 Age-related osteoporosis without current pathological fracture: Secondary | ICD-10-CM | POA: Diagnosis not present

## 2024-11-05 DIAGNOSIS — M25561 Pain in right knee: Secondary | ICD-10-CM | POA: Diagnosis not present

## 2024-11-05 DIAGNOSIS — E782 Mixed hyperlipidemia: Secondary | ICD-10-CM

## 2024-11-05 MED ORDER — TIZANIDINE HCL 2 MG PO TABS: 1.0000 mg | ORAL_TABLET | Freq: Three times a day (TID) | ORAL | 0 refills | Status: AC | PRN

## 2024-11-05 MED ORDER — TIZANIDINE HCL 2 MG PO TABS
1.0000 mg | ORAL_TABLET | Freq: Three times a day (TID) | ORAL | 0 refills | Status: DC | PRN
  Filled 2024-11-05: qty 40, 7d supply, fill #0

## 2024-11-05 MED ORDER — MELOXICAM 7.5 MG PO TABS: 7.5000 mg | ORAL_TABLET | Freq: Every day | ORAL | 5 refills | Status: DC | PRN

## 2024-11-05 NOTE — Patient Instructions (Addendum)
 RSV vaccine at pharmacy at [facility], walk in vaccine clinics M-F then 9-4  Tetanus in 2027 or sooner if injured  Chronic Knee Pain, Adult Knee pain that lasts longer than 3 months is called chronic knee pain. You may have pain in one or both knees. Symptoms of chronic knee pain may also include swelling and stiffness. Many conditions can cause chronic knee pain. The most common cause is wear and tear of your knee joint as you get older. Other possible causes include: A disease that causes inflammation of the knee, such as rheumatoid arthritis. This usually affects both knees. A condition called inflammatory arthritis, such as gout. An injury to the knee that causes arthritis. An injury to the knee that damages the ligaments. Ligaments are tissues that connect bones to each other. Runner's knee or pain behind the kneecap. Treatment for chronic knee pain depends on the cause. The main treatments for chronic knee pain are: Doing exercises to help your knee move better and get stronger, called physical therapy. Losing weight if you are overweight. This condition may also be treated with medicines, injections, a knee sleeve or brace, and by using crutches. You health care provider may also recommend rest, ice, pressure (compression), and elevation, also called RICE therapy. Follow these instructions at home: If you have a knee sleeve or brace that can be taken off:  Wear the knee sleeve or brace as told by your provider. Take it off only if your provider says that you can. Check the skin around it every day. Tell your provider if you see problems. Loosen the knee sleeve or brace if your toes tingle, are numb, or turn cold and blue. Keep the knee sleeve or brace clean and dry. Bathing If the knee sleeve or brace is not waterproof: Do not let it get wet. Cover it when you take a bath or shower. Use a cover that does not let any water in. Managing pain, stiffness, and swelling     If told, put  heat on the area. Do this as often as told. Use the heat source that your provider recommends, such as a moist heat pack or a heating pad. If you have a knee sleeve or brace that you can take off, remove it as told. Place a towel between your skin and the heat source. Leave the heat on for 20-30 minutes. If told, put ice on the area. If you have a knee sleeve or brace that you can take off, remove it as told. Put ice in a plastic bag. Place a towel between your skin and the bag. Leave the ice on for 20 minutes, 2-3 times a day. If your skin turns bright red, remove the ice or heat right away to prevent skin damage. The risk of damage is higher if you cannot feel pain, heat, or cold. Move your toes often to reduce stiffness and swelling. Raise the injured area above the level of your heart while you are sitting or lying down. Use a pillow to support your foot as needed. Activity Avoid activities where both feet leave the ground at the same time. Avoid running, jumping rope, and doing jumping jacks. Follow the exercise plan that your provider made for you. Your provider may suggest that you: Avoid activities that make knee pain worse. This may mean that you need to change your exercise routines, sports, or job duties. Wear shoes with cushioned soles. Avoid sports that require running and sudden changes in direction. Do physical therapy.  Physical therapy helps your knee move better and get stronger. Exercise as told. Do exercises that increase balance and strength, such as tai chi and yoga. Do not stand or walk on your injured knee until you're told it's okay. Use crutches as told. Return to normal activities when you're told. Ask what things are safe for you to do. General instructions Take your medicines only as told by your provider. If you are overweight, work with your provider and an expert in healthy eating called a dietitian to set goals to lose weight. Losing even a little weight can  reduce knee pain. Being overweight can make your knee hurt more. Do not smoke, vape, or use products with nicotine or tobacco in them. If you need help quitting, talk with your provider. Keep all follow-up visits. Your provider will monitor your pain and try other treatments if needed. Contact a health care provider if: You have knee pain that is not getting better or gets worse. You are not able to do your exercises due to knee pain. Get help right away if: Your knee swells and the swelling becomes worse. You cannot move your knee. You have severe knee pain. This information is not intended to replace advice given to you by your health care provider. Make sure you discuss any questions you have with your health care provider. Document Revised: 07/28/2023 Document Reviewed: 12/20/2022 Elsevier Patient Education  2024 [employer].

## 2024-11-12 ENCOUNTER — Ambulatory Visit: Admitting: Podiatry

## 2024-11-12 ENCOUNTER — Encounter: Payer: Self-pay | Admitting: Podiatry

## 2024-11-12 DIAGNOSIS — B351 Tinea unguium: Secondary | ICD-10-CM | POA: Diagnosis not present

## 2024-11-12 NOTE — Patient Instructions (Signed)
 I have sent the nail for culture. If you don't hear from us  in 2 weeks, please let us  know.

## 2024-11-12 NOTE — Progress Notes (Unsigned)
 Yeas aut over the lst ew months hurting

## 2024-11-19 ENCOUNTER — Other Ambulatory Visit: Payer: Self-pay | Admitting: Family Medicine

## 2024-11-21 ENCOUNTER — Ambulatory Visit: Payer: Self-pay | Admitting: Podiatry

## 2024-11-21 ENCOUNTER — Other Ambulatory Visit: Payer: Self-pay | Admitting: Podiatry

## 2024-11-21 MED ORDER — TRIAMCINOLONE ACETONIDE 0.1 % EX CREA: 1.0000 | TOPICAL_CREAM | Freq: Two times a day (BID) | CUTANEOUS | 1 refills | Status: DC

## 2024-11-22 ENCOUNTER — Other Ambulatory Visit: Payer: Self-pay | Admitting: Podiatry

## 2024-11-22 ENCOUNTER — Other Ambulatory Visit (HOSPITAL_BASED_OUTPATIENT_CLINIC_OR_DEPARTMENT_OTHER): Payer: Self-pay

## 2024-11-22 MED ORDER — TRIAMCINOLONE ACETONIDE 0.1 % EX CREA
1.0000 | TOPICAL_CREAM | Freq: Two times a day (BID) | CUTANEOUS | 1 refills | Status: AC
  Filled 2024-11-22: qty 30, 15d supply, fill #0

## 2024-11-23 ENCOUNTER — Other Ambulatory Visit (HOSPITAL_BASED_OUTPATIENT_CLINIC_OR_DEPARTMENT_OTHER): Payer: Self-pay

## 2024-12-05 ENCOUNTER — Other Ambulatory Visit: Payer: Self-pay | Admitting: Family Medicine

## 2024-12-05 ENCOUNTER — Encounter: Payer: Self-pay | Admitting: Family Medicine

## 2024-12-05 MED ORDER — MELOXICAM 7.5 MG PO TABS
7.5000 mg | ORAL_TABLET | Freq: Every day | ORAL | 3 refills | Status: AC | PRN
  Filled 2024-12-05: qty 90, 90d supply, fill #0

## 2024-12-06 ENCOUNTER — Other Ambulatory Visit (HOSPITAL_COMMUNITY): Payer: Self-pay

## 2024-12-07 ENCOUNTER — Other Ambulatory Visit (HOSPITAL_COMMUNITY): Payer: Self-pay

## 2024-12-07 ENCOUNTER — Other Ambulatory Visit: Payer: Self-pay

## 2025-06-06 ENCOUNTER — Encounter: Admitting: Family Medicine

## 2025-10-08 ENCOUNTER — Inpatient Hospital Stay: Admitting: Hematology and Oncology
